# Patient Record
Sex: Female | Born: 1965
Health system: Southern US, Community
[De-identification: ages and names within clinical notes are randomized; demographics above are authoritative.]

## PROBLEM LIST (undated history)

## (undated) ENCOUNTER — Emergency Department (HOSPITAL_COMMUNITY): Admission: EM | Disposition: A | Discharge status: Other Acute Inpatient Hospital | Payer: Self-pay

## (undated) DIAGNOSIS — I619 Nontraumatic intracerebral hemorrhage, unspecified: Secondary | ICD-10-CM

## (undated) DIAGNOSIS — I2699 Other pulmonary embolism without acute cor pulmonale: Secondary | ICD-10-CM

## (undated) DIAGNOSIS — I10 Essential (primary) hypertension: Secondary | ICD-10-CM

## (undated) DIAGNOSIS — R51 Headache: Secondary | ICD-10-CM

## (undated) DIAGNOSIS — B370 Candidal stomatitis: Secondary | ICD-10-CM

## (undated) DIAGNOSIS — B3781 Candidal esophagitis: Principal | ICD-10-CM

## (undated) DIAGNOSIS — M1990 Unspecified osteoarthritis, unspecified site: Secondary | ICD-10-CM

## (undated) DIAGNOSIS — C9100 Acute lymphoblastic leukemia not having achieved remission: Secondary | ICD-10-CM

## (undated) DIAGNOSIS — J189 Pneumonia, unspecified organism: Secondary | ICD-10-CM

## (undated) DIAGNOSIS — C9590 Leukemia, unspecified not having achieved remission: Secondary | ICD-10-CM

## (undated) DIAGNOSIS — E119 Type 2 diabetes mellitus without complications: Secondary | ICD-10-CM

## (undated) DIAGNOSIS — D649 Anemia, unspecified: Secondary | ICD-10-CM

## (undated) DIAGNOSIS — L982 Febrile neutrophilic dermatosis [Sweet]: Secondary | ICD-10-CM

## (undated) HISTORY — DX: Candidal stomatitis: B37.0

## (undated) HISTORY — PX: OTHER SURGICAL HISTORY: SHX169

## (undated) HISTORY — PX: BONE MARROW TRANSPLANT: SHX200

## (undated) HISTORY — DX: Candidal esophagitis: B37.81

## (undated) HISTORY — DX: Type 2 diabetes mellitus without complications: E11.9

## (undated) HISTORY — DX: Febrile neutrophilic dermatosis (sweet): L98.2

## (undated) HISTORY — PX: FOOT SURGERY: SHX648

---

## 1998-05-08 ENCOUNTER — Inpatient Hospital Stay (HOSPITAL_COMMUNITY): Admission: AD | Admit: 1998-05-08 | Discharge: 1998-05-08 | Payer: Self-pay | Admitting: Obstetrics and Gynecology

## 1998-05-12 ENCOUNTER — Ambulatory Visit (HOSPITAL_COMMUNITY): Admission: RE | Admit: 1998-05-12 | Discharge: 1998-05-12 | Payer: Self-pay | Admitting: Obstetrics and Gynecology

## 1998-06-09 ENCOUNTER — Other Ambulatory Visit: Admission: RE | Admit: 1998-06-09 | Discharge: 1998-06-09 | Payer: Self-pay | Admitting: Obstetrics and Gynecology

## 1998-08-05 ENCOUNTER — Encounter: Payer: Self-pay | Admitting: Obstetrics and Gynecology

## 1998-08-05 ENCOUNTER — Ambulatory Visit (HOSPITAL_COMMUNITY): Admission: RE | Admit: 1998-08-05 | Discharge: 1998-08-05 | Payer: Self-pay | Admitting: Obstetrics and Gynecology

## 1998-10-05 ENCOUNTER — Inpatient Hospital Stay (HOSPITAL_COMMUNITY): Admission: AD | Admit: 1998-10-05 | Discharge: 1998-10-05 | Payer: Self-pay | Admitting: Obstetrics and Gynecology

## 1998-11-18 ENCOUNTER — Other Ambulatory Visit: Admission: RE | Admit: 1998-11-18 | Discharge: 1998-11-18 | Payer: Self-pay | Admitting: Obstetrics and Gynecology

## 1998-12-28 ENCOUNTER — Inpatient Hospital Stay (HOSPITAL_COMMUNITY): Admission: AD | Admit: 1998-12-28 | Discharge: 1998-12-31 | Payer: Self-pay | Admitting: *Deleted

## 1999-02-17 ENCOUNTER — Other Ambulatory Visit: Admission: RE | Admit: 1999-02-17 | Discharge: 1999-02-17 | Payer: Self-pay | Admitting: Obstetrics & Gynecology

## 2000-03-31 ENCOUNTER — Encounter: Payer: Self-pay | Admitting: Emergency Medicine

## 2000-03-31 ENCOUNTER — Emergency Department (HOSPITAL_COMMUNITY): Admission: EM | Admit: 2000-03-31 | Discharge: 2000-03-31 | Payer: Self-pay | Admitting: Emergency Medicine

## 2000-07-14 ENCOUNTER — Emergency Department (HOSPITAL_COMMUNITY): Admission: EM | Admit: 2000-07-14 | Discharge: 2000-07-14 | Payer: Self-pay

## 2001-07-09 ENCOUNTER — Other Ambulatory Visit: Admission: RE | Admit: 2001-07-09 | Discharge: 2001-07-09 | Payer: Self-pay | Admitting: Obstetrics and Gynecology

## 2001-07-24 ENCOUNTER — Encounter: Payer: Self-pay | Admitting: Obstetrics and Gynecology

## 2001-07-24 ENCOUNTER — Ambulatory Visit (HOSPITAL_COMMUNITY): Admission: RE | Admit: 2001-07-24 | Discharge: 2001-07-24 | Payer: Self-pay | Admitting: Obstetrics and Gynecology

## 2003-01-31 HISTORY — PX: EYE SURGERY: SHX253

## 2003-03-31 ENCOUNTER — Emergency Department (HOSPITAL_COMMUNITY): Admission: AD | Admit: 2003-03-31 | Discharge: 2003-03-31 | Payer: Self-pay | Admitting: Family Medicine

## 2003-04-03 ENCOUNTER — Ambulatory Visit (HOSPITAL_BASED_OUTPATIENT_CLINIC_OR_DEPARTMENT_OTHER): Admission: RE | Admit: 2003-04-03 | Discharge: 2003-04-03 | Payer: Self-pay | Admitting: Ophthalmology

## 2003-04-14 ENCOUNTER — Other Ambulatory Visit: Admission: RE | Admit: 2003-04-14 | Discharge: 2003-04-14 | Payer: Self-pay | Admitting: Obstetrics and Gynecology

## 2004-09-19 ENCOUNTER — Other Ambulatory Visit: Admission: RE | Admit: 2004-09-19 | Discharge: 2004-09-19 | Payer: Self-pay | Admitting: Obstetrics and Gynecology

## 2004-09-21 ENCOUNTER — Ambulatory Visit (HOSPITAL_COMMUNITY): Admission: RE | Admit: 2004-09-21 | Discharge: 2004-09-21 | Payer: Self-pay | Admitting: Obstetrics and Gynecology

## 2005-05-22 ENCOUNTER — Emergency Department (HOSPITAL_COMMUNITY): Admission: EM | Admit: 2005-05-22 | Discharge: 2005-05-22 | Payer: Self-pay | Admitting: Family Medicine

## 2005-05-24 ENCOUNTER — Emergency Department (HOSPITAL_COMMUNITY): Admission: EM | Admit: 2005-05-24 | Discharge: 2005-05-24 | Payer: Self-pay | Admitting: Emergency Medicine

## 2005-09-20 ENCOUNTER — Ambulatory Visit (HOSPITAL_COMMUNITY): Admission: RE | Admit: 2005-09-20 | Discharge: 2005-09-20 | Payer: Self-pay | Admitting: Orthopedic Surgery

## 2005-10-04 ENCOUNTER — Ambulatory Visit (HOSPITAL_BASED_OUTPATIENT_CLINIC_OR_DEPARTMENT_OTHER): Admission: RE | Admit: 2005-10-04 | Discharge: 2005-10-04 | Payer: Self-pay | Admitting: Orthopedic Surgery

## 2005-12-12 ENCOUNTER — Other Ambulatory Visit: Admission: RE | Admit: 2005-12-12 | Discharge: 2005-12-12 | Payer: Self-pay | Admitting: Obstetrics and Gynecology

## 2005-12-13 ENCOUNTER — Ambulatory Visit (HOSPITAL_COMMUNITY): Admission: RE | Admit: 2005-12-13 | Discharge: 2005-12-13 | Payer: Self-pay | Admitting: Obstetrics and Gynecology

## 2006-02-05 ENCOUNTER — Ambulatory Visit (HOSPITAL_COMMUNITY): Admission: RE | Admit: 2006-02-05 | Discharge: 2006-02-05 | Payer: Self-pay | Admitting: Gastroenterology

## 2006-09-17 ENCOUNTER — Emergency Department (HOSPITAL_COMMUNITY): Admission: EM | Admit: 2006-09-17 | Discharge: 2006-09-17 | Payer: Self-pay | Admitting: Emergency Medicine

## 2007-03-06 ENCOUNTER — Ambulatory Visit (HOSPITAL_COMMUNITY): Admission: RE | Admit: 2007-03-06 | Discharge: 2007-03-06 | Payer: Self-pay | Admitting: Obstetrics and Gynecology

## 2007-04-30 ENCOUNTER — Emergency Department (HOSPITAL_COMMUNITY): Admission: EM | Admit: 2007-04-30 | Discharge: 2007-04-30 | Payer: Self-pay | Admitting: Family Medicine

## 2007-11-29 ENCOUNTER — Ambulatory Visit (HOSPITAL_COMMUNITY): Admission: RE | Admit: 2007-11-29 | Discharge: 2007-11-29 | Payer: Self-pay | Admitting: Cardiology

## 2007-12-02 ENCOUNTER — Ambulatory Visit (HOSPITAL_COMMUNITY): Admission: RE | Admit: 2007-12-02 | Discharge: 2007-12-02 | Payer: Self-pay | Admitting: Cardiology

## 2008-02-12 ENCOUNTER — Emergency Department (HOSPITAL_COMMUNITY): Admission: EM | Admit: 2008-02-12 | Discharge: 2008-02-12 | Payer: Self-pay | Admitting: Family Medicine

## 2009-09-27 ENCOUNTER — Emergency Department (HOSPITAL_COMMUNITY): Admission: EM | Admit: 2009-09-27 | Discharge: 2009-09-27 | Payer: Self-pay | Admitting: Family Medicine

## 2010-06-17 NOTE — Op Note (Signed)
[facility] [place] [facility]  Patient:    Lynn [name]                       MRN: [id number] Proc. Date: 12/28/98 Adm. Date:  [id number] Attending:  Pleas Koch                           Operative Report  PREOPERATIVE DIAGNOSIS:       Persistent late decellerations.  POSTOPERATIVE DIAGNOSIS:      Persistent late decellerations.  OPERATION:                    Low transverse cesarean section.  SURGEON:                      Cecilio Asper, [name]  ASSISTANT:                    Miguel Dibble, C.N.M.  ANESTHESIA:                   Epidural.  ESTIMATED BLOOD LOSS:         800 cc.  URINE OUTPUT:                 350 cc.  FINDINGS:                     A viable female infant, Apgars 9 and 9, weighing 9 pounds.  Normal tubes and ovaries.  COMPLICATIONS:                Small extension of the uterine incision into the lower uterine segment.  INDICATIONS:                  The patient is a 45 year old, gravida 2, para 1, ho presented with spontaneous rupture of membranes.  The patient was augmented with Pitocin.  She reached dilatation of 7 cm and the fetal heart rate tracing showed persistent late decellerations which did not resolve with amnioinfusion, oxygen, changing maternal position, or increasing IV fluids.  The patient was therefore  consented for cesarean delivery.  DESCRIPTION OF PROCEDURE:     After adequate level of epidural anesthesia was obtained, the patient was prepped and draped in the usual sterile fashion. Foley catheter had been previously placed in the bladder to drain it of urine.  A low  transverse incision was made down through the subcutaneous fat to the fascia and the fascia was incised on either side of the midline.  This incision was carried laterally in both directions with the Mayo scissors.  Kocher clamps were placed on the superior edge of the incision and the fascia was removed from the rectus muscles both  superiorly and inferiorly.  The parietoperitoneum was identified by blunt dissection and elevated with hemostats.  It was incised and extended with  blunt dissection.  The bladder piece was placed inside of the incision.  The visceroperitoneum of the lower uterine segment was incised in order to develop  bladder flap.  The bladder piece was placed inside of this flap.  The uterus was entered in a low transverse cesarean fashion and extended with the bandage scissors.  The infants head was elevated and with assistance from the vacuum, it was delivered.  The infant was suctioned on the operative field.  Anterior and posterior shoulders as well as the rest of the body  were then delivered.  The cord was doubly clamped and cut and the infant was taken to the warmer where he was cared for by the nursing team.  Cord bloods were obtained.  The placenta was manually extracted.  The uterus was wiped clean with a wet lap sponge.  Ring clamps were placed on the uterine incision.  There was noted to be an extension toward the left lower uterine segment of the incision, it was reapproximated with a running suture of #0 Vicryl and a second imbricating layer was used with good hemostatic result.  The uterine incision was then reapproximated with a running suture of 0 Vicryl and imbricated in like fashion.  There were a few sinus bleeders of the usx that were made hemostatic with figure-of-eight sutures of #0 Vicryl. Hemostasis was then assured.  The pelvis was copiously irrigated.  Tubes and ovaries were noted to be normal.  The pyramidalis muscles were reapproximated in the midline  with a figure-of-eight suture of #1 Vicryl. The fascia was then reapproximated rom the lateral edge to the midline with a running suture of #1 Vicryl.  Skin staples were applied.  Bandage was applied.  Sponge, needle, and instrument counts were  correct x 2.  The patient tolerated the procedure well.  She was  taken to the recovery room in stable condition. DD:  12/28/98 TD:  12/29/98 Job: [id number]

## 2010-06-17 NOTE — Op Note (Signed)
NAME:  Lynn [name], Lynn [name] NO.:  [id number]   MEDICAL RECORD NO.:  [id number]          PATIENT TYPE:  AMB   LOCATION:  DSC                          FACILITY:  [facility]   PHYSICIAN:  Dyke Brackett, [name]    DATE OF BIRTH:  28-Jun-1965   DATE OF PROCEDURE:  10/04/2005  DATE OF DISCHARGE:                                 OPERATIVE REPORT   PREOPERATIVE DIAGNOSIS:  Osteochondritis, left ankle.   POSTOPERATIVE DIAGNOSIS:  Osteochondritis, left ankle.   OPERATIONS:  1. Synovectomy.  2. Removal of osteochondral fragment.  3. Drilling/microfracture of left ankle.   SURGEON:  Dyke Brackett, [name]   ASSISTANT:  Legrand Pitts. Duffy, P.A.   TOURNIQUET TIME:  45 minutes.   INDICATIONS:  The patient is a 45 year old female with a painful ankle after  a sprain for several months.  Conservative treatment did not improve her  situation.  She had an MRI, which showed an osteochondritis along the medial  portion of the talus that needed surgery, thought to be accomplishable as an  outpatient.   DESCRIPTION OF PROCEDURE:  Leg holder on the calf, with exsanguination of  the leg and inflation of the thigh tourniquet to 375, portals were created  anteromedially and anterolaterally.  There was probably a 5 x 5-mm  osteochondral fragment, which was loose.  It did not have any subchondral  bone attached to it; therefore, it was debrided, and then the base of the  lesion, which really did involve more articular cartilage than the  subchondral bone, was microfractured with the microfracture instrument.  There was mild nonspecific synovitis surrounding the lesion.  The lesion was  probably in the midportion of the talus on the medial side, but I would say  that over 50% of it was nonweightbearing, abutting the medial malleolus.  The portals were closed.  The joint and portals were infiltrated with  Marcaine, and then a compressive dressing and a boot.  Taken to the recovery  room in stable  condition.  The tourniquet was released after application of  the dressing, before application of the boot.      Dyke Brackett, [name]  Electronically Signed     WDC/MEDQ  D:  10/04/2005  T:  10/04/2005  Job:  [id number]

## 2010-06-17 NOTE — Op Note (Signed)
NAME:  Lynn [name], Lynn [name] NO.:  [id number]   MEDICAL RECORD NO.:  [id number]          PATIENT TYPE:  AMB   LOCATION:  ENDO                         FACILITY:  [facility]   PHYSICIAN:  Shirley Friar, MDDATE OF BIRTH:  1965/06/24   DATE OF PROCEDURE:  DATE OF DISCHARGE:                               OPERATIVE REPORT   PROCEDURE:  Colonoscopy.   INDICATIONS FOR PROCEDURE:  Screening, family history of colon cancer in  her mother the age 46.   MEDICATIONS:  Fentanyl 100 mcg IV, Versed 8 mg IV.   FINDINGS:  Rectal exam was normal.  A pediatric colonoscope was inserted  into an adequately prepped colon and advanced to the cecum where the  ileocecal valve and appendiceal orifice were identified.  Careful  withdrawal of the colonoscope revealed no mucosal abnormalities,  including no polyps or diverticula.  Retroflexion revealed small  internal hemorrhoids.   ASSESSMENT:  Small internal hemorrhoids; otherwise, normal colonoscopy.   PLAN:  1. Repeat colonoscopy in 5 years due to family history of colon      cancer.  2. High-fiber diet.      Shirley Friar, MD  Electronically Signed     VCS/MEDQ  D:  02/05/2006  T:  02/05/2006  Job:  [id number]   cc:   Hal Morales, [name]

## 2010-06-17 NOTE — Op Note (Signed)
NAME:  Lynn [name], Lynn [name]                         ACCOUNT NO.:  [id number]   MEDICAL RECORD NO.:  [id number]                   PATIENT TYPE:  AMB   LOCATION:  DSC                                  FACILITY:  [facility]   PHYSICIAN:  Pasty Spillers. Maple Hudson, [name]              DATE OF BIRTH:  [birth year]   DATE OF PROCEDURE:  04/03/2003  DATE OF DISCHARGE:                                 OPERATIVE REPORT   PREOPERATIVE DIAGNOSIS:  Exotropia, status post eye muscle surgery on the  left eye.   POSTOPERATIVE DIAGNOSIS:  Exotropia, status post eye muscle surgery on the  left eye.   PROCEDURE:  1. Right lateral rectus muscle recession, 7.5 mm.  2. Right medial rectus muscle resection, 7.5 mm.   SURGEON:  Pasty Spillers. Maple Hudson, [name]   ANESTHESIA:  General, (laryngeal mask).   COMPLICATIONS:  None.   DESCRIPTION OF PROCEDURE:  After routine preoperative evaluation including  informed consent, the patient was taken to the operating room where she was  identified by me.  General anesthesia was induced without difficulty after  placement of appropriate monitors.  The patient was prepped and draped in a  standard sterile fashion.  The lid speculum was placed in the right eye.   Through an inferotemporal fornix incision through conjunctiva and tenons  fascia, the right lateral rectus muscle was engaged on a series of muscle  hooks and cleared of its fascial attachments.  The tendon was secured with a  double-armed 6-0 Vicryl suture, the double locking bite at each border of  the muscle, 1 mm from the insertion.  The muscle was dysinserted, and was  reattached to the sclera at a measured distance of 7.5 mm posterior to the  original incision, using direct scleral vastus in crossed swords fashion.  The suture ends were tied securely after the position of the muscle had been  checked and found to be adequate.  The conjunctiva was closed with two  interrupted 6-0 plain gut sutures.   Through an inferonasal  fornix incision, the conjunctiva and tenons were  fashioned where the right medial and rectus muscle was engaged and cleared  as described for the lateral, except that the clearing of the fascia was  more extensive and proceeded farther posterior for the medial.  The muscle  was spread between two self-retaining hooks.  A 2 mm bite was taken of the  center of the muscle belly with a double-armed 6-0 Vicryl suture, and a knot  was tied securely at this location.  The needle at each end of the double-  armed suture was passed from the center of the muscle belly to the  periphery, parallel to an 7.5 mm posterior to the insertion.  A double  locking bite was placed on the muscle, a double locking bite was placed at  each border of the muscle.  A resection clamp was placed on the muscle just  anterior to these sutures.  The muscle was dysinserted.  Each pole suture  was passed posteriorly to anteriorly through the periphery of the muscle  stump, then anteriorly with the posterior needle in the center of the stump,  then posteriorly to anteriorly through the center of the muscle belly, just  posterior to the previously placed knot.  The muscle was drawn up to the  level of the original insertion, and all slack was removed before these  suture ends were tied securely.  The clamp was removed, and the portion of  the muscle anterior to the sutures was carefully  excised.  The conjunctiva was closed with two interrupted 6-0 plain gut  sutures.  TobraDex ointment was placed in the eye.  The patient was awakened  without difficulty and taken to the recovery room in stable condition,  having suffered no intraoperative or immediate postoperative complications.                                               Pasty Spillers. Maple Hudson, [name]    Cheron Schaumann  D:  04/03/2003  T:  04/04/2003  Job:  [id number]

## 2010-06-17 NOTE — Op Note (Signed)
NAME:  TARYNE, KIGER NO.:  [id number]   MEDICAL RECORD NO.:  [id number]          PATIENT TYPE:  AMB   LOCATION:  DSC                          FACILITY:  [facility]   PHYSICIAN:  Dyke Brackett, [name]    DATE OF BIRTH:  [birth year]   DATE OF PROCEDURE:  10/04/2005  DATE OF DISCHARGE:  10/04/2005                               OPERATIVE REPORT   INDICATIONS:  The patient had intractable pain after an ankle sprain and  was noted on MRI to have osteochondritis thought to be amenable to  outpatient surgery of the ankle.   PREOPERATIVE DIAGNOSIS:  Osteochondritis, anteromedial talus with  synovitis.   POSTOPERATIVE DIAGNOSIS:  Osteochondritis, anteromedial talus with  synovitis.   OPERATION:  Debridement and synovectomy, left ankle, with excision of  osteochondral fragment and a microfracture.   SURGEON:  Dyke Brackett, [name]   ASSISTANT:  PA.   DESCRIPTION OF THE PROCEDURE:  Sterile prep and drape.  Exsanguination  of the leg and placement to 375 thigh tourniquet.  A holder was used on  the calf.  Anteromedial and anterolateral portals were created.  Systematic inspection of the ankle showed synovitis that was most  noticeable over the anteromedial aspect of ankle.  This was overlying a  loose osteochondral fracture fragment that probably measured 5 x 7 mm  involving some of the dome of the talus around the curve.  Certainly  probably half the lesion was not on the weightbearing surface.  This was  a loose fragment requiring debridement.  The bed was punctured with  several areas for a microfracture technique as well.  No other  abnormalities noted.  Ankle drained free from fluid, forced closed.  Placed in a large compressive sterile dressing and splint and the  portals and joint infiltrated with Marcaine.  Taken to the recovery room  in stable condition.      Dyke Brackett, [name]  Electronically Signed     WDC/MEDQ  D:  12/05/2005  T:  12/06/2005  Job:   [id number]

## 2010-10-24 LAB — POCT I-STAT, CHEM 8
Calcium, Ion: 1.21
Creatinine, Ser: 0.9
Glucose, Bld: 92
Hemoglobin: 12.9
Sodium: 142
TCO2: 29

## 2010-10-24 LAB — POCT URINALYSIS DIP (DEVICE)
Glucose, UA: NEGATIVE
Hgb urine dipstick: NEGATIVE
Protein, ur: NEGATIVE
Urobilinogen, UA: 0.2

## 2010-10-24 LAB — POCT PREGNANCY, URINE: Operator id: 235561

## 2011-02-13 ENCOUNTER — Encounter (HOSPITAL_COMMUNITY): Payer: Self-pay

## 2011-02-13 ENCOUNTER — Emergency Department (HOSPITAL_COMMUNITY)
Admission: EM | Admit: 2011-02-13 | Discharge: 2011-02-13 | Disposition: A | Discharge status: Home / Self Care | Payer: 59 | Source: Home / Self Care

## 2011-02-13 DIAGNOSIS — R6889 Other general symptoms and signs: Secondary | ICD-10-CM

## 2011-02-13 MED ORDER — IBUPROFEN 600 MG PO TABS: 600.0000 mg | ORAL_TABLET | Freq: Four times a day (QID) | ORAL | Status: AC | PRN

## 2011-02-13 NOTE — ED Provider Notes (Signed)
Medical screening examination/treatment/procedure(s) were performed by non-physician practitioner and as supervising physician I was immediately available for consultation/collaboration.  , M. MD    M , MD 02/13/11 1957 

## 2011-02-13 NOTE — ED Notes (Addendum)
C/o fever, chills, cough, dizziness, body aches and nausea.  Denies nasal sx, or vomiting.  Reports some diarrhea yesterday.  Sx started on Saturday

## 2011-02-13 NOTE — ED Provider Notes (Signed)
History     CSN: [id number]  Arrival date & time 02/13/11  4098   None     Chief Complaint  Patient presents with  . Influenza    (Consider location/radiation/quality/duration/timing/severity/associated sxs/prior treatment) HPI Comments: Pt states she was out to eat 2 days ago (Saturday afternoon) when she suddenly developed the chills. When she got home she checked her temp and her fever was 102. She has HA, body aches and chills primarily and symptoms are relieved with Ibuprofen 600mg  but return when it wears off. She has some cough and is productive with yellow phlegm. No nasal congestion or sore throat. She has been nauseated and had loose stools yesterday. No vomiting. Appetite is decreased. This morning she was lightheaded when she got out of bed.  Pt states she has been exposed to family members with strep throat and influenza.    History reviewed. No pertinent past medical history.  Past Surgical History  Procedure Date  . Cesarean section   . Foot surgery     History reviewed. No pertinent family history.  History  Substance Use Topics  . Smoking status: Never Smoker   . Smokeless tobacco: Not on file  . Alcohol Use: No    OB History    Grav Para Term Preterm Abortions TAB SAB Ect Mult Living                  Review of Systems  Constitutional: Positive for fever, chills, appetite change and fatigue.  HENT: Negative for ear pain, sore throat, rhinorrhea, sneezing, neck pain, postnasal drip and sinus pressure.   Respiratory: Positive for cough. Negative for shortness of breath and wheezing.   Cardiovascular: Negative for chest pain and palpitations.  Gastrointestinal: Positive for nausea and diarrhea. Negative for vomiting, abdominal pain and constipation.  Genitourinary: Negative for dysuria, frequency and decreased urine volume.  Musculoskeletal: Positive for myalgias.  Neurological: Positive for light-headedness and headaches. Negative for dizziness.     Allergies  Review of patient's allergies indicates no known allergies.  Home Medications   Current Outpatient Rx  Name Route Sig Dispense Refill  . IBUPROFEN 600 MG PO TABS Oral Take 1 tablet (600 mg total) by mouth every 6 (six) hours as needed for pain or fever. 15 tablet 0    BP 130/77  Pulse 105  Temp(Src) 99.3 F (37.4 C) (Oral)  Resp 18  SpO2 99%  LMP 01/12/2011  Physical Exam  Nursing note and vitals reviewed. Constitutional: She appears well-developed and well-nourished. No distress.  HENT:  Head: Normocephalic and atraumatic.  Right Ear: Tympanic membrane, external ear and ear canal normal.  Left Ear: Tympanic membrane, external ear and ear canal normal.  Nose: Nose normal.  Mouth/Throat: Uvula is midline, oropharynx is clear and moist and mucous membranes are normal. No oropharyngeal exudate, posterior oropharyngeal edema or posterior oropharyngeal erythema.  Neck: Neck supple.  Cardiovascular: Normal rate, regular rhythm and normal heart sounds.   Pulmonary/Chest: Effort normal and breath sounds normal. No respiratory distress.  Abdominal: Normal appearance and bowel sounds are normal. She exhibits no mass. There is no hepatosplenomegaly. There is no tenderness. There is no CVA tenderness.  Lymphadenopathy:    She has no cervical adenopathy.  Neurological: She is alert.  Skin: Skin is warm and dry.  Psychiatric: She has a normal mood and affect.    ED Course  Procedures (including critical care time)  Labs Reviewed - No data to display No results found.   1. Flu-like  symptoms       MDM  Viral symptoms and fever - onset 2 days ago. Exam neg.         Melody Comas, [place] 02/13/11 [phone]

## 2011-02-16 ENCOUNTER — Emergency Department (INDEPENDENT_AMBULATORY_CARE_PROVIDER_SITE_OTHER): Payer: 59

## 2011-02-16 ENCOUNTER — Encounter (HOSPITAL_COMMUNITY): Payer: Self-pay | Admitting: *Deleted

## 2011-02-16 ENCOUNTER — Emergency Department (HOSPITAL_COMMUNITY)
Admission: EM | Admit: 2011-02-16 | Discharge: 2011-02-16 | Disposition: A | Discharge status: Home / Self Care | Payer: 59 | Source: Home / Self Care | Attending: Emergency Medicine | Admitting: Emergency Medicine

## 2011-02-16 DIAGNOSIS — J189 Pneumonia, unspecified organism: Secondary | ICD-10-CM

## 2011-02-16 MED ORDER — AZITHROMYCIN 250 MG PO TABS: 250.0000 mg | ORAL_TABLET | Freq: Every day | ORAL | Status: AC

## 2011-02-16 MED ORDER — HYDROCODONE-ACETAMINOPHEN 7.5-500 MG/15ML PO SOLN: 5.0000 mL | Freq: Four times a day (QID) | ORAL | Status: AC | PRN

## 2011-02-16 MED ORDER — IPRATROPIUM BROMIDE 0.02 % IN SOLN
0.5000 mg | Freq: Once | RESPIRATORY_TRACT | Status: AC
  Administered 2011-02-16: 0.5 mg via RESPIRATORY_TRACT

## 2011-02-16 MED ORDER — ALBUTEROL SULFATE (5 MG/ML) 0.5% IN NEBU
5.0000 mg | INHALATION_SOLUTION | Freq: Once | RESPIRATORY_TRACT | Status: AC
  Administered 2011-02-16: 5 mg via RESPIRATORY_TRACT

## 2011-02-16 MED ORDER — ALBUTEROL SULFATE (5 MG/ML) 0.5% IN NEBU
INHALATION_SOLUTION | RESPIRATORY_TRACT | Status: AC
  Filled 2011-02-16: qty 1

## 2011-02-16 MED ORDER — ALBUTEROL SULFATE HFA 108 (90 BASE) MCG/ACT IN AERS: 2.0000 | INHALATION_SPRAY | RESPIRATORY_TRACT | Status: DC | PRN

## 2011-02-16 NOTE — ED Provider Notes (Signed)
History     CSN: [id number]  Arrival date & time 02/16/11  4098   First MD Initiated Contact with Patient 02/16/11 [phone]      Chief Complaint  Patient presents with  . Cough    (Consider location/radiation/quality/duration/timing/severity/associated sxs/prior treatment) HPI Comments: Patient with flulike symptoms starting about 5 days ago. Complaining of body aches, fevers, rhinorrhea, sore throat, coughing. These symptoms have largely resolved, however, patient with worsening cough, chest congestion, chest tightness, mild shortness of breath. Wheezing worse at night, unable to sleep due to all of the coughing. Also complains of continued fevers, MAXIMUM TEMPERATURE 101.5 yesterday despite 600 milligrams of Motrin 4 times a day. Also taking Delsym for cough relief. No history of asthma, bronchitis, pneumonia, COPD. Patient is [employer] employee and got her flu shot this year. Insight and  ROS as noted in HPI. All other ROS negative.   The history is provided by the patient.    History reviewed. No pertinent past medical history.  Past Surgical History  Procedure Date  . Cesarean section   . Foot surgery     Family History  Problem Relation Age of Onset  . Cancer Father     History  Substance Use Topics  . Smoking status: Never Smoker   . Smokeless tobacco: Not on file  . Alcohol Use: No    OB History    Grav Para Term Preterm Abortions TAB SAB Ect Mult Living                  Review of Systems  Allergies  Review of patient's allergies indicates no known allergies.  Home Medications   Current Outpatient Rx  Name Route Sig Dispense Refill  . IBUPROFEN 600 MG PO TABS Oral Take 1 tablet (600 mg total) by mouth every 6 (six) hours as needed for pain or fever. 15 tablet 0  . ALBUTEROL SULFATE HFA 108 (90 BASE) MCG/ACT IN AERS Inhalation Inhale 2 puffs into the lungs every 4 (four) hours as needed for wheezing or shortness of breath. 3.7 g 0  . AZITHROMYCIN 250 MG  PO TABS Oral Take 1 tablet (250 mg total) by mouth daily. 2 tabs po on day 1, 1 tab po on days 2-5 6 tablet 0  . HYDROCODONE-ACETAMINOPHEN 7.5-500 MG/15ML PO SOLN Oral Take 5 mLs by mouth every 6 (six) hours as needed for pain. 120 mL 0    BP 115/68  Pulse 89  Temp(Src) 98.3 F (36.8 C) (Oral)  Resp 20  SpO2 100%  LMP 01/12/2011  Physical Exam  Nursing note and vitals reviewed. Constitutional: She is oriented to person, place, and time. She appears well-developed and well-nourished. No distress.  HENT:  Head: Normocephalic and atraumatic.  Eyes: Conjunctivae and EOM are normal.  Neck: Normal range of motion. Neck supple.  Cardiovascular: Normal rate, regular rhythm and normal heart sounds.   Pulmonary/Chest: Effort normal and breath sounds normal. No respiratory distress. She has no wheezes. She exhibits no tenderness.       ronchi LUL clears with coughing. Coughing.  Abdominal: Soft. Bowel sounds are normal. She exhibits no distension. There is no tenderness.  Musculoskeletal: Normal range of motion.  Lymphadenopathy:    She has no cervical adenopathy.  Neurological: She is alert and oriented to person, place, and time.  Skin: Skin is warm and dry.  Psychiatric: She has a normal mood and affect. Her behavior is normal. Judgment and thought content normal.    ED Course  Procedures (including critical care time)  Labs Reviewed - No data to display Dg Chest 2 View  02/16/2011  *RADIOLOGY REPORT*  Clinical Data: Flu-like symptoms, cough  CHEST - 2 VIEW  Comparison: None.  Findings: Cardiomediastinal silhouette is unremarkable.  No acute infiltrate or pleural effusion.  No pulmonary edema.  Minimal perihilar increased bronchial markings without focal consolidation.  IMPRESSION: No acute infiltrate or pulmonary edema.  Minimal perihilar increased bronchial markings without focal consolidation.  Original Report Authenticated By: Natasha Mead, [name]     1. CAP (community acquired  pneumonia)       MDM  Previous records reviewed. seen in Department several days ago for flulike symptoms was thought to have flulike illness. Home with supportive care.  Pt seen and examined. Pt speaking in full sentences but has ronchi LUL, Pt given albuterol/atrovent,  Will do CXR because of occ ronchi in addition to persistent fevers. Will re-evaluate. Imaging reviewed by myself. Report per radiologist.  patient with clinical pneumonia despite negative chest x-ray. Sending him with azithromycin and bronchodilators, antitussives. Discussed the imaging results, and plan plan with patient who agrees. Patient to followup with PMD as needed.   Luiz Blare, MD 02/16/11 [phone]

## 2011-02-16 NOTE — ED Notes (Signed)
Onset 5 days ago fever/chills and dry cough.   She had a temp of 101.5 yesterday  Today c/o sore throat.

## 2011-06-17 ENCOUNTER — Emergency Department (HOSPITAL_COMMUNITY)
Admission: EM | Admit: 2011-06-17 | Discharge: 2011-06-17 | Disposition: A | Discharge status: Nursing Home (Includes ICF) | Payer: 59 | Source: Home / Self Care | Attending: Emergency Medicine | Admitting: Emergency Medicine

## 2011-06-17 ENCOUNTER — Encounter (HOSPITAL_COMMUNITY): Payer: Self-pay | Admitting: *Deleted

## 2011-06-17 ENCOUNTER — Inpatient Hospital Stay (HOSPITAL_COMMUNITY)
Admission: EM | Admit: 2011-06-17 | Discharge: 2011-06-21 | DRG: 176 | Disposition: A | Discharge status: Other Acute Inpatient Hospital | Payer: 59 | Attending: Internal Medicine | Admitting: Internal Medicine

## 2011-06-17 ENCOUNTER — Emergency Department (HOSPITAL_COMMUNITY): Payer: 59

## 2011-06-17 ENCOUNTER — Encounter (HOSPITAL_COMMUNITY): Payer: Self-pay | Admitting: Emergency Medicine

## 2011-06-17 DIAGNOSIS — D630 Anemia in neoplastic disease: Secondary | ICD-10-CM | POA: Diagnosis present

## 2011-06-17 DIAGNOSIS — D72829 Elevated white blood cell count, unspecified: Secondary | ICD-10-CM | POA: Diagnosis present

## 2011-06-17 DIAGNOSIS — R718 Other abnormality of red blood cells: Secondary | ICD-10-CM | POA: Diagnosis present

## 2011-06-17 DIAGNOSIS — I10 Essential (primary) hypertension: Secondary | ICD-10-CM | POA: Diagnosis present

## 2011-06-17 DIAGNOSIS — E119 Type 2 diabetes mellitus without complications: Secondary | ICD-10-CM | POA: Diagnosis present

## 2011-06-17 DIAGNOSIS — R599 Enlarged lymph nodes, unspecified: Secondary | ICD-10-CM

## 2011-06-17 DIAGNOSIS — M311 Thrombotic microangiopathy: Secondary | ICD-10-CM

## 2011-06-17 DIAGNOSIS — M79609 Pain in unspecified limb: Secondary | ICD-10-CM

## 2011-06-17 DIAGNOSIS — C9100 Acute lymphoblastic leukemia not having achieved remission: Secondary | ICD-10-CM | POA: Diagnosis present

## 2011-06-17 DIAGNOSIS — D649 Anemia, unspecified: Secondary | ICD-10-CM | POA: Diagnosis present

## 2011-06-17 DIAGNOSIS — R590 Localized enlarged lymph nodes: Secondary | ICD-10-CM | POA: Diagnosis present

## 2011-06-17 DIAGNOSIS — I2699 Other pulmonary embolism without acute cor pulmonale: Principal | ICD-10-CM | POA: Diagnosis present

## 2011-06-17 DIAGNOSIS — J45909 Unspecified asthma, uncomplicated: Secondary | ICD-10-CM | POA: Diagnosis present

## 2011-06-17 DIAGNOSIS — E669 Obesity, unspecified: Secondary | ICD-10-CM | POA: Diagnosis present

## 2011-06-17 DIAGNOSIS — M7989 Other specified soft tissue disorders: Secondary | ICD-10-CM

## 2011-06-17 DIAGNOSIS — D696 Thrombocytopenia, unspecified: Secondary | ICD-10-CM | POA: Diagnosis present

## 2011-06-17 DIAGNOSIS — Z6841 Body Mass Index (BMI) 40.0 and over, adult: Secondary | ICD-10-CM

## 2011-06-17 DIAGNOSIS — R112 Nausea with vomiting, unspecified: Secondary | ICD-10-CM

## 2011-06-17 DIAGNOSIS — D7289 Other specified disorders of white blood cells: Secondary | ICD-10-CM

## 2011-06-17 HISTORY — DX: Essential (primary) hypertension: I10

## 2011-06-17 LAB — COMPREHENSIVE METABOLIC PANEL
Albumin: 3.6 g/dL (ref 3.5–5.2)
Alkaline Phosphatase: 100 U/L (ref 39–117)
BUN: 12 mg/dL (ref 6–23)
CO2: 22 mEq/L (ref 19–32)
Chloride: 108 mEq/L (ref 96–112)
GFR calc Af Amer: >90 mL/min (ref >90)
Glucose, Bld: 88 mg/dL (ref 70–99)
Potassium: 4 mEq/L (ref 3.5–5.1)
Total Bilirubin: 0.3 mg/dL (ref 0.3–1.2)

## 2011-06-17 LAB — POCT URINALYSIS DIP (DEVICE)
Bilirubin Urine: NEGATIVE
Ketones, ur: NEGATIVE mg/dL
Protein, ur: 30 mg/dL — AB
pH: 6 (ref 5.0–8.0)

## 2011-06-17 LAB — APTT: aPTT: 34 seconds (ref 24–37)

## 2011-06-17 LAB — POCT I-STAT, CHEM 8
BUN: 13 mg/dL (ref 6–23)
Hemoglobin: 9.9 g/dL — ABNORMAL LOW (ref 12.0–15.0)
TCO2: 25 mmol/L (ref 0–100)

## 2011-06-17 LAB — HEMOGLOBIN A1C: Mean Plasma Glucose: 166 mg/dL — ABNORMAL HIGH (ref <117)

## 2011-06-17 LAB — URINALYSIS, ROUTINE W REFLEX MICROSCOPIC
Bilirubin Urine: NEGATIVE
Nitrite: NEGATIVE
Specific Gravity, Urine: 1.039 — ABNORMAL HIGH (ref 1.005–1.030)
pH: 7 (ref 5.0–8.0)

## 2011-06-17 LAB — CBC
HCT: 25.7 % — ABNORMAL LOW (ref 36.0–46.0)
Hemoglobin: 9 g/dL — ABNORMAL LOW (ref 12.0–15.0)
MCH: 26.1 pg (ref 26.0–34.0)
MCHC: 35 g/dL (ref 30.0–36.0)
MCV: 74.5 fL — ABNORMAL LOW (ref 78.0–100.0)
RDW: 18.7 % — ABNORMAL HIGH (ref 11.5–15.5)

## 2011-06-17 LAB — PROTIME-INR
INR: 0.96 (ref 0.00–1.49)
Prothrombin Time: 13 seconds (ref 11.6–15.2)

## 2011-06-17 LAB — DIFFERENTIAL
Basophils Relative: 0 % (ref 0–1)
Eosinophils Relative: 0 % (ref 0–5)
Promyelocytes Absolute: 0 %
Smear Review: DECREASED
nRBC: 0 /100 WBC

## 2011-06-17 LAB — URINE MICROSCOPIC-ADD ON

## 2011-06-17 LAB — DIC (DISSEMINATED INTRAVASCULAR COAGULATION)PANEL
Fibrinogen: 309 mg/dL (ref 204–475)
Prothrombin Time: 13.4 seconds (ref 11.6–15.2)
aPTT: 28 seconds (ref 24–37)

## 2011-06-17 LAB — RETICULOCYTES: RBC.: 3.29 MIL/uL — ABNORMAL LOW (ref 3.87–5.11)

## 2011-06-17 MED ORDER — ALBUTEROL SULFATE HFA 108 (90 BASE) MCG/ACT IN AERS: 2.0000 | INHALATION_SPRAY | RESPIRATORY_TRACT | Status: DC | PRN

## 2011-06-17 MED ORDER — IOHEXOL 350 MG/ML SOLN
100.0000 mL | Freq: Once | INTRAVENOUS | Status: AC | PRN
  Administered 2011-06-17: 100 mL via INTRAVENOUS

## 2011-06-17 MED ORDER — ONDANSETRON HCL 4 MG PO TABS: 4.0000 mg | ORAL_TABLET | Freq: Four times a day (QID) | ORAL | Status: DC | PRN

## 2011-06-17 MED ORDER — ONDANSETRON HCL 4 MG/2ML IJ SOLN
4.0000 mg | Freq: Four times a day (QID) | INTRAMUSCULAR | Status: DC | PRN
  Administered 2011-06-19: 4 mg via INTRAVENOUS
  Filled 2011-06-17: qty 2

## 2011-06-17 MED ORDER — ENOXAPARIN SODIUM 150 MG/ML ~~LOC~~ SOLN
130.0000 mg | Freq: Two times a day (BID) | SUBCUTANEOUS | Status: DC
  Administered 2011-06-17 – 2011-06-18 (×2): 130 mg via SUBCUTANEOUS
  Filled 2011-06-17 (×5): qty 1

## 2011-06-17 MED ORDER — ACETAMINOPHEN 325 MG PO TABS
650.0000 mg | ORAL_TABLET | Freq: Four times a day (QID) | ORAL | Status: DC | PRN
  Administered 2011-06-19: 650 mg via ORAL
  Filled 2011-06-17: qty 2

## 2011-06-17 MED ORDER — POLYETHYLENE GLYCOL 3350 17 G PO PACK
17.0000 g | PACK | Freq: Every day | ORAL | Status: DC
  Administered 2011-06-21: 17 g via ORAL
  Filled 2011-06-17 (×5): qty 1

## 2011-06-17 MED ORDER — HYDROCODONE-ACETAMINOPHEN 5-325 MG PO TABS
1.0000 | ORAL_TABLET | ORAL | Status: DC | PRN
  Administered 2011-06-17 (×2): 1 via ORAL
  Filled 2011-06-17 (×2): qty 1

## 2011-06-17 MED ORDER — MORPHINE SULFATE 2 MG/ML IJ SOLN
1.0000 mg | INTRAMUSCULAR | Status: DC | PRN
  Administered 2011-06-18 – 2011-06-21 (×5): 1 mg via INTRAVENOUS
  Filled 2011-06-17 (×5): qty 1

## 2011-06-17 MED ORDER — SODIUM CHLORIDE 0.9 % IJ SOLN
3.0000 mL | Freq: Two times a day (BID) | INTRAMUSCULAR | Status: DC
  Administered 2011-06-17 – 2011-06-21 (×7): 3 mL via INTRAVENOUS

## 2011-06-17 MED ORDER — ENOXAPARIN SODIUM 150 MG/ML ~~LOC~~ SOLN: 1.0000 mg/kg | Freq: Two times a day (BID) | SUBCUTANEOUS | Status: DC

## 2011-06-17 MED ORDER — ALBUTEROL SULFATE (5 MG/ML) 0.5% IN NEBU: 2.5000 mg | INHALATION_SOLUTION | RESPIRATORY_TRACT | Status: DC | PRN

## 2011-06-17 MED ORDER — SODIUM CHLORIDE 0.9 % IV SOLN
Freq: Once | INTRAVENOUS | Status: AC
  Administered 2011-06-17: 11:00:00 via INTRAVENOUS

## 2011-06-17 MED ORDER — ACETAMINOPHEN 325 MG PO TABS
650.0000 mg | ORAL_TABLET | Freq: Once | ORAL | Status: AC
  Administered 2011-06-17: 650 mg via ORAL
  Filled 2011-06-17: qty 2

## 2011-06-17 MED ORDER — ACETAMINOPHEN 650 MG RE SUPP: 650.0000 mg | Freq: Four times a day (QID) | RECTAL | Status: DC | PRN

## 2011-06-17 NOTE — ED Provider Notes (Signed)
History     CSN: [id number]  Arrival date & time 06/17/11  1114   First MD Initiated Contact with Patient 06/17/11 1132      Chief Complaint  Patient presents with  . Leg Pain    left leg  . Headache    (Consider location/radiation/quality/duration/timing/severity/associated sxs/prior treatment) HPI Comments: Patient sent from Urgent Care center with complaint of left leg pain and swelling, intermittent SOB, and elevated d-dimer.  Pt reports she began having left leg swelling on Tuesday, and intermittent SOB around the same time.  SOB is worse with any ambulation.  The next day she began having cramping pain in her left calf.  The pain and swelling are both improved with elevation and motrin.  She did have one episode last night of right lip numbness that resolved in 15 minutes - no weakness involved.  Has has also had a "nagging" headache, improved with NSAIDs.  Denies fevers, chest pain, cough, hemoptysis.    Patient is a 46 y.o. female presenting with leg pain and headaches. The history is provided by the patient and medical records.  Leg Pain  Pertinent negatives include no numbness.  Headache  Associated symptoms include shortness of breath. Pertinent negatives include no fever.    Past Medical History  Diagnosis Date  . Hypertension     Past Surgical History  Procedure Date  . Cesarean section   . Foot surgery   . Foot surgery   . Cesarean section   . Eye surgery     Family History  Problem Relation Age of Onset  . Cancer Father     History  Substance Use Topics  . Smoking status: Never Smoker   . Smokeless tobacco: Not on file  . Alcohol Use: No    OB History    Grav Para Term Preterm Abortions TAB SAB Ect Mult Living                  Review of Systems  Constitutional: Negative for fever and chills.  HENT: Negative for sore throat.   Respiratory: Positive for shortness of breath. Negative for cough.   Cardiovascular: Positive for leg swelling.  Negative for chest pain.  Gastrointestinal: Negative for abdominal pain.  Genitourinary: Positive for frequency. Negative for dysuria.  Neurological: Positive for headaches. Negative for syncope, weakness and numbness.    Allergies  Review of patient's allergies indicates no known allergies.  Home Medications   Current Outpatient Rx  Name Route Sig Dispense Refill  . ALBUTEROL SULFATE HFA 108 (90 BASE) MCG/ACT IN AERS Inhalation Inhale 2 puffs into the lungs every 4 (four) hours as needed. For shortness of breath    . IBUPROFEN 200 MG PO TABS Oral Take 600 mg by mouth every 6 (six) hours as needed. For pain    . ALEVE PO Oral Take 1 tablet by mouth daily as needed. For pain      BP 149/95  Pulse 84  Temp(Src) 97.9 F (36.6 C) (Oral)  Resp 20  SpO2 100%  LMP 06/03/2011  Physical Exam  Nursing note and vitals reviewed. Constitutional: She is oriented to person, place, and time. She appears well-developed and well-nourished. No distress.  HENT:  Head: Normocephalic and atraumatic.  Neck: Neck supple.  Cardiovascular: Normal rate and regular rhythm.   Pulmonary/Chest: Effort normal and breath sounds normal. No respiratory distress. She has no wheezes. She has no rales.  Musculoskeletal:       Left ankle: She exhibits swelling. tenderness.  Left lower leg: She exhibits tenderness and swelling.       Left foot: She exhibits tenderness and swelling.       Lower extremities: strength 5/5, sensation intact, distal pulses intact.   Neurological: She is alert and oriented to person, place, and time.  Skin: She is not diaphoretic.  Psychiatric: She has a normal mood and affect. Her behavior is normal.    ED Course  Procedures (including critical care time)  Labs Reviewed  CBC - Abnormal; Notable for the following:    WBC 27.7 (*)    RBC 3.45 (*)    Hemoglobin 9.0 (*)    HCT 25.7 (*)    MCV 74.5 (*)    RDW 18.7 (*)    Platelets 54 (*) PLATELET COUNT CONFIRMED BY SMEAR     All other components within normal limits  COMPREHENSIVE METABOLIC PANEL - Abnormal; Notable for the following:    ALT 42 (*)    All other components within normal limits  URINALYSIS, ROUTINE W REFLEX MICROSCOPIC - Abnormal; Notable for the following:    Specific Gravity, Urine 1.039 (*)    Hgb urine dipstick TRACE (*)    All other components within normal limits  URINE MICROSCOPIC-ADD ON - Abnormal; Notable for the following:    Squamous Epithelial / LPF MANY (*)    All other components within normal limits  DIFFERENTIAL  APTT  PROTIME-INR  PREGNANCY, URINE   Ct Angio Chest W/cm &/or Wo Cm  06/17/2011  *RADIOLOGY REPORT*  Clinical Data: Shortness of breath and elevated D-dimer.  CT ANGIOGRAPHY CHEST  Technique:  Multidetector CT imaging of the chest using the standard protocol during bolus administration of intravenous contrast. Multiplanar reconstructed images including MIPs were obtained and reviewed to evaluate the vascular anatomy.  Contrast: OMNIPAQUE IOHEXOL 350 MG/ML SOLN  Comparison: 02/16/2011 chest radiograph  Findings: This is a technically satisfactory study.  Pulmonary emboli are identified in the proximal right upper lobe pulmonary artery, and segmental right lower lobe pulmonary arteries. The intraventricular septum does not appear flattened.  Mildly prominent mediastinal and right hilar lymph nodes are nonspecific but may be reactive.  The lungs are clear. There is no evidence of airspace disease, consolidation, nodule or mass.  No acute or suspicious bony abnormalities are identified. The visualized upper abdomen is within normal limits.  IMPRESSION: Right-sided pulmonary emboli.  Mildly prominent mediastinal and right hilar lymph node show - nonspecific but may be reactive.  Critical Value/emergent results were called by telephone at the time of interpretation on 06/17/2011  at 2:05 p.m.  to  [facility], who verbally acknowledged these results.  Original Report  Authenticated By: Rosendo Gros, [name]     12:03 PM Discussed patient and workup with Dr Preston Fleeting.   I-stat chem 8 and urinalysis done at Urgent Care  12:37 PM Discussed patient with Jaynie Crumble, PA-C, who assumes care of patient upon arrival to CDU.  Pt to have doppler to r/o LLE DVT and CT angio chest to r/o PE.    1. Pulmonary emboli   2. Leukocytosis   3. Enlargement of lymph nodes       MDM  Patient with left leg pain, swelling, SOB with exertion, found to have pulmonary emboli.  Also with leukocytosis and prominent lymph nodes on chest CT.  I discussed results with patient.  Pt to be admitted to hospitalist service by Jaynie Crumble, PA-C.          Rise Patience,  PA 06/17/11 1430

## 2011-06-17 NOTE — ED Provider Notes (Signed)
Pt in CDU awaiting Duplex of LLE and CT angio to r/o PE. Pt with non specific symptoms for several days. States 4 days ago, noted left leg swelling on and off, headches, malaise, SOB that is intermittent. Went to UC where they checked istat chem 8 and d dimer, which was elevated and she was sent here to r/o PE. Pt currently has no complaints other than the headache. Will monitor while testing is performed.   1:45 PM Duplex of LLE negative for DVT. It was noted that pt's WBC is 27, Hgb is 9, plt 54. Pt states last had blood drawn about 2 years ago and it was relative.   2:45 PM CT angio positive for PE. I ordered lovenox, but spoke with pharmacy, regarding giving it and her low platelets. They will get back to me for possible substitution of medication. Spoke with [facility], will admit, asked to order hypercoag panel.   2:50 PM Just spoke with pharmacy. OK to give lovenox, will place her on pharmacy protocol and will monitor daily.  Lottie Mussel, PA 06/17/11 1450

## 2011-06-17 NOTE — ED Notes (Signed)
Patient in NAD at this time, resting awaiting MD

## 2011-06-17 NOTE — ED Notes (Signed)
Notified [facility] 

## 2011-06-17 NOTE — ED Notes (Signed)
Patient reports on Tuesday left leg started swelling, headaches, dizziness, "some sob that has caught me off guard".  Reports having pneumonia in January.  Reports headaches and swelling were symptoms she had before when bp was elevated

## 2011-06-17 NOTE — ED Provider Notes (Signed)
Chief Complaint  Patient presents with  . Hypertension    History of Present Illness:   Lynn [name] is a 46 year old female who has had a history of high blood pressure in the past. She presents today with a five-day history of left leg swelling which is worse during the daytime and gets better overnight and shortness of breath with exertion. She denies any leg pain, chest pain, cough, or hemoptysis. She's also had an daily until headache for which she's been taking ibuprofen and Aleve. She has had high blood pressure diagnosed 2 years ago. She was initially on 2 medications including hydrochlorothiazide and what sounds like a beta blocker. On her own she stopped these about a year ago has been monitoring her blood pressure at home, her blood pressure is been a little bit higher over the past week and she's concerned about that. A typical blood pressure reading for her at home is 145/82. She also notes some polyuria, her right upper lip was numb last night and this lasted 15 minutes and went away. She denies any facial asymmetry, muscle weakness, difficulty with speech or swallowing, numbness, tingling, or paresthesias of the extremities, weakness in the extremities, or difficulty walking. She does get some whirling vertigo when she lies down. Her primary care physician is Dr. Clovis Riley. She has had no prior history of DVT, blood clotting disorders, or pulmonary embolus.  Review of Systems:  Other than noted above, the patient denies any of the following symptoms. Systemic:  No fever, chills, sweats, or fatigue. ENT:  No nasal congestion, rhinorrhea, or sore throat. Pulmonary:  No cough, wheezing, shortness of breath, sputum production, hemoptysis. Cardiac:  No palpitations, rapid heartbeat, dizziness, presyncope or syncope. GI:  No abdominal pain, heartburn, nausea, or vomiting. Skin:  No rash or itching. Ext:  No leg pain or swelling.   PMFSH:  Past medical history, family history, social history,  meds, and allergies were reviewed and updated as needed.  Physical Exam:   Vital signs:  BP 153/83  Pulse 94  Resp 22  SpO2 100%  LMP 06/03/2011 Gen:  Alert, oriented, in no distress, skin warm and dry. Eye:  PERRL, lids and conjunctivas normal.  Sclera non-icteric. ENT:  Mucous membranes moist, pharynx clear. Neck:  Supple, no adenopathy or tenderness.  No JVD. Lungs:  Clear to auscultation, no wheezes, rales or rhonchi.  No respiratory distress. Heart:  Regular rhythm.  No gallops, murmers, clicks or rubs. Chest:  No chest wall tenderness. Abdomen:  Soft, nontender, no organomegaly or mass.  Bowel sounds normal.  No pulsatile abdominal mass or bruit. Ext:  She has pitting edema of the left lower leg and foot and lesser edema of the right ankle as well. There is no calf tenderness, but she does get some pain with Homans sign on the left.  Pulses full and equal. Skin:  Warm and dry.  No rash.  Labs:   Results for orders placed during the hospital encounter of 06/17/11  D-DIMER, QUANTITATIVE      Component Value Range   D-Dimer, Quant 2.41 (*) 0.00 - 0.48 (ug/mL-FEU)  POCT I-STAT, CHEM 8      Component Value Range   Sodium 143  135 - 145 (mEq/L)   Potassium 4.2  3.5 - 5.1 (mEq/L)   Chloride 108  96 - 112 (mEq/L)   BUN 13  6 - 23 (mg/dL)   Creatinine, Ser 1.61  0.50 - 1.10 (mg/dL)   Glucose, Bld 096 (*) 70 -  99 (mg/dL)   Calcium, Ion 4.09  8.11 - 1.32 (mmol/L)   TCO2 25  0 - 100 (mmol/L)   Hemoglobin 9.9 (*) 12.0 - 15.0 (g/dL)   HCT 91.4 (*) 78.2 - 46.0 (%)  POCT URINALYSIS DIP (DEVICE)      Component Value Range   Glucose, UA NEGATIVE  NEGATIVE (mg/dL)   Bilirubin Urine NEGATIVE  NEGATIVE    Ketones, ur NEGATIVE  NEGATIVE (mg/dL)   Specific Gravity, Urine 1.025  1.005 - 1.030    Hgb urine dipstick TRACE (*) NEGATIVE    pH 6.0  5.0 - 8.0    Protein, ur 30 (*) NEGATIVE (mg/dL)   Urobilinogen, UA 0.2  0.0 - 1.0 (mg/dL)   Nitrite NEGATIVE  NEGATIVE    Leukocytes, UA NEGATIVE   NEGATIVE     EKG:   Date: 06/17/2011  Rate: 78  Rhythm: normal sinus rhythm  QRS Axis: normal  Intervals: normal  ST/T Wave abnormalities: normal  Conduction Disutrbances:none  Narrative Interpretation: Normal sinus rhythm, normal EKG.  Old EKG Reviewed: none available  Assessment:  The encounter diagnosis was Pulmonary embolus.   Plan:   1.  The following meds were prescribed:   New Prescriptions   No medications on file   2.  The patient was transported to the emergency department via [facility]. An IV was started with normal saline, she was given oxygen and monitored.  Reuben Likes, MD 06/17/11 1051

## 2011-06-17 NOTE — H&P (Signed)
Hospital Admission Note Date: 06/17/2011  Patient name: Lynn [name]           Medical record number: [id number] Date of birth: January 06, 1966           Age: 46 y.o.   Gender: female    PCP:   Benita Stabile, MD, MD   Chief Complaint:  Acute PE  HPI: Lynn [name] is a 46 y.o. female with past medical history of hypertension. Patient came in to the hospital with shortness of breath. Patient still restarted several days ago with left leg swelling on Tuesday and intermittent shortness of breath. The shortness of breath is exertional. Patient also feeling generally unwell. Patient said the left lower extremity pain and swelling also associated with some cramps which relieved by NSAIDs. Patient also reported some headache. She denies any fever, chest pain, cough or hemoptysis. She was presented initially to urgent care and patient had positive D. dimers, so she was sent to the emergency department for further evaluation. Initial evaluation with lower extremity Doppler was negative for DVT. Patient undergone CTA of the chest and showed left-sided acute PE with right heart lymphadenopathy. Patient also did have numerous abnormalities in her blood work including leukocytosis, anemia and thrombocytopenia  Past Medical History: Past Medical History  Diagnosis Date  . Hypertension    Past Surgical History  Procedure Date  . Cesarean section   . Foot surgery   . Foot surgery   . Cesarean section   . Eye surgery     Medications: Prior to Admission medications   Medication Sig Start Date End Date Taking? Authorizing Provider  albuterol (PROVENTIL HFA;VENTOLIN HFA) 108 (90 BASE) MCG/ACT inhaler Inhale 2 puffs into the lungs every 4 (four) hours as needed. For shortness of breath 02/16/11 02/16/12 Yes Luiz Blare, MD  ibuprofen (ADVIL,MOTRIN) 200 MG tablet Take 600 mg by mouth every 6 (six) hours as needed. For pain   Yes Historical Provider, MD  Naproxen Sodium (ALEVE PO) Take 1 tablet  by mouth daily as needed. For pain   Yes Historical Provider, MD    Allergies:  No Known Allergies  Social History:  reports that she has never smoked. She does not have any smokeless tobacco history on file. She reports that she does not drink alcohol or use illicit drugs.  Family History: Family History  Problem Relation Age of Onset  . Cancer Father     Review of Systems:  Constitutional: negative for anorexia, fevers and sweats Eyes: negative for irritation, redness and visual disturbance Ears, nose, mouth, throat, and face: negative for earaches, epistaxis, nasal congestion and sore throat Respiratory: negative for cough, dyspnea on exertion, sputum and wheezing Cardiovascular: negative for chest pain, dyspnea, lower extremity edema, orthopnea, palpitations and syncope Gastrointestinal: negative for abdominal pain, constipation, diarrhea, melena, nausea and vomiting Genitourinary:negative for dysuria, frequency and hematuria Hematologic/lymphatic: negative for bleeding, easy bruising and lymphadenopathy Musculoskeletal:negative for arthralgias, muscle weakness and stiff joints Neurological: negative for coordination problems, gait problems, headaches and weakness Endocrine: negative for diabetic symptoms including polydipsia, polyuria and weight loss Allergic/Immunologic: negative for anaphylaxis, hay fever and urticaria  Physical Exam: BP 156/81  Pulse 86  Temp(Src) 97.9 F (36.6 C) (Oral)  Resp 20  Wt 131.543 kg (290 lb)  SpO2 100%  LMP 06/03/2011 General appearance: alert, cooperative and no distress  Head: Normocephalic, without obvious abnormality, atraumatic  Eyes: conjunctivae/corneas clear. PERRL, EOM's intact. Fundi benign.  Nose: Nares normal. Septum midline. Mucosa normal. No drainage  or sinus tenderness.  Throat: lips, mucosa, and tongue normal; teeth and gums normal  Neck: Supple, no masses, no cervical lymphadenopathy, no JVD appreciated, no meningeal  signs Resp: clear to auscultation bilaterally  Chest wall: no tenderness  Cardio: regular rate and rhythm, S1, S2 normal, no murmur, click, rub or gallop  GI: soft, non-tender; bowel sounds normal; no masses, no organomegaly  Extremities: extremities normal, atraumatic, no cyanosis, she has +1 left pedal edema  Skin: Skin color, texture, turgor normal. No rashes or lesions  Neurologic: Alert and oriented X 3, normal strength and tone. Normal symmetric reflexes. Normal coordination and gait  Labs on Admission:   [facility] 06/17/11 1314 06/17/11 0950  NA 141 143  K 4.0 4.2  CL 108 108  CO2 22 --  GLUCOSE 88 108*  BUN 12 13  CREATININE 0.78 0.90  CALCIUM 8.7 --  MG -- --  PHOS -- --    Basename 06/17/11 1314  AST 37  ALT 42*  ALKPHOS 100  BILITOT 0.3  PROT 6.6  ALBUMIN 3.6   No results found for this basename: LIPASE:2,AMYLASE:2 in the last 72 hours  Basename 06/17/11 1157 06/17/11 0950  WBC 27.7* --  NEUTROABS PENDING --  HGB 9.0* 9.9*  HCT 25.7* 29.0*  MCV 74.5* --  PLT 54* --   No results found for this basename: CKTOTAL:3,CKMB:3,CKMBINDEX:3,TROPONINI:3 in the last 72 hours No results found for this basename: TSH,T4TOTAL,FREET3,T3FREE,THYROIDAB in the last 72 hours No results found for this basename: VITAMINB12:2,FOLATE:2,FERRITIN:2,TIBC:2,IRON:2,RETICCTPCT:2 in the last 72 hours  Radiological Exams on Admission: Ct Angio Chest W/cm &/or Wo Cm  06/17/2011  *RADIOLOGY REPORT*  Clinical Data: Shortness of breath and elevated D-dimer.  CT ANGIOGRAPHY CHEST  Technique:  Multidetector CT imaging of the chest using the standard protocol during bolus administration of intravenous contrast. Multiplanar reconstructed images including MIPs were obtained and reviewed to evaluate the vascular anatomy.  Contrast: OMNIPAQUE IOHEXOL 350 MG/ML SOLN  Comparison: 02/16/2011 chest radiograph  Findings: This is a technically satisfactory study.  Pulmonary emboli are identified in the  proximal right upper lobe pulmonary artery, and segmental right lower lobe pulmonary arteries. The intraventricular septum does not appear flattened.  Mildly prominent mediastinal and right hilar lymph nodes are nonspecific but may be reactive.  The lungs are clear. There is no evidence of airspace disease, consolidation, nodule or mass.  No acute or suspicious bony abnormalities are identified. The visualized upper abdomen is within normal limits.  IMPRESSION: Right-sided pulmonary emboli.  Mildly prominent mediastinal and right hilar lymph node show - nonspecific but may be reactive.  Critical Value/emergent results were called by telephone at the time of interpretation on 06/17/2011  at 2:05 p.m.  to  [facility], who verbally acknowledged these results.  Original Report Authenticated By: Rosendo Gros, [name]     IMPRESSION: Present on Admission:  .Microcytosis .Acute pulmonary embolism .Leukocytosis .Thrombocytopenia .Anemia .Hilar lymphadenopathy  Assessment/Plan  Acute PE Patient denies any history of PE, family history of blood clots, no history of travel, no estrogen supplements, no smoking and no recent surgeries. Hypercoagulability panel drawn, DIC panel drawn. Patient started on Lovenox, probably she can be bridged to Coumadin.  Leukocytosis Of 27,000 with thrombocytopenia, patient is not septic. I will check iron studies to rule out iron deficiency. Initial peripheral smear review showed some blasts which might be reactive or secondary to leukemia. Waiting on the final pathologist report.   Thrombocytopenia  No reported bleeding, can be secondary to bone  marrow problems versus DIC. Followup on the iron studies.   Hilar lymphadenopathy  This is might be reactive versus malignancy.   Anemia Microcytic anemia, I will check iron studies.   , A 06/17/2011, 3:22 PM

## 2011-06-17 NOTE — Plan of Care (Signed)
Problem: Phase I Progression Outcomes Goal: Pain controlled with appropriate interventions Outcome: Not Applicable Date Met:  06/17/11 No pain  Problem: Phase II Progression Outcomes Goal: 02 sats trending upward/stable (PE) Outcome: Completed/Met Date Met:  06/17/11 99 on RA

## 2011-06-17 NOTE — ED Notes (Signed)
Patient states over past few days she has experienced left leg swelling and pain in left leg, patient also stating headache at this time and occasional episode of sob. Patient sent from [facility] with positive D-dimer lab

## 2011-06-17 NOTE — Discharge Instructions (Signed)
We have determined that your problem requires further evaluation in the emergency department.  We will take care of your transport there.  Once at the emergency department, you will be evaluated by a provider and they will order whatever treatment or tests they deem necessary.  We cannot guarantee that they will do any specific test or do any specific treatment.    Pulmonary Embolus A pulmonary (lung) embolus (PE) is a blood clot that has traveled from another place in the body to the lung. Most clots come from deep veins in the legs or pelvis. PE is a dangerous and potentially life-threatening condition that can be treated if identified. CAUSES Blood clots form in a vein for different reasons. Usually several things cause blood clots. They include:  The flow of blood slows down.   The inside of the vein is damaged in some way.   The person has a condition that makes the blood clot more easily. These conditions may include:   Older age (especially over 20 years old).   Having a history of blood clots.   Having major or lengthy surgery. Hip surgery is particularly high-risk.   Breaking a hip or leg.   Sitting or lying still for a long time.   Cancer or cancer treatment.   Having a long, thin tube (catheter) placed inside a vein during a medical procedure.   Being overweight (obese).   Pregnancy and childbirth.   Medicines with estrogen.   Smoking.   Other circulation or heart problems.  SYMPTOMS  The symptoms of a PE usually start suddenly and include:  Shortness of breath.   Coughing.   Coughing up blood or blood-tinged mucus (phlegm).   Chest pain. Pain is often worse with deep breaths.   Rapid heartbeat.  DIAGNOSIS  If a PE is suspected, your caregiver will take a medical history and carry out a physical exam. Your caregiver will check for the risk factors listed above. Tests that also may be required include:  Blood tests, including studies of the clotting  properties of your blood.   Imaging tests. Ultrasound, CT, MRI, and other tests can all be used to see if you have clots in your legs or lungs. If you have a clot in your legs and have breathing or chest problems, your caregiver may conclude that you have a clot in your lungs. Further lung tests may not be needed.   An EKG can look for heart strain from blood clots in the lungs.  PREVENTION   Exercise the legs regularly. Take a brisk 30 minute walk every day.   Maintain a weight that is appropriate for your height.   Avoid sitting or lying in bed for long periods of time without moving your legs.   Women, particularly those over the age of 84, should consider the risks and benefits of taking estrogen medicines, including birth control pills.   Do not smoke, especially if you take estrogen medicines.   Long-distance travel can increase your risk. You should exercise your legs by walking or pumping the muscles every hour.   In hospital prevention:   Your caregiver will assess your need for preventive PE care (prophylaxis) when you are admitted to the hospital. If you are having surgery, your surgeon will assess you the day of or day after surgery.   Prevention may include medical and nonmedical measures.  TREATMENT   The most common treatment for a PE is blood thinning (anticoagulant) medicine, which reduces the blood's  tendency to clot. Anticoagulants can stop new blood clots from forming and old ones from growing. They cannot dissolve existing clots. Your body does this by itself over time. Anticoagulants can be given by mouth, by intravenous (IV) access, or by injection. Your caregiver will determine the best program for you.   Less commonly, clot-dissolving drugs (thrombolytics) are used to dissolve a PE. They carry a high risk of bleeding, so they are used mainly in severe cases.   Very rarely, a blood clot in the leg needs to be removed surgically.   If you are unable to take  anticoagulants, your caregiver may arrange for you to have a filter placed in a main vein in your belly (abdomen). This filter prevents clots from traveling to your lungs.  HOME CARE INSTRUCTIONS   Take all medicines prescribed by your caregiver. Follow the directions carefully.   You will most likely continue taking anticoagulants after you leave the hospital. Your caregiver will advise you on the length of treatment (usually 3 to 6 months, sometimes for life).   Taking too much or too little of an anticoagulant is dangerous. While taking this type of medicine, you will need to have regular blood tests to be sure the dose is correct. The dose can change for many reasons. It is critically important that you take this medicine exactly as prescribed and that you have blood tests exactly as directed.   Many foods can interfere with anticoagulants. These include foods high in vitamin K, such as spinach, kale, broccoli, cabbage, collard and turnip greens, Brussels sprouts, peas, cauliflower, seaweed, parsley, beef and pork liver, green tea, and soybean oil. Your caregiver should discuss limits on these foods with you or you should arrange a visit with a dietician to answer your questions.   Many medicines can interfere with anticoagulants. You must tell your caregiver about any and all medicines you take. This includesall vitamins and supplements. Be especially cautious with aspirin and anti-inflammatory medicines. Ask your caregiver before taking these.   Anticoagulants can have side effects, mostly excessive bruising or bleeding. You will need to hold pressure over cuts for longer than usual. Avoid alcoholic drinks or consume only very small amounts while taking this medicine.   If you are taking an anticoagulant:   Wear a medical alert bracelet.   Notify your dentist or other caregivers before procedures.   Avoid contact sports.   Ask your caregiver how soon you can go back to normal activities.  Not being active can lead to new clots. Ask for a list of what you should and should not do.   Exercise your lower leg muscles. This is important while traveling.   You may need to wear compression stockings. These are tight elastic stockings that apply pressure to the lower legs. This can help keep the blood in the legs from clotting.   If you are a smoker, you should quit.   Learn as much as you can about pulmonary embolisms.  SEEK MEDICAL CARE IF:   You notice a rapid heartbeat.   You feel weaker or more tired than usual.   You feel faint.   You notice increased bruising.   Your symptoms are not getting better in the time expected.   You are having side effects of medicine.   You have an oral temperature above 102 F (38.9 C).   You discover other family members with blood clots. This may require further testing for inherited diseases or conditions.  SEEK  IMMEDIATE MEDICAL CARE IF:   You have chest pain.   You have trouble breathing.   You have new or increased swelling or pain in one leg.   You cough up blood.   You notice blood in vomit, in a bowel movement, or in urine.   You have an oral temperature above 102 F (38.9 C), not controlled by medicine.  You may have another PE. A blood clot in the lungs is a medical emergency. Call your local emergency services (911 in [place] to get to the nearest hospital or clinic. Do not drive yourself. MAKE SURE YOU:   Understand these instructions.   Will watch your condition.   Will get help right away if you are not doing well or get worse.  Document Released: 01/14/2000 Document Revised: 01/05/2011 Document Reviewed: 07/20/2008 [facility] Patient Information 2012 [place], [place].

## 2011-06-17 NOTE — ED Provider Notes (Signed)
Medical screening examination/treatment/procedure(s) were conducted as a shared visit with non-physician practitioner(s) and myself.  I personally evaluated the patient during the encounter   Dione Booze, MD 06/17/11 1701

## 2011-06-17 NOTE — Progress Notes (Signed)
VASCULAR LAB PRELIMINARY  PRELIMINARY  PRELIMINARY  PRELIMINARY  Left lower extremity venous Dopplers completed.    Preliminary report:  There is no DVT or SVT noted in the left lower extremity.  Lynn [name], 06/17/2011, 12:48 PM

## 2011-06-17 NOTE — Progress Notes (Addendum)
ANTICOAGULATION CONSULT NOTE - Initial Consult  Pharmacy Consult for Lovenox Indication: ?DVT  No Known Allergies  Patient Measurements: Weight: 290 lb (131.543 kg)  Vital Signs: Temp: 97.9 F (36.6 C) (05/18 1124) Temp src: Oral (05/18 1124) BP: 144/83 mmHg (05/18 1245) Pulse Rate: 75  (05/18 1245)  Labs:  Basename 06/17/11 1314 06/17/11 1157 06/17/11 0950  HGB -- 9.0* 9.9*  HCT -- 25.7* 29.0*  PLT -- 54* --  APTT 34 -- --  LABPROT 13.0 -- --  INR 0.96 -- --  HEPARINUNFRC -- -- --  CREATININE 0.78 -- 0.90  CKTOTAL -- -- --  CKMB -- -- --  TROPONINI -- -- --    CrCl is unknown because there is no height on file for the current visit.   Medical History: Past Medical History  Diagnosis Date  . Hypertension     Assessment: 45 yof to be started on full dose lovenox for PE seen on CT angio. Patient is obese and estimated renal function is >129ml/min. Noted that Left LE dopplers are negative for DVT or SVT. There is no bleeding noted however platelets are at 54. Pt also is anemic with a low hematocrit as well. Patient has limited history in our system and there is no past documentation of low platelets or history of HIT.   Goal of Therapy:   Monitor platelets by anticoagulation protocol: Yes   Plan:  1. Lovenox 130mg  sq q12h 2. Pharmacy to follow up daily with platelets, order CBC daily  Juliann Pulse 06/17/2011,2:27 PM

## 2011-06-17 NOTE — ED Notes (Signed)
Report given to Magda Paganini, RN in CDU, patient in NAD at time of transfer, patient ambulated with no assistance from stretcher to CDU bed.

## 2011-06-18 DIAGNOSIS — D72829 Elevated white blood cell count, unspecified: Secondary | ICD-10-CM

## 2011-06-18 DIAGNOSIS — D696 Thrombocytopenia, unspecified: Secondary | ICD-10-CM

## 2011-06-18 DIAGNOSIS — I2699 Other pulmonary embolism without acute cor pulmonale: Secondary | ICD-10-CM

## 2011-06-18 LAB — COMPREHENSIVE METABOLIC PANEL
ALT: 40 U/L — ABNORMAL HIGH (ref 0–35)
AST: 33 U/L (ref 0–37)
Alkaline Phosphatase: 96 U/L (ref 39–117)
Calcium: 8.8 mg/dL (ref 8.4–10.5)
GFR calc Af Amer: >90 mL/min (ref >90)
Glucose, Bld: 95 mg/dL (ref 70–99)
Potassium: 3.9 mEq/L (ref 3.5–5.1)
Sodium: 139 mEq/L (ref 135–145)
Total Protein: 6.3 g/dL (ref 6.0–8.3)

## 2011-06-18 LAB — CBC
HCT: 23.5 % — ABNORMAL LOW (ref 36.0–46.0)
Hemoglobin: 8.3 g/dL — ABNORMAL LOW (ref 12.0–15.0)
MCHC: 35.3 g/dL (ref 30.0–36.0)
MCV: 74.4 fL — ABNORMAL LOW (ref 78.0–100.0)
RDW: 18.4 % — ABNORMAL HIGH (ref 11.5–15.5)
WBC: 23.6 10*3/uL — ABNORMAL HIGH (ref 4.0–10.5)

## 2011-06-18 LAB — IRON AND TIBC
Iron: 252 ug/dL — ABNORMAL HIGH (ref 42–135)
Saturation Ratios: 92 % — ABNORMAL HIGH (ref 20–55)
UIBC: 23 ug/dL — ABNORMAL LOW (ref 125–400)

## 2011-06-18 LAB — GLUCOSE, CAPILLARY: Glucose-Capillary: 93 mg/dL (ref 70–99)

## 2011-06-18 LAB — TSH: TSH: 0.662 u[IU]/mL (ref 0.350–4.500)

## 2011-06-18 MED ORDER — ENOXAPARIN SODIUM 150 MG/ML ~~LOC~~ SOLN
130.0000 mg | Freq: Two times a day (BID) | SUBCUTANEOUS | Status: DC
  Administered 2011-06-19 – 2011-06-21 (×4): 130 mg via SUBCUTANEOUS
  Filled 2011-06-18 (×6): qty 1

## 2011-06-18 MED ORDER — ENOXAPARIN SODIUM 150 MG/ML ~~LOC~~ SOLN
130.0000 mg | Freq: Once | SUBCUTANEOUS | Status: AC
  Administered 2011-06-18: 130 mg via SUBCUTANEOUS
  Filled 2011-06-18: qty 1

## 2011-06-18 MED ORDER — OXYCODONE-ACETAMINOPHEN 5-325 MG PO TABS: 1.0000 | ORAL_TABLET | Freq: Four times a day (QID) | ORAL | Status: DC | PRN

## 2011-06-18 NOTE — Progress Notes (Signed)
DAILY PROGRESS NOTE                              [facility]AL [facility]  SUBJECTIVE: Mild shortness of breath, has right frontal headache.  OBJECTIVE: BP 125/72  Pulse 72  Temp(Src) 99.3 F (37.4 C) (Oral)  Resp 20  Ht 5\' 8"  (1.727 m)  Wt 131.543 kg (290 lb)  BMI 44.09 kg/m2  SpO2 96%  LMP 06/03/2011  Intake/Output Summary (Last 24 hours) at 06/18/11 1111 Last data filed at 06/18/11 0900  Gross per 24 hour  Intake    420 ml  Output      0 ml  Net    420 ml                      Weight change:  Physical Exam: General: Alert and awake oriented x3 not in any acute distress. HEENT: anicteric sclera, pupils equal reactive to light and accommodation CVS: S1-S2 heard, no murmur rubs or gallops Chest: clear to auscultation bilaterally, no wheezing rales or rhonchi Abdomen:  normal bowel sounds, soft, nontender, nondistended, no organomegaly Neuro: Cranial nerves II-XII intact, no focal neurological deficits Extremities: no cyanosis, no clubbing or edema noted bilaterally   Lab Results:  Basename 06/18/11 0505 06/17/11 1314  NA 139 141  K 3.9 4.0  CL 104 108  CO2 23 22  GLUCOSE 95 88  BUN 11 12  CREATININE 0.78 0.78  CALCIUM 8.8 8.7  MG -- --  PHOS -- --    Basename 06/18/11 0505 06/17/11 1314  AST 33 37  ALT 40* 42*  ALKPHOS 96 100  BILITOT 0.3 0.3  PROT 6.3 6.6  ALBUMIN 3.4* 3.6   No results found for this basename: LIPASE:2,AMYLASE:2 in the last 72 hours  Basename 06/18/11 0505 06/17/11 1512 06/17/11 1157  WBC 23.6* -- 27.7*  NEUTROABS -- -- --  HGB 8.3* -- 9.0*  HCT 23.5* -- 25.7*  MCV 74.4* -- 74.5*  PLT 39* 53* --   No results found for this basename: CKTOTAL:3,CKMB:3,CKMBINDEX:3,TROPONINI:3 in the last 72 hours No components found with this basename: POCBNP:3  Basename 06/17/11 1512 06/17/11 0939  DDIMER 2.37* 2.41*    Basename 06/17/11 1731  HGBA1C 7.4*   No results found for this basename:  CHOL:2,HDL:2,LDLCALC:2,TRIG:2,CHOLHDL:2,LDLDIRECT:2 in the last 72 hours No results found for this basename: TSH,T4TOTAL,FREET3,T3FREE,THYROIDAB in the last 72 hours  Basename 06/17/11 1731  VITAMINB12 --  FOLATE --  FERRITIN --  TIBC 275  IRON 252*  RETICCTPCT <0.4*    Micro Results: No results found for this or any previous visit (from the past 240 hour(s)).  Studies/Results: Ct Angio Chest W/cm &/or Wo Cm  06/17/2011  *RADIOLOGY REPORT*  Clinical Data: Shortness of breath and elevated D-dimer.  CT ANGIOGRAPHY CHEST  Technique:  Multidetector CT imaging of the chest using the standard protocol during bolus administration of intravenous contrast. Multiplanar reconstructed images including MIPs were obtained and reviewed to evaluate the vascular anatomy.  Contrast: OMNIPAQUE IOHEXOL 350 MG/ML SOLN  Comparison: 02/16/2011 chest radiograph  Findings: This is a technically satisfactory study.  Pulmonary emboli are identified in the proximal right upper lobe pulmonary artery, and segmental right lower lobe pulmonary arteries. The intraventricular septum does not appear flattened.  Mildly prominent mediastinal and right hilar lymph nodes are nonspecific but may be reactive.  The lungs are clear. There is no evidence of airspace disease, consolidation,  nodule or mass.  No acute or suspicious bony abnormalities are identified. The visualized upper abdomen is within normal limits.  IMPRESSION: Right-sided pulmonary emboli.  Mildly prominent mediastinal and right hilar lymph node show - nonspecific but may be reactive.  Critical Value/emergent results were called by telephone at the time of interpretation on 06/17/2011  at 2:05 p.m.  to  [facility], who verbally acknowledged these results.  Original Report Authenticated By: Rosendo Gros, [name]   Medications: Scheduled Meds:   . acetaminophen  650 mg Oral Once  . enoxaparin (LOVENOX) injection  130 mg Subcutaneous Q12H  . polyethylene glycol  17  g Oral Daily  . sodium chloride  3 mL Intravenous Q12H  . DISCONTD: enoxaparin (LOVENOX) injection  1 mg/kg Subcutaneous Q12H   Continuous Infusions:  PRN Meds:.acetaminophen, acetaminophen, albuterol, albuterol, HYDROcodone-acetaminophen, iohexol, morphine injection, ondansetron (ZOFRAN) IV, ondansetron  ASSESSMENT & PLAN: Principal Problem:  *Acute pulmonary embolism Active Problems:  Microcytosis  Leukocytosis  Thrombocytopenia  Anemia  Hilar lymphadenopathy   Acute PE -Patient started on Lovenox, likely she will be started on Coumadin. -Hematology consulted. -Hypercoagulability panel is pending (this was drawn before the Lovenox was given).  Leukocytosis -Mainly lymphocytosis, smear from yesterday checked by the pathologist and labeled as reactive lymphocytosis. -Another peripheral smear today to be reviewed by hematology.  Thrombocytopenia -I called Dr. Arline Asp, he will evaluate her later today. -DIC panel does not shows normal fibrinogen, the high D. dimers is likely from the acute PE  Anemia -Microcytic anemia, iron studies showed iron of more than 250. -With anemia, thrombocytopenia and leukocytosis raises questions about bone marrow integrity, hematology consulted.  Diabetes mellitus type 2 -Hemoglobin A1c 7.4, diagnostic for diabetes according to ADA guidelines. -I will check CBGs, if it's high might start SSI.   LOS: 1 day   , A 06/18/2011, 11:11 AM

## 2011-06-18 NOTE — Progress Notes (Signed)
This hospital consultation note has been dictated [phone].  Patient's case has been discussed with Dr. Arthor Captain. Inspection of the peripheral smear is highly suggestive of acute leukemia. We are trying to arrange for a bone marrow aspirate and biopsy to be carried out by interventional radiology tomorrow. We have also requested flow studies, cytogenetics and FISH studies for acute leukemia. If the bone marrow confirms the diagnosis of acute leukemia, she should be transferred to a tertiary center, most likely [facility]. I have discussed our concerns with the patient and plans for moving forward.  The origin of the patient's pulmonary blood clots is unclear given the negative venous Doppler study of the legs. The patient is currently receiving Lovenox every 12 hours, however the patient's platelet count today is 39,000 down from 54,000 yesterday. In view of this, I would be inclined to give the patient platelet transfusions if her platelet count were to drop below 30,000. Clearly she is at risk for bleeding given the circumstances. Consideration to an inferior vena cava filter would have to be given but may not be effective in the absence of blood clots involving the legs.  Currently the patient is not on antibiotics nor are there any signs of infection at the present time. The patient is currently neutropenic and will need to be started on broad-spectrum antibiotics if she spikes a fever or appears to be developing an infection.   Samul Dada 06/18/2011 6:17 PM

## 2011-06-18 NOTE — Consult Note (Signed)
NAMEYARNELL, KOZLOSKI NO.:  [id number]  MEDICAL RECORD NO.:  [id number]  LOCATION:  5506                         FACILITY:  [facility]  PHYSICIAN:  Samul Dada, M.D.DATE OF BIRTH:  11-Dec-1965  DATE OF CONSULTATION:  06/18/2011 DATE OF DISCHARGE:                                CONSULTATION   HISTORY:  Lynn [name] is a 46 year old African American female whom I am asked to see in consultation by Dr. Arthor Captain for evaluation of thrombocytopenia, anemia, and leukocytosis.  In addition, the patient was admitted with symptomatic pulmonary emboli.  This patient has been in generally excellent health except for a past history of hypertension, currently under treatment.  This patient had a flu-like illness early this year.  Apparently, it turned into pneumonia and she was treated as an outpatient.  Since that time, she has not felt well.  For the past several weeks, the patient has had increasing shortness of breath.  She may have had some dyspnea on exertion previously, but this was relatively mild.  Symptoms have progressively gotten worse such that even walking around her home, taking a few steps, and any type of minimal exertion would result in fairly significant dyspnea.  The patient also over the past several days has noted recurrent swelling of her left leg with some pain and tenderness in the calf.  She also was having fairly severe headaches, which were interfering with sleep.  She has had a couple of episodes that have been quite transient, lasting only a few minutes where her right upper and lower lip were numb without any other associated neurologic symptoms. The patient had difficulty sleeping.  She took ibuprofen and Aleve for her headache.  She denied any chest pain or hemoptysis.  No cough.  She actually had had some dizzy spells suggestive of vertigo when laying down on her right side.  As stated, the dyspnea on exertion has gotten much worse over the  past few days.  The patient went to Urgent Care on Saturday, Jun 17, 2011, because of the symptoms.  She was transferred to the emergency room, where a workup disclosed pulmonary emboli. Interestingly, she underwent Doppler studies of her legs, which showed no evidence for DVT or superficial thrombosis.  There was no evidence for a Baker cyst on the left.  A CT angiogram of the chest carried out on Jun 17, 2011 disclosed pulmonary emboli in the proximal right upper lobe, pulmonary artery and segmental right lower lobe pulmonary arteries.  There was also noted to be some mildly prominent mediastinal and right hilar lymph nodes.  Also of note was an abnormal CBC. Specifically, the white count was 27.7 with 4% neutrophils, 63% lymphs, hemoglobin of 9.0, hematocrit 25.7, and platelets 54,000.  The patient was admitted to the hospital and started on Lovenox and is currently receiving 130 mg subcu every 12 hours.  PAST MEDICAL HISTORY:  Notable for hypertension, diagnosed a couple of years ago.  The patient was treated with hydrochlorothiazide and another medicine, but has not taken these medicines for about a year and half. She has had a history of palpitations and undergone a workup.  She occasionally has palpitations.  These are improved.  She has had arthritis involving her right hip and this has caused her some pain.  She has undergone the following surgery.  Eye surgery as a child for lazy eye, surgery on her left foot for bunions in 1995, childbirth approximately 17 years ago, additional surgery on her left foot, C- section in 2000 resulting in delivery of her son and additional corrective eye surgery.  The patient has been a blood donor on a couple of occasions, most recently about a year ago when she was told her hemoglobin was 10.  She has not received blood transfusions.  She denies any history of trauma.  Prior to admission to the hospital, she was on no prescription medicines  other than an inhaler, apparently Proventil, which she obtained at Urgent Care for her trouble breathing.  ALLERGIES:  She denies any allergies to any medicines.  She denies any history of exposure to AIDS.  FAMILY HISTORY:  Positive for hypertension.  Mother died of colon cancer at age 92.  Father died at age 38, apparently had problems with alcohol. He had cirrhosis and diabetes mellitus.  The patient has 7 sisters with many of them having hypertension.  One of her sisters is a Careers adviser at the [employer].  The patient has a daughter age 87 who has migraine headaches, may have Marfan syndrome.  A son age 36 has asthma, allergies, and eczema.  There is no history of blood clots or thrombotic tendencies in the family nor is there any history of blood disorders, leukemia, or any other cancer other than colon cancer.  SOCIAL HISTORY:  The patient is originally from [place], lived briefly in [place], [place], and has been living here in [place] since 1992.  She has been divorced for 3 years.  Children live with her in a condominium.  The patient has worked at the [employer] for the past 12 years.  She drives.  She likes to do yard work.  REVIEW OF SYSTEMS:  The patient states that she has not felt entirely well since she had the flu early this year; however, she has certainly been functional and able to work.  She has had headaches in the past, attributed to hypertension.  Over the past several days, especially since Tuesday, Jun 13, 2011, she has been having more severe headaches located frontally with some tenderness.  They have interfered with sleep.  She wears glasses.  No double vision.  Vision and hearing are good.  No sinus problem.  She does have some hay fever.  Her weight generally runs about 300 pounds.  On admission, her weight was 290 pounds, height 5 feet 8 inches, body surface area 2.51 m2.  She denies any problems with eating, dyspeptic  symptoms, nausea, vomiting, diarrhea, constipation, or liver problems.  She has been worked up the palpitations, had an echo one time.  Pulmonary exam as stated above. She has been using an inhaler lately.  She has had some nocturia recently in association with swelling of her left lower leg.  She denies any urinary tract infections or kidney stones.  No breast problems. Last mammogram was 2 years ago.  She has regular periods.  She has never had the blood clots before.  The is the intermittent swelling of her left leg, which has gotten much worse over the last several days.  No bleeding or bruising problems.  She has intermittent right hip pain. She denies any fever or chills.  She does  have night sweats, but that is not new.  No skin rashes.  No history of depression or emotional problems.  PHYSICAL EXAMINATION:  GENERAL:  The patient is very pleasant, in no acute distress. VITAL SIGNS:  She is currently afebrile, although maximum temperature recorded at 99.3, pulse 75 and regular, respirations regular and nonlabored.  O2 saturation is recorded at 100% and being on room air although when I examined the patient, she was using oxygen at 2 L/min. She was in no respiratory distress.  Weight is 290 pounds, height 5 feet 8 inches, body surface area 2.51 m2. HEENT:  She wears glasses.  Pupillary and extraocular movements were normal.  Mouth and pharynx benign.  Dentition is well preserved. NECK:  No peripheral adenopathy palpable in the neck, supraclavicular, axillary, or inguinal areas.  There was a 2-4 mm slightly tender nodule behind the left ear. LUNGS:  Clear to percussion and auscultation. CARDIAC:  Regular rhythm without murmur or rub. BREASTS:  Not examined. ABDOMEN:  Massively obese, nontender with no organomegaly or masses palpable. EXTREMITIES:  Left lower leg had 1 to 2+ pitting edema, but no calf tenderness.  Negative Homans sign.  Both calves measured about 46  cm. NEUROLOGIC:  Grossly normal.  LABORATORY DATA:  CBC from yesterday, Jun 17, 2011, white count 27.7 with 4% neutrophils, 63% lymphs, hemoglobin of 9.0, hematocrit 25.7, platelets of 54,000.  MCV and MCH were low.  Repeat CBC today, Jun 18, 2011, white count 23.6, hemoglobin 8.3, hematocrit 23.5, and platelets of 39,000.  Differential was not carried out; however, inspection of the peripheral smear from today and yesterday shows approximately 30% of cells looking mature with a very high [place] ratio.  Some of these could be microblasts.  Their size approach is that of a lymphocyte, but there are atypical nuclear features and many smudge cells and a paucity of neutrophils.  There were occasional schistocytes.  Platelets were clearly decreased.  The smear is very worrisome for acute leukemia.  I did not see any Auer rods.  Chemistries from today notable for an albumin of 3.4, an ALT of 40, otherwise normal.  BUN was 11, creatinine 0.78.  We will be checking an LDH and uric acid in the morning.  Iron was 252, TIBC 275, iron saturation 92%, ferritin 204.  Folate 11.8.  D- dimer was 2.37.  Fibrinogen 309.  Pro-time 13.4.  INR 1.00 and PTT 28. I do not think the patient has DIC.  A hypercoagulable panel was drawn and is pending.  Vitamin B12 level was 337.  TSH 0.662.  Antithrombin 3 was 96.  Homocystine was 12.6.  IMAGING STUDIES:  Venous Doppler of the legs and CT angiogram of the chest are presented above.  A chest x-ray, 2 view, from February 16, 2011 showed no acute infiltrate or pulmonary edema.  There were minimal perihilar increased bronchial markings without focal consolidation.  IMPRESSION AND PLAN: 1. Leukocytosis with neutropenia, anemia, and thrombocytopenia.     Peripheral smear suggests the possibility of acute leukemia.  The     patient will be undergoing a bone marrow aspirate and biopsy by     Interventional Radiology.  Flow studies, cytogenetics, and fish     studies for  acute leukemia panel will be obtained.  If the bone     marrow confirms the diagnosis of acute leukemia, the patient will     be transferred to a tertiary center, most likely [facility].  The patient has been informed     of the findings and our concerns and the reason for doing the bone     marrow. 2. Pulmonary emboli with negative Doppler, although the patient has     clearly been having increased swelling of her left lower leg.     Hypercoagulable panel was pending.  The patient is on Lovenox,     currently 130 mg every 12 hours.  Her thrombocytopenia, especially     if her platelet count continues to decrease, we will prevent some     problems regarding continued anticoagulation for this patient as     clearly the risk of bleeding will be increased as her platelet     count falls. 3. Headaches. 4. History of left lower leg swelling. 5. Obesity. 6. Hypertension. 7. Full code.     Samul Dada, [name]     DSM/MEDQ  D:  06/18/2011  T:  06/18/2011  Job:  [id number]  cc:   Clydia Llano, MD Dr. Lupe Carney

## 2011-06-19 ENCOUNTER — Encounter (HOSPITAL_COMMUNITY): Payer: Self-pay | Admitting: Radiology

## 2011-06-19 ENCOUNTER — Inpatient Hospital Stay (HOSPITAL_COMMUNITY): Payer: 59

## 2011-06-19 DIAGNOSIS — R112 Nausea with vomiting, unspecified: Secondary | ICD-10-CM

## 2011-06-19 DIAGNOSIS — M311 Thrombotic microangiopathy: Secondary | ICD-10-CM

## 2011-06-19 DIAGNOSIS — D7289 Other specified disorders of white blood cells: Secondary | ICD-10-CM

## 2011-06-19 DIAGNOSIS — I2699 Other pulmonary embolism without acute cor pulmonale: Secondary | ICD-10-CM

## 2011-06-19 LAB — CBC
HCT: 27.1 % — ABNORMAL LOW (ref 36.0–46.0)
Hemoglobin: 9.4 g/dL — ABNORMAL LOW (ref 12.0–15.0)
MCH: 25.8 pg — ABNORMAL LOW (ref 26.0–34.0)
MCHC: 34.7 g/dL (ref 30.0–36.0)
MCV: 74.5 fL — ABNORMAL LOW (ref 78.0–100.0)
RBC: 3.64 MIL/uL — ABNORMAL LOW (ref 3.87–5.11)

## 2011-06-19 LAB — BASIC METABOLIC PANEL
BUN: 12 mg/dL (ref 6–23)
CO2: 26 mEq/L (ref 19–32)
Chloride: 101 mEq/L (ref 96–112)
Glucose, Bld: 98 mg/dL (ref 70–99)
Potassium: 3.9 mEq/L (ref 3.5–5.1)
Sodium: 138 mEq/L (ref 135–145)

## 2011-06-19 LAB — DIFFERENTIAL
Basophils Relative: 0 % (ref 0–1)
Eosinophils Relative: 0 % (ref 0–5)
Monocytes Relative: 1 % — ABNORMAL LOW (ref 3–12)
Neutrophils Relative %: 6 % — ABNORMAL LOW (ref 43–77)
Promyelocytes Absolute: 0 %
nRBC: 0 /100 WBC

## 2011-06-19 LAB — URIC ACID: Uric Acid, Serum: 6.6 mg/dL (ref 2.4–7.0)

## 2011-06-19 LAB — BONE MARROW EXAM: Bone Marrow Exam: 328

## 2011-06-19 LAB — GLUCOSE, CAPILLARY: Glucose-Capillary: 116 mg/dL — ABNORMAL HIGH (ref 70–99)

## 2011-06-19 MED ORDER — FENTANYL CITRATE 0.05 MG/ML IJ SOLN
INTRAMUSCULAR | Status: AC
  Filled 2011-06-19: qty 4

## 2011-06-19 MED ORDER — FENTANYL CITRATE 0.05 MG/ML IJ SOLN
INTRAMUSCULAR | Status: AC | PRN
  Administered 2011-06-19 (×3): 25 ug via INTRAVENOUS
  Administered 2011-06-19: 50 ug via INTRAVENOUS
  Administered 2011-06-19 (×2): 25 ug via INTRAVENOUS

## 2011-06-19 MED ORDER — MIDAZOLAM HCL 5 MG/5ML IJ SOLN
INTRAMUSCULAR | Status: AC | PRN
  Administered 2011-06-19 (×3): 1 mg via INTRAVENOUS

## 2011-06-19 MED ORDER — MIDAZOLAM HCL 2 MG/2ML IJ SOLN
INTRAMUSCULAR | Status: AC
  Filled 2011-06-19: qty 4

## 2011-06-19 NOTE — ED Provider Notes (Signed)
Medical screening examination/treatment/procedure(s) were performed by non-physician practitioner and as supervising physician I was immediately available for consultation/collaboration.   Dione Booze, MD 06/19/11 [phone]

## 2011-06-19 NOTE — H&P (View-Only) (Signed)
This hospital consultation note has been dictated #[id number].  Patient's case has been discussed with Dr. Elmahi. Inspection of the peripheral smear is highly suggestive of acute leukemia. We are trying to arrange for a bone marrow aspirate and biopsy to be carried out by interventional radiology tomorrow. We have also requested flow studies, cytogenetics and FISH studies for acute leukemia. If the bone marrow confirms the diagnosis of acute leukemia, she should be transferred to a tertiary center, most likely [facility]. I have discussed our concerns with the patient and plans for moving forward.  The origin of the patient's pulmonary blood clots is unclear given the negative venous Doppler study of the legs. The patient is currently receiving Lovenox every 12 hours, however the patient's platelet count today is 39,000 down from 54,000 yesterday. In view of this, I would be inclined to give the patient platelet transfusions if her platelet count were to drop below 30,000. Clearly she is at risk for bleeding given the circumstances. Consideration to an inferior vena cava filter would have to be given but may not be effective in the absence of blood clots involving the legs.  Currently the patient is not on antibiotics nor are there any signs of infection at the present time. The patient is currently neutropenic and will need to be started on broad-spectrum antibiotics if she spikes a fever or appears to be developing an infection.   , S 06/18/2011 6:17 PM  

## 2011-06-19 NOTE — Procedures (Signed)
CT bone marrow core No complication No blood loss. See complete dictation in [facility].

## 2011-06-19 NOTE — Interval H&P Note (Cosign Needed)
History and Physical Interval Note:  06/19/2011 9:33 AM  Lynn [name]  Is scheduled for CT guided bone marrow biopsy today to r/o leukemia secondary to findings of anemia, thrombocytopenia, leukocytosis.  The various methods of treatment have been discussed with the patient and family. After consideration of risks, benefits and other options for treatment, the patient has consented to the above procedure.  The patients' history has been reviewed, patient examined, no change in status, stable for the above procedure.  I have reviewed the patients' chart and labs.  Questions were answered to the patient's satisfaction.   Past Medical History  Diagnosis Date  . Hypertension    Past Surgical History  Procedure Date  . Cesarean section   . Foot surgery   . Foot surgery   . Cesarean section   . Eye surgery    Ct Angio Chest W/cm &/or Wo Cm  06/17/2011  *RADIOLOGY REPORT*  Clinical Data: Shortness of breath and elevated D-dimer.  CT ANGIOGRAPHY CHEST  Technique:  Multidetector CT imaging of the chest using the standard protocol during bolus administration of intravenous contrast. Multiplanar reconstructed images including MIPs were obtained and reviewed to evaluate the vascular anatomy.  Contrast: OMNIPAQUE IOHEXOL 350 MG/ML SOLN  Comparison: 02/16/2011 chest radiograph  Findings: This is a technically satisfactory study.  Pulmonary emboli are identified in the proximal right upper lobe pulmonary artery, and segmental right lower lobe pulmonary arteries. The intraventricular septum does not appear flattened.  Mildly prominent mediastinal and right hilar lymph nodes are nonspecific but may be reactive.  The lungs are clear. There is no evidence of airspace disease, consolidation, nodule or mass.  No acute or suspicious bony abnormalities are identified. The visualized upper abdomen is within normal limits.  IMPRESSION: Right-sided pulmonary emboli.  Mildly prominent mediastinal and right hilar  lymph node show - nonspecific but may be reactive.  Critical Value/emergent results were called by telephone at the time of interpretation on 06/17/2011  at 2:05 p.m.  to  [facility], who verbally acknowledged these results.  Original Report Authenticated By: Rosendo Gros, [name]  Results for orders placed during the hospital encounter of 06/17/11  CBC      Component Value Range   WBC 27.7 (*) 4.0 - 10.5 (K/uL)   RBC 3.45 (*) 3.87 - 5.11 (MIL/uL)   Hemoglobin 9.0 (*) 12.0 - 15.0 (g/dL)   HCT 16.1 (*) 09.6 - 46.0 (%)   MCV 74.5 (*) 78.0 - 100.0 (fL)   MCH 26.1  26.0 - 34.0 (pg)   MCHC 35.0  30.0 - 36.0 (g/dL)   RDW 04.5 (*) 40.9 - 15.5 (%)   Platelets 54 (*) 150 - 400 (K/uL)  DIFFERENTIAL      Component Value Range   Neutrophils Relative 4 (*) 43 - 77 (%)   Band Neutrophils 2  0 - 10 (%)   Lymphocytes Relative 63 (*) 12 - 46 (%)   Monocytes Relative 1 (*) 3 - 12 (%)   Eosinophils Relative 0  0 - 5 (%)   Basophils Relative 0  0 - 1 (%)   WBC Morphology ATYPICAL MONONUCLEAR CELLS     RBC Morphology ELLIPTOCYTES     Smear Review PLATELETS APPEAR DECREASED     nRBC 0  0 (/100 WBC)   Metamyelocytes Relative 0     Myelocytes 0     Promyelocytes Absolute 0     Blasts 0    COMPREHENSIVE METABOLIC PANEL  Component Value Range   Sodium 141  135 - 145 (mEq/L)   Potassium 4.0  3.5 - 5.1 (mEq/L)   Chloride 108  96 - 112 (mEq/L)   CO2 22  19 - 32 (mEq/L)   Glucose, Bld 88  70 - 99 (mg/dL)   BUN 12  6 - 23 (mg/dL)   Creatinine, Ser 0.98  0.50 - 1.10 (mg/dL)   Calcium 8.7  8.4 - 11.9 (mg/dL)   Total Protein 6.6  6.0 - 8.3 (g/dL)   Albumin 3.6  3.5 - 5.2 (g/dL)   AST 37  0 - 37 (U/L)   ALT 42 (*) 0 - 35 (U/L)   Alkaline Phosphatase 100  39 - 117 (U/L)   Total Bilirubin 0.3  0.3 - 1.2 (mg/dL)   GFR calc non Af Amer >90  >90 (mL/min)   GFR calc Af Amer >90  >90 (mL/min)  URINALYSIS, ROUTINE W REFLEX MICROSCOPIC      Component Value Range   Color, Urine YELLOW  YELLOW    APPearance  CLEAR  CLEAR    Specific Gravity, Urine 1.039 (*) 1.005 - 1.030    pH 7.0  5.0 - 8.0    Glucose, UA NEGATIVE  NEGATIVE (mg/dL)   Hgb urine dipstick TRACE (*) NEGATIVE    Bilirubin Urine NEGATIVE  NEGATIVE    Ketones, ur NEGATIVE  NEGATIVE (mg/dL)   Protein, ur NEGATIVE  NEGATIVE (mg/dL)   Urobilinogen, UA 0.2  0.0 - 1.0 (mg/dL)   Nitrite NEGATIVE  NEGATIVE    Leukocytes, UA NEGATIVE  NEGATIVE   APTT      Component Value Range   aPTT 34  24 - 37 (seconds)  PROTIME-INR      Component Value Range   Prothrombin Time 13.0  11.6 - 15.2 (seconds)   INR 0.96  0.00 - 1.49   PREGNANCY, URINE      Component Value Range   Preg Test, Ur NEGATIVE  NEGATIVE   URINE MICROSCOPIC-ADD ON      Component Value Range   Squamous Epithelial / LPF MANY (*) RARE    RBC / HPF 0-2  <3 (RBC/hpf)  ANTITHROMBIN III      Component Value Range   AntiThromb III Func 96  75 - 120 (%)  HOMOCYSTEINE, SERUM      Component Value Range   Homocysteine-Norm 12.6  4.0 - 15.4 (umol/L)  DIC (DISSEMINATED INTRAVASCULAR COAGULATION) PANEL      Component Value Range   Prothrombin Time 13.4  11.6 - 15.2 (seconds)   INR 1.00  0.00 - 1.49    aPTT 28  24 - 37 (seconds)   Fibrinogen 309  204 - 475 (mg/dL)   D-Dimer, Quant 1.47 (*) 0.00 - 0.48 (ug/mL-FEU)   Platelets 53 (*) 150 - 400 (K/uL)   Smear Review NO SCHISTOCYTES SEEN    CBC      Component Value Range   WBC 23.6 (*) 4.0 - 10.5 (K/uL)   RBC 3.16 (*) 3.87 - 5.11 (MIL/uL)   Hemoglobin 8.3 (*) 12.0 - 15.0 (g/dL)   HCT 82.9 (*) 56.2 - 46.0 (%)   MCV 74.4 (*) 78.0 - 100.0 (fL)   MCH 26.3  26.0 - 34.0 (pg)   MCHC 35.3  30.0 - 36.0 (g/dL)   RDW 13.0 (*) 86.5 - 15.5 (%)   Platelets 39 (*) 150 - 400 (K/uL)  VITAMIN B12      Component Value Range   Vitamin B-12 337  211 -  911 (pg/mL)  FOLATE      Component Value Range   Folate 11.8    IRON AND TIBC      Component Value Range   Iron 252 (*) 42 - 135 (ug/dL)   TIBC 960  454 - 098 (ug/dL)   Saturation Ratios 92 (*)  20 - 55 (%)   UIBC 23 (*) 125 - 400 (ug/dL)  FERRITIN      Component Value Range   Ferritin 204  10 - 291 (ng/mL)  RETICULOCYTES      Component Value Range   Retic Ct Pct <0.4 (*) 0.4 - 3.1 (%)   RBC. 3.29 (*) 3.87 - 5.11 (MIL/uL)  TSH      Component Value Range   TSH 0.662  0.350 - 4.500 (uIU/mL)  HEMOGLOBIN A1C      Component Value Range   Hemoglobin A1C 7.4 (*) <5.7 (%)   Mean Plasma Glucose 166 (*) <117 (mg/dL)  COMPREHENSIVE METABOLIC PANEL      Component Value Range   Sodium 139  135 - 145 (mEq/L)   Potassium 3.9  3.5 - 5.1 (mEq/L)   Chloride 104  96 - 112 (mEq/L)   CO2 23  19 - 32 (mEq/L)   Glucose, Bld 95  70 - 99 (mg/dL)   BUN 11  6 - 23 (mg/dL)   Creatinine, Ser 1.19  0.50 - 1.10 (mg/dL)   Calcium 8.8  8.4 - 14.7 (mg/dL)   Total Protein 6.3  6.0 - 8.3 (g/dL)   Albumin 3.4 (*) 3.5 - 5.2 (g/dL)   AST 33  0 - 37 (U/L)   ALT 40 (*) 0 - 35 (U/L)   Alkaline Phosphatase 96  39 - 117 (U/L)   Total Bilirubin 0.3  0.3 - 1.2 (mg/dL)   GFR calc non Af Amer >90  >90 (mL/min)   GFR calc Af Amer >90  >90 (mL/min)  PATHOLOGIST SMEAR REVIEW      Component Value Range   Tech Review Reviewed by [place] R. Rund, [name]    GLUCOSE, CAPILLARY      Component Value Range   Glucose-Capillary 83  70 - 99 (mg/dL)  BASIC METABOLIC PANEL      Component Value Range   Sodium 138  135 - 145 (mEq/L)   Potassium 3.9  3.5 - 5.1 (mEq/L)   Chloride 101  96 - 112 (mEq/L)   CO2 26  19 - 32 (mEq/L)   Glucose, Bld 98  70 - 99 (mg/dL)   BUN 12  6 - 23 (mg/dL)   Creatinine, Ser 8.29  0.50 - 1.10 (mg/dL)   Calcium 9.3  8.4 - 56.2 (mg/dL)   GFR calc non Af Amer 73 (*) >90 (mL/min)   GFR calc Af Amer 84 (*) >90 (mL/min)  CBC      Component Value Range   WBC 28.7 (*) 4.0 - 10.5 (K/uL)   RBC 3.64 (*) 3.87 - 5.11 (MIL/uL)   Hemoglobin 9.4 (*) 12.0 - 15.0 (g/dL)   HCT 13.0 (*) 86.5 - 46.0 (%)   MCV 74.5 (*) 78.0 - 100.0 (fL)   MCH 25.8 (*) 26.0 - 34.0 (pg)   MCHC 34.7  30.0 - 36.0 (g/dL)   RDW 78.4  (*) 69.6 - 15.5 (%)   Platelets 43 (*) 150 - 400 (K/uL)  DIFFERENTIAL      Component Value Range   Neutrophils Relative 6 (*) 43 - 77 (%)   Band Neutrophils 3  0 - 10 (%)  Lymphocytes Relative 90 (*) 12 - 46 (%)   Monocytes Relative 1 (*) 3 - 12 (%)   Eosinophils Relative 0  0 - 5 (%)   Basophils Relative 0  0 - 1 (%)   WBC Morphology ATYPICAL MONONUCLEAR CELLS     RBC Morphology ELLIPTOCYTES     nRBC 0  0 (/100 WBC)   Metamyelocytes Relative 0     Myelocytes 0     Promyelocytes Absolute 0     Blasts 0    LACTATE DEHYDROGENASE      Component Value Range   LDH 492 (*) 94 - 250 (U/L)  URIC ACID      Component Value Range   Uric Acid, Serum 6.6  2.4 - 7.0 (mg/dL)  GLUCOSE, CAPILLARY      Component Value Range   Glucose-Capillary 93  70 - 99 (mg/dL)  GLUCOSE, CAPILLARY      Component Value Range   Glucose-Capillary 104 (*) 70 - 99 (mg/dL)  GLUCOSE, CAPILLARY      Component Value Range   Glucose-Capillary 112 (*) 70 - 99 (mg/dL)     ,D Caryn Bee

## 2011-06-19 NOTE — Progress Notes (Signed)
DAILY PROGRESS NOTE                              GENERAL INTERNAL MEDICINE [facility]  SUBJECTIVE: Mild shortness of breath, denies shortness of breath, denies headache.  OBJECTIVE: BP 102/64  Pulse 92  Temp(Src) 98.6 F (37 C) (Oral)  Resp 13  Ht 5\' 8"  (1.727 m)  Wt 131.543 kg (290 lb)  BMI 44.09 kg/m2  SpO2 96%  LMP 06/03/2011  Intake/Output Summary (Last 24 hours) at 06/19/11 1138 Last data filed at 06/19/11 0900  Gross per 24 hour  Intake    240 ml  Output      0 ml  Net    240 ml                      Weight change:  Physical Exam: General: Alert and awake oriented x3 not in any acute distress. HEENT: anicteric sclera, pupils equal reactive to light and accommodation CVS: S1-S2 heard, no murmur rubs or gallops Chest: clear to auscultation bilaterally, no wheezing rales or rhonchi Abdomen:  normal bowel sounds, soft, nontender, nondistended, no organomegaly Neuro: Cranial nerves II-XII intact, no focal neurological deficits Extremities: no cyanosis, no clubbing or edema noted bilaterally   Lab Results:  Basename 06/19/11 0510 06/18/11 0505  NA 138 139  K 3.9 3.9  CL 101 104  CO2 26 23  GLUCOSE 98 95  BUN 12 11  CREATININE 0.93 0.78  CALCIUM 9.3 8.8  MG -- --  PHOS -- --    Basename 06/18/11 0505 06/17/11 1314  AST 33 37  ALT 40* 42*  ALKPHOS 96 100  BILITOT 0.3 0.3  PROT 6.3 6.6  ALBUMIN 3.4* 3.6   No results found for this basename: LIPASE:2,AMYLASE:2 in the last 72 hours  Basename 06/19/11 0510 06/18/11 0505  WBC 28.7* 23.6*  NEUTROABS -- --  HGB 9.4* 8.3*  HCT 27.1* 23.5*  MCV 74.5* 74.4*  PLT 43* 39*   No results found for this basename: CKTOTAL:3,CKMB:3,CKMBINDEX:3,TROPONINI:3 in the last 72 hours No components found with this basename: POCBNP:3  Basename 06/17/11 1512 06/17/11 0939  DDIMER 2.37* 2.41*    Basename 06/17/11 1731  HGBA1C 7.4*   No results found for this basename:  CHOL:2,HDL:2,LDLCALC:2,TRIG:2,CHOLHDL:2,LDLDIRECT:2 in the last 72 hours  Basename 06/17/11 1731  TSH 0.662  T4TOTAL --  T3FREE --  THYROIDAB --    Basename 06/17/11 1731  VITAMINB12 337  FOLATE 11.8  FERRITIN 204  TIBC 275  IRON 252*  RETICCTPCT <0.4*    Micro Results: No results found for this or any previous visit (from the past 240 hour(s)).  Studies/Results: Ct Angio Chest W/cm &/or Wo Cm  06/17/2011  *RADIOLOGY REPORT*  Clinical Data: Shortness of breath and elevated D-dimer.  CT ANGIOGRAPHY CHEST  Technique:  Multidetector CT imaging of the chest using the standard protocol during bolus administration of intravenous contrast. Multiplanar reconstructed images including MIPs were obtained and reviewed to evaluate the vascular anatomy.  Contrast: OMNIPAQUE IOHEXOL 350 MG/ML SOLN  Comparison: 02/16/2011 chest radiograph  Findings: This is a technically satisfactory study.  Pulmonary emboli are identified in the proximal right upper lobe pulmonary artery, and segmental right lower lobe pulmonary arteries. The intraventricular septum does not appear flattened.  Mildly prominent mediastinal and right hilar lymph nodes are nonspecific but may be reactive.  The lungs are clear. There is no evidence of airspace disease, consolidation,  nodule or mass.  No acute or suspicious bony abnormalities are identified. The visualized upper abdomen is within normal limits.  IMPRESSION: Right-sided pulmonary emboli.  Mildly prominent mediastinal and right hilar lymph node show - nonspecific but may be reactive.  Critical Value/emergent results were called by telephone at the time of interpretation on 06/17/2011  at 2:05 p.m.  to  [name], who verbally acknowledged these results.  Original Report Authenticated By: Rosendo Gros, [name]   Medications: Scheduled Meds:    . enoxaparin (LOVENOX) injection  130 mg Subcutaneous Once  . enoxaparin (LOVENOX) injection  130 mg Subcutaneous Q12H  .  fentaNYL      . midazolam      . polyethylene glycol  17 g Oral Daily  . sodium chloride  3 mL Intravenous Q12H  . DISCONTD: enoxaparin (LOVENOX) injection  130 mg Subcutaneous Q12H   Continuous Infusions:  PRN Meds:.acetaminophen, acetaminophen, albuterol, albuterol, fentaNYL, midazolam, morphine injection, ondansetron (ZOFRAN) IV, ondansetron, oxyCODONE-acetaminophen, DISCONTD: HYDROcodone-acetaminophen  ASSESSMENT & PLAN: Principal Problem:  *Acute pulmonary embolism Active Problems:  Microcytosis  Leukocytosis  Thrombocytopenia  Anemia  Hilar lymphadenopathy   Acute PE -Patient started on Lovenox, weight starting her on Coumadin. -If patient has an active acute leukemia she will need Lovenox rather than Coumadin. -Hypercoagulability panel is pending (this was drawn before the Lovenox was given).  Leukocytosis -Smear from yesterday checked by the pathologist and labeled as reactive lymphocytosis. -Reactive leukocytosis versus acute leukemia. -Bone marrow biopsy today by IR. Flowcytometry, cytogenetics and FISH should be sent.  Thrombocytopenia -Bone marrow biopsy to be in today. -DIC panel does not shows normal fibrinogen, the high D. dimers is likely from the acute PE  Anemia -Microcytic anemia, iron studies showed iron of more than 250. -With anemia, thrombocytopenia and leukocytosis raises questions about bone marrow integrity, hematology consulted.  Diabetes mellitus type 2 -Hemoglobin A1c 7.4, diagnostic for diabetes according to ADA guidelines. -CBGs within normal limits, I will continue check CBGs, repeat hemoglobin A1c.   LOS: 2 days   , A 06/19/2011, 11:38 AM

## 2011-06-20 DIAGNOSIS — D7289 Other specified disorders of white blood cells: Secondary | ICD-10-CM

## 2011-06-20 DIAGNOSIS — I2699 Other pulmonary embolism without acute cor pulmonale: Secondary | ICD-10-CM

## 2011-06-20 DIAGNOSIS — R112 Nausea with vomiting, unspecified: Secondary | ICD-10-CM

## 2011-06-20 DIAGNOSIS — M311 Thrombotic microangiopathy: Secondary | ICD-10-CM

## 2011-06-20 LAB — CBC
Hemoglobin: 8.8 g/dL — ABNORMAL LOW (ref 12.0–15.0)
MCHC: 34.8 g/dL (ref 30.0–36.0)
RBC: 3.43 MIL/uL — ABNORMAL LOW (ref 3.87–5.11)
WBC: 25.8 10*3/uL — ABNORMAL HIGH (ref 4.0–10.5)

## 2011-06-20 LAB — PROTEIN S ACTIVITY: Protein S Activity: 71 % (ref 69–129)

## 2011-06-20 LAB — GLUCOSE, CAPILLARY
Glucose-Capillary: 108 mg/dL — ABNORMAL HIGH (ref 70–99)
Glucose-Capillary: 123 mg/dL — ABNORMAL HIGH (ref 70–99)
Glucose-Capillary: 104 mg/dL — ABNORMAL HIGH (ref 70–99)

## 2011-06-20 LAB — LUPUS ANTICOAGULANT PANEL
DRVVT: 27.2 secs (ref <45.1)
Lupus Anticoagulant: NOT DETECTED

## 2011-06-20 NOTE — Progress Notes (Signed)
DAILY PROGRESS NOTE                              GENERAL INTERNAL MEDIC[facility]  SUBJECTIVE: Mild shortness of breath, denies shortness of breath, denies headache.  OBJECTIVE: BP 121/70  Pulse 76  Temp(Src) 98.8 F (37.1 C) (Oral)  Resp 18  Ht 5\' 8"  (1.727 m)  Wt 131.543 kg (290 lb)  BMI 44.09 kg/m2  SpO2 99%  LMP 06/03/2011  Intake/Output Summary (Last 24 hours) at 06/20/11 1056 Last data filed at 06/20/11 0900  Gross per 24 hour  Intake    722 ml  Output      0 ml  Net    722 ml                      Weight change:  Physical Exam: General: Alert and awake oriented x3 not in any acute distress. HEENT: anicteric sclera, pupils equal reactive to light and accommodation CVS: S1-S2 heard, no murmur rubs or gallops Chest: clear to auscultation bilaterally, no wheezing rales or rhonchi Abdomen:  normal bowel sounds, soft, nontender, nondistended, no organomegaly Neuro: Cranial nerves II-XII intact, no focal neurological deficits Extremities: no cyanosis, no clubbing or edema noted bilaterally   Lab Results:  Basename 06/19/11 0510 06/18/11 0505  NA 138 139  K 3.9 3.9  CL 101 104  CO2 26 23  GLUCOSE 98 95  BUN 12 11  CREATININE 0.93 0.78  CALCIUM 9.3 8.8  MG -- --  PHOS -- --    Basename 06/18/11 0505 06/17/11 1314  AST 33 37  ALT 40* 42*  ALKPHOS 96 100  BILITOT 0.3 0.3  PROT 6.3 6.6  ALBUMIN 3.4* 3.6   No results found for this basename: LIPASE:2,AMYLASE:2 in the last 72 hours  Basename 06/20/11 0527 06/19/11 0510  WBC 25.8* 28.7*  NEUTROABS -- --  HGB 8.8* 9.4*  HCT 25.3* 27.1*  MCV 73.8* 74.5*  PLT 37* 43*   No results found for this basename: CKTOTAL:3,CKMB:3,CKMBINDEX:3,TROPONINI:3 in the last 72 hours No components found with this basename: POCBNP:3  Basename 06/17/11 1512  DDIMER 2.37*    Basename 06/17/11 1731  HGBA1C 7.4*   No results found for this basename: CHOL:2,HDL:2,LDLCALC:2,TRIG:2,CHOLHDL:2,LDLDIRECT:2 in the last  72 hours  Basename 06/17/11 1731  TSH 0.662  T4TOTAL --  T3FREE --  THYROIDAB --    Basename 06/17/11 1731  VITAMINB12 337  FOLATE 11.8  FERRITIN 204  TIBC 275  IRON 252*  RETICCTPCT <0.4*    Micro Results: No results found for this or any previous visit (from the past 240 hour(s)).  Studies/Results: Ct Angio Chest W/cm &/or Wo Cm  06/17/2011  *RADIOLOGY REPORT*  Clinical Data: Shortness of breath and elevated D-dimer.  CT ANGIOGRAPHY CHEST  Technique:  Multidetector CT imaging of the chest using the standard protocol during bolus administration of intravenous contrast. Multiplanar reconstructed images including MIPs were obtained and reviewed to evaluate the vascular anatomy.  Contrast: OMNIPAQUE IOHEXOL 350 MG/ML SOLN  Comparison: 02/16/2011 chest radiograph  Findings: This is a technically satisfactory study.  Pulmonary emboli are identified in the proximal right upper lobe pulmonary artery, and segmental right lower lobe pulmonary arteries. The intraventricular septum does not appear flattened.  Mildly prominent mediastinal and right hilar lymph nodes are nonspecific but may be reactive.  The lungs are clear. There is no evidence of airspace disease, consolidation, nodule or mass.  No acute or suspicious bony abnormalities are identified. The visualized upper abdomen is within normal limits.  IMPRESSION: Right-sided pulmonary emboli.  Mildly prominent mediastinal and right hilar lymph node show - nonspecific but may be reactive.  Critical Value/emergent results were called by telephone at the time of interpretation on 06/17/2011  at 2:05 p.m.  to  [facility], who verbally acknowledged these results.  Original Report Authenticated By: Rosendo Gros, [name]   Medications: Scheduled Meds:    . enoxaparin (LOVENOX) injection  130 mg Subcutaneous Q12H  . fentaNYL      . midazolam      . polyethylene glycol  17 g Oral Daily  . sodium chloride  3 mL Intravenous Q12H   Continuous  Infusions:  PRN Meds:.acetaminophen, acetaminophen, albuterol, albuterol, fentaNYL, midazolam, morphine injection, ondansetron (ZOFRAN) IV, ondansetron, oxyCODONE-acetaminophen  ASSESSMENT & PLAN: Principal Problem:  *Acute pulmonary embolism Active Problems:  Microcytosis  Leukocytosis  Thrombocytopenia  Anemia  Hilar lymphadenopathy  Interval history 46 year old African American female with an no significant past medical history apart from arthritis and obesity. Patient came to the hospital complaining about vague symptoms including shortness of breath and easy fatigability. Patient was found to have acute PE and leukocytosis along with thrombocytopenia and anemia. Her peripheral blood smear was very suspicious for blasts versus reactive lymphocytosis. Oncology was called, bone marrow biopsy was done 06/19/2011, results are pending. Patient is on Lovenox for PE and platelets count hand in between 30 and 50,000. She is probably not good candidate for Coumadin, we will need Dr. Arline Asp advise.  Acute PE -Patient started on Lovenox, weight starting her on Coumadin. -Hypercoagulability panel is pending (this was drawn before the Lovenox was given). -She was not started on Coumadin, secondary to her low platelets, currently on Coumadin. -If patient has an active acute leukemia she will need Lovenox rather than Coumadin. -Dr. Arline Asp please advise about the choice of anticoagulation.  Leukocytosis -Smear from yesterday checked by the pathologist and labeled as reactive lymphocytosis. -Reactive leukocytosis versus acute leukemia. -Bone marrow biopsy today by IR. Flowcytometry, cytogenetics and FISH should be sent.  Thrombocytopenia -Bone marrow biopsy done on 06/19/2011 -DIC panel shows normal fibrinogen, the high D. dimers is likely from the acute PE  Anemia -Microcytic anemia, iron studies showed iron of more than 250. -With anemia, thrombocytopenia and leukocytosis raises questions  about bone marrow integrity, hematology consulted.  Diabetes mellitus type 2 -Hemoglobin A1c 7.4, diagnostic for diabetes according to ADA guidelines. -CBGs within normal limits, I will continue check CBGs, repeat hemoglobin A1c.   LOS: 3 days   , A 06/20/2011, 10:56 AM

## 2011-06-20 NOTE — Progress Notes (Signed)
ANTICOAGULATION CONSULT NOTE - Follow Up Consult  Pharmacy Consult for lovenox Indication: pulmonary embolus  Vital Signs: Temp: 98.8 F (37.1 C) (05/21 0600) Temp src: Oral (05/21 0600) BP: 121/70 mmHg (05/21 0600) Pulse Rate: 76  (05/21 0600)  Labs:  Basename 06/20/11 0527 06/19/11 0510 06/18/11 0505 06/17/11 1512 06/17/11 1314  HGB 8.8* 9.4* -- -- --  HCT 25.3* 27.1* 23.5* -- --  PLT 37* 43* 39* -- --  APTT -- -- -- 28 34  LABPROT -- -- -- 13.4 13.0  INR -- -- -- 1.00 0.96  HEPARINUNFRC -- -- -- -- --  CREATININE -- 0.93 0.78 -- 0.78  CKTOTAL -- -- -- -- --  CKMB -- -- -- -- --  TROPONINI -- -- -- -- --    Estimated Creatinine Clearance: 108.5 ml/min (by C-G formula based on Cr of 0.93).  Assessment: 45 yof on lovenox for PE. S/p bone marrow biopsy yesterday to r/o malignancy. Lovenox dose is appropriate based on weight and renal function. Pt remains anemic and severely thrombocytopenic. No bleeding noted. Initiation of warfarin dependent upon bone marrow biopsy results.   Goal of Therapy:  Anti-Xa level 0.6-1.2 units/ml 4hrs after LMWH dose given Monitor platelets by anticoagulation protocol: Yes   Plan:  1. Continue lovenox 130mg  SQ Q12H 2. F/u CBC 3. F/u bone marrow biopsy results  , Drake Leach 06/20/2011,8:28 AM

## 2011-06-21 DIAGNOSIS — R112 Nausea with vomiting, unspecified: Secondary | ICD-10-CM

## 2011-06-21 DIAGNOSIS — D7289 Other specified disorders of white blood cells: Secondary | ICD-10-CM

## 2011-06-21 DIAGNOSIS — C9100 Acute lymphoblastic leukemia not having achieved remission: Secondary | ICD-10-CM

## 2011-06-21 DIAGNOSIS — M311 Thrombotic microangiopathy: Secondary | ICD-10-CM

## 2011-06-21 DIAGNOSIS — I2699 Other pulmonary embolism without acute cor pulmonale: Secondary | ICD-10-CM

## 2011-06-21 LAB — DIFFERENTIAL
Band Neutrophils: 2 % (ref 0–10)
Blasts: 49 %
Lymphocytes Relative: 42 % (ref 12–46)
Lymphs Abs: 11.7 10*3/uL — ABNORMAL HIGH (ref 0.7–4.0)
Metamyelocytes Relative: 0 %
Promyelocytes Absolute: 0 %

## 2011-06-21 LAB — CBC
HCT: 25 % — ABNORMAL LOW (ref 36.0–46.0)
MCHC: 34.4 g/dL (ref 30.0–36.0)
Platelets: 32 10*3/uL — ABNORMAL LOW (ref 150–400)
RDW: 17.7 % — ABNORMAL HIGH (ref 11.5–15.5)

## 2011-06-21 LAB — CARDIOLIPIN ANTIBODIES, IGG, IGM, IGA
Anticardiolipin IgA: 2 APL U/mL — ABNORMAL LOW (ref <22)
Anticardiolipin IgM: 2 MPL U/mL — ABNORMAL LOW (ref <11)

## 2011-06-21 LAB — PATHOLOGIST SMEAR REVIEW

## 2011-06-21 LAB — BETA-2-GLYCOPROTEIN I ABS, IGG/M/A: Beta-2 Glyco I IgG: 0 G Units (ref <20)

## 2011-06-21 LAB — PROTEIN C, TOTAL: Protein C, Total: 65 % — ABNORMAL LOW (ref 72–160)

## 2011-06-21 MED ORDER — ONDANSETRON HCL 4 MG PO TABS: 4.0000 mg | ORAL_TABLET | Freq: Four times a day (QID) | ORAL | Status: AC | PRN

## 2011-06-21 MED ORDER — POLYETHYLENE GLYCOL 3350 17 G PO PACK: 17.0000 g | PACK | Freq: Every day | ORAL | Status: AC

## 2011-06-21 MED ORDER — ENOXAPARIN SODIUM 100 MG/ML ~~LOC~~ SOLN: 130.0000 mg | Freq: Two times a day (BID) | SUBCUTANEOUS | Status: DC

## 2011-06-21 MED ORDER — ACETAMINOPHEN 325 MG PO TABS: 650.0000 mg | ORAL_TABLET | Freq: Four times a day (QID) | ORAL | Status: DC | PRN

## 2011-06-21 NOTE — Progress Notes (Signed)
Report called to [facility]. Waiting on care link to transport. Madelin Rear RN, CMSRN

## 2011-06-21 NOTE — Discharge Summary (Addendum)
PATIENT DETAILS Name: Lynn [name] Age: 46 y.o. Sex: female Date of Birth: [birth year] MRN: [id number]. Admit Date: 06/17/2011 Admitting Physician: Clydia Llano, MD WUJ:[id number],GNFAO August Saucer, MD, MD  PRIMARY DISCHARGE DIAGNOSIS:  Principal Problem:  *Acute pulmonary embolism Active Problems: Acute lymphoblastic leukemia  Microcytosis  Leukocytosis  Thrombocytopenia  Anemia  Hilar lymphadenopathy      PAST MEDICAL HISTORY: Past Medical History  Diagnosis Date  . Hypertension     DISCHARGE MEDICATIONS: Medication List  As of 06/21/2011  2:35 PM   STOP taking these medications         ALEVE PO      ibuprofen 200 MG tablet         TAKE these medications         acetaminophen 325 MG tablet   Commonly known as: TYLENOL   Take 2 tablets (650 mg total) by mouth every 6 (six) hours as needed for pain or fever (or Fever >/= 101).      albuterol 108 (90 BASE) MCG/ACT inhaler   Commonly known as: PROVENTIL HFA;VENTOLIN HFA   Inhale 2 puffs into the lungs every 4 (four) hours as needed. For shortness of breath      enoxaparin 100 MG/ML injection   Commonly known as: LOVENOX   Inject 1.3 mLs (130 mg total) into the skin every 12 (twelve) hours.      ondansetron 4 MG tablet   Commonly known as: ZOFRAN   Take 1 tablet (4 mg total) by mouth every 6 (six) hours as needed for nausea.      polyethylene glycol packet   Commonly known as: MIRALAX / GLYCOLAX   Take 17 g by mouth daily.             BRIEF HPI:  See H&P, Labs, Consult and Test reports for all details in brief, patient was admitted for shortness of breath a few days prior to admission. She was initially seen in a local urgent care clinic, where she had a d-dimer that was done which was positive, she was then subsequently sent over to the emergency room for further evaluation. A subsequent CTA of the chest showed acute pulmonary embolism with lymphadenopathy.  CONSULTATIONS:   hematology/oncology  PERTINENT  RADIOLOGIC STUDIES: Ct Angio Chest W/cm &/or Wo Cm  06/17/2011  *RADIOLOGY REPORT*  Clinical Data: Shortness of breath and elevated D-dimer.  CT ANGIOGRAPHY CHEST  Technique:  Multidetector CT imaging of the chest using the standard protocol during bolus administration of intravenous contrast. Multiplanar reconstructed images including MIPs were obtained and reviewed to evaluate the vascular anatomy.  Contrast: OMNIPAQUE IOHEXOL 350 MG/ML SOLN  Comparison: 02/16/2011 chest radiograph  Findings: This is a technically satisfactory study.  Pulmonary emboli are identified in the proximal right upper lobe pulmonary artery, and segmental right lower lobe pulmonary arteries. The intraventricular septum does not appear flattened.  Mildly prominent mediastinal and right hilar lymph nodes are nonspecific but may be reactive.  The lungs are clear. There is no evidence of airspace disease, consolidation, nodule or mass.  No acute or suspicious bony abnormalities are identified. The visualized upper abdomen is within normal limits.  IMPRESSION: Right-sided pulmonary emboli.  Mildly prominent mediastinal and right hilar lymph node show - nonspecific but may be reactive.  Critical Value/emergent results were called by telephone at the time of interpretation on 06/17/2011  at 2:05 p.m.  to  [facility], who verbally acknowledged these results.  Original Report Authenticated By: Rosendo Gros, [name]  Ct Biopsy  06/19/2011  *RADIOLOGY REPORT*  Clinical data: Anemia, thrombocytopenia, leukocytosis  CT GUIDED DEEP ILIAC BONE ASPIRATION AND CORE BIOPSY:  Technique: Patient was placed supine on the CT gantry and limited axial scans through the pelvis were obtained. Appropriate skin entry site was identified. Skin site was marked, prepped with Betadine,   draped in usual sterile fashion, and infiltrated locally with 1% lidocaine.  Intravenous Fentanyl and Versed were administered as conscious sedation during continuous  cardiorespiratory monitoring by the radiology RN, with a total moderate sedation time of   22 minutes.  Under CT fluoroscopic guidance an 11-gauge Cook trocar bone needle was advanced into the right iliac bone just lateral to the sacroiliac joint. Once needle tip position was confirmed, aspiration was attempted but there is no return.  Multiple   core samples were obtained.  Postprocedure scans show no hematoma or fracture. Patient tolerated procedure well, with no immediate complication.  IMPRESSION:  1. Technically successful CT guided right iliac bone core  biopsy.  Original Report Authenticated By: Osa Craver, [name]   Bone Marrow Flow Cytometry - B LYMPHOBLASTIC LEUKEMIA/LYMPHOMA (ACUTE LYMPHOBLASTIC LEUKEMIA).    PERTINENT LAB RESULTS: CBC:  Basename 06/21/11 0527 06/20/11 0527  WBC 27.8* 25.8*  HGB 8.6* 8.8*  HCT 25.0* 25.3*  PLT 32* 37*   CMET CMP     Component Value Date/Time   NA 138 06/19/2011 0510   K 3.9 06/19/2011 0510   CL 101 06/19/2011 0510   CO2 26 06/19/2011 0510   GLUCOSE 98 06/19/2011 0510   BUN 12 06/19/2011 0510   CREATININE 0.93 06/19/2011 0510   CALCIUM 9.3 06/19/2011 0510   PROT 6.3 06/18/2011 0505   ALBUMIN 3.4* 06/18/2011 0505   AST 33 06/18/2011 0505   ALT 40* 06/18/2011 0505   ALKPHOS 96 06/18/2011 0505   BILITOT 0.3 06/18/2011 0505   GFRNONAA 73* 06/19/2011 0510   GFRAA 84* 06/19/2011 0510    GFR Estimated Creatinine Clearance: 108.5 ml/min (by C-G formula based on Cr of 0.93). No results found for this basename: LIPASE:2,AMYLASE:2 in the last 72 hours No results found for this basename: CKTOTAL:3,CKMB:3,CKMBINDEX:3,TROPONINI:3 in the last 72 hours No components found with this basename: POCBNP:3 No results found for this basename: DDIMER:2 in the last 72 hours No results found for this basename: HGBA1C:2 in the last 72 hours No results found for this basename: CHOL:2,HDL:2,LDLCALC:2,TRIG:2,CHOLHDL:2,LDLDIRECT:2 in the last 72 hours No results  found for this basename: TSH,T4TOTAL,FREET3,T3FREE,THYROIDAB in the last 72 hours No results found for this basename: VITAMINB12:2,FOLATE:2,FERRITIN:2,TIBC:2,IRON:2,RETICCTPCT:2 in the last 72 hours Coags: No results found for this basename: PT:2,INR:2 in the last 72 hours Microbiology: No results found for this or any previous visit (from the past 240 hour(s)).   BRIEF HOSPITAL COURSE:   Principal Problem:  *Acute pulmonary embolism -Patient was admitted with shortness of breath, a CT angiogram of the chest done on admission did confirm pulmonary embolism. She was then started on therapeutic dosing of Lovenox. Currently she is significantly better, does not have any ongoing shortness of breath chest pain. She is currently being maintained on Lovenox given acute Lymphoblastic leukemia. Heart cytopenia continues to be an issue, however her platelet counts are in the low 30s, she has noticed signs of bleeding clinically. We will defer further treatment choice to the oncology team at [facility].  Acute lymphoblastic leukemia (ALL) -On admission patient was found to have both leukocytosis and thrombocytopenia. She had no other explanation for these numbers, hematology/oncology was  consulted, subsequently a bone marrow biopsy was done, these have now come positive for acute lymphoblastic leukemia. She continues to have persistent leukocytosis and thrombocytopenia. On admission WBC count was 27.7 thousand and her platelet count was 54, on discharge her WBC count is 27.8 thousand and her platelet count is 32,000. Please note clinically she does not have any stigmata of bleeding. Dr. Arline Asp from the cancer center was consulted, he has since recommended that this patient be transferred to [facility] for chemotherapy. I have spoken with Dr. Ellis-oncology on-call at [facility] today, clinical history and laboratory data have been discussed, she is crazy except that the patient under his  service as a transfer. -Please note her uric acid count was 6.6 and her LDH  Thrombocytopenia, anemia -Likely secondary to acute lymphoblastic leukemia  History of bronchial asthma -Stable, lungs clinically clear  TODAY-DAY OF DISCHARGE:  Subjective:   Lynn [name] today has no headache,no chest abdominal pain,no new weakness tingling or numbness. She denies any significant complaints to me today  Objective:   Blood pressure 149/98, pulse 103, temperature 97.7 F (36.5 C), temperature source Oral, resp. rate 20, height 5\' 8"  (1.727 m), weight 131.543 kg (290 lb), last menstrual period 06/03/2011, SpO2 100.00%.  Intake/Output Summary (Last 24 hours) at 06/21/11 1435 Last data filed at 06/21/11 1300  Gross per 24 hour  Intake    490 ml  Output      0 ml  Net    490 ml    Exam Awake Alert, Oriented *3, No new F.N deficits, Normal affect [place].AT,PERRAL Supple Neck,No JVD, No cervical lymphadenopathy appriciated.  Symmetrical Chest wall movement, Good air movement bilaterally, CTAB RRR,No Gallops,Rubs or new Murmurs, No Parasternal Heave +ve B.Sounds, Abd Soft, Non tender, No organomegaly appriciated, No rebound -guarding or rigidity. No Cyanosis, Clubbing or edema, No new Rash or bruise  DISPOSITION: Transfer to [facility]  DISCHARGE INSTRUCTIONS:    -Follow with PCP in 1 week after discharge from [facility]   Total Time spent on discharge equals 45 minutes.  SignedJeoffrey Massed 06/21/2011 2:35 PM

## 2011-06-21 NOTE — Progress Notes (Signed)
Heme-Oncology follow-up note:  Bone marrow from 06/19/2011 discussed with pathologist [facility].  Bone marrow was packed with malignant hemapoietic cells which on immunotyping are prolymphoblastic B-cells.  Official report pending.  Case discussed with hospitalists who will arrange for patient to be transferred to [facility] for further care and induction chemotherapy.  Let me know if I can be of further help.  Pager [phone].  Samul Dada 06/21/2011 9:58 AM

## 2011-06-21 NOTE — Care Management Note (Signed)
    Page 1 of 1   06/21/2011     5:11:13 PM   CARE MANAGEMENT NOTE 06/21/2011  Patient:  NAKYA, WEYAND   Account Number:  [id number]  Date Initiated:  06/21/2011  Documentation initiated by:  Letha Cape  Subjective/Objective Assessment:   dx acute pe, acute leukemia  admit- lives alone.     Action/Plan:   transfer to [facility]   Anticipated DC Date:  06/21/2011   Anticipated DC Plan:  ACUTE TO ACUTE TRANS      DC Planning Services  CM consult      Choice offered to / List presented to:             Status of service:  Completed, signed off Medicare Important Message given?   (If response is "NO", the following Medicare IM given date fields will be blank) Date Medicare IM given:   Date Additional Medicare IM given:    Discharge Disposition:  ACUTE TO ACUTE TRANS  Per UR Regulation:  Reviewed for med. necessity/level of care/duration of stay  If discussed at Long Length of Stay Meetings, dates discussed:    Comments:  06/21/11 17:09 Letha Cape RN, BSN [phone] patient lives alone, patient for transfer to [facility] for acute leukemia.  MD accepted patient in transfer to [facility].

## 2011-06-21 NOTE — Progress Notes (Addendum)
CRITICAL VALUE ALERT  Critical value received:  Blast cells from diff. 49%  Date of notification:  06/21/11  Time of notification:  1458  Critical value read back:yes  Nurse who received alert:  Madelin Rear RN, CMSRN   MD notified (1st page):  Algis Downs PA  Time of first page:  1458  MD notified (2nd page):  Time of second page:  Responding MD:  Algis Downs PA  Time MD responded:  [phone]

## 2011-07-04 HISTORY — PX: CRANIOTOMY: SHX93

## 2011-07-04 LAB — CHROMOSOME ANALYSIS, BONE MARROW

## 2011-07-06 DIAGNOSIS — I10 Essential (primary) hypertension: Secondary | ICD-10-CM | POA: Insufficient documentation

## 2011-07-12 ENCOUNTER — Other Ambulatory Visit: Payer: Self-pay | Admitting: *Deleted

## 2011-07-12 ENCOUNTER — Telehealth: Payer: Self-pay | Admitting: *Deleted

## 2011-07-12 DIAGNOSIS — D72829 Elevated white blood cell count, unspecified: Secondary | ICD-10-CM

## 2011-07-12 NOTE — Telephone Encounter (Signed)
Received call from Angie RN/[facility] stating that pt needs labs:  Cbc/diff & CMP q mon & thurs starting 07/17/11.  She will fax orders & MD notes to Dr Arline Asp.  Orders placed in chart & routed to scheduler to call [facility]/Angie @ [phone].  Discussed with Dr.Murinson.

## 2011-07-13 ENCOUNTER — Telehealth: Payer: Self-pay | Admitting: Oncology

## 2011-07-13 ENCOUNTER — Other Ambulatory Visit: Payer: Self-pay

## 2011-07-13 NOTE — Telephone Encounter (Signed)
s/w angie and gave her an earlier time per [facility] b as she may need blood   aom

## 2011-07-13 NOTE — Telephone Encounter (Signed)
per myrtle we need to see this pt for labs on m/thur for [facility].  she has placed orders and i have lm for angie for the labs on monday 6/17.

## 2011-07-14 ENCOUNTER — Encounter (HOSPITAL_COMMUNITY)
Admission: RE | Admit: 2011-07-14 | Discharge: 2011-07-14 | Disposition: A | Discharge status: Home / Self Care | Payer: 59 | Source: Ambulatory Visit | Attending: Oncology | Admitting: Oncology

## 2011-07-17 ENCOUNTER — Ambulatory Visit: Payer: 59

## 2011-07-17 ENCOUNTER — Other Ambulatory Visit: Payer: 59 | Admitting: Lab

## 2011-07-17 ENCOUNTER — Telehealth: Payer: Self-pay | Admitting: Oncology

## 2011-07-17 ENCOUNTER — Telehealth: Payer: Self-pay

## 2011-07-17 NOTE — Telephone Encounter (Signed)
[facility] called and the pt was still in the hosp ans being d/c today and will keep thurs 6/20 appt   aom

## 2011-07-17 NOTE — Telephone Encounter (Signed)
Trying to call to verify she received this AM appt for CBC with possible transfusion. Her home number was invalid, and Ms Raul Del number was not accepting incoming calls. Her work will try to get in touch with her family for [place].

## 2011-07-20 ENCOUNTER — Telehealth: Payer: Self-pay

## 2011-07-20 ENCOUNTER — Other Ambulatory Visit: Payer: 59 | Admitting: Lab

## 2011-07-20 ENCOUNTER — Other Ambulatory Visit: Payer: Self-pay

## 2011-07-20 ENCOUNTER — Other Ambulatory Visit (HOSPITAL_BASED_OUTPATIENT_CLINIC_OR_DEPARTMENT_OTHER): Payer: 59 | Admitting: Lab

## 2011-07-20 ENCOUNTER — Encounter: Payer: Self-pay | Admitting: Oncology

## 2011-07-20 ENCOUNTER — Ambulatory Visit (HOSPITAL_BASED_OUTPATIENT_CLINIC_OR_DEPARTMENT_OTHER): Payer: 59 | Admitting: Oncology

## 2011-07-20 ENCOUNTER — Ambulatory Visit: Payer: 59

## 2011-07-20 VITALS — BP 121/72 | HR 82 | Temp 98.1°F | Ht 68.0 in | Wt 264.0 lb

## 2011-07-20 DIAGNOSIS — Z86711 Personal history of pulmonary embolism: Secondary | ICD-10-CM

## 2011-07-20 DIAGNOSIS — C9100 Acute lymphoblastic leukemia not having achieved remission: Secondary | ICD-10-CM

## 2011-07-20 DIAGNOSIS — I2699 Other pulmonary embolism without acute cor pulmonale: Secondary | ICD-10-CM

## 2011-07-20 DIAGNOSIS — D72829 Elevated white blood cell count, unspecified: Secondary | ICD-10-CM

## 2011-07-20 LAB — CBC WITH DIFFERENTIAL/PLATELET
BASO%: 1.5 % (ref 0.0–2.0)
Eosinophils Absolute: 0.1 10*3/uL (ref 0.0–0.5)
HCT: 31.4 % — ABNORMAL LOW (ref 34.8–46.6)
MCHC: 32.8 g/dL (ref 31.5–36.0)
MONO#: 0.7 10*3/uL (ref 0.1–0.9)
NEUT#: 4.6 10*3/uL (ref 1.5–6.5)
NEUT%: 76.5 % (ref 38.4–76.8)
RBC: 3.5 10*6/uL — ABNORMAL LOW (ref 3.70–5.45)
WBC: 6.1 10*3/uL (ref 3.9–10.3)
lymph#: 0.6 10*3/uL — ABNORMAL LOW (ref 0.9–3.3)

## 2011-07-20 LAB — COMPREHENSIVE METABOLIC PANEL
ALT: 27 U/L (ref 0–35)
CO2: 28 mEq/L (ref 19–32)
Calcium: 8.6 mg/dL (ref 8.4–10.5)
Chloride: 105 mEq/L (ref 96–112)
Sodium: 143 mEq/L (ref 135–145)
Total Protein: 6.1 g/dL (ref 6.0–8.3)

## 2011-07-20 LAB — MAGNESIUM: Magnesium: 1.9 mg/dL (ref 1.5–2.5)

## 2011-07-20 NOTE — Progress Notes (Signed)
CC:   [name], MD [name], MD  PROBLEM LIST: 1. 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  Subsequent bone marrow at [facility] on 06/22/2011 showed 98% blasts     and peripheral blood showed 71% blasts.  The patient received     induction treatment with dasatinib (Sprycel) and Decadron.  Her     hospital course was complicated by the development of a posterior     fossa subdural hematoma requiring a suboccipital craniotomy and     evacuation of the hematoma on 07/04/2011.  The patient also had     neutropenic fever with negative cultures  covered with broad-     spectrum antibiotics.  She had some headaches, vaginal bleeding and     transaminitis.  Admission to [facility] was from 06/21/2011 through 07/17/2011.  Bone marrow carried     out on 07/06/2011 showed a hypocellular bone marrow with no     evidence for ALL. 2. Pulmonary emboli involving the right upper and right lower lobes     with positive CT chest angiogram on 06/17/2011 and negative     Dopplers. 3. Development of subdural hematoma involving the posterior fossa when     the patient was hospitalized at [facility], status post suboccipital craniotomy and evacuation     of hematoma on 07/04/2011. 4. History of hypertension. 5. Morbid obesity. 6. Arthritis involving the right hip. 7. History of palpitations. 8. History of migraine headaches. 9. Placement of inferior vena cava Greenfield filter on 07/07/2011. 10.Right-sided Port-A-Cath placed at [facility] on 07/12/2011.  MEDICATIONS: 1. Tylenol 650 mg every 6 hours as needed. 2. Fioricet 50/325/40 one to two tablets every 4 hours as needed for     headaches. 3. Dasatinib (Sprycel)  100 mg daily for treatment of ALL. 4. Zofran 4 mg every 8 hours as needed for nausea and vomiting. 5. Bactrim DS 800/160 mg 1 tablet b.i.d. on Mondays, Wednesdays and     Fridays.  HISTORY:  Lynn [name] is a 46 year old African American female whom I am seeing today at the [facility] for the first time after our initial hospital consultation on 06/18/2011.  At that time, I was asked to see the patient for anemia, thrombocytopenia, elevated white count and circulating blasts.  Bone marrow carried out at [facility] on 06/19/2011 showed approximately 71% blasts which were typed as B-cell ALL.  The patient had been admitted to [facility] on 05/18 with shortness of breath.  A CT chest angiogram showed pulmonary emboli involving the right upper and right lower lobes.  Dopplers were negative.  The patient was on either heparin or Lovenox.  When the bone marrow results showed that the patient had acute leukemia, she was transferred to [facility].  They started the patient off on a protocol CALGB 11914 utilizing dasatinib and Decadron.  The patient has had an excellent response.  During that admission, she developed a posterior fossa subdural hematoma that required evacuation on 07/04/2011.  In addition, an inferior vena cava Greenfield filter was placed on 07/07/2011 and the patient also had the placement of a right- sided Port-A-Cath  on 07/12/2011.  She was discharged from [facility] on Monday June 17th.  She seems to be doing fairly well at the present time.  She is here today with her sister Zella Ball with whom she is now living. They are living in a one level house.  Zella Ball is a Engineer, site, currently on summer vacation.  The patient has a 22 year old daughter and a 38 year old son who are living with their father in [place].  The patient is not driving.  The patient has some discomfort in her head related to her surgery for which she is taking the  Fioricet.  She denies any infection, fever, bleeding or bruising problems.  She denies any breathing problems, swelling of the legs or trouble with eating. Appetite is fair.  She has lost about 25 pounds.  At one time her weight was 290 pounds and today it is 264 pounds.  She notes some unsteadiness of gait, but denies any falls.  All things considered, she is doing extremely well.  PHYSICAL EXAM:  The patient is lethargic, but in no acute distress.  She is oriented and appropriate.  Weight is 264 pounds, height 5 feet 8 inches, body surface area 2.4 m2.  Blood pressure 121/72.  Other vital signs are normal.  She is afebrile.  She has some staples in the posterior midline where she had her craniotomy.  There was also a drain in the right frontal area that either has stables or has been sutured. Everything looks fine without inflammation or infection.  No scleral icterus.  Pupillary and extraocular movements are normal.  Mouth and pharynx benign.  The possibility of some slight thrush cannot be completely excluded.  No ulcers.  No adenopathy in the neck.  The patient has had a right-sided Port-A-Cath placed.  Incisions look fine. Heart/Lungs:  Normal.  Rhythm is regular.  Abdomen:  Obese.  There is a lot of bruising over the abdomen from the patient's Lovenox injections which she was receiving previously for treatment of her pulmonary emboli.  No organomegaly or masses.  Extremities:  No peripheral edema. She has some ecchymoses over the volar surface of her left arm. Neurologic:  Grossly normal.  LABORATORY DATA:  Today, white count 6.1, ANC 4.6, hemoglobin 10.3, hematocrit 31.4, platelets 308,000.  Differential shows 76% neutrophils, 10% lymphocytes, 11% monocytes.  Chemistries and magnesium are pending. In reviewing the patient's lab data from her admission at [facility] in mid May, the lupus anticoagulant was not detected.  Anticardiolipin antibodies were negative.  Beta 2 glycoprotein  antibodies were negative. Antithrombin 3 was 96%.  Protein C activity was 111%, however protein C total was 65%.  Protein S activity was 71%.  Protein S total was 67%. Homocystine level was 12.6%.  Leiden Factor V mutation was not detected. I do not see the results of the prothrombin II gene mutation.  IMPRESSION AND PLAN:  Clinically Rowene is doing well.  She certainly has been through a lot over the past month, specifically pulmonary emboli, diagnosis of B-cell acute lymphoblastic leukemia associated with the [place] chromosome and a subdural hematoma.  She seems to be doing well.  Her most recent bone marrow from [facility] on 07/06/2011 was hypocellular with no evidence for acute leukemia.  Blood counts today are satisfactory,  The patient does not require transfusion.  We were asked to check CBCs every Mondays and Thursdays and to transfuse red cells for hemoglobin less than 9.0, and to transfuse platelets for a  platelet count of less than 20,000. All blood products need to be irradiated.  Premeds were recommended for transfusions.  This would consist of Tylenol 650 mg p.o. and Benadryl 25 mg p.o.  The patient tells me that she has an appointment to be seen by the neurosurgical service on Monday June 24th and to be seen by Dr. [name] on Thursday June 27th.  I believe that at some point as part of her protocol she will be receiving intravenous chemotherapy.  Because the patient will be at [facility] next Monday and Thursday, we will hold off on any blood work.  We are happy to draw any blood work as needed.  I have not ordered any interim blood work for the moment.  We will plan to see Ellamae again in approximately 1 month at which time we will check CBC, chemistries and magnesium.  Today's session was quite extended lasting over an hour in order for me to obtain and review information from [facility] and the information from her [facility] admission.  The patient was given a copy of her CBC today.    ______________________________ Samul Dada, [name] DSM/MEDQ  D:  07/20/2011  T:  07/20/2011  Job:  [id number]

## 2011-07-20 NOTE — Progress Notes (Signed)
This office note has been dictated.  #[id number]

## 2011-07-20 NOTE — Telephone Encounter (Signed)
Rosalita Chessman from [facility] called to let us know pt is now a client with them. Their number is [phone].

## 2011-07-21 ENCOUNTER — Telehealth: Payer: Self-pay | Admitting: Oncology

## 2011-07-21 ENCOUNTER — Encounter: Payer: Self-pay | Admitting: Oncology

## 2011-07-21 NOTE — Progress Notes (Signed)
Prothrombin II  gene mutation was not detected on 07/20/2011.

## 2011-07-21 NOTE — Telephone Encounter (Signed)
not a wrking #,placed note for appt on 6/24 to get a new # and send pt in to get a new sch    aom

## 2011-07-23 ENCOUNTER — Emergency Department (HOSPITAL_COMMUNITY): Payer: 59

## 2011-07-23 ENCOUNTER — Emergency Department (HOSPITAL_COMMUNITY)
Admission: EM | Admit: 2011-07-23 | Discharge: 2011-07-24 | Disposition: A | Discharge status: Home / Self Care | Payer: 59 | Attending: Emergency Medicine | Admitting: Emergency Medicine

## 2011-07-23 ENCOUNTER — Encounter (HOSPITAL_COMMUNITY): Payer: Self-pay | Admitting: Emergency Medicine

## 2011-07-23 DIAGNOSIS — C9100 Acute lymphoblastic leukemia not having achieved remission: Secondary | ICD-10-CM | POA: Insufficient documentation

## 2011-07-23 DIAGNOSIS — Z79899 Other long term (current) drug therapy: Secondary | ICD-10-CM | POA: Insufficient documentation

## 2011-07-23 DIAGNOSIS — R110 Nausea: Secondary | ICD-10-CM | POA: Insufficient documentation

## 2011-07-23 DIAGNOSIS — I10 Essential (primary) hypertension: Secondary | ICD-10-CM | POA: Insufficient documentation

## 2011-07-23 DIAGNOSIS — R51 Headache: Secondary | ICD-10-CM | POA: Insufficient documentation

## 2011-07-23 DIAGNOSIS — R42 Dizziness and giddiness: Secondary | ICD-10-CM | POA: Insufficient documentation

## 2011-07-23 HISTORY — DX: Acute lymphoblastic leukemia not having achieved remission: C91.00

## 2011-07-23 LAB — BASIC METABOLIC PANEL
BUN: 10 mg/dL (ref 6–23)
GFR calc Af Amer: 58 mL/min — ABNORMAL LOW (ref >90)
GFR calc non Af Amer: 50 mL/min — ABNORMAL LOW (ref >90)
Potassium: 3.7 mEq/L (ref 3.5–5.1)
Sodium: 140 mEq/L (ref 135–145)

## 2011-07-23 LAB — CBC
MCH: 28.8 pg (ref 26.0–34.0)
MCHC: 32.8 g/dL (ref 30.0–36.0)
Platelets: 262 10*3/uL (ref 150–400)
RDW: 15.4 % (ref 11.5–15.5)

## 2011-07-23 LAB — DIFFERENTIAL
Basophils Relative: 2 % — ABNORMAL HIGH (ref 0–1)
Eosinophils Absolute: 0.1 10*3/uL (ref 0.0–0.7)
Neutrophils Relative %: 62 % (ref 43–77)

## 2011-07-23 MED ORDER — METOCLOPRAMIDE HCL 5 MG/ML IJ SOLN
10.0000 mg | Freq: Once | INTRAMUSCULAR | Status: AC
  Administered 2011-07-23: 10 mg via INTRAVENOUS
  Filled 2011-07-23: qty 2

## 2011-07-23 MED ORDER — MORPHINE SULFATE 4 MG/ML IJ SOLN
4.0000 mg | Freq: Once | INTRAMUSCULAR | Status: AC
  Administered 2011-07-23: 4 mg via INTRAVENOUS
  Filled 2011-07-23: qty 1

## 2011-07-23 MED ORDER — ONDANSETRON HCL 4 MG/2ML IJ SOLN
4.0000 mg | Freq: Once | INTRAMUSCULAR | Status: AC
  Administered 2011-07-23: 4 mg via INTRAVENOUS
  Filled 2011-07-23: qty 2

## 2011-07-23 MED ORDER — DIPHENHYDRAMINE HCL 50 MG/ML IJ SOLN
25.0000 mg | Freq: Once | INTRAMUSCULAR | Status: AC
  Administered 2011-07-23: 25 mg via INTRAVENOUS
  Filled 2011-07-23: qty 1

## 2011-07-23 NOTE — ED Provider Notes (Signed)
History     CSN: [id number]  Arrival date & time 07/23/11  2000   First MD Initiated Contact with Patient 07/23/11 2040      Chief Complaint  Patient presents with  . Headache     Patient is a 46 y.o. female presenting with headaches. The history is provided by the patient.  Headache  This is a new problem. The current episode started 6 to 12 hours ago. The problem occurs constantly. The problem has been gradually worsening. The headache is associated with nothing. The quality of the pain is described as dull. The pain is severe. The pain does not radiate. Associated symptoms include nausea. Pertinent negatives include no fever, no syncope and no shortness of breath.  pt presents for headache She has h/o leukemia, and earlier this month while at [facility] she developed  Subdural hematoma that required craniotomy (performed by dr Samson Frederic) around June 4th She did well posoperatively, but reports HA returned today Denies fever/vomiting/cp/shortness of breath No focal weakness No abdominal pain No falls reported She reports dizziness She is still currently taking oral chemotherapy drugs   Past Medical History  Diagnosis Date  . Hypertension   . ALL (acute lymphoblastic leukemia)     Past Surgical History  Procedure Date  . Cesarean section   . Foot surgery   . Foot surgery   . Cesarean section   . Eye surgery   . Craniotomy     Family History  Problem Relation Age of Onset  . Cancer Father   . Cancer Mother     colon    History  Substance Use Topics  . Smoking status: Never Smoker   . Smokeless tobacco: Not on file  . Alcohol Use: No    OB History    Grav Para Term Preterm Abortions TAB SAB Ect Mult Living                  Review of Systems  Constitutional: Negative for fever.  Respiratory: Negative for shortness of breath.   Cardiovascular: Negative for syncope.  Gastrointestinal: Positive for nausea.  Neurological: Positive for dizziness and  headaches.  All other systems reviewed and are negative.    Allergies  Review of patient's allergies indicates no known allergies.  Home Medications   Current Outpatient Rx  Name Route Sig Dispense Refill  . BUTALBITAL-APAP-CAFFEINE 50-325-40 MG PO TABS Oral Take 1-2 tablets by mouth every 4 (four) hours as needed. For migraines    . DASATINIB 50 MG PO TABS Oral Take 100 mg by mouth daily.    Marland Kitchen ONDANSETRON HCL 4 MG PO TABS Oral Take 4 mg by mouth every 8 (eight) hours as needed. For nausea/vomiting    . SULFAMETHOXAZOLE-TMP DS 800-160 MG PO TABS Oral Take 1 tablet by mouth See admin instructions. 1 tab BID on Mon, Wed and Fri    . SUMATRIPTAN SUCCINATE 50 MG PO TABS Oral Take 50 mg by mouth every 2 (two) hours as needed. For migraines      BP 127/75  Temp 98.1 F (36.7 C) (Oral)  Resp 18  SpO2 99%  LMP 07/04/2011  Physical Exam CONSTITUTIONAL: Well developed/well nourished Staples in place to posterior scalp and no erythema/drainage noted HEAD AND FACE: Normocephalic/atraumatic EYES: EOMI/PERRL ENMT: Mucous membranes moist NECK: supple no meningeal signs SPINE:entire spine nontender CV: S1/S2 noted, no murmurs/rubs/gallops noted LUNGS: Lungs are clear to auscultation bilaterally, no apparent distress ABDOMEN: soft, nontender, no rebound or guarding GU:no cva tenderness  NEURO: Pt is awake/alert, moves all extremitiesx4 No arm or leg drift is noted.  Face is symmetric on my exam EXTREMITIES: pulses normal, full ROM SKIN: warm, color normal PSYCH: no abnormalities of mood noted  ED Course  Procedures   Labs Reviewed  CBC  DIFFERENTIAL  BASIC METABOLIC PANEL   Pt with HA today s/p craniotomy earlier this month Will get CT imaging Pt is NPO Pain control Will follow closely. 11:35 PM Ct head negative Will call her neurosurgeon for guidance  I discussed case with dr Gerlean Ren at [facility] He is covering for her neurosurgeon We discussed her physical exam findings  (no focal neuro deficits, no meningeal signs, afebrile) We discussed negative CT head Also her incision is well healed without infection He feels if she is improved she can be discharged  Pt improved, she would like to go home and has f/u later this week Discussed at length need for followup this week and strict return precautions discussed She is requesting pain meds, and I advised to stop fiorecet Also, she is able to ambulate on her own here and no ataxia   MDM  Nursing notes including past medical history and social history reviewed and considered in documentation Previous records reviewed and considered - records from [facility] reviewed         Joya Gaskins, MD 07/24/11 [phone]

## 2011-07-23 NOTE — ED Notes (Signed)
C/o headache since 1pm today.  Pt reports having craniotomy at [facility] on June 4th.  Reports nausea at night x 3 days.

## 2011-07-24 ENCOUNTER — Encounter: Payer: Self-pay | Admitting: *Deleted

## 2011-07-24 ENCOUNTER — Other Ambulatory Visit (HOSPITAL_BASED_OUTPATIENT_CLINIC_OR_DEPARTMENT_OTHER): Payer: 59 | Admitting: Lab

## 2011-07-24 DIAGNOSIS — D72829 Elevated white blood cell count, unspecified: Secondary | ICD-10-CM

## 2011-07-24 DIAGNOSIS — I2699 Other pulmonary embolism without acute cor pulmonale: Secondary | ICD-10-CM

## 2011-07-24 DIAGNOSIS — C9100 Acute lymphoblastic leukemia not having achieved remission: Secondary | ICD-10-CM

## 2011-07-24 LAB — COMPREHENSIVE METABOLIC PANEL
ALT: 19 U/L (ref 0–35)
AST: 16 U/L (ref 0–37)
Albumin: 4.2 g/dL (ref 3.5–5.2)
Alkaline Phosphatase: 87 U/L (ref 39–117)
Glucose, Bld: 134 mg/dL — ABNORMAL HIGH (ref 70–99)
Potassium: 3.8 mEq/L (ref 3.5–5.3)
Sodium: 140 mEq/L (ref 135–145)
Total Protein: 6.6 g/dL (ref 6.0–8.3)

## 2011-07-24 LAB — CBC WITH DIFFERENTIAL/PLATELET
EOS%: 2.5 % (ref 0.0–7.0)
Eosinophils Absolute: 0.1 10*3/uL (ref 0.0–0.5)
MCV: 89.4 fL (ref 79.5–101.0)
MONO%: 12.1 % (ref 0.0–14.0)
NEUT#: 2.6 10*3/uL (ref 1.5–6.5)
RBC: 3.62 10*6/uL — ABNORMAL LOW (ref 3.70–5.45)
RDW: 16.2 % — ABNORMAL HIGH (ref 11.2–14.5)
WBC: 4 10*3/uL (ref 3.9–10.3)

## 2011-07-24 MED ORDER — HEPARIN SOD (PORK) LOCK FLUSH 100 UNIT/ML IV SOLN
500.0000 [IU] | Freq: Once | INTRAVENOUS | Status: DC
  Filled 2011-07-24: qty 5

## 2011-07-24 MED ORDER — OXYCODONE-ACETAMINOPHEN 5-325 MG PO TABS: 1.0000 | ORAL_TABLET | ORAL | Status: AC | PRN

## 2011-07-24 NOTE — Progress Notes (Signed)
0900-Pt here for possible transfusion.  Hgb-10.7 and Plt count-266 from 07/24/11 labs.  According to Dr. Mamie Levers office note, pt meets parameters for no transfusion today.  Pt states that she is having no difficulties or complaints at this time.  Pt verbalizes an understanding to return this Thursday for labs with possible transfusion.  Pt instructed to call [facility] with any problems or concerns.

## 2011-07-24 NOTE — ED Notes (Signed)
Pickering MD at bedside. 

## 2011-07-24 NOTE — ED Notes (Signed)
PT states understanding of discharge instructions 

## 2011-07-24 NOTE — Discharge Instructions (Signed)
PLEASE FOLLOWUP WITH YOUR NEUROSURGEON AS PLANNED RETURN FOR WORSENED PAIN, VOMITING, WEAKNESS OR DIFFICULTY WALKING

## 2011-07-26 DIAGNOSIS — I6200 Nontraumatic subdural hemorrhage, unspecified: Secondary | ICD-10-CM | POA: Insufficient documentation

## 2011-07-27 ENCOUNTER — Other Ambulatory Visit: Payer: 59 | Admitting: Lab

## 2011-07-27 ENCOUNTER — Ambulatory Visit: Payer: 59 | Admitting: Nutrition

## 2011-07-27 DIAGNOSIS — I2699 Other pulmonary embolism without acute cor pulmonale: Secondary | ICD-10-CM

## 2011-07-27 DIAGNOSIS — D72829 Elevated white blood cell count, unspecified: Secondary | ICD-10-CM

## 2011-07-27 DIAGNOSIS — C9100 Acute lymphoblastic leukemia not having achieved remission: Secondary | ICD-10-CM

## 2011-07-27 LAB — CBC WITH DIFFERENTIAL/PLATELET
BASO%: 2.1 % — ABNORMAL HIGH (ref 0.0–2.0)
EOS%: 5.6 % (ref 0.0–7.0)
HCT: 31.6 % — ABNORMAL LOW (ref 34.8–46.6)
LYMPH%: 19.2 % (ref 14.0–49.7)
MCH: 29.7 pg (ref 25.1–34.0)
MCHC: 33.3 g/dL (ref 31.5–36.0)
MCV: 89.1 fL (ref 79.5–101.0)
NEUT%: 61.1 % (ref 38.4–76.8)
Platelets: 210 10*3/uL (ref 145–400)

## 2011-07-27 LAB — COMPREHENSIVE METABOLIC PANEL
ALT: 15 U/L (ref 0–35)
AST: 15 U/L (ref 0–37)
BUN: 8 mg/dL (ref 6–23)
Creatinine, Ser: 1.37 mg/dL — ABNORMAL HIGH (ref 0.50–1.10)
Total Bilirubin: 0.4 mg/dL (ref 0.3–1.2)

## 2011-07-27 LAB — MAGNESIUM: Magnesium: 1.9 mg/dL (ref 1.5–2.5)

## 2011-07-27 NOTE — Assessment & Plan Note (Signed)
Lynn [name] is a 46 year old female patient of Dr. Murinson's diagnosed with ALL.  HISTORY:  Includes subdermal hematoma status post craniotomy,hypertension, PE, morbid obesity, arthritis, and migraines.  MEDICATIONS INCLUDE:  Zofran, Imitrex, and Bactrim.  LABORATORIES:  Labs were reviewed.  HEIGHT:  68 inches. WEIGHT:  264 pounds. USUAL BODY WEIGHT:  290 pounds May 18th. BMI:  40.15.  The patient was identified to be at risk on the nutrition risk screening.  She reports poor appetite and fatigue.  She states she is sleeping a lot.  She did not realize how much weight she has lost, but she does verbalize she is eating less when she does eat.  She states she does eat 3 meals a day and is trying to choose healthy foods.  She reports a bit of lactose intolerance when drinking milk, but tolerates cheese and yogurt.  NUTRITION DIAGNOSIS:  Unintended weight loss related to poor appetite and inadequate oral intake as evidenced by a 9% weight loss in 4 weeks.  Patient meets criteria for severe malnutrition in the context of chronic illness secondary to >5% weight loss in one month and < 50% of estimated energy requirements for > 5 days.  INTERVENTION:  I educated the patient on the importance of adequate calories and protein foods in small amounts throughout the day to minimize further weight loss.  I have encouraged her to try some nutritional supplements if she is unable to eat meals and snacks.  I have provided her with a few samples of Ensure and Boost in the flavor of her choice, as well as a juice-based supplements for her to try.  I have provided some fact sheets and my contact information.  Patient verbalizes a good understanding of information and willingness to increase oral intake.  MONITORING/EVALUATION (GOALS):  The patient will tolerate increased oral intake to minimize weight loss and promote preservation of lean body mass.  NEXT VISIT:  Patient prefers to contact me with questions or  concerns.    ______________________________ Zenovia Jarred, RD, LDN Clinical Nutrition Specialist BN/MEDQ  D:  07/27/2011  T:  07/27/2011  Job:  [id number]

## 2011-07-31 ENCOUNTER — Telehealth: Payer: Self-pay

## 2011-07-31 NOTE — Telephone Encounter (Signed)
S/w nurse Annice at [facility] asking when we need to follow up with labs for the pt. She called back and stated we do not need to draw labs this week and they will send orders when they do need labs to be drawn.

## 2011-08-17 ENCOUNTER — Other Ambulatory Visit (HOSPITAL_BASED_OUTPATIENT_CLINIC_OR_DEPARTMENT_OTHER): Payer: 59 | Admitting: Lab

## 2011-08-17 ENCOUNTER — Ambulatory Visit (HOSPITAL_BASED_OUTPATIENT_CLINIC_OR_DEPARTMENT_OTHER): Payer: 59 | Admitting: Oncology

## 2011-08-17 ENCOUNTER — Telehealth: Payer: Self-pay | Admitting: Oncology

## 2011-08-17 ENCOUNTER — Encounter: Payer: Self-pay | Admitting: Oncology

## 2011-08-17 VITALS — BP 113/76 | HR 81 | Temp 97.9°F | Ht 68.0 in | Wt 262.9 lb

## 2011-08-17 DIAGNOSIS — C9100 Acute lymphoblastic leukemia not having achieved remission: Secondary | ICD-10-CM

## 2011-08-17 LAB — COMPREHENSIVE METABOLIC PANEL
Albumin: 4.1 g/dL (ref 3.5–5.2)
Alkaline Phosphatase: 67 U/L (ref 39–117)
BUN: 8 mg/dL (ref 6–23)
CO2: 27 mEq/L (ref 19–32)
Calcium: 8.8 mg/dL (ref 8.4–10.5)
Glucose, Bld: 137 mg/dL — ABNORMAL HIGH (ref 70–99)
Potassium: 4 mEq/L (ref 3.5–5.3)
Sodium: 141 mEq/L (ref 135–145)
Total Protein: 6.1 g/dL (ref 6.0–8.3)

## 2011-08-17 LAB — CBC WITH DIFFERENTIAL/PLATELET
Basophils Absolute: 0 10*3/uL (ref 0.0–0.1)
Eosinophils Absolute: 0.1 10*3/uL (ref 0.0–0.5)
HGB: 10.5 g/dL — ABNORMAL LOW (ref 11.6–15.9)
MCV: 88.5 fL (ref 79.5–101.0)
MONO#: 0.3 10*3/uL (ref 0.1–0.9)
MONO%: 8.4 % (ref 0.0–14.0)
NEUT#: 2.4 10*3/uL (ref 1.5–6.5)
Platelets: 179 10*3/uL (ref 145–400)
RDW: 15.4 % — ABNORMAL HIGH (ref 11.2–14.5)
WBC: 3.6 10*3/uL — ABNORMAL LOW (ref 3.9–10.3)

## 2011-08-17 LAB — MAGNESIUM: Magnesium: 1.7 mg/dL (ref 1.5–2.5)

## 2011-08-17 LAB — LACTATE DEHYDROGENASE: LDH: 216 U/L (ref 94–250)

## 2011-08-17 NOTE — Progress Notes (Signed)
This office note has been dictated.  #[id number]

## 2011-08-17 NOTE — Progress Notes (Signed)
CC:   [name], MD Lynn Gardener, MD  PROBLEM LIST:  1. B-cell acute lymphoblastic leukemia, Philadelphia chromosome  positive, t(9; 22) initially with 71% blasts seen on bone marrow  carried out on Jun 19, 2011. Subsequent bone marrow at [facility] on 06/22/2011 showed 98% blasts  and peripheral blood showed 71% blasts. The patient received  induction treatment with dasatinib (Sprycel) and Decadron. Her  hospital course was complicated by the development of a posterior  fossa subdural hematoma requiring a suboccipital craniotomy and  evacuation of the hematoma on 07/04/2011. The patient also had  neutropenic fever with negative cultures covered with broad-  spectrum antibiotics. She had some headaches, vaginal bleeding and  transaminitis. Admission to [facility] was from 06/21/2011 through 07/17/2011. Bone marrow carried  out on 07/06/2011 showed a hypocellular bone marrow with no  evidence for ALL.  2. Pulmonary emboli involving the right upper and right lower lobes  with positive CT chest angiogram on 06/17/2011 and negative  Dopplers.  3. Development of subdural hematoma involving the posterior fossa when  the patient was hospitalized at [facility], status post suboccipital craniotomy and evacuation  of hematoma on 07/04/2011.  4. History of hypertension.  5. Morbid obesity.  6. Arthritis involving the right hip.  7. History of palpitations.  8. History of migraine headaches.  9. Placement of inferior vena cava Greenfield filter on 07/07/2011. 10. Mild renal insufficiency  11. Right-sided Port-A-Cath placed at [facility] on 07/12/2011.    MEDICATIONS:  1. Tylenol 650 mg every 6 hours as needed.  2. Fioricet 50/325/40 one to two tablets every 4 hours as needed for  headaches.  3. Dasatinib (Sprycel) 100 mg daily for treatment  of ALL. The patient is currently not taking dasatinib.  4. Zofran 4 mg every 8 hours as needed for nausea and vomiting.  5. Bactrim DS 800/160 mg 1 tablet b.i.d. on Mondays, Wednesdays and  Fridays. 6. Imitrex 50 mg as needed for migraine headaches.  HISTORY:  I saw Lynn [name] today for followup of her B-cell acute lymphoblastic leukemia, pulmonary emboli and subdural hematoma.  Lynn [name] was last seen by [place] on 07/20/2011.  She is accompanied today by her sister Zella Ball.  Lynn [name] tells me that she was hospitalized electively at [facility] from 08/07/2011 through 08/11/2011.  During that admission, she received intravenous chemotherapy as well as intrathecal methotrexate which was given on July 9th followed by 2-3 days of IV and subsequently oral leucovorin.  Lynn [name] tells me that she will be readmitted electively to [facility] this Monday from 08/21/2011 through 08/25/2011 and then again on 09/04/2011 again to receive combination of intravenous and intrathecal chemotherapy.  In the meantime, she is a candidate for an allogeneic transplant.  Lynn [name] has 7 sisters, no brothers.  The family is being screened to see if there is a suitable donor for an allogeneic bone marrow transplant.  Lynn [name] had headache develop as of yesterday.  It is dull, located in the left posterior skull.  She has a decreased appetite, had some nausea following her chemo, but no vomiting, trouble with her bowels or mucositis.  She has had no bleeding or bruising problems, fever or infections.  She tells me that she is no longer on dasatinib.  She has also noted some palpitations from time to time.  Denies any difficulty breathing.  PHYSICAL EXAM:  Lynn [name] shows  no major changes.  Weight is 262.9 pounds, height 5 feet 8 inches, body surface area 2.39 m2.  Blood pressure 113/76.  Pulse is regular.  Temperature 97.9.  O2 saturation on room air at rest was 99-100%, and respiratory rate was 20.  There is no scleral icterus.  Mouth and  pharynx benign.  No purpura.  The scalp incisions have healed well.  No peripheral adenopathy palpable.  Heart/Lungs: Normal.  There is a right-sided Port-A-Cath.  Abdomen:  Obese, nontender with no organomegaly or masses palpable.  Bruises have resolved. Extremities:  No peripheral edema.  Neurologic:  Normal.  LABORATORY DATA:  Today, white count 3.6, ANC 2.4, hemoglobin 10.5, hematocrit 31.8, platelets 179,000.  Chemistries and magnesium today are pending.  Chemistries from 07/20/2011 notable for a BUN of 8, creatinine 1.54.  Albumin was 3.9.  LDH from today is pending.  On 06/19/2011 the LDH had been 492.  IMAGING STUDIES: 1. CT angiogram of the chest on 06/17/2011 showed right-sided     pulmonary emboli.  There were mildly prominent mediastinal and     right hilar lymph nodes. 2. CT-guided iliac bone aspiration and core biopsy were carried out on     06/19/2011. 3. CT of the head without IV contrast on 07/23/2011 showed that the     patient was status post suboccipital craniotomy and right frontal     bur hole placement.  Otherwise negative noncontrast CT appearance     of the brain.  IMPRESSION/PLAN:  Lynn [name] seems to be doing well at the present time. She is on a protocol CALGB 95621.  She is no longer on dasatinib.  She is receiving intravenous chemotherapy and intrathecal methotrexate for CNS prophylaxis.  Her sisters are being screen for compatible donor for an allogeneic bone marrow transplantation at some point.  At this point, Lynn [name] seems to be tolerating her treatments well.  At some point she will apparently be restarting her dasatinib.  She is scheduled to be admitted back to [facility] on Monday, 07/22 through 07/26 and again the week of August 5th.  Lynn [name] tells me that she will be going on disability and being released from her position with the [employer].  She will need to find some insurance so that her children will be covered.  We are in  the process of obtaining the records from [facility] for Lynn [name]'s admission there from 07/08 through 07/12.  We will be happy to assist with any lab work or transfusions that Lynn [name] may need.  Either Dr. Rennis Harding or she will let us know.  We will plan to see Lynn [name] again in about 2 months which will be about September 20th at which time will check CBC and chemistries.    ______________________________ Samul Dada, [name] DSM/MEDQ  D:  08/17/2011  T:  08/17/2011  Job:  [id number]

## 2011-08-17 NOTE — Telephone Encounter (Signed)
l/m with 9/20 appt info    aom

## 2011-08-21 DIAGNOSIS — C9100 Acute lymphoblastic leukemia not having achieved remission: Secondary | ICD-10-CM | POA: Insufficient documentation

## 2011-08-21 DIAGNOSIS — Z5111 Encounter for antineoplastic chemotherapy: Secondary | ICD-10-CM | POA: Insufficient documentation

## 2011-08-21 DIAGNOSIS — C9101 Acute lymphoblastic leukemia, in remission: Secondary | ICD-10-CM | POA: Insufficient documentation

## 2011-08-23 ENCOUNTER — Other Ambulatory Visit: Payer: Self-pay | Admitting: Medical Oncology

## 2011-08-23 DIAGNOSIS — C9100 Acute lymphoblastic leukemia not having achieved remission: Secondary | ICD-10-CM

## 2011-08-25 DIAGNOSIS — A879 Viral meningitis, unspecified: Secondary | ICD-10-CM | POA: Insufficient documentation

## 2011-08-28 ENCOUNTER — Ambulatory Visit (HOSPITAL_BASED_OUTPATIENT_CLINIC_OR_DEPARTMENT_OTHER): Payer: 59 | Admitting: Lab

## 2011-08-28 ENCOUNTER — Other Ambulatory Visit: Payer: Self-pay | Admitting: Oncology

## 2011-08-28 ENCOUNTER — Encounter: Payer: Self-pay | Admitting: Medical Oncology

## 2011-08-28 DIAGNOSIS — C9100 Acute lymphoblastic leukemia not having achieved remission: Secondary | ICD-10-CM

## 2011-08-28 LAB — COMPREHENSIVE METABOLIC PANEL
ALT: 29 U/L (ref 0–35)
AST: 20 U/L (ref 0–37)
CO2: 29 mEq/L (ref 19–32)
Calcium: 9.3 mg/dL (ref 8.4–10.5)
Chloride: 103 mEq/L (ref 96–112)
Creatinine, Ser: 1.01 mg/dL (ref 0.50–1.10)
Sodium: 142 mEq/L (ref 135–145)
Total Protein: 6.6 g/dL (ref 6.0–8.3)

## 2011-08-28 LAB — CBC WITH DIFFERENTIAL/PLATELET
BASO%: 1.7 % (ref 0.0–2.0)
EOS%: 6.9 % (ref 0.0–7.0)
MCH: 29.8 pg (ref 25.1–34.0)
MCHC: 33.7 g/dL (ref 31.5–36.0)
MONO#: 0.3 10*3/uL (ref 0.1–0.9)
RBC: 3.69 10*6/uL — ABNORMAL LOW (ref 3.70–5.45)
RDW: 14.9 % — ABNORMAL HIGH (ref 11.2–14.5)
WBC: 2.9 10*3/uL — ABNORMAL LOW (ref 3.9–10.3)
lymph#: 1.1 10*3/uL (ref 0.9–3.3)

## 2011-08-28 LAB — MAGNESIUM: Magnesium: 2.1 mg/dL (ref 1.5–2.5)

## 2011-08-28 NOTE — Progress Notes (Signed)
Labs faxed to Dr. Rennis Harding [phone]

## 2011-09-05 ENCOUNTER — Other Ambulatory Visit: Payer: Self-pay

## 2011-09-07 ENCOUNTER — Telehealth: Payer: Self-pay | Admitting: Oncology

## 2011-09-07 NOTE — Telephone Encounter (Signed)
Per 8./8 pof an md visit needed to be added for the end of sept,placed note for pt to p/u sch on 8/12 at lab visit

## 2011-09-07 NOTE — Telephone Encounter (Signed)
Lynn [name] had l/m for labs for 8/12 and 8/15,done,and called Lynn [name] back    aom

## 2011-09-11 ENCOUNTER — Other Ambulatory Visit (HOSPITAL_BASED_OUTPATIENT_CLINIC_OR_DEPARTMENT_OTHER): Payer: 59 | Admitting: Lab

## 2011-09-11 DIAGNOSIS — D72829 Elevated white blood cell count, unspecified: Secondary | ICD-10-CM

## 2011-09-11 DIAGNOSIS — C9100 Acute lymphoblastic leukemia not having achieved remission: Secondary | ICD-10-CM

## 2011-09-11 DIAGNOSIS — I2699 Other pulmonary embolism without acute cor pulmonale: Secondary | ICD-10-CM

## 2011-09-11 LAB — CBC WITH DIFFERENTIAL/PLATELET
BASO%: 0.7 % (ref 0.0–2.0)
Basophils Absolute: 0 10*3/uL (ref 0.0–0.1)
EOS%: 4.7 % (ref 0.0–7.0)
HCT: 32 % — ABNORMAL LOW (ref 34.8–46.6)
HGB: 10.6 g/dL — ABNORMAL LOW (ref 11.6–15.9)
MCH: 28.8 pg (ref 25.1–34.0)
MCHC: 33.1 g/dL (ref 31.5–36.0)
MCV: 86.9 fL (ref 79.5–101.0)
MONO%: 7.4 % (ref 0.0–14.0)
NEUT%: 61.5 % (ref 38.4–76.8)
RDW: 14.7 % — ABNORMAL HIGH (ref 11.2–14.5)
lymph#: 1.1 10*3/uL (ref 0.9–3.3)

## 2011-09-11 LAB — COMPREHENSIVE METABOLIC PANEL
ALT: 17 U/L (ref 0–35)
AST: 16 U/L (ref 0–37)
Alkaline Phosphatase: 70 U/L (ref 39–117)
BUN: 14 mg/dL (ref 6–23)
Creatinine, Ser: 1.11 mg/dL — ABNORMAL HIGH (ref 0.50–1.10)

## 2011-09-14 ENCOUNTER — Other Ambulatory Visit (HOSPITAL_BASED_OUTPATIENT_CLINIC_OR_DEPARTMENT_OTHER): Payer: 59 | Admitting: Lab

## 2011-09-14 DIAGNOSIS — I2699 Other pulmonary embolism without acute cor pulmonale: Secondary | ICD-10-CM

## 2011-09-14 DIAGNOSIS — C9110 Chronic lymphocytic leukemia of B-cell type not having achieved remission: Secondary | ICD-10-CM

## 2011-09-14 DIAGNOSIS — C9100 Acute lymphoblastic leukemia not having achieved remission: Secondary | ICD-10-CM

## 2011-09-14 DIAGNOSIS — D72829 Elevated white blood cell count, unspecified: Secondary | ICD-10-CM

## 2011-09-14 LAB — COMPREHENSIVE METABOLIC PANEL
Albumin: 4 g/dL (ref 3.5–5.2)
Alkaline Phosphatase: 74 U/L (ref 39–117)
BUN: 6 mg/dL (ref 6–23)
Creatinine, Ser: 1.18 mg/dL — ABNORMAL HIGH (ref 0.50–1.10)
Glucose, Bld: 122 mg/dL — ABNORMAL HIGH (ref 70–99)
Potassium: 3.7 mEq/L (ref 3.5–5.3)

## 2011-09-14 LAB — CBC WITH DIFFERENTIAL/PLATELET
Basophils Absolute: 0 10*3/uL (ref 0.0–0.1)
Eosinophils Absolute: 0.2 10*3/uL (ref 0.0–0.5)
HCT: 31.3 % — ABNORMAL LOW (ref 34.8–46.6)
HGB: 10.5 g/dL — ABNORMAL LOW (ref 11.6–15.9)
LYMPH%: 31.1 % (ref 14.0–49.7)
MCH: 29.1 pg (ref 25.1–34.0)
MCV: 86.7 fL (ref 79.5–101.0)
MONO%: 13.3 % (ref 0.0–14.0)
NEUT#: 2.8 10*3/uL (ref 1.5–6.5)
NEUT%: 50.5 % (ref 38.4–76.8)
Platelets: 185 10*3/uL (ref 145–400)

## 2011-10-03 ENCOUNTER — Telehealth: Payer: Self-pay | Admitting: *Deleted

## 2011-10-03 ENCOUNTER — Other Ambulatory Visit: Payer: Self-pay | Admitting: Medical Oncology

## 2011-10-03 ENCOUNTER — Telehealth: Payer: Self-pay | Admitting: Oncology

## 2011-10-03 DIAGNOSIS — C9100 Acute lymphoblastic leukemia not having achieved remission: Secondary | ICD-10-CM

## 2011-10-03 NOTE — Telephone Encounter (Signed)
Called pt and number will not take calls, called sister, number also is no good, called work number in [employer] and pt no longer work there

## 2011-10-03 NOTE — Telephone Encounter (Signed)
RECEIVED FAXED ORDERS FROM [facility]. THESE ORDERS WERE GIVEN TO DR.MURINSON'S NURSE, ROBIN BASS,RN.

## 2011-10-04 ENCOUNTER — Other Ambulatory Visit: Payer: Self-pay | Admitting: Medical Oncology

## 2011-10-04 ENCOUNTER — Telehealth: Payer: Self-pay | Admitting: Oncology

## 2011-10-04 ENCOUNTER — Telehealth: Payer: Self-pay | Admitting: Medical Oncology

## 2011-10-04 ENCOUNTER — Ambulatory Visit (HOSPITAL_BASED_OUTPATIENT_CLINIC_OR_DEPARTMENT_OTHER): Payer: 59

## 2011-10-04 VITALS — BP 163/88 | HR 83 | Temp 98.0°F | Resp 17

## 2011-10-04 DIAGNOSIS — C9590 Leukemia, unspecified not having achieved remission: Secondary | ICD-10-CM

## 2011-10-04 DIAGNOSIS — C9100 Acute lymphoblastic leukemia not having achieved remission: Secondary | ICD-10-CM

## 2011-10-04 DIAGNOSIS — Z452 Encounter for adjustment and management of vascular access device: Secondary | ICD-10-CM

## 2011-10-04 MED ORDER — SODIUM CHLORIDE 0.9 % IJ SOLN
10.0000 mL | INTRAMUSCULAR | Status: DC | PRN
  Administered 2011-10-04: 10 mL via INTRAVENOUS
  Filled 2011-10-04: qty 10

## 2011-10-04 MED ORDER — HEPARIN SOD (PORK) LOCK FLUSH 100 UNIT/ML IV SOLN
500.0000 [IU] | Freq: Once | INTRAVENOUS | Status: AC
  Administered 2011-10-04: 500 [IU] via INTRAVENOUS
  Filled 2011-10-04: qty 5

## 2011-10-04 NOTE — Telephone Encounter (Signed)
Pt here to get her hickman flushed and asked her to update her phone number

## 2011-10-04 NOTE — Telephone Encounter (Signed)
pt aware of md added to 9/11 appt   aom

## 2011-10-05 ENCOUNTER — Other Ambulatory Visit: Payer: Self-pay | Admitting: Medical Oncology

## 2011-10-05 ENCOUNTER — Other Ambulatory Visit (HOSPITAL_BASED_OUTPATIENT_CLINIC_OR_DEPARTMENT_OTHER): Payer: 59 | Admitting: Lab

## 2011-10-05 ENCOUNTER — Encounter (HOSPITAL_COMMUNITY)
Admission: RE | Admit: 2011-10-05 | Discharge: 2011-10-05 | Disposition: A | Discharge status: Home / Self Care | Payer: 59 | Source: Ambulatory Visit | Attending: Oncology | Admitting: Oncology

## 2011-10-05 ENCOUNTER — Encounter: Payer: Self-pay | Admitting: Medical Oncology

## 2011-10-05 ENCOUNTER — Telehealth: Payer: Self-pay | Admitting: Medical Oncology

## 2011-10-05 ENCOUNTER — Ambulatory Visit (HOSPITAL_BASED_OUTPATIENT_CLINIC_OR_DEPARTMENT_OTHER): Payer: 59

## 2011-10-05 VITALS — BP 132/88 | HR 84 | Temp 98.4°F

## 2011-10-05 DIAGNOSIS — D72829 Elevated white blood cell count, unspecified: Secondary | ICD-10-CM

## 2011-10-05 DIAGNOSIS — C9100 Acute lymphoblastic leukemia not having achieved remission: Secondary | ICD-10-CM | POA: Insufficient documentation

## 2011-10-05 DIAGNOSIS — I2699 Other pulmonary embolism without acute cor pulmonale: Secondary | ICD-10-CM

## 2011-10-05 DIAGNOSIS — C9590 Leukemia, unspecified not having achieved remission: Secondary | ICD-10-CM

## 2011-10-05 LAB — COMPREHENSIVE METABOLIC PANEL (CC13)
ALT: 14 U/L (ref 0–55)
AST: 15 U/L (ref 5–34)
Albumin: 3.7 g/dL (ref 3.5–5.0)
CO2: 23 mEq/L (ref 22–29)
Calcium: 8.7 mg/dL (ref 8.4–10.4)
Chloride: 106 mEq/L (ref 98–107)
Potassium: 3.4 mEq/L — ABNORMAL LOW (ref 3.5–5.1)
Total Protein: 6.2 g/dL — ABNORMAL LOW (ref 6.4–8.3)

## 2011-10-05 LAB — MAGNESIUM (CC13): Magnesium: 1.9 mg/dl (ref 1.5–2.5)

## 2011-10-05 LAB — CBC WITH DIFFERENTIAL/PLATELET
HCT: 24.8 % — ABNORMAL LOW (ref 34.8–46.6)
HGB: 8.6 g/dL — ABNORMAL LOW (ref 11.6–15.9)
RDW: 13.4 % (ref 11.2–14.5)
WBC: <0.2 10*3/uL — CL (ref 3.9–10.3)

## 2011-10-05 LAB — TECHNOLOGIST REVIEW

## 2011-10-05 LAB — ABO/RH: ABO/RH(D): O POS

## 2011-10-05 MED ORDER — SODIUM CHLORIDE 0.9 % IJ SOLN
10.0000 mL | INTRAMUSCULAR | Status: DC | PRN
  Administered 2011-10-05: 10 mL via INTRAVENOUS
  Filled 2011-10-05: qty 10

## 2011-10-05 MED ORDER — HEPARIN SOD (PORK) LOCK FLUSH 100 UNIT/ML IV SOLN
500.0000 [IU] | Freq: Once | INTRAVENOUS | Status: AC
  Administered 2011-10-05: 500 [IU] via INTRAVENOUS
  Filled 2011-10-05: qty 5

## 2011-10-05 NOTE — Telephone Encounter (Signed)
I called pt and left her a message regarding her labs. Her HGB is 8.6 and platelets 19. Per [facility] orders to transfuse HGB 8.5 and Platelets less than 20. Per Dr. Truett Perna- on call MD draw labs on Friday  10/06/11 and transfuse if needed. I asked her to call me to verify the appointments. Labs faxed to [facility]

## 2011-10-05 NOTE — Patient Instructions (Signed)
Call MD for problems 

## 2011-10-05 NOTE — Progress Notes (Signed)
I called blood bank and spoke with Rosey Bath to let her know that all her blood products need to be irradiated.

## 2011-10-06 ENCOUNTER — Other Ambulatory Visit: Payer: Self-pay | Admitting: Medical Oncology

## 2011-10-06 ENCOUNTER — Other Ambulatory Visit: Payer: Self-pay | Admitting: Family

## 2011-10-06 ENCOUNTER — Other Ambulatory Visit (HOSPITAL_BASED_OUTPATIENT_CLINIC_OR_DEPARTMENT_OTHER): Payer: 59 | Admitting: Lab

## 2011-10-06 ENCOUNTER — Ambulatory Visit (HOSPITAL_BASED_OUTPATIENT_CLINIC_OR_DEPARTMENT_OTHER): Payer: 59

## 2011-10-06 ENCOUNTER — Encounter: Payer: Self-pay | Admitting: Family

## 2011-10-06 VITALS — BP 128/83 | HR 69 | Temp 98.7°F | Resp 18

## 2011-10-06 VITALS — BP 140/89 | HR 80 | Temp 98.1°F

## 2011-10-06 DIAGNOSIS — C9100 Acute lymphoblastic leukemia not having achieved remission: Secondary | ICD-10-CM

## 2011-10-06 LAB — CBC WITH DIFFERENTIAL/PLATELET
HCT: 25.9 % — ABNORMAL LOW (ref 34.8–46.6)
HGB: 9 g/dL — ABNORMAL LOW (ref 11.6–15.9)
MCH: 27.4 pg (ref 25.1–34.0)
MCHC: 34.7 g/dL (ref 31.5–36.0)
MCV: 78.7 fL — ABNORMAL LOW (ref 79.5–101.0)
Platelets: 9 10*3/uL — CL (ref 145–400)

## 2011-10-06 MED ORDER — SODIUM CHLORIDE 0.9 % IJ SOLN
10.0000 mL | INTRAMUSCULAR | Status: DC | PRN
  Filled 2011-10-06: qty 10

## 2011-10-06 MED ORDER — DIPHENHYDRAMINE HCL 25 MG PO CAPS
25.0000 mg | ORAL_CAPSULE | Freq: Once | ORAL | Status: AC
  Administered 2011-10-06: 25 mg via ORAL

## 2011-10-06 MED ORDER — PROCHLORPERAZINE MALEATE 10 MG PO TABS: 10.0000 mg | ORAL_TABLET | Freq: Four times a day (QID) | ORAL | Status: DC | PRN

## 2011-10-06 MED ORDER — METHYLPREDNISOLONE SODIUM SUCC 40 MG IJ SOLR
60.0000 mg | Freq: Once | INTRAMUSCULAR | Status: AC
Start: 2011-10-06 — End: 2011-10-06
  Administered 2011-10-06: 60 mg via INTRAVENOUS

## 2011-10-06 MED ORDER — HEPARIN SOD (PORK) LOCK FLUSH 100 UNIT/ML IV SOLN
250.0000 [IU] | INTRAVENOUS | Status: DC | PRN
  Filled 2011-10-06: qty 5

## 2011-10-06 MED ORDER — HEPARIN SOD (PORK) LOCK FLUSH 100 UNIT/ML IV SOLN
500.0000 [IU] | INTRAVENOUS | Status: AC | PRN
  Administered 2011-10-06: 500 [IU]
  Filled 2011-10-06: qty 5

## 2011-10-06 MED ORDER — SODIUM CHLORIDE 0.9 % IV SOLN
250.0000 mL | Freq: Once | INTRAVENOUS | Status: AC
  Administered 2011-10-06: 250 mL via INTRAVENOUS

## 2011-10-06 MED ORDER — DIPHENHYDRAMINE HCL 50 MG/ML IJ SOLN
25.0000 mg | Freq: Once | INTRAMUSCULAR | Status: AC
  Administered 2011-10-06: 25 mg via INTRAVENOUS

## 2011-10-06 MED ORDER — METHYLPREDNISOLONE SODIUM SUCC 40 MG IJ SOLR
20.0000 mg | Freq: Once | INTRAMUSCULAR | Status: AC
Start: 2011-10-06 — End: 2011-10-06
  Administered 2011-10-06: 20 mg via INTRAVENOUS

## 2011-10-06 MED ORDER — SODIUM CHLORIDE 0.9 % IJ SOLN
10.0000 mL | INTRAMUSCULAR | Status: AC | PRN
  Administered 2011-10-06: 10 mL
  Filled 2011-10-06: qty 10

## 2011-10-06 MED ORDER — ACETAMINOPHEN 325 MG PO TABS
650.0000 mg | ORAL_TABLET | Freq: Once | ORAL | Status: AC
  Administered 2011-10-06: 650 mg via ORAL

## 2011-10-06 MED ORDER — SODIUM CHLORIDE 0.9 % IV SOLN
Freq: Once | INTRAVENOUS | Status: AC
  Administered 2011-10-06: 13:00:00 via INTRAVENOUS

## 2011-10-06 MED ORDER — PROCHLORPERAZINE MALEATE 10 MG PO TABS
10.0000 mg | ORAL_TABLET | Freq: Once | ORAL | Status: AC
  Administered 2011-10-06: 10 mg via ORAL

## 2011-10-06 NOTE — Progress Notes (Incomplete)
Contacted by Dr. Dalene Carrow and  nursing staff at 1245 that the patient was having a transfusion reaction while receiving a unit of platelets.  The patient was assessed.  HR is stable, no SOB, no fever, no abdominal pain, no diaphoresis.   Lynn [name] has urticaria on her face, abdominal area and upper back/cervical area.  No angioedema, tongue swelling, difficulty breathing or difficulty swallowing.  According to the patient's nurse the patient has received Benadryl 25 mg PO as a pre-medication, Tylenol 650 mg PO as a pre-medication, 500 ml of NS, Compazine 10 mg PO and Solumedrol 20 mg IV.  The patient will receive another 500 ml of NS, Solumedrol 60 mg IV and Benadryl 25 mg IV now.  After these medications are given, the patient will be re-challenged with the platelet infusion.

## 2011-10-06 NOTE — Progress Notes (Signed)
1126 15 minutes into the start of platelets patient started to c/o nausea. Order for compazine received and 10mg  PO of compazine given (see MAR).  1205 Patient c/o itching to face and back. VSS. Platlets stopped. NS line started to freeflow. MD notified and order for solumedrol received and administered @ 1210.  1300 Patient continues to itch and facial redness present. MD notified and at bedside. New orders received and administered (see MAR).  1335 VSS. Platelets restarted. Continuing to monitor patient closely.  1430 VSS. Platelet infusion complete with no further complaints.

## 2011-10-06 NOTE — Patient Instructions (Signed)
Platelet Transfusion Information This is information about transfusions of platelets. Platelets are tiny cells made by the bone marrow and found in the blood. When a blood vessel is damaged platelets rush to the damaged area to help form a clot. This begins the healing process. When platelets get very low your blood may have trouble clotting. This may be from:  Illness.   Blood disorder.   Chemotherapy to treat cancer.  Often lower platelet counts do not usually cause problems.  Platelets usually last for 7 to 10 days. If they are not used not used in an injury, they are broken down by the liver or spleen. Symptoms of low platelet count include:  Nosebleeds.   Bleeding gums.   Heavy periods.   Bruising and tiny blood spots in the skin.   Pin point spots of bleeding are called (petechiae).   Larger bruises (purpura).   Bleeding can be more serious if it happens in the brain or bowel.  Platelet transfusions are often used to keep the platelet count at an acceptable level. Serious bleeding due to low platelets is uncommon. RISKS AND COMPLICATIONS Severe side effects from platelet transfusions are uncommon. Minor reactions may include:  Itching.   Rashes.   High temperature and shivering.  Medications are available to stop transfusion reactions. Let your caregivers know if you develop any of the above problems.  If you are having platelet transfusions frequently they may get less effective. This is called becoming refractory to platelets. It is uncommon. This can happen from non-immune causes and immune causes. Non-immune causes include:  High temperatures.   Some medications.   An enlarged spleen.  Immune causes happen when your body discovers the platelets are not your own and begin making antibodies against them. The antibodies kill the platelets quickly. Even with platelet transfusions you may still notice problems with bleeding or bruising. Let your caregivers know about  this. Other things can be done to help if this happens.  BEFORE THE PROCEDURE   Your doctors will check your platelet count regularly.   If the platelet count is too low it may be necessary to have a platelet transfusion.   This is more important before certain procedures with a risk of bleeding such as a spinal tap.   Platelet transfusion reduces the risk of bleeding during or after the procedure.   Except in emergencies, giving a transfusion requires a written consent.  Before blood is taken from a donor, a complete history is taken to make sure the person has no history of previous diseases, nor engages in risky social behavior. Examples of this are intravenous drug use or sexual activity with multiple partners. This could lead to infected blood or blood products being used. This history is done even in spite of the extensive testing to make sure the blood is safe. All blood products transfused are tested to make sure it is a match for the person getting the blood. It is also checked for infections. Blood is the safest it has ever been. The risk of getting an infection is very low. PROCEDURE  The platelets are stored in small plastic bags which are kept at a low temperature.   Each bag is called a unit and sometimes two units are given. They are given through an intravenous line by drip infusion over about one half hour.   Usually blood is collected from multiple people to get enough to transfuse.   Sometimes, the platelets are collected from a single   person. This is done using a special machine that separates the platelets from the blood. The machine is called an apheresis machine. Platelets collected in this way are called apheresed platelets. Apheresed platelets reduce the risk of becoming sensitive to the platelets. This lowers the chances of having a transfusion reaction.   As it only takes a short time to give the platelets, this treatment can be given in an outpatients department.  Platelets can also be given before or after other treatments.  SEEK IMMEDIATE MEDICAL CARE IF: Any of the following symptoms over the next 12 hours or several days:  Shaking chills.   Fever with a temperature greater than 102 F (38.9 C) develops.   Back pain or muscle pain.   People around you feel you are not acting correctly, or you are confused.   Blood in the urine or bowel movements or bleeding from any place in your body.   Shortness of breath, or difficulty breathing.   Dizziness.   Fainting.   You break out in a rash or develop hives.   You have a decrease in the amount of urine you are putting out, or the urine turns a dark color or changes to pink, red, or brown.   A severe headache or stiff neck.   Bruising more easily.  Document Released: 11/13/2006 Document Revised: 01/05/2011 Document Reviewed: 11/13/2006 Exit[employer] Patient Information 2012 [employer]. 

## 2011-10-08 ENCOUNTER — Emergency Department (HOSPITAL_COMMUNITY)
Admission: EM | Admit: 2011-10-08 | Discharge: 2011-10-08 | Payer: 59 | Attending: Emergency Medicine | Admitting: Emergency Medicine

## 2011-10-08 ENCOUNTER — Emergency Department (HOSPITAL_COMMUNITY): Payer: 59

## 2011-10-08 ENCOUNTER — Encounter (HOSPITAL_COMMUNITY): Payer: Self-pay | Admitting: Emergency Medicine

## 2011-10-08 DIAGNOSIS — Z79899 Other long term (current) drug therapy: Secondary | ICD-10-CM | POA: Insufficient documentation

## 2011-10-08 DIAGNOSIS — R5081 Fever presenting with conditions classified elsewhere: Secondary | ICD-10-CM | POA: Insufficient documentation

## 2011-10-08 DIAGNOSIS — D709 Neutropenia, unspecified: Secondary | ICD-10-CM | POA: Insufficient documentation

## 2011-10-08 DIAGNOSIS — C9100 Acute lymphoblastic leukemia not having achieved remission: Secondary | ICD-10-CM | POA: Insufficient documentation

## 2011-10-08 DIAGNOSIS — I10 Essential (primary) hypertension: Secondary | ICD-10-CM | POA: Insufficient documentation

## 2011-10-08 DIAGNOSIS — D61818 Other pancytopenia: Secondary | ICD-10-CM | POA: Insufficient documentation

## 2011-10-08 DIAGNOSIS — Z9481 Bone marrow transplant status: Secondary | ICD-10-CM | POA: Insufficient documentation

## 2011-10-08 HISTORY — DX: Leukemia, unspecified not having achieved remission: C95.90

## 2011-10-08 LAB — LACTIC ACID, PLASMA: Lactic Acid, Venous: 1.1 mmol/L (ref 0.5–2.2)

## 2011-10-08 LAB — CBC WITH DIFFERENTIAL/PLATELET
Hemoglobin: 8.4 g/dL — ABNORMAL LOW (ref 12.0–15.0)
RBC: 3.05 MIL/uL — ABNORMAL LOW (ref 3.87–5.11)
WBC: <1 10*3/uL — CL (ref 4.0–10.5)

## 2011-10-08 LAB — COMPREHENSIVE METABOLIC PANEL
Albumin: 3.8 g/dL (ref 3.5–5.2)
BUN: 11 mg/dL (ref 6–23)
Calcium: 8.8 mg/dL (ref 8.4–10.5)
Chloride: 101 mEq/L (ref 96–112)
Creatinine, Ser: 0.84 mg/dL (ref 0.50–1.10)
Total Bilirubin: 0.3 mg/dL (ref 0.3–1.2)
Total Protein: 6.8 g/dL (ref 6.0–8.3)

## 2011-10-08 LAB — PROCALCITONIN: Procalcitonin: <0.1 ng/mL

## 2011-10-08 LAB — PREPARE PLATELET PHERESIS

## 2011-10-08 MED ORDER — SODIUM CHLORIDE 0.9 % IV SOLN
1000.0000 mL | Freq: Once | INTRAVENOUS | Status: AC
  Administered 2011-10-08: 1000 mL via INTRAVENOUS

## 2011-10-08 MED ORDER — DIPHENHYDRAMINE HCL 50 MG/ML IJ SOLN
25.0000 mg | Freq: Once | INTRAMUSCULAR | Status: AC
  Administered 2011-10-08: 25 mg via INTRAVENOUS
  Filled 2011-10-08: qty 1

## 2011-10-08 MED ORDER — SODIUM CHLORIDE 0.9 % IV SOLN: 1000.0000 mL | Freq: Once | INTRAVENOUS | Status: DC

## 2011-10-08 MED ORDER — SODIUM CHLORIDE 0.9 % IJ SOLN
10.0000 mL | INTRAMUSCULAR | Status: DC | PRN
  Administered 2011-10-08: 20 mL

## 2011-10-08 MED ORDER — VANCOMYCIN HCL IN DEXTROSE 1-5 GM/200ML-% IV SOLN
1000.0000 mg | Freq: Once | INTRAVENOUS | Status: AC
  Administered 2011-10-08: 1000 mg via INTRAVENOUS
  Filled 2011-10-08: qty 200

## 2011-10-08 MED ORDER — METOCLOPRAMIDE HCL 5 MG/ML IJ SOLN
10.0000 mg | Freq: Once | INTRAMUSCULAR | Status: AC
  Administered 2011-10-08: 10 mg via INTRAVENOUS
  Filled 2011-10-08: qty 2

## 2011-10-08 MED ORDER — HYDROMORPHONE HCL PF 1 MG/ML IJ SOLN
1.0000 mg | Freq: Once | INTRAMUSCULAR | Status: AC
  Administered 2011-10-08: 1 mg via INTRAVENOUS
  Filled 2011-10-08: qty 1

## 2011-10-08 MED ORDER — SODIUM CHLORIDE 0.9 % IV SOLN
1000.0000 mL | INTRAVENOUS | Status: DC
  Administered 2011-10-08: 1000 mL via INTRAVENOUS

## 2011-10-08 MED ORDER — SODIUM CHLORIDE 0.9 % IJ SOLN: 10.0000 mL | Freq: Two times a day (BID) | INTRAMUSCULAR | Status: DC

## 2011-10-08 MED ORDER — DEXTROSE 5 % IV SOLN
2.0000 g | Freq: Once | INTRAVENOUS | Status: AC
  Administered 2011-10-08: 2 g via INTRAVENOUS
  Filled 2011-10-08: qty 2

## 2011-10-08 MED ORDER — ACETAMINOPHEN 325 MG PO TABS
975.0000 mg | ORAL_TABLET | Freq: Once | ORAL | Status: AC
  Administered 2011-10-08: 975 mg via ORAL
  Filled 2011-10-08: qty 3

## 2011-10-08 NOTE — ED Provider Notes (Signed)
History     CSN: [id number]  Arrival date & time 10/08/11  1041   First MD Initiated Contact with Patient 10/08/11 1117      Chief Complaint  Patient presents with  . Fever    (Consider location/radiation/quality/duration/timing/severity/associated sxs/prior treatment) HPI This 46 year old female is a bone marrow transplant recipient with a history of ALL, just discharged a week ago from her most recent chemotherapy, today she had fever 100.8 skin to the emergency department for blood cultures, antibiotics, and transferred to the bone marrow transplant unit at [facility] today. Patient has had a moderately severe headache today like she has had in the past with gradual onset she also some shortness of breath without cough today. She is no altered mental status and no change in speech vision swallowing or understanding. She is no rashes although she does have an indwelling access port in her left chest area without surrounding redness or postdrainage. She is no abdominal pain or vomiting but she has had a couple loose stools today which are nonbloody. She is no dysuria. She is no lateralizing weakness numbness or incoordination. There is no treatment prior to arrival. She did not have sudden onset headache and she has had headaches like this in the past. Past Medical History  Diagnosis Date  . Hypertension   . ALL (acute lymphoblastic leukemia)   . Leukemia     Past Surgical History  Procedure Date  . Cesarean section   . Foot surgery   . Foot surgery   . Cesarean section   . Eye surgery   . Craniotomy     Family History  Problem Relation Age of Onset  . Cancer Father   . Cancer Mother     colon    History  Substance Use Topics  . Smoking status: Never Smoker   . Smokeless tobacco: Not on file  . Alcohol Use: No    OB History    Grav Para Term Preterm Abortions TAB SAB Ect Mult Living                  Review of Systems 10 Systems reviewed and are negative  for acute change except as noted in the HPI. Allergies  Review of patient's allergies indicates no known allergies.  Home Medications   Current Outpatient Rx  Name Route Sig Dispense Refill  . ACYCLOVIR 400 MG PO TABS Oral Take 400 mg by mouth 2 (two) times daily.    Marland Kitchen DASATINIB 50 MG PO TABS Oral Take 100 mg by mouth daily. Last dose 6/30 - may or may not resume    . FILGRASTIM 480 MCG/0.8ML IJ SOLN Subcutaneous Inject 1,080 mcg into the skin once. Suppose to start on 9/10    . HYPROMELLOSE 2.5 % OP SOLN Both Eyes Place 1 drop into both eyes 3 (three) times daily as needed. Dry eyes.    Marland Kitchen LEVOFLOXACIN 750 MG PO TABS Oral Take 750 mg by mouth daily.    Marland Kitchen LIDOCAINE-PRILOCAINE 2.5-2.5 % EX CREA Topical Apply 1 application topically as needed. To port prior to access    . OXYCODONE-ACETAMINOPHEN 5-325 MG PO TABS Oral Take 1 tablet by mouth every 4 (four) hours as needed.    Marland Kitchen PROCHLORPERAZINE MALEATE 10 MG PO TABS Oral Take 1 tablet (10 mg total) by mouth every 6 (six) hours as needed. 30 tablet 0  . SULFAMETHOXAZOLE-TMP DS 800-160 MG PO TABS Oral Take 1 tablet by mouth See admin instructions. 1 tab BID  on Mon, Wed and Fri      BP 130/76  Pulse 108  Temp 99.9 F (37.7 C) (Oral)  Resp 16  SpO2 100%  LMP 08/09/2011  Physical Exam  Nursing note and vitals reviewed. Constitutional:       Awake, alert, nontoxic appearance with baseline speech for patient.  HENT:  Head: Atraumatic.  Mouth/Throat: No oropharyngeal exudate.  Eyes: EOM are normal. Pupils are equal, round, and reactive to light. Right eye exhibits no discharge. Left eye exhibits no discharge.  Neck: Neck supple.  Cardiovascular: Regular rhythm.   No murmur heard.      Tachycardic  Pulmonary/Chest: Effort normal and breath sounds normal. No stridor. No respiratory distress. She has no wheezes. She has no rales. She exhibits no tenderness.  Abdominal: Soft. Bowel sounds are normal. She exhibits no mass. There is no  tenderness. There is no rebound.  Musculoskeletal: She exhibits no tenderness.       Baseline ROM, moves extremities with no obvious new focal weakness.  Lymphadenopathy:    She has no cervical adenopathy.  Neurological: She is alert.       Awake, alert, cooperative and aware of situation; motor strength bilaterally; sensation normal to light touch bilaterally; peripheral visual fields full to confrontation; no facial asymmetry; tongue midline; major cranial nerves appear intact; no pronator drift, normal finger to nose bilaterally, baseline gait without new ataxia.  Skin: No rash noted.  Psychiatric: She has a normal mood and affect.    ED Course  Procedures (including critical care time) CRITICAL CARE Performed by: Hurman Horn   Total critical care time:  Critical care time was exclusive of separately billable procedures and treating other patients.  Critical care was necessary to treat or prevent imminent or life-threatening deterioration.  Critical care was time spent personally by me on the following activities: development of treatment plan with patient and/or surrogate as well as nursing, discussions with consultants, evaluation of patient's response to treatment, examination of patient, obtaining history from patient or surrogate, ordering and performing treatments and interventions, ordering and review of laboratory studies, ordering and review of radiographic studies, pulse oximetry and re-evaluation of patient's condition. Labs Reviewed  CBC WITH DIFFERENTIAL - Abnormal; Notable for the following:    WBC <1.0 (*)     RBC 3.05 (*)     Hemoglobin 8.4 (*)     HCT 24.1 (*)     Platelets <5 (*)     All other components within normal limits  COMPREHENSIVE METABOLIC PANEL - Abnormal; Notable for the following:    Potassium 3.2 (*)     Glucose, Bld 125 (*)     GFR calc non Af Amer 81 (*)     All other components within normal limits  CULTURE, BLOOD (ROUTINE X 2)    CULTURE, BLOOD (ROUTINE X 2)  LACTIC ACID, PLASMA  PROCALCITONIN  URINE CULTURE   No results found.   1. Neutropenic fever   2. Pancytopenia       MDM  Pt stable in ED with no significant deterioration in condition.Patient / Family / Caregiver informed of clinical course, understand medical decision-making process, and agree with plan.The patient appears reasonably stabilized for transfer considering the current resources, flow, and capabilities available in the ED at this time, and I doubt any other [facility] requiring further screening and/or treatment in the ED prior to transfer.       Hurman Horn, MD 10/12/11 1630

## 2011-10-08 NOTE — ED Notes (Signed)
Patient denies pain and discomfort at this time, Explained transfer with [facility].

## 2011-10-08 NOTE — ED Notes (Signed)
Patient transported to X-ray 

## 2011-10-08 NOTE — ED Notes (Signed)
Pt.tated, I just woke up with chills and a fever of 100.8.  I came here instead of [facility].  I have leukemia and last treatment was Aug. 27

## 2011-10-08 NOTE — ED Notes (Signed)
Patient states headache pain is relieved.

## 2011-10-08 NOTE — ED Notes (Signed)
IV Team notified that patient has a port that needs to be accessed for blood cultures , labs  IV fluid and medications.

## 2011-10-08 NOTE — ED Notes (Signed)
Placed on [facility] protocol

## 2011-10-08 NOTE — ED Notes (Signed)
IV TEAM and Lab in room with patient

## 2011-10-09 ENCOUNTER — Telehealth: Payer: Self-pay | Admitting: Medical Oncology

## 2011-10-09 ENCOUNTER — Other Ambulatory Visit: Payer: 59 | Admitting: Lab

## 2011-10-09 ENCOUNTER — Encounter: Payer: Self-pay | Admitting: Oncology

## 2011-10-09 NOTE — Progress Notes (Signed)
The patient has been receiving chemotherapy at [facility] in recent weeks. I believe she has either a Hickman or other central catheter. The patient received a bag of irradiated platelets on 10/06/2011 when her platelet count was 9000. White count was less than 0.2, hemoglobin 9.0 hematocrit 25.9. During the platelet transfusion the patient experienced urticaria and was evaluated by Norina Buzzard who left a documented note in the record.  Patient came to the emergency room with chills and fever on 10/08/2011. White count was less than 1.0, hemoglobin 8.4,  hematocrit 24.1, and platelets were less than 5000. The patient was transferred to [facility] for admission.

## 2011-10-09 NOTE — Telephone Encounter (Signed)
Pt called to cancel her appts today. She was admitted to [facility] over the week-end. Dr. Arline Asp is aware.

## 2011-10-09 NOTE — Telephone Encounter (Signed)
[facility] lab called positive blood culture report of gram positvie cocci in chains. I called this report to [facility]. I also faxed report.

## 2011-10-09 NOTE — ED Notes (Signed)
[facility] lab called with + blood  Culture results -gram positive cocci in chains. Report called to Dr Vedia Pereyra office.Voice mail message left on nurse line. Current scriber will follow up with nurse later on today.

## 2011-10-10 ENCOUNTER — Encounter: Payer: Self-pay | Admitting: *Deleted

## 2011-10-10 ENCOUNTER — Telehealth: Payer: Self-pay

## 2011-10-10 NOTE — Telephone Encounter (Signed)
Confirmed pt is in [facility] hospital at present.

## 2011-10-11 ENCOUNTER — Other Ambulatory Visit: Payer: 59 | Admitting: Lab

## 2011-10-11 ENCOUNTER — Ambulatory Visit: Payer: 59 | Admitting: Oncology

## 2011-10-11 LAB — CULTURE, BLOOD (ROUTINE X 2)

## 2011-10-13 ENCOUNTER — Other Ambulatory Visit: Payer: 59 | Admitting: Lab

## 2011-10-14 LAB — CULTURE, BLOOD (ROUTINE X 2): Culture: NO GROWTH

## 2011-10-19 ENCOUNTER — Telehealth: Payer: Self-pay

## 2011-10-19 NOTE — Telephone Encounter (Signed)
S/w pt: she had stem cell collection Monday 9/16 and was discharged Tues 9/17. She called earlier about appts here. She has been having a fever off and on, last night 102.6. The hickman catheter is removed at [facility]. She is on an antibiotic. She has SOB walking in her house. She has a phone call in to [facility] and is waiting for a return call at present. I asked her to have [facility] fax [place] any instructions we need. She expressed willingness for this. Her next appt with [place] is 9/27 for lab and MD.

## 2011-10-20 ENCOUNTER — Other Ambulatory Visit: Payer: 59 | Admitting: Lab

## 2011-10-20 ENCOUNTER — Telehealth: Payer: Self-pay

## 2011-10-20 NOTE — Telephone Encounter (Signed)
S/w Delice Bison RN and asked for any orders for frequent labs as pt is post stem cell collection. Delice Bison did not find what instructions given to pt from yesterday's question about fever. Nithya is not in hospital.

## 2011-10-23 ENCOUNTER — Telehealth: Payer: Self-pay

## 2011-10-23 NOTE — Telephone Encounter (Signed)
Lynn [name] called and said at present we do not to do any labs or such for pt. Pt has appt at [facility] Oct 3 and they will let us know when we need to start labs again.

## 2011-10-27 ENCOUNTER — Other Ambulatory Visit (HOSPITAL_BASED_OUTPATIENT_CLINIC_OR_DEPARTMENT_OTHER): Payer: 59 | Admitting: Lab

## 2011-10-27 ENCOUNTER — Telehealth: Payer: Self-pay | Admitting: *Deleted

## 2011-10-27 ENCOUNTER — Ambulatory Visit: Payer: 59 | Admitting: Oncology

## 2011-10-27 ENCOUNTER — Encounter: Payer: Self-pay | Admitting: Family

## 2011-10-27 ENCOUNTER — Ambulatory Visit (HOSPITAL_BASED_OUTPATIENT_CLINIC_OR_DEPARTMENT_OTHER): Payer: 59 | Admitting: Family

## 2011-10-27 ENCOUNTER — Telehealth: Payer: Self-pay | Admitting: Oncology

## 2011-10-27 VITALS — BP 120/75 | HR 85 | Temp 98.3°F | Resp 20 | Ht 68.0 in | Wt 256.7 lb

## 2011-10-27 DIAGNOSIS — Z86711 Personal history of pulmonary embolism: Secondary | ICD-10-CM

## 2011-10-27 DIAGNOSIS — C9100 Acute lymphoblastic leukemia not having achieved remission: Secondary | ICD-10-CM

## 2011-10-27 LAB — CBC WITH DIFFERENTIAL/PLATELET
Basophils Absolute: 0.3 10*3/uL — ABNORMAL HIGH (ref 0.0–0.1)
Eosinophils Absolute: 0.1 10*3/uL (ref 0.0–0.5)
HCT: 25.7 % — ABNORMAL LOW (ref 34.8–46.6)
HGB: 8.6 g/dL — ABNORMAL LOW (ref 11.6–15.9)
MCH: 28.3 pg (ref 25.1–34.0)
MONO#: 1.1 10*3/uL — ABNORMAL HIGH (ref 0.1–0.9)
NEUT%: 35.2 % — ABNORMAL LOW (ref 38.4–76.8)
WBC: 9 10*3/uL (ref 3.9–10.3)
lymph#: 4.4 10*3/uL — ABNORMAL HIGH (ref 0.9–3.3)

## 2011-10-27 LAB — BASIC METABOLIC PANEL (CC13)
BUN: 9 mg/dL (ref 7.0–26.0)
CO2: 22 mEq/L (ref 22–29)
Chloride: 110 mEq/L — ABNORMAL HIGH (ref 98–107)
Creatinine: 0.9 mg/dL (ref 0.6–1.1)

## 2011-10-27 NOTE — Telephone Encounter (Signed)
s.w. pt regarding time to be here on 9.30.13...told pt to come by a get schedule....Marland Kitchensed

## 2011-10-27 NOTE — Progress Notes (Signed)
Patient ID: Lynn [name], female   DOB: 06-Jul-1965, 46 y.o.   MRN: [id number] CSN: [id number]  CC: [name], MD  [name], MD [name], MD   Problem List: [name] is a 46 y.o. African-Americanican female with a problem list consisting of:  1. B-cell acute lymphoblastic leukemia, Philadelphia chromosome positive, t (9; 22) initially with 71% blasts seen on bone marrow  carried out on Jun 19, 2011. Subsequent bone marrow at [facility] on 06/22/2011 showed 98% blasts and peripheral blood showed 71% blasts. The patient received induction treatment with dasatinib (Sprycel) and Decadron. Her  hospital course was complicated by the development of a posterior fossa subdural hematoma requiring a suboccipital craniotomy and  evacuation of the hematoma on 07/04/2011. The patient also had neutropenic fever with negative cultures covered with broad-spectrum antibiotics. She had some headaches, vaginal bleeding and transaminitis. Admission to [facility] was from 06/21/2011 through 07/17/2011. Bone marrow carried out on 07/06/2011 showed a hypocellular bone marrow with no evidence for ALL.  2. Pulmonary emboli involving the right upper and right lower lobes with positive CT chest angiogram on 06/17/2011 and negative  Dopplers.  3. Development of subdural hematoma involving the posterior fossa when the patient was hospitalized at [facility], status post suboccipital craniotomy and evacuation of hematoma on 07/04/2011.  4. History of hypertension.  5. Morbid obesity.  6. Arthritis involving the right hip.  7. History of palpitations.  8. History of migraine headaches.  9. Placement of inferior vena cava Greenfield filter on 07/07/2011.  10. Mild renal insufficiency  11. Right-sided Port-A-Cath placed at [facility] on 07/12/2011.  12. Stem cell  collection at [facility] on 10/16/2011 and Hickman Catheter was removed.  Discharged from facility on 10/17/2011  Dr. Arline Asp and I saw Lynn [name] today for follow up of her B-cell acute lymphoblastic leukemia, pulmonary emboli and subdural hematoma. She was last seen by [place] on 08/17/2011.  It will be recalled that the patient was hospitalized electively at [facility] from 08/07/2011 through 08/11/2011. During that admission, she received intravenous chemotherapy as well as intrathecal methotrexate which was given on July 9th followed by 2-3 days of IV and subsequently oral leucovorin. She was also readmitted electively to [facility] this Monday from 08/21/2011 through 08/25/2011 and then again on 09/04/2011 again to receive combination of intravenous and intrathecal chemotherapy. In the meantime, she is a candidate for an allogeneic transplant. Lynn [name] has 7 sisters, no brothers. The family is being screened to see if there is a suitable donor for an allogeneic bone marrow transplant. Lynn [name] was recently at [facility] on 10/16/2011-10/17/2011 and Hickman Catheter was removed.  She is scheduled to return to [facility] on 11/02/2011 for a bone marrow biopsy and then again on 11/14/2011 for a bone marrow transplant.  The patient states that she has been feeling great and denies fever, chills, unusual bleeding/bruising, N/V/D or constipation, SOB or any other symptoms.  Lynn [name] has intermittent headaches that are well controlled with medication.     Past Medical History: Past Medical History  Diagnosis Date  . Hypertension   . ALL (acute lymphoblastic leukemia)   . Leukemia     Surgical History: Past Surgical History  Procedure Date  . Cesarean section   . Foot surgery   . Foot surgery   .  Cesarean section   . Eye surgery   . Craniotomy     Current Medications: Current Outpatient Prescriptions  Medication Sig Dispense  Refill  . acyclovir (ZOVIRAX) 400 MG tablet Take 400 mg by mouth 2 (two) times daily.      . butalbital-acetaminophen-caffeine (FIORICET, ESGIC) 50-325-40 MG per tablet Take 1-2 tablets by mouth every 6 (six) hours as needed.      . cetirizine (ZYRTEC) 10 MG tablet Take 10 mg by mouth daily.      . dasatinib (SPRYCEL) 20 MG tablet Take 40 mg by mouth daily.      . dasatinib (SPRYCEL) 50 MG tablet Take 100 mg by mouth daily. Last dose 6/30 - may or may not resume      . oxycodone (OXY-IR) 5 MG capsule Take 5 mg by mouth every 4 (four) hours as needed.      Marland Kitchen oxyCODONE-acetaminophen (PERCOCET/ROXICET) 5-325 MG per tablet Take 1 tablet by mouth every 4 (four) hours as needed.      . potassium chloride (MICRO-K) 10 MEQ CR capsule Take 20 mEq by mouth 2 (two) times daily.      . prochlorperazine (COMPAZINE) 10 MG tablet Take 1 tablet (10 mg total) by mouth every 6 (six) hours as needed.  30 tablet  0  . sulfamethoxazole-trimethoprim (BACTRIM DS) 800-160 MG per tablet Take 1 tablet by mouth See admin instructions. 1 tab BID on Mon, Wed and Fri      . SUMAtriptan (IMITREX) 50 MG tablet Take 50 mg by mouth every 6 (six) hours as needed.        Allergies: No Known Allergies  Family History: Family History  Problem Relation Age of Onset  . Cancer Father   . Cancer Mother     colon    Social History: History  Substance Use Topics  . Smoking status: Never Smoker   . Smokeless tobacco: Never Used  . Alcohol Use: No    Review of Systems: 10 Point review of systems was completed and is negative except as noted above.   Physical Exam:   Blood pressure 120/75, pulse 85, temperature 98.3 F (36.8 C), temperature source Oral, resp. rate 20, height 5\' 8"  (1.727 m), weight 256 lb 11.2 oz (116.438 kg), last menstrual period 08/09/2011.  General appearance: Alert, cooperative, obese, well nourished, no apparent distress Head: Normocephalic, without obvious abnormality, atraumatic, complete hair  loss Eyes: Conjunctivae/corneas clear, PERRLA, EOMI Nose: Nares, septum and mucosa are normal, no drainage or sinus tenderness Neck: No adenopathy, excessive habitus,  supple, symmetrical, trachea midline, thyroid not enlarged, no tenderness Resp: Clear to auscultation bilaterally Cardio: Regular rate and rhythm, S1, S2 normal, no murmur, click, rub or gallop, right chest Port-A-Cath w/o signs of infection GI: Soft, distended, excessive habitus,  non-tender, hypoactive bowel sounds, no organomegaly Extremities: extremities normal, atraumatic, no cyanosis or edema Lymph nodes: Cervical, supraclavicular, and axillary nodes normal Neurologic: Grossly normal   Laboratory Data: Results for orders placed in visit on 10/27/11 (from the past 48 hour(s))  CBC WITH DIFFERENTIAL     Status: Abnormal   Collection Time   10/27/11 11:29 AM      Component Value Range Comment   WBC 9.0  3.9 - 10.3 10e3/uL    NEUT# 3.2  1.5 - 6.5 10e3/uL    HGB 8.6 (*) 11.6 - 15.9 g/dL    HCT 16.1 (*) 09.6 - 46.6 %    Platelets 530 (*) 145 - 400 10e3/uL    MCV  84.3  79.5 - 101.0 fL    MCH 28.3  25.1 - 34.0 pg    MCHC 33.6  31.5 - 36.0 g/dL    RBC 4.54 (*) 0.98 - 5.45 10e6/uL    RDW 14.6 (*) 11.2 - 14.5 %    lymph# 4.4 (*) 0.9 - 3.3 10e3/uL    MONO# 1.1 (*) 0.1 - 0.9 10e3/uL    Eosinophils Absolute 0.1  0.0 - 0.5 10e3/uL    Basophils Absolute 0.3 (*) 0.0 - 0.1 10e3/uL    NEUT% 35.2 (*) 38.4 - 76.8 %    LYMPH% 48.0  14.0 - 49.7 %    MONO% 12.2  0.0 - 14.0 %    EOS% 1.5  0.0 - 7.0 %    BASO% 3.1 (*) 0.0 - 2.0 %   BASIC METABOLIC PANEL (CC13)     Status: Abnormal   Collection Time   10/27/11 11:29 AM      Component Value Range Comment   Sodium 141  136 - 145 mEq/L    Potassium 4.6  3.5 - 5.1 mEq/L    Chloride 110 (*) 98 - 107 mEq/L    CO2 22  22 - 29 mEq/L    Glucose 103 (*) 70 - 99 mg/dl    BUN 9.0  7.0 - 11.9 mg/dL    Creatinine 0.9  0.6 - 1.1 mg/dL    Calcium 9.4  8.4 - 14.7 mg/dL   MAGNESIUM (WG95)      Status: Normal   Collection Time   10/27/11 11:29 AM      Component Value Range Comment   Magnesium 2.1  1.5 - 2.5 mg/dl      Imaging Studies: 1. CT angiogram of the chest on 06/17/2011 showed right-sided pulmonary emboli. There were mildly prominent mediastinal and  right hilar lymph nodes.  2. CT-guided iliac bone aspiration and core biopsy were carried out on 06/19/2011.  3. CT of the head without IV contrast on 07/23/2011 showed that the patient was status post suboccipital craniotomy and right frontal  bur hole placement. Otherwise negative noncontrast CT appearance of the brain. 4.  Diagnostic CXR 10/08/2011  Showed no radiographic evidence of acute cardiopulmonary disease.       Impression/Plan: Lynn [name] seems to be doing well at the present time.  She has resumed taking Dasatinib and will be returning to [facility] next month for a bone marrow transplant. Her hemoglobin is 8.6 today and on the cusp of where [facility] would like her transfused (8.5).  We plan to check labs in 3 days and she is scheduled for a blood transfusion of PRBCs if her hemoglobin is 8.5 or below.  The patient was inquired about receiving the influenza vaccination and we asked her to follow up with her physicians at [facility] about the vaccination.  We will plan to see Lynn [name] again in about 2 months' time for laboratories (CMP, CBC, LDH and Uric Acid) and an office visit with Dr. Arline Asp.     Larina Bras, NP-C 10/27/2011, 11:42 AM

## 2011-10-27 NOTE — Patient Instructions (Addendum)
Speak with your physicians at [facility] about obtaining influenza vaccination.

## 2011-10-27 NOTE — Telephone Encounter (Signed)
Per staff message and POF I have scheduled apapt. JMW  

## 2011-10-30 ENCOUNTER — Ambulatory Visit: Payer: 59

## 2011-10-30 ENCOUNTER — Encounter: Payer: Self-pay | Admitting: Oncology

## 2011-10-30 ENCOUNTER — Other Ambulatory Visit (HOSPITAL_BASED_OUTPATIENT_CLINIC_OR_DEPARTMENT_OTHER): Payer: 59

## 2011-10-30 DIAGNOSIS — C9100 Acute lymphoblastic leukemia not having achieved remission: Secondary | ICD-10-CM

## 2011-10-30 LAB — CBC WITH DIFFERENTIAL/PLATELET
Basophils Absolute: 0.3 10*3/uL — ABNORMAL HIGH (ref 0.0–0.1)
EOS%: 3.6 % (ref 0.0–7.0)
HGB: 8.9 g/dL — ABNORMAL LOW (ref 11.6–15.9)
MCH: 29.3 pg (ref 25.1–34.0)
NEUT#: 2.5 10*3/uL (ref 1.5–6.5)
RBC: 3.03 10*6/uL — ABNORMAL LOW (ref 3.70–5.45)
RDW: 14.9 % — ABNORMAL HIGH (ref 11.2–14.5)
lymph#: 2.7 10*3/uL (ref 0.9–3.3)

## 2011-10-30 LAB — HOLD TUBE, BLOOD BANK

## 2011-10-30 NOTE — Progress Notes (Signed)
We have a note from [facility] dated 10/19/2011.  The patient was rolled on CALGB 14782. On 06/23/2011 she was started on dasatinib 140 mg by mouth daily and dexamethasone by mouth daily for 7 days. The patient received only 6 days of dasatinib secondary to liver function test abnormalities. Day 15 bone marrow on 07/06/2011 revealed a hypocellular marrow with no evidence of leukemia.  On 07/12/2011, the patient received another course of dasatinib with the dosage reduced to 100 mg daily in combination with dexamethasone daily for 7 days. On day 22, 07/28/2011, a bone marrow revealed panhypoplasia with no evidence of acute leukemia. Cytogenetics were normal and FISH studies were negative for t(9;22). PCR for p190 in both the peripheral blood and bone marrow returned negative at 0.000.  On 08/08/2011 the patient received high-dose methotrexate with leucovorin rescue, IV vincristine 2.0 mg and CNS prophylaxis with intrathecal methotrexate/hydrocortisone. CSF was negative.  On 09/18/2011 bone marrow biopsy showed no evidence of ALL. PCR for BCR/ABL showed 0.00001 fusion events and it was felt that the patient had achieved a complete hematologic response.  On 09/26/2011 mobilization therapy with cytarabine/etoposide was started. The note from 10/19/2011 stated that the patient was status post mobilizing therapy and stem cell collection. The plan was to proceed with autologous stem cell transplant. I believe that the patient is scheduled for her transplant somewhere around 11/02/2011.

## 2011-10-30 NOTE — Patient Instructions (Signed)
Pt discharged to home.  Instructed to call if symptomatic.

## 2011-11-27 ENCOUNTER — Other Ambulatory Visit: Payer: Self-pay | Admitting: Medical Oncology

## 2011-11-27 ENCOUNTER — Telehealth: Payer: Self-pay | Admitting: Medical Oncology

## 2011-11-27 DIAGNOSIS — C9100 Acute lymphoblastic leukemia not having achieved remission: Secondary | ICD-10-CM

## 2011-11-27 DIAGNOSIS — T8092XA Unspecified transfusion reaction, initial encounter: Secondary | ICD-10-CM | POA: Insufficient documentation

## 2011-11-27 NOTE — Telephone Encounter (Signed)
[facility] She will give appointment to pt before discharge.

## 2011-11-27 NOTE — Telephone Encounter (Signed)
Lynn [name] called from [facility] to let us know that pt will be discharged either tomorrow or Wed. She is going to fax over orders for labs and guidelines for transfusions. I told her we will set up appointments and call pt with 10/31 appointments.

## 2011-11-27 NOTE — Telephone Encounter (Signed)
Per robin she s/w the nurse at [facility] with appts   aom

## 2011-11-30 ENCOUNTER — Other Ambulatory Visit: Payer: 59 | Admitting: Lab

## 2011-11-30 ENCOUNTER — Other Ambulatory Visit (HOSPITAL_BASED_OUTPATIENT_CLINIC_OR_DEPARTMENT_OTHER): Payer: 59 | Admitting: Lab

## 2011-11-30 ENCOUNTER — Ambulatory Visit (HOSPITAL_BASED_OUTPATIENT_CLINIC_OR_DEPARTMENT_OTHER): Payer: 59

## 2011-11-30 ENCOUNTER — Telehealth: Payer: Self-pay | Admitting: *Deleted

## 2011-11-30 VITALS — BP 135/79 | HR 86 | Temp 98.2°F

## 2011-11-30 DIAGNOSIS — C9100 Acute lymphoblastic leukemia not having achieved remission: Secondary | ICD-10-CM

## 2011-11-30 DIAGNOSIS — C9590 Leukemia, unspecified not having achieved remission: Secondary | ICD-10-CM

## 2011-11-30 DIAGNOSIS — N289 Disorder of kidney and ureter, unspecified: Secondary | ICD-10-CM

## 2011-11-30 DIAGNOSIS — Z452 Encounter for adjustment and management of vascular access device: Secondary | ICD-10-CM

## 2011-11-30 LAB — CBC WITH DIFFERENTIAL/PLATELET
BASO%: 0.5 % (ref 0.0–2.0)
Basophils Absolute: 0 10*3/uL (ref 0.0–0.1)
EOS%: 0.2 % (ref 0.0–7.0)
HCT: 30.8 % — ABNORMAL LOW (ref 34.8–46.6)
HGB: 10.7 g/dL — ABNORMAL LOW (ref 11.6–15.9)
LYMPH%: 32.1 % (ref 14.0–49.7)
MCH: 30.4 pg (ref 25.1–34.0)
MCHC: 34.7 g/dL (ref 31.5–36.0)
MCV: 87.5 fL (ref 79.5–101.0)
MONO#: 0.8 10*3/uL (ref 0.1–0.9)
MONO%: 29.7 % — ABNORMAL HIGH (ref 0.0–14.0)
NEUT%: 37.5 % — ABNORMAL LOW (ref 38.4–76.8)
Platelets: 36 10*3/uL — ABNORMAL LOW (ref 145–400)
RBC: 3.52 10*6/uL — ABNORMAL LOW (ref 3.70–5.45)
lymph#: 0.8 10*3/uL — ABNORMAL LOW (ref 0.9–3.3)

## 2011-11-30 LAB — BASIC METABOLIC PANEL (CC13)
BUN: <2 mg/dL — ABNORMAL LOW (ref 7–26)
CO2: 27 mEq/L (ref 22–29)
Calcium: 8.8 mg/dL (ref 8.4–10.4)
Chloride: 107 mEq/L (ref 98–107)
Creatinine: 0.7 mg/dL (ref 0.6–1.1)

## 2011-11-30 MED ORDER — SODIUM CHLORIDE 0.9 % IJ SOLN
10.0000 mL | INTRAMUSCULAR | Status: DC | PRN
  Administered 2011-11-30: 10 mL via INTRAVENOUS
  Filled 2011-11-30: qty 10

## 2011-11-30 MED ORDER — HEPARIN SOD (PORK) LOCK FLUSH 100 UNIT/ML IV SOLN
500.0000 [IU] | Freq: Once | INTRAVENOUS | Status: AC
  Administered 2011-11-30: 500 [IU] via INTRAVENOUS
  Filled 2011-11-30: qty 5

## 2011-11-30 NOTE — Telephone Encounter (Signed)
error 

## 2011-11-30 NOTE — Patient Instructions (Signed)
Call MD for problems 

## 2011-11-30 NOTE — Telephone Encounter (Signed)
Called patient to discuss lab results from today's labs, gave neutropenic precautions, Hgb looks good at 10.7, Plts are at 36, no need for transfusion at this time per [facility] protocol unless <20K. Patient denies any bleeding or bruising at this time. She states she is very aware and observant of signs/symptoms to look for. She will be calling [facility] this afternoon and asking them to forward her discharge from 11/28/11. Today is day 15 autologous stem cell transplant that was done on 11/16/11. Patient understands to call or go to ER if any bleeding noted or fever.

## 2011-12-04 ENCOUNTER — Other Ambulatory Visit: Payer: 59 | Admitting: Lab

## 2011-12-04 ENCOUNTER — Other Ambulatory Visit (HOSPITAL_BASED_OUTPATIENT_CLINIC_OR_DEPARTMENT_OTHER): Payer: Self-pay | Admitting: Lab

## 2011-12-04 ENCOUNTER — Encounter: Payer: Self-pay | Admitting: *Deleted

## 2011-12-04 ENCOUNTER — Ambulatory Visit (HOSPITAL_BASED_OUTPATIENT_CLINIC_OR_DEPARTMENT_OTHER): Payer: Self-pay

## 2011-12-04 VITALS — BP 134/76 | HR 98 | Temp 98.6°F | Resp 18

## 2011-12-04 DIAGNOSIS — C9590 Leukemia, unspecified not having achieved remission: Secondary | ICD-10-CM

## 2011-12-04 DIAGNOSIS — C9100 Acute lymphoblastic leukemia not having achieved remission: Secondary | ICD-10-CM

## 2011-12-04 DIAGNOSIS — Z452 Encounter for adjustment and management of vascular access device: Secondary | ICD-10-CM

## 2011-12-04 LAB — BASIC METABOLIC PANEL (CC13)
CO2: 25 mEq/L (ref 22–29)
Chloride: 108 mEq/L — ABNORMAL HIGH (ref 98–107)
Potassium: 3.5 mEq/L (ref 3.5–5.1)

## 2011-12-04 LAB — HOLD TUBE, BLOOD BANK

## 2011-12-04 LAB — CBC WITH DIFFERENTIAL/PLATELET
BASO%: 1 % (ref 0.0–2.0)
Basophils Absolute: 0 10*3/uL (ref 0.0–0.1)
EOS%: 1.9 % (ref 0.0–7.0)
HCT: 33.3 % — ABNORMAL LOW (ref 34.8–46.6)
HGB: 11.5 g/dL — ABNORMAL LOW (ref 11.6–15.9)
MCH: 30.4 pg (ref 25.1–34.0)
MCHC: 34.5 g/dL (ref 31.5–36.0)
MONO#: 1.1 10*3/uL — ABNORMAL HIGH (ref 0.1–0.9)
NEUT%: 32 % — ABNORMAL LOW (ref 38.4–76.8)
RDW: 16.7 % — ABNORMAL HIGH (ref 11.2–14.5)
WBC: 3.3 10*3/uL — ABNORMAL LOW (ref 3.9–10.3)
lymph#: 1 10*3/uL (ref 0.9–3.3)

## 2011-12-04 MED ORDER — HEPARIN SOD (PORK) LOCK FLUSH 100 UNIT/ML IV SOLN
500.0000 [IU] | Freq: Once | INTRAVENOUS | Status: AC
  Administered 2011-12-04: 500 [IU] via INTRAVENOUS
  Filled 2011-12-04: qty 5

## 2011-12-04 MED ORDER — SODIUM CHLORIDE 0.9 % IJ SOLN
10.0000 mL | INTRAMUSCULAR | Status: DC | PRN
  Administered 2011-12-04: 10 mL via INTRAVENOUS
  Filled 2011-12-04: qty 10

## 2011-12-04 NOTE — Progress Notes (Signed)
RECEIVED A FAX FROM [facility] CONCERNING PT.'S DISCHARGE SUMMARY. THIS INFORMATION WAS GIVEN TO DR.MURINSON'S NURSE, ROBIN BASS,RN.

## 2011-12-07 ENCOUNTER — Ambulatory Visit (HOSPITAL_BASED_OUTPATIENT_CLINIC_OR_DEPARTMENT_OTHER): Payer: Medicaid Other | Admitting: Oncology

## 2011-12-07 ENCOUNTER — Telehealth: Payer: Self-pay

## 2011-12-07 ENCOUNTER — Telehealth: Payer: Self-pay | Admitting: Oncology

## 2011-12-07 ENCOUNTER — Encounter: Payer: Self-pay | Admitting: Oncology

## 2011-12-07 ENCOUNTER — Ambulatory Visit: Payer: Self-pay

## 2011-12-07 ENCOUNTER — Other Ambulatory Visit (HOSPITAL_BASED_OUTPATIENT_CLINIC_OR_DEPARTMENT_OTHER): Payer: Medicaid Other | Admitting: Lab

## 2011-12-07 VITALS — BP 150/88 | HR 100 | Temp 97.4°F | Resp 20 | Ht 68.0 in | Wt 247.7 lb

## 2011-12-07 VITALS — BP 135/68 | HR 100 | Temp 97.4°F

## 2011-12-07 DIAGNOSIS — C9590 Leukemia, unspecified not having achieved remission: Secondary | ICD-10-CM

## 2011-12-07 DIAGNOSIS — I2699 Other pulmonary embolism without acute cor pulmonale: Secondary | ICD-10-CM

## 2011-12-07 DIAGNOSIS — C9100 Acute lymphoblastic leukemia not having achieved remission: Secondary | ICD-10-CM

## 2011-12-07 DIAGNOSIS — N289 Disorder of kidney and ureter, unspecified: Secondary | ICD-10-CM

## 2011-12-07 LAB — CBC WITH DIFFERENTIAL/PLATELET
Basophils Absolute: 0 10*3/uL (ref 0.0–0.1)
Eosinophils Absolute: 0.3 10*3/uL (ref 0.0–0.5)
HCT: 32.5 % — ABNORMAL LOW (ref 34.8–46.6)
HGB: 11 g/dL — ABNORMAL LOW (ref 11.6–15.9)
LYMPH%: 33.5 % (ref 14.0–49.7)
MONO#: 0.8 10*3/uL (ref 0.1–0.9)
NEUT%: 27 % — ABNORMAL LOW (ref 38.4–76.8)
Platelets: 135 10*3/uL — ABNORMAL LOW (ref 145–400)
WBC: 2.8 10*3/uL — ABNORMAL LOW (ref 3.9–10.3)
lymph#: 0.9 10*3/uL (ref 0.9–3.3)

## 2011-12-07 LAB — BASIC METABOLIC PANEL (CC13)
BUN: 9 mg/dL (ref 7.0–26.0)
CO2: 25 mEq/L (ref 22–29)
Chloride: 108 mEq/L — ABNORMAL HIGH (ref 98–107)
Creatinine: 0.7 mg/dL (ref 0.6–1.1)
Glucose: 117 mg/dl — ABNORMAL HIGH (ref 70–99)
Potassium: 3.5 mEq/L (ref 3.5–5.1)

## 2011-12-07 MED ORDER — HEPARIN SOD (PORK) LOCK FLUSH 100 UNIT/ML IV SOLN
500.0000 [IU] | Freq: Once | INTRAVENOUS | Status: AC
  Administered 2011-12-07: 500 [IU] via INTRAVENOUS
  Filled 2011-12-07: qty 5

## 2011-12-07 MED ORDER — SODIUM CHLORIDE 0.9 % IJ SOLN
10.0000 mL | INTRAMUSCULAR | Status: DC | PRN
  Administered 2011-12-07: 10 mL via INTRAVENOUS
  Filled 2011-12-07: qty 10

## 2011-12-07 NOTE — Telephone Encounter (Signed)
S/w pt re appt for 12/11/11 and 02/08/12.

## 2011-12-07 NOTE — Patient Instructions (Addendum)
Labs on 11/11.  Let us know about future labs requested by [facility].  Cancel appt for 11/26.  Labs and appt---02/08/2012.

## 2011-12-07 NOTE — Telephone Encounter (Signed)
S/w Becky at [facility] that Dr Arline Asp added lab appt for 12/11/11 and that pt will be going to [facility] on 12/15/11. Kriste Basque stated that was OK per their protocol.

## 2011-12-07 NOTE — Progress Notes (Signed)
This office note has been dictated.  #[id number]

## 2011-12-08 NOTE — Progress Notes (Signed)
CC:   [name], MD [name], [name] [name], MD  PROBLEM LIST:  1. B-cell acute lymphoblastic leukemia, Philadelphia chromosome  positive, t(9; 22) initially with 71% blasts seen on bone marrow  carried out on Jun 19, 2011. Subsequent bone marrow at [facility] on 06/22/2011 showed 98% blasts  and peripheral blood showed 71% blasts. The patient received  induction treatment with dasatinib (Sprycel) and Decadron. Her  hospital course was complicated by the development of a posterior  fossa subdural hematoma requiring a suboccipital craniotomy and  evacuation of the hematoma on 07/04/2011. The patient also had  neutropenic fever with negative cultures covered with broad-  spectrum antibiotics. She had some headaches, vaginal bleeding and  transaminitis. Admission to [facility] was from 06/21/2011 through 07/17/2011. Bone marrow carried  out on 07/06/2011 showed a hypocellular bone marrow with no  evidence for ALL.   The patient was enrolled on protocol CALGB 16109.  That protocol consisted of treatment with dasatinib along with chemotherapy.  A bone marrow carried out on 07/28/2011 showed pan hypoplasia with no evidence for acute leukemia.  Cytogenetics were normal.  FISH studies were negative for t(9; 22).  PCR for small p190 in both the peripheral blood and bone marrow returned negative at 0.000.  On 08/08/2011 the patient received high-dose methotrexate with leucovorin rescue.  She also received IV vincristine 2.0 mg and CNS prophylaxis with intrathecal methotrexate/hydrocortisone.  CSF was negative.  On 09/18/2011 bone marrow biopsy showed no evidence of ALL.  PCR for BCR/ABL showed 0.00001 fusion events.  It was felt that the patient had achieved a complete hematologic response.  On 09/26/2011 mobilization therapy with cytarabine/etoposide was started  The patient was admitted to the hospital  from 11/14/2011 through 11/28/2011, at which time she received high-dose therapy and her autologous stem cell transplant.  She received melphalan on days -2 and days -1.  Day 0 was 11/16/2011.  The patient also received G-CSF.  She had severe mucositis and dysphagia on 11/20/2011 which required morphine by PCA.  She had some fevers on 11/23/2011.  She was treated with antibiotics for prophylaxis.  She had some headaches, but those have subsequently resolved.  Venous Dopplers of her lower extremities were done bilaterally because of some swelling of her legs.  These were negative for blood clots.  It will be recalled that the patient does have an inferior vena cava Greenfield filter that was placed on 07/07/2011 for her prior history of pulmonary emboli.  As stated, the patient was discharged from [facility] on October 29th.  She is currently on no treatment; however, plans will be for her to restart dasatinib at some point within the next couple of weeks.  2. Pulmonary emboli involving the right upper and right lower lobes  with positive CT chest angiogram on 06/17/2011 and negative  Dopplers.  3. Development of subdural hematoma involving the posterior fossa when  the patient was hospitalized at [facility], status post suboccipital craniotomy and evacuation  of hematoma on 07/04/2011.  4. History of hypertension.  5. Morbid obesity.  6. Arthritis involving the right hip.  7. History of palpitations.  8. History of migraine headaches.  9. Placement of inferior vena cava Greenfield filter on 07/07/2011.  10. Mild renal insufficiency  11. Right-sided double lumen Port-A-Cath placed at [facility] on 07/12/2011.  MEDICATIONS: 1. Acyclovir 800 mg twice daily. 2. Fioricet 1-2 tablets every 6 hours as needed. 3. Folic acid 1 mg daily. 4. EMLA cream apply as needed to the  Port-A-Cath. 5. Multivitamins 1 daily. 6. Zofran 8 mg every 8 hours as needed. 7. OxyIR 5 mg every 4 hours as needed. 8. Potassium chloride 20 mEq twice daily. 9. Compazine 10 mg every 6 hours as needed. 10.Bactrim DS 800/160 one tablet b.i.d. on Monday, Wednesdays, and     Fridays; however, the patient is currently not taking Bactrim at     this time. 11.Dasatinib.  Patient is currently not taking this medication.   SMOKING HISTORY:  The patient has never smoked cigarettes.    HISTORY:  Rilla Buckman was seen today for followup of her B-cell acute lymphoblastic leukemia.  It will be recalled that the Cristan's course initially was complicated by pulmonary emboli and a subdural hematoma.  Littie was last seen by [place] on 10/27/2011 and prior to that on 08/17/2011.  As noted above, she has recently undergone high-dose melphalan and autologous stem cell transplant.  These treatments were carried out during her admission at [facility] from 11/14/2011 through 11/28/2011.  At the present time Amorette is doing extremely well.  She has some nausea, but denies really any other symptomatology, specifically headache, shortness of breath, swelling of the legs, fever, chills, any signs of infection.  Her energy is good.  She denies any pain.  She is not on any specific treatment for her leukemia as her bone marrow is currently recovering.  Apparently, the Bactrim is also on hold.  PHYSICAL EXAMINATION:  General:  She looks well.  There has been some weight loss, approximately 15-20 pounds over the past few months. Weight today is 247.7 pounds, height 5 feet 8 inches, body surface area 2.32 sq m.  Vital Signs:  Blood pressure 150/88.  Other vital signs are normal.  She is afebrile.  O2 saturation on room air at rest was 99%. HEENT:  There is no scleral icterus.  Mouth and pharynx are benign. There is no peripheral adenopathy palpable.  Heart and lungs:  Normal. Breasts:  Not examined.  Port:  She  has a double-lumen right-sided Port- A-Cath that looks fine.  Abdomen:  Obese, otherwise unremarkable. Extremities:  Puffy, but no pitting edema.  Neurologic:  Normal.  Skin: No petechiae or purpura are evident.  LABORATORY DATA:  Today, white count 2.8, ANC 0.8, hemoglobin 11.0, hematocrit 32.5, platelets 135,000.  Chemistries today are essentially normal.  BUN was 9.0, creatinine 0.7, magnesium 1.9.  This was a BMET and not a CMET.  We do not have an LDH.  IMAGING STUDIES:  1. CT angiogram of the chest on 06/17/2011 showed right-sided  pulmonary emboli. There were mildly prominent mediastinal and  right hilar lymph nodes.  2. CT-guided iliac bone aspiration and core biopsy were carried out on  06/19/2011.  3. CT of the head without IV contrast on 07/23/2011 showed that the  patient was status post suboccipital craniotomy and right frontal  bur hole placement. Otherwise negative noncontrast CT appearance  of the brain. 4.Chest x-ray, 2 view, from 10/08/2011 was negative.   IMPRESSION AND PLAN:  [place] is doing quite well at the present time. As stated, she is not on any treatment now as she is recovering from her high-dose melphalan and autologous stem cell transplant carried out during her admission from 11/14/2011 through 11/28/2011.  We are being asked to check labs twice a week.  Vestal has an appointment to be seen at [facility] on November 15th.  I have suggested that she ask them about a flu shot.  She has not had a flu shot this season.  We will check a CBC and differential, BMET, and magnesium on Monday, November 11th.  If additional labs are not needed, either Larna or Marilynne Drivers will call us to arrange for additional labs.  I have canceled the appointment that was previously scheduled here on November 26th.  We will plan to see Amily again on February 08, 2012, at which time we will check CBC, chemistries, LDH, uric acid, and magnesium  level.  Janette understands about fever and infections.  She will call if there are any questions or problems.  We had information from [facility] which we have reviewed and incorporated into her record.    ______________________________ Samul Dada, [name] DSM/MEDQ  D:  12/07/2011  T:  12/08/2011  Job:  [id number]

## 2011-12-11 ENCOUNTER — Other Ambulatory Visit (HOSPITAL_BASED_OUTPATIENT_CLINIC_OR_DEPARTMENT_OTHER): Payer: Medicaid Other | Admitting: Lab

## 2011-12-11 ENCOUNTER — Encounter: Payer: Self-pay | Admitting: Oncology

## 2011-12-11 ENCOUNTER — Encounter: Payer: Self-pay | Admitting: Medical Oncology

## 2011-12-11 DIAGNOSIS — C9100 Acute lymphoblastic leukemia not having achieved remission: Secondary | ICD-10-CM

## 2011-12-11 LAB — CBC WITH DIFFERENTIAL/PLATELET
BASO%: 1.2 % (ref 0.0–2.0)
EOS%: 14 % — ABNORMAL HIGH (ref 0.0–7.0)
LYMPH%: 46.7 % (ref 14.0–49.7)
MCH: 29.1 pg (ref 25.1–34.0)
MCHC: 33.9 g/dL (ref 31.5–36.0)
MCV: 85.8 fL (ref 79.5–101.0)
MONO%: 20.7 % — ABNORMAL HIGH (ref 0.0–14.0)
Platelets: 166 10*3/uL (ref 145–400)
RBC: 3.88 10*6/uL (ref 3.70–5.45)

## 2011-12-11 LAB — MAGNESIUM (CC13): Magnesium: 1.8 mg/dl (ref 1.5–2.5)

## 2011-12-11 LAB — BASIC METABOLIC PANEL (CC13)
Calcium: 9.2 mg/dL (ref 8.4–10.4)
Glucose: 106 mg/dl — ABNORMAL HIGH (ref 70–99)
Sodium: 140 mEq/L (ref 136–145)

## 2011-12-11 NOTE — Progress Notes (Signed)
The following is a summary of the patient's oncological history:  The initial bone marrow aspirate and biopsy showed 49% blasts.  Repeat bone marrow aspirate and biopsy at [facility] carried out on 06/22/2011 showed Ph+ ALL with t(9;22), -7, del(9), t(3;9) and del(13;14).  The patient was enrolled on CALGB 16109 and started course 1 of treatment on 06/23/2011 with dasatinib 140 mg by mouth daily and dexamethasone by mouth daily for 7 days. The patient received only 6 days of dasatinib secondary to liver function test abnormalities.  Day 15 bone marrow biopsy on 07/08/2011 revealed a hypocellular marrow with no evidence of leukemia.  On 07/03/2011 the patient underwent a brain MRI because of persistent headaches. The MRI showed a posterior hemorrhage with subsequent development of hydrocephalus. The patient underwent an external ventricular drain placement on 07/04/2011. She then developed a subdural hematoma and underwent suboccipital craniotomy for decompression.  The external ventricular drain was removed on 07/07/2011 and an IVC filter was placed.  The patient started course 2A treatment on 07/12/2011 with dose reduced dasatinib and dexamethasone daily for 7 days.  Date 22 bone marrow biopsy showed hypercellular marrow (90%) with pan hyperplasia with no evidence of acute leukemia.  The patient underwent course 4 of treatment with high-dose methotrexate on 08/08/2011 and tolerated it well without any significant complications or side effects. LP was performed at bedside for IT chemotherapy and required several attempts. It was therefore planned to administer further IT chemotherapy under fluoroscopic guidance.  The patient received course 4, day 29 of chemotherapy with high-dose methotrexate and IT methotrexate under fluoroscopy beginning on 09/04/2011.  She underwent pre-stem cell transplant evaluation on 09/18/2011. Bone marrow biopsy showed no evidence of a LL. PCR BCR/ABL  showed 0.00001 fusion events. She's had a complete hematologic response.  On 09/26/2011, the patient was admitted to the inpatient bone marrow transplant service for mobilizing therapy with Etoposide and cytarabine in preparation to collect stem cells to proceed with autologous stem cell transplant.  On 11/14/2011, the patient received high-dose melphalan. She received her infusion of autologous stem cells on 11/16/2011.

## 2011-12-14 ENCOUNTER — Other Ambulatory Visit: Payer: Self-pay | Admitting: Medical Oncology

## 2011-12-14 ENCOUNTER — Telehealth: Payer: Self-pay | Admitting: Oncology

## 2011-12-14 DIAGNOSIS — C9100 Acute lymphoblastic leukemia not having achieved remission: Secondary | ICD-10-CM

## 2011-12-14 NOTE — Telephone Encounter (Signed)
S/w pt re appt for 11/19.

## 2011-12-18 ENCOUNTER — Telehealth: Payer: Self-pay | Admitting: *Deleted

## 2011-12-18 ENCOUNTER — Other Ambulatory Visit: Payer: Self-pay

## 2011-12-18 DIAGNOSIS — I2699 Other pulmonary embolism without acute cor pulmonale: Secondary | ICD-10-CM

## 2011-12-18 DIAGNOSIS — C9100 Acute lymphoblastic leukemia not having achieved remission: Secondary | ICD-10-CM

## 2011-12-18 NOTE — Telephone Encounter (Signed)
patient confirmed over the phone the new date and time 

## 2011-12-19 ENCOUNTER — Other Ambulatory Visit: Payer: Medicaid Other | Admitting: Lab

## 2011-12-20 ENCOUNTER — Other Ambulatory Visit: Payer: Medicaid Other | Admitting: Lab

## 2011-12-26 ENCOUNTER — Ambulatory Visit: Payer: 59 | Admitting: Oncology

## 2011-12-26 ENCOUNTER — Other Ambulatory Visit: Payer: 59 | Admitting: Lab

## 2011-12-27 ENCOUNTER — Ambulatory Visit (HOSPITAL_BASED_OUTPATIENT_CLINIC_OR_DEPARTMENT_OTHER): Payer: Medicaid Other

## 2011-12-27 ENCOUNTER — Other Ambulatory Visit (HOSPITAL_BASED_OUTPATIENT_CLINIC_OR_DEPARTMENT_OTHER): Payer: Medicaid Other | Admitting: Lab

## 2011-12-27 VITALS — BP 137/83 | HR 90 | Temp 98.4°F

## 2011-12-27 DIAGNOSIS — C9590 Leukemia, unspecified not having achieved remission: Secondary | ICD-10-CM

## 2011-12-27 DIAGNOSIS — Z452 Encounter for adjustment and management of vascular access device: Secondary | ICD-10-CM

## 2011-12-27 DIAGNOSIS — C9100 Acute lymphoblastic leukemia not having achieved remission: Secondary | ICD-10-CM

## 2011-12-27 LAB — CBC WITH DIFFERENTIAL/PLATELET
BASO%: 0.4 % (ref 0.0–2.0)
Basophils Absolute: 0 10*3/uL (ref 0.0–0.1)
EOS%: 5 % (ref 0.0–7.0)
MCH: 29.5 pg (ref 25.1–34.0)
MCHC: 33.8 g/dL (ref 31.5–36.0)
MCV: 87.2 fL (ref 79.5–101.0)
MONO%: 9.7 % (ref 0.0–14.0)
RBC: 3.76 10*6/uL (ref 3.70–5.45)
RDW: 14.7 % — ABNORMAL HIGH (ref 11.2–14.5)

## 2011-12-27 MED ORDER — HEPARIN SOD (PORK) LOCK FLUSH 100 UNIT/ML IV SOLN
500.0000 [IU] | Freq: Once | INTRAVENOUS | Status: AC
  Administered 2011-12-27: 500 [IU] via INTRAVENOUS
  Filled 2011-12-27: qty 5

## 2011-12-27 MED ORDER — SODIUM CHLORIDE 0.9 % IJ SOLN
10.0000 mL | INTRAMUSCULAR | Status: DC | PRN
  Administered 2011-12-27: 10 mL via INTRAVENOUS
  Filled 2011-12-27: qty 10

## 2011-12-27 NOTE — Patient Instructions (Signed)
Call MD for problems 

## 2011-12-27 NOTE — Progress Notes (Signed)
Right double lumen port :  Both lumens flushed.

## 2012-01-12 DIAGNOSIS — Z9484 Stem cells transplant status: Secondary | ICD-10-CM | POA: Insufficient documentation

## 2012-02-05 ENCOUNTER — Telehealth: Payer: Self-pay | Admitting: Medical Oncology

## 2012-02-05 ENCOUNTER — Other Ambulatory Visit: Payer: Self-pay | Admitting: Medical Oncology

## 2012-02-05 DIAGNOSIS — C9100 Acute lymphoblastic leukemia not having achieved remission: Secondary | ICD-10-CM

## 2012-02-05 NOTE — Telephone Encounter (Signed)
Pt called stating that has some shortness of breath. She states this started last Thurs. She has no fever today but has had low grade temp 99.8 with headache. She called [facility] and they told her to keep a check on her temp and if she gets worse to call. I asked if she has ever felt like this before and she states when her counts get low. Her last labs were 01/12/12. I spoke with Dr. Arline Asp and he would like for pt to come in tomorrow to get her labs, O2 sat and a chest x-ray. She will see him Thurs. She voiced understanding and pt will go to ER if she gets worse.POF made

## 2012-02-06 ENCOUNTER — Ambulatory Visit (HOSPITAL_BASED_OUTPATIENT_CLINIC_OR_DEPARTMENT_OTHER): Payer: Medicaid Other | Admitting: Lab

## 2012-02-06 ENCOUNTER — Encounter: Payer: Self-pay | Admitting: Medical Oncology

## 2012-02-06 ENCOUNTER — Other Ambulatory Visit: Payer: Self-pay | Admitting: Medical Oncology

## 2012-02-06 ENCOUNTER — Telehealth: Payer: Self-pay | Admitting: Oncology

## 2012-02-06 ENCOUNTER — Ambulatory Visit (HOSPITAL_COMMUNITY)
Admission: RE | Admit: 2012-02-06 | Discharge: 2012-02-06 | Disposition: A | Discharge status: Home / Self Care | Payer: Medicaid Other | Source: Ambulatory Visit | Attending: Oncology | Admitting: Oncology

## 2012-02-06 ENCOUNTER — Other Ambulatory Visit: Payer: Self-pay | Admitting: Oncology

## 2012-02-06 DIAGNOSIS — I2699 Other pulmonary embolism without acute cor pulmonale: Secondary | ICD-10-CM

## 2012-02-06 DIAGNOSIS — Z86711 Personal history of pulmonary embolism: Secondary | ICD-10-CM | POA: Insufficient documentation

## 2012-02-06 DIAGNOSIS — R509 Fever, unspecified: Secondary | ICD-10-CM | POA: Insufficient documentation

## 2012-02-06 DIAGNOSIS — C9100 Acute lymphoblastic leukemia not having achieved remission: Secondary | ICD-10-CM

## 2012-02-06 DIAGNOSIS — R0602 Shortness of breath: Secondary | ICD-10-CM | POA: Insufficient documentation

## 2012-02-06 DIAGNOSIS — Z79899 Other long term (current) drug therapy: Secondary | ICD-10-CM | POA: Insufficient documentation

## 2012-02-06 DIAGNOSIS — I10 Essential (primary) hypertension: Secondary | ICD-10-CM | POA: Insufficient documentation

## 2012-02-06 LAB — CBC WITH DIFFERENTIAL/PLATELET
Eosinophils Absolute: 0.2 10*3/uL (ref 0.0–0.5)
MCV: 89.9 fL (ref 79.5–101.0)
MONO%: 11.9 % (ref 0.0–14.0)
NEUT#: 1.3 10*3/uL — ABNORMAL LOW (ref 1.5–6.5)
RBC: 3.76 10*6/uL (ref 3.70–5.45)
RDW: 14.5 % (ref 11.2–14.5)
WBC: 3.4 10*3/uL — ABNORMAL LOW (ref 3.9–10.3)
lymph#: 1.5 10*3/uL (ref 0.9–3.3)

## 2012-02-06 LAB — COMPREHENSIVE METABOLIC PANEL (CC13)
ALT: 28 U/L (ref 0–55)
Albumin: 4 g/dL (ref 3.5–5.0)
CO2: 27 mEq/L (ref 22–29)
Calcium: 9.3 mg/dL (ref 8.4–10.4)
Chloride: 106 mEq/L (ref 98–107)
Potassium: 4.2 mEq/L (ref 3.5–5.1)
Sodium: 139 mEq/L (ref 136–145)
Total Protein: 7 g/dL (ref 6.4–8.3)

## 2012-02-06 LAB — LACTATE DEHYDROGENASE (CC13): LDH: 252 U/L — ABNORMAL HIGH (ref 125–245)

## 2012-02-06 MED ORDER — IOHEXOL 350 MG/ML SOLN
100.0000 mL | Freq: Once | INTRAVENOUS | Status: AC | PRN
  Administered 2012-02-06: 100 mL via INTRAVENOUS

## 2012-02-06 NOTE — Telephone Encounter (Signed)
Called pt and she is aware of her lab appt and to get her cxr today        anne

## 2012-02-06 NOTE — Progress Notes (Signed)
Pt came in today for labs,chest x-ray and vitals. Chest x-ray negative and labs reviewed by Dr. Arline Asp.Due to pt's history of PE and her shortness of breath since last week, Dr. Arline Asp would like to get a CT angio to rule out PE. CT scheduled and pt to radiology dept.

## 2012-02-08 ENCOUNTER — Ambulatory Visit (HOSPITAL_BASED_OUTPATIENT_CLINIC_OR_DEPARTMENT_OTHER): Payer: Medicaid Other | Admitting: Oncology

## 2012-02-08 ENCOUNTER — Telehealth: Payer: Self-pay | Admitting: Oncology

## 2012-02-08 ENCOUNTER — Other Ambulatory Visit: Payer: Self-pay | Admitting: Lab

## 2012-02-08 ENCOUNTER — Encounter: Payer: Self-pay | Admitting: Oncology

## 2012-02-08 VITALS — BP 140/89 | HR 84 | Temp 97.7°F | Resp 20 | Ht 68.0 in | Wt 253.0 lb

## 2012-02-08 DIAGNOSIS — I10 Essential (primary) hypertension: Secondary | ICD-10-CM

## 2012-02-08 DIAGNOSIS — C9100 Acute lymphoblastic leukemia not having achieved remission: Secondary | ICD-10-CM

## 2012-02-08 NOTE — Progress Notes (Signed)
This office note has been dictated.  #[id number]

## 2012-02-08 NOTE — Telephone Encounter (Signed)
Gave pt appt for March 2014 lab and ML

## 2012-02-08 NOTE — Telephone Encounter (Signed)
Gave pt appt for March 2014 lab and MD °

## 2012-02-09 ENCOUNTER — Other Ambulatory Visit: Payer: Self-pay

## 2012-02-09 ENCOUNTER — Telehealth: Payer: Self-pay

## 2012-02-09 DIAGNOSIS — C9100 Acute lymphoblastic leukemia not having achieved remission: Secondary | ICD-10-CM

## 2012-02-09 NOTE — Telephone Encounter (Signed)
Pt had called at 1344 asking about a lab appt that Dr Casimer Bilis wants her to have here. I followed up with [facility] and Kriste Basque stated she would send [place] the order. Called pt back to let her know it is in process and we will call with date and time.

## 2012-02-09 NOTE — Progress Notes (Signed)
CC:   [name], MD [name]. Lynn [name], [name] Lynn Gardener, MD  PROBLEM LIST:  1. B-cell acute lymphoblastic leukemia, Philadelphia chromosome  positive, t(9; 22) initially with 71% blasts seen on bone marrow  carried out on Jun 19, 2011. Subsequent bone marrow at [facility] on 06/22/2011 showed 98% blasts  and peripheral blood showed 71% blasts. The patient received  induction treatment with dasatinib (Sprycel) and Decadron. Her  hospital course was complicated by the development of a posterior  fossa subdural hematoma requiring a suboccipital craniotomy and  evacuation of the hematoma on 07/04/2011. The patient also had  neutropenic fever with negative cultures covered with broad-  spectrum antibiotics. She had some headaches, vaginal bleeding and  transaminitis. Admission to [facility] was from 06/21/2011 through 07/17/2011. Bone marrow carried  out on 07/06/2011 showed a hypocellular bone marrow with no  evidence for ALL.  The patient was enrolled on protocol CALGB 47829. That protocol  consisted of treatment with dasatinib along with chemotherapy. A bone  marrow carried out on 07/28/2011 showed pan hypoplasia with no evidence  for acute leukemia. Cytogenetics were normal. FISH studies were  negative for t(9; 22). PCR for small p190 in both the peripheral blood  and bone marrow returned negative at 0.000. On 08/08/2011 the patient  received high-dose methotrexate with leucovorin rescue. She also  received IV vincristine 2.0 mg and CNS prophylaxis with intrathecal  methotrexate/hydrocortisone. CSF was negative. On 09/18/2011 bone  marrow biopsy showed no evidence of ALL. PCR for BCR/ABL showed 0.00001  fusion events. It was felt that the patient had achieved a complete  hematologic response. On 09/26/2011 mobilization therapy with  cytarabine/etoposide was started  The patient was admitted to the hospital from  11/14/2011 through  11/28/2011, at which time she received high-dose therapy and her  autologous stem cell transplant. She received melphalan on days -2 and  days -1. Day 0 was 11/16/2011. The patient also received G-CSF. She  had severe mucositis and dysphagia on 11/20/2011 which required morphine  by PCA. She had some fevers on 11/23/2011. She was treated with  antibiotics for prophylaxis. She had some headaches, but those have  subsequently resolved. Venous Dopplers of her lower extremities were  done bilaterally because of some swelling of her legs. These were  negative for blood clots. It will be recalled that the patient does  have an inferior vena cava Greenfield filter that was placed on  07/07/2011 for her prior history of pulmonary emboli. As stated, the  patient was discharged from [facility] on October 29th. Lynn [name] tells me that she was restarted on dasatinib 100 mg daily in mid November.  Records from [facility] indicate that bone marrows carried out on 10/16/2011, 11/02/2011 and 12/15/2011 were negative for any signs of leukemia.   2. Pulmonary emboli involving the right upper and right lower lobes  with positive CT chest angiogram on 06/17/2011 and negative  Dopplers.  3. Development of subdural hematoma involving the posterior fossa when  the patient was hospitalized at [facility], status post suboccipital craniotomy and evacuation  of hematoma on 07/04/2011.  4. History of hypertension.  5. Morbid obesity.  6. Arthritis involving the right hip.  7. History of palpitations.  8. History of migraine headaches.  9. Placement of inferior vena cava Greenfield filter on 07/07/2011.  10. Mild renal  insufficiency  11. Right-sided double lumen Port-A-Cath placed at [facility] on 07/12/2011     MEDICATIONS:  Were reviewed and  recorded. Current Outpatient Prescriptions  Medication Sig Dispense Refill  . acyclovir (ZOVIRAX) 400 MG tablet Take 800 mg by mouth 2 (two) times daily.       . butalbital-acetaminophen-caffeine (FIORICET, ESGIC) 50-325-40 MG per tablet Take 1-2 tablets by mouth every 6 (six) hours as needed.      . dasatinib (SPRYCEL) 50 MG tablet Take 100 mg by mouth daily. Last dose 6/30 - may or may not resume      . folic acid (FOLVITE) 1 MG tablet Take 1 mg by mouth daily.      Lynn [name] lidocaine-prilocaine (EMLA) cream Apply 1 application topically as needed.      . Multiple Vitamin (MULTIVITAMIN) tablet Take 1 tablet by mouth daily.      . ondansetron (ZOFRAN) 8 MG tablet Take 8 mg by mouth every 8 (eight) hours as needed.      Lynn [name] oxycodone (OXY-IR) 5 MG capsule Take 5 mg by mouth every 4 (four) hours as needed.      Lynn [name] oxyCODONE-acetaminophen (PERCOCET/ROXICET) 5-325 MG per tablet Take 1 tablet by mouth every 4 (four) hours as needed.      . potassium chloride (MICRO-K) 10 MEQ CR capsule Take 20 mEq by mouth 2 (two) times daily.      . prochlorperazine (COMPAZINE) 10 MG tablet Take 10 mg by mouth every 6 (six) hours as needed.      . sulfamethoxazole-trimethoprim (BACTRIM DS) 800-160 MG per tablet Take 1 tablet by mouth See admin instructions. 1 tab BID on Mon, Wed and Fri      . prochlorperazine (COMPAZINE) 10 MG tablet Take 1 tablet (10 mg total) by mouth every 6 (six) hours as needed.  30 tablet  0  Dasatinib 100 mg daily restarted around 12/15/2011.    IMMUNIZATIONS:  Lynn [name] will be due to immunizations 6 months after high- dose therapy.  SMOKING HISTORY:  The patient has never smoked cigarettes.   HISTORY:  I saw Lynn [name] today for followup of her B cell acute lymphoblastic leukemia.  Lynn [name] was last seen by [place] on 12/07/2011.  In general she has been doing well up until about a week ago when she apparently had a mild infection with mild fever.  Since then she has noticed some intermittent  sense of breathlessness which can occur when she is lying down or when she is even sitting at rest.  She also has some feelings of being tired and dyspnea when she is ambulatory. Symptoms seem to be troublesome early in the week and for that reason we scheduled Lynn [name] for a chest x-ray and a CT angiogram of the chest carried out on 02/06/2012.  These were negative for any abnormalities, specifically pneumonitis or pulmonary emboli.  Lynn [name] also notes some racing and irregularity of her heart.  This occurs periodically.  She feels that her symptoms are much improved over the last day or 2.  She is without any other major symptomatology.  She has some occasional night sweats but no recent fever.  She has an appointment to be seen at [facility] tomorrow.  She apparently was last seen there on December 13.  PHYSICAL EXAMINATION:  There are no obvious changes.  Lynn [name] is in no acute distress.  Weight is 153 pounds.  Height 5 feet 8 inches.  Body surface area 2.35 sq m.  Blood pressure 140/89.  Other vital signs are normal.  O2 saturation on room at rest was 99%.  There is no scleral icterus.  Mouth and pharynx are benign.  There is no peripheral adenopathy palpable.  No axillary adenopathy.  Heart and lungs are normal.  Rhythm is regular.  Breasts are not examined.  She has a double lumen right-sided Port-A-Cath.  Abdomen is obese, otherwise negative. Extremities:  No peripheral edema.  Neurologic exam is normal.  No petechiae or purpura.  LABORATORY DATA:  Today, white count 3.4, ANC 1.3, hemoglobin 11.6, hematocrit 33.8, platelets 133,000.  Chemistries today were normal except for a minimal elevation of LDH at 252 with normal being 125-245. Magnesium was 2.1, uric acid 4.0.  IMAGING STUDIES:  1. CT angiogram of the chest on 06/17/2011 showed right-sided  pulmonary emboli. There were mildly prominent mediastinal and  right hilar lymph nodes.  2. CT-guided iliac bone aspiration and core  biopsy were carried out on  06/19/2011.  3. CT of the head without IV contrast on 07/23/2011 showed that the  patient was status post suboccipital craniotomy and right frontal  bur hole placement. Otherwise negative noncontrast CT appearance  of the brain.  4. Chest x-ray, 2 view, from 10/08/2011 was negative. 5. Chest x-ray, 2 view, on 02/06/2012 was negative. 6. CT angiogram of the chest on 02/06/2012 showed no evidence of pulmonary embolism or any other acute abnormalities.  Lungs were clear.    IMPRESSION AND PLAN:  Trissa seems to be doing well.  She will be seeing her doctors at [facility] tomorrow. Lynn [name] was given copies of her CT and x-ray reports.  I urged Maybel to discuss her symptoms with her doctors on tomorrow's visit.  She may need a 2D echocardiogram to assess cardiac function.  It will be recalled that Lynn [name] did have pulmonary emboli at the time of diagnosis in May 2013 and required the placement of an inferior vena cava Greenfield filter on 07/07/2011, that was carried out at [facility], because Lynn [name] developed subdural hematoma when she was on anticoagulation therapy.  Lynn [name] will contact us if she needs to have interval labs.  In the meantime, we will plan to see her again in 2 months at which time we will check CBC, chemistries and magnesium.  She can see Lynn [name] at that time.  It will be recalled that Lynn [name] is being treated according to protocol CALGB 16109.    ______________________________ Samul Dada, [name] DSM/MEDQ  D:  02/08/2012  T:  02/09/2012  Job:  [id number]

## 2012-02-12 ENCOUNTER — Telehealth: Payer: Self-pay | Admitting: Oncology

## 2012-02-12 NOTE — Telephone Encounter (Signed)
lmonvm for pt re appt for 1/24 and mailed schedule for January/March.

## 2012-02-15 ENCOUNTER — Telehealth: Payer: Self-pay | Admitting: Medical Oncology

## 2012-02-15 NOTE — Telephone Encounter (Signed)
[employer] called to set up peer to peer review for denied payment of CT angio 02/07/2012. Dr. Arline Asp spoke with Dr. Robin Searing who approved the CT and discuss of pt's case. Pt was notified and Lilyan Punt in care management notified.

## 2012-02-23 ENCOUNTER — Other Ambulatory Visit (HOSPITAL_BASED_OUTPATIENT_CLINIC_OR_DEPARTMENT_OTHER): Payer: Medicaid Other | Admitting: Lab

## 2012-02-23 DIAGNOSIS — C9100 Acute lymphoblastic leukemia not having achieved remission: Secondary | ICD-10-CM

## 2012-02-23 LAB — CBC WITH DIFFERENTIAL/PLATELET
BASO%: 0.6 % (ref 0.0–2.0)
Basophils Absolute: 0 10*3/uL (ref 0.0–0.1)
EOS%: 6.1 % (ref 0.0–7.0)
HCT: 32.9 % — ABNORMAL LOW (ref 34.8–46.6)
MCH: 29.9 pg (ref 25.1–34.0)
MCHC: 34.3 g/dL (ref 31.5–36.0)
MCV: 87 fL (ref 79.5–101.0)
MONO%: 11 % (ref 0.0–14.0)
NEUT%: 32 % — ABNORMAL LOW (ref 38.4–76.8)
RDW: 14.5 % (ref 11.2–14.5)
lymph#: 1.6 10*3/uL (ref 0.9–3.3)

## 2012-03-16 ENCOUNTER — Other Ambulatory Visit: Payer: Self-pay

## 2012-03-18 ENCOUNTER — Emergency Department (HOSPITAL_COMMUNITY): Payer: Medicaid Other

## 2012-03-18 ENCOUNTER — Emergency Department (HOSPITAL_COMMUNITY)
Admission: EM | Admit: 2012-03-18 | Discharge: 2012-03-18 | Disposition: A | Discharge status: Home Health | Payer: Medicaid Other | Attending: Emergency Medicine | Admitting: Emergency Medicine

## 2012-03-18 ENCOUNTER — Encounter (HOSPITAL_COMMUNITY): Payer: Self-pay | Admitting: *Deleted

## 2012-03-18 DIAGNOSIS — Z79899 Other long term (current) drug therapy: Secondary | ICD-10-CM | POA: Insufficient documentation

## 2012-03-18 DIAGNOSIS — C9100 Acute lymphoblastic leukemia not having achieved remission: Secondary | ICD-10-CM | POA: Insufficient documentation

## 2012-03-18 DIAGNOSIS — R059 Cough, unspecified: Secondary | ICD-10-CM | POA: Insufficient documentation

## 2012-03-18 DIAGNOSIS — R51 Headache: Secondary | ICD-10-CM | POA: Insufficient documentation

## 2012-03-18 DIAGNOSIS — I10 Essential (primary) hypertension: Secondary | ICD-10-CM | POA: Insufficient documentation

## 2012-03-18 DIAGNOSIS — R05 Cough: Secondary | ICD-10-CM

## 2012-03-18 DIAGNOSIS — Z9481 Bone marrow transplant status: Secondary | ICD-10-CM | POA: Insufficient documentation

## 2012-03-18 MED ORDER — METOCLOPRAMIDE HCL 5 MG/ML IJ SOLN
10.0000 mg | INTRAMUSCULAR | Status: AC
  Administered 2012-03-18: 10 mg via INTRAVENOUS
  Filled 2012-03-18: qty 2

## 2012-03-18 MED ORDER — KETOROLAC TROMETHAMINE 30 MG/ML IJ SOLN
30.0000 mg | Freq: Once | INTRAMUSCULAR | Status: AC
  Administered 2012-03-18: 30 mg via INTRAVENOUS
  Filled 2012-03-18: qty 1

## 2012-03-18 MED ORDER — DIPHENHYDRAMINE HCL 50 MG/ML IJ SOLN
12.5000 mg | Freq: Once | INTRAMUSCULAR | Status: AC
  Administered 2012-03-18: 12.5 mg via INTRAVENOUS
  Filled 2012-03-18: qty 1

## 2012-03-18 MED ORDER — DIAZEPAM 5 MG/ML IJ SOLN
5.0000 mg | Freq: Once | INTRAMUSCULAR | Status: AC
  Administered 2012-03-18: 5 mg via INTRAVENOUS
  Filled 2012-03-18: qty 2

## 2012-03-18 NOTE — ED Notes (Signed)
Patient states headache since last Friday and pain is worsening today

## 2012-03-18 NOTE — ED Provider Notes (Signed)
History     CSN: [id number]  Arrival date & time 03/18/12  1004   First MD Initiated Contact with Patient 03/18/12 1026      Chief Complaint  Patient presents with  . Headache    (Consider location/radiation/quality/duration/timing/severity/associated sxs/prior treatment) Patient is a 47 y.o. female presenting with headaches. The history is provided by the patient. No language interpreter was used.  Headache Pain location:  Occipital and frontal Quality:  Sharp Radiates to:  Does not radiate Severity currently:  7/10 Severity at highest:  7/10 Onset quality:  Gradual Duration:  4 days Timing:  Intermittent Progression:  Waxing and waning Chronicity:  Recurrent Similar to prior headaches: yes   Context: not activity, not exposure to bright light, not caffeine, not coughing, not stress, not loud noise and not straining   Relieved by:  Prescription medications Worsened by:  Activity Ineffective treatments:  NSAIDs, prescription medications and resting in a darkened room Associated symptoms: cough (pt has had a nonproductive cought for about a week.  It ocurring prior to the HA)   Associated symptoms: no abdominal pain, no back pain, no blurred vision, no diarrhea, no drainage, no pain, no fatigue, no fever, no nausea, no near-syncope, no neck pain, no neck stiffness, no paresthesias, no photophobia, no sinus pressure, no sore throat, no syncope, no URI, no vomiting and no weakness   Risk factors: does not have insomnia     Past Medical History  Diagnosis Date  . Hypertension   . ALL (acute lymphoblastic leukemia)   . Leukemia     Past Surgical History  Procedure Laterality Date  . Cesarean section    . Foot surgery    . Foot surgery    . Cesarean section    . Eye surgery    . Craniotomy      Family History  Problem Relation Age of Onset  . Cancer Father   . Cancer Mother     colon    History  Substance Use Topics  . Smoking status: Never Smoker   .  Smokeless tobacco: Never Used  . Alcohol Use: No    OB History   Grav Para Term Preterm Abortions TAB SAB Ect Mult Living                  Review of Systems  Constitutional: Negative for fever and fatigue.  HENT: Negative for sore throat, neck pain, neck stiffness, postnasal drip and sinus pressure.   Eyes: Negative for blurred vision, photophobia and pain.  Respiratory: Positive for cough (pt has had a nonproductive cought for about a week.  It ocurring prior to the HA).   Cardiovascular: Negative for syncope and near-syncope.  Gastrointestinal: Negative for nausea, vomiting, abdominal pain and diarrhea.  Musculoskeletal: Negative for back pain.  Neurological: Positive for headaches. Negative for paresthesias.  All other systems reviewed and are negative.    Allergies  Review of patient's allergies indicates no known allergies.  Home Medications   Current Outpatient Rx  Name  Route  Sig  Dispense  Refill  . acyclovir (ZOVIRAX) 400 MG tablet   Oral   Take 800 mg by mouth 2 (two) times daily.          . butalbital-acetaminophen-caffeine (FIORICET, ESGIC) 50-325-40 MG per tablet   Oral   Take 1-2 tablets by mouth every 6 (six) hours as needed.         . dasatinib (SPRYCEL) 50 MG tablet   Oral   Take  100 mg by mouth daily. Last dose 6/30 - may or may not resume         . folic acid (FOLVITE) 1 MG tablet   Oral   Take 1 mg by mouth daily.         Marland Kitchen lidocaine-prilocaine (EMLA) cream   Topical   Apply 1 application topically as needed.         . Magnesium 250 MG TABS   Oral   Take 1 tablet by mouth daily.         . Multiple Vitamin (MULTIVITAMIN) tablet   Oral   Take 1 tablet by mouth daily.         . ondansetron (ZOFRAN) 8 MG tablet   Oral   Take 8 mg by mouth every 8 (eight) hours as needed for nausea.          Marland Kitchen oxycodone (OXY-IR) 5 MG capsule   Oral   Take 5 mg by mouth every 4 (four) hours as needed for pain.          . potassium  chloride (MICRO-K) 10 MEQ CR capsule   Oral   Take 20 mEq by mouth 2 (two) times daily.         Marland Kitchen sulfamethoxazole-trimethoprim (BACTRIM DS) 800-160 MG per tablet   Oral   Take 1 tablet by mouth See admin instructions. 1 tab BID on Mon, Wed and Fri         . SUMAtriptan (IMITREX) 50 MG tablet   Oral   Take 50 mg by mouth every 6 (six) hours as needed for migraine.           BP 133/69  Pulse 91  Temp(Src) 99.4 F (37.4 C) (Oral)  Resp 16  Ht 5\' 6"  (1.676 m)  Wt 245 lb (111.131 kg)  BMI 39.56 kg/m2  SpO2 100%  Physical Exam  Nursing note and vitals reviewed. Constitutional: She is oriented to person, place, and time. She appears well-developed and well-nourished. No distress.  HENT:  Head: Normocephalic and atraumatic. Head is without raccoon's eyes, without Battle's sign, without abrasion, without contusion, without right periorbital erythema and without left periorbital erythema. No trismus in the jaw.    Right Ear: Tympanic membrane, external ear and ear canal normal. No mastoid tenderness. No hemotympanum.  Left Ear: Tympanic membrane, external ear and ear canal normal. No mastoid tenderness. No hemotympanum.  Nose: Nose normal. No rhinorrhea or nasal septal hematoma. No epistaxis. Right sinus exhibits no maxillary sinus tenderness and no frontal sinus tenderness. Left sinus exhibits no maxillary sinus tenderness and no frontal sinus tenderness.  Mouth/Throat: Uvula is midline. Mucous membranes are not pale, not dry and not cyanotic. No edematous. Tonsillar abscesses present. No oropharyngeal exudate.  Eyes: Conjunctivae, EOM and lids are normal. Pupils are equal, round, and reactive to light. Right eye exhibits no discharge and no exudate. Left eye exhibits no discharge and no exudate. Right conjunctiva is not injected. Right conjunctiva has no hemorrhage. Left conjunctiva is not injected. Left conjunctiva has no hemorrhage. No scleral icterus. Right eye exhibits normal  extraocular motion and no nystagmus. Left eye exhibits normal extraocular motion and no nystagmus. Right pupil is round and reactive. Left pupil is round and reactive. Pupils are equal.  Neck: Trachea normal, normal range of motion, full passive range of motion without pain and phonation normal. Neck supple. No JVD present. No tracheal tenderness, no spinous process tenderness and no muscular tenderness present. Carotid bruit is not  present. No rigidity. No tracheal deviation, no edema, no erythema and normal range of motion present.  Cardiovascular: Normal rate, regular rhythm, normal heart sounds and intact distal pulses.  Exam reveals no gallop and no friction rub.   No murmur heard. Pulmonary/Chest: Effort normal and breath sounds normal. No stridor. No respiratory distress. She has no wheezes. She has no rales. She exhibits no tenderness.  Abdominal: Soft. Bowel sounds are normal. She exhibits no distension and no mass. There is no tenderness. There is no rebound and no guarding.  Musculoskeletal: Normal range of motion. She exhibits no edema and no tenderness.  Neurological: She is alert and oriented to person, place, and time. No cranial nerve deficit. She exhibits normal muscle tone.  Skin: Skin is warm and dry. No rash noted. She is not diaphoretic. No erythema. No pallor.  Psychiatric: She has a normal mood and affect. Her behavior is normal.    ED Course  Procedures (including critical care time)  Labs Reviewed - No data to display No results found.   No diagnosis found.    MDM  Pt, with a hx of ICH (while on anticoagulation) and ALL s/p bone marrow transplan 10/13, presents for evaluation of a persistent HA.  She appears nontoxic, afebrile, note stable VS, NAD.  She has no clinical evidence of CVA or meningitis and she does have a hx of chronic headaches.  She has taken the imitrex, tylenol, and fioricet she had been previously prescribed with only transient relief of the  symptoms.  Will obtain a CT scan of the head and administer valium and reglan.  Also ordered a CXR as she reports having a nonproductive cough for the last week. 1220.  HA is improved but not resolved.  Imaging of head demonstrates post surgical and chronic changes without evidence of acute p[athology.  The CXR is clear. 1410.  HA is resolved.  Pt appears nontoxic.  Plan d/c home.      Tobin Chad, MD 03/18/12 [phone]

## 2012-03-18 NOTE — ED Notes (Signed)
Pt states that her HA is a 2/10 no distress noted.  Resp symmetrical and unlabored.

## 2012-04-04 ENCOUNTER — Telehealth: Payer: Self-pay | Admitting: Oncology

## 2012-04-04 NOTE — Telephone Encounter (Signed)
lm appt d/t given..made pt aware that letter/cal will be mailed...td

## 2012-04-08 ENCOUNTER — Other Ambulatory Visit: Payer: Medicaid Other | Admitting: Lab

## 2012-04-08 ENCOUNTER — Ambulatory Visit: Payer: Medicaid Other | Admitting: Family

## 2012-04-10 ENCOUNTER — Other Ambulatory Visit (HOSPITAL_BASED_OUTPATIENT_CLINIC_OR_DEPARTMENT_OTHER): Payer: Medicaid Other

## 2012-04-10 ENCOUNTER — Ambulatory Visit (HOSPITAL_BASED_OUTPATIENT_CLINIC_OR_DEPARTMENT_OTHER): Payer: Medicaid Other | Admitting: Physician Assistant

## 2012-04-10 ENCOUNTER — Telehealth: Payer: Self-pay | Admitting: Oncology

## 2012-04-10 VITALS — BP 116/67 | HR 80 | Temp 98.0°F | Resp 20 | Ht 66.0 in | Wt 245.2 lb

## 2012-04-10 DIAGNOSIS — R718 Other abnormality of red blood cells: Secondary | ICD-10-CM

## 2012-04-10 DIAGNOSIS — I2699 Other pulmonary embolism without acute cor pulmonale: Secondary | ICD-10-CM

## 2012-04-10 DIAGNOSIS — R590 Localized enlarged lymph nodes: Secondary | ICD-10-CM

## 2012-04-10 DIAGNOSIS — D649 Anemia, unspecified: Secondary | ICD-10-CM

## 2012-04-10 DIAGNOSIS — D72829 Elevated white blood cell count, unspecified: Secondary | ICD-10-CM

## 2012-04-10 DIAGNOSIS — C9100 Acute lymphoblastic leukemia not having achieved remission: Secondary | ICD-10-CM

## 2012-04-10 DIAGNOSIS — D696 Thrombocytopenia, unspecified: Secondary | ICD-10-CM

## 2012-04-10 LAB — COMPREHENSIVE METABOLIC PANEL (CC13)
Albumin: 3.8 g/dL (ref 3.5–5.0)
Alkaline Phosphatase: 97 U/L (ref 40–150)
BUN: 9.7 mg/dL (ref 7.0–26.0)
Glucose: 90 mg/dl (ref 70–99)
Potassium: 3.9 mEq/L (ref 3.5–5.1)
Total Bilirubin: 0.22 mg/dL (ref 0.20–1.20)

## 2012-04-10 LAB — CBC WITH DIFFERENTIAL/PLATELET
Basophils Absolute: 0 10*3/uL (ref 0.0–0.1)
EOS%: 5.8 % (ref 0.0–7.0)
HCT: 32 % — ABNORMAL LOW (ref 34.8–46.6)
HGB: 11.1 g/dL — ABNORMAL LOW (ref 11.6–15.9)
MCH: 30.5 pg (ref 25.1–34.0)
MCV: 88 fL (ref 79.5–101.0)
MONO%: 12.6 % (ref 0.0–14.0)
NEUT%: 26.8 % — ABNORMAL LOW (ref 38.4–76.8)

## 2012-04-10 LAB — MAGNESIUM (CC13): Magnesium: 2.2 mg/dl (ref 1.5–2.5)

## 2012-04-10 NOTE — Progress Notes (Signed)
[facility]  [phone]   CC: [name], MD  Lynn [name]. Lynn [name], [name]  Lynn Gardener, MD   OFFICE PROGRESS NOTE  PROBLEM LIST:  1. B-cell acute lymphoblastic leukemia, Philadelphia chromosome positive, t(9; 22) initially with 71% blasts seen on bone marrow carried out on Jun 19, 2011. Subsequent bone marrow at [facility] on 06/22/2011 showed 98% blasts and peripheral blood showed 71% blasts. The patient received induction treatment with dasatinib (Sprycel) and Decadron. Her hospital course was complicated by the development of a posterior fossa subdural hematoma requiring a suboccipital craniotomy and evacuation of the hematoma on 07/04/2011. The patient also had neutropenic fever with negative cultures covered with broad- spectrum antibiotics. She had some headaches, vaginal bleeding and transaminitis. Admission to [facility] was from 06/21/2011 through 07/17/2011. Bone marrow carried out on 07/06/2011 showed a hypocellular bone marrow with no evidence for ALL. The patient was enrolled on protocol CALGB 56213. That protocol consisted of treatment with dasatinib along with chemotherapy. A bone marrow carried out on 07/28/2011 showed pan hypoplasia with no evidence for acute leukemia. Cytogenetics were normal. FISH studies were negative for t(9; 22). PCR for small p190 in both the peripheral blood and bone marrow returned negative at 0.000. On 08/08/2011 the patient received high-dose methotrexate with leucovorin rescue. She also received IV vincristine 2.0 mg and CNS prophylaxis with intrathecal methotrexate/hydrocortisone. CSF was negative. On 09/18/2011 bone marrow biopsy showed no evidence of ALL. PCR for BCR/ABL showed 0.00001 fusion events. It was felt that the patient had achieved a complete hematologic response. On 09/26/2011 mobilization therapy with cytarabine/etoposide was started The patient was  admitted to the hospital from 11/14/2011 through 11/28/2011, at which time she received high-dose therapy and her autologous stem cell transplant. She received melphalan on days -2 and  days -1. Day 0 was 11/16/2011. The patient also received G-CSF. She had severe mucositis and dysphagia on 11/20/2011 which required morphine by PCA. She had some fevers on 11/23/2011. She was treated with antibiotics for prophylaxis. She had some headaches, but those have subsequently resolved. Venous Dopplers of her lower extremities were done bilaterally because of some swelling of her legs. These were negative for blood clots. It will be recalled that the patient does have an inferior vena cava Greenfield filter that was placed on 07/07/2011 for her prior history of pulmonary emboli. As stated, the patient was discharged from [facility] on October 29th. Lynn [name] tells me that she was restarted on dasatinib 100 mg daily in mid November. Records from [facility] indicate that bone marrows carried out on 10/16/2011, 11/02/2011 and 12/15/2011 were negative for any signs of leukemia.Bone marrow biopsy at [facility] 03/08/12 results are pending.  2. Pulmonary emboli involving the right upper and right lower lobes with positive CT chest angiogram on 06/17/2011 and negative Dopplers.  3. Development of subdural hematoma involving the posterior fossa when the patient was hospitalized at [facility], status post suboccipital craniotomy and evacuation of hematoma on 07/04/2011. 4. History of hypertension.  5. Morbid obesity.  6. Arthritis involving the right hip.  7. History of palpitations.  8. History of migraine headaches.  9. Placement of inferior vena cava Greenfield filter on 07/07/2011.  10. Mild renal insufficiency  11. Right-sided double lumen Port-A-Cath placed at [facility] on 07/12/2011  12.  Headaches since 12/2011, being evaluated by  Neurology.     MEDICATIONS:  Current Outpatient Prescriptions  Medication Sig Dispense Refill  . acyclovir (ZOVIRAX) 400 MG tablet Take 800 mg by mouth 2 (two) times daily.       . butalbital-acetaminophen-caffeine (FIORICET, ESGIC) 50-325-40 MG per tablet Take 1-2 tablets by mouth every 6 (six) hours as needed.      . dasatinib (SPRYCEL) 50 MG tablet Take 100 mg by mouth daily. Last dose 6/30 - may or may not resume      . folic acid (FOLVITE) 1 MG tablet Take 1 mg by mouth daily.      Marland Kitchen lidocaine-prilocaine (EMLA) cream Apply 1 application topically as needed.      . Magnesium 250 MG TABS Take 1 tablet by mouth daily.      . Multiple Vitamin (MULTIVITAMIN) tablet Take 1 tablet by mouth daily.      . ondansetron (ZOFRAN) 8 MG tablet Take 8 mg by mouth every 8 (eight) hours as needed for nausea.       Marland Kitchen oxycodone (OXY-IR) 5 MG capsule Take 5 mg by mouth every 4 (four) hours as needed for pain.       . potassium chloride (MICRO-K) 10 MEQ CR capsule Take 20 mEq by mouth 2 (two) times daily.      Marland Kitchen sulfamethoxazole-trimethoprim (BACTRIM DS) 800-160 MG per tablet Take 1 tablet by mouth See admin instructions. 1 tab BID on Mon, Wed and Fri      . SUMAtriptan (IMITREX) 50 MG tablet Take 50 mg by mouth every 6 (six) hours as needed for migraine.      . topiramate (TOPAMAX) 50 MG tablet Take 50 mg by mouth 2 (two) times daily.      . traMADol (ULTRAM) 50 MG tablet Take 50 mg by mouth every 8 (eight) hours as needed for pain.       No current facility-administered medications for this visit.   IMMUNIZATIONS: Last flu shot was on Feb 09 2012.  SMOKING HISTORY: The patient has never smoked cigarettes.    HISTORY:Lynn [name] returns  today for followup of her B cell acute lymphoblastic leukemia. Lynn [name] was last seen by [place] on 02/08/2012.At the time, she was experiencing headaches which promted a CT of the Head without contrast on 03/18/12, with negative  findings. She has been seen by Neurology, prescribed with Imitrex and Tramadol with some relief of her symptoms. However, these are present intermittently, at least 4 times a week, debilitating her functional status, accompanied by aura  And dizziiness lasting at times hours. She has a follow up appointment with her neurologist to further investigate this issue.She is to have an MRI of the brain at the time.  Since lase evaluated by [place], she has been seen at [facility], Dr. Kerri Perches, records are pending. She reports that a bone marrow biopsy was performed on 2/7, which showed no acute abnormalities. Will review results when available. She denies any fever, chills, occasional night sweats. She has experienced decreased appetite since her headaches. No respiratory or cardiac complaints. No GI or GU issues. HSe is up to date with vaccinations, last flu shot was on 1/10.    ALLERGIES:  No Known Allergies   PHYSICAL EXAMINATION:   Filed Vitals:   04/10/12 0901  BP: 116/67  Pulse: 80  Temp: 98 F (36.7 C)  Resp: 20   Filed Weights   04/10/12 0901  Weight: 245 lb 3.2 oz (111.222 kg)    PHYSICAL EXAMINATION: There are no obvious  changes. Tamera is in no acute distress.There is no scleral icterus. Mouth and pharynx are benign. There is no peripheral adenopathy palpable. No axillary adenopathy. Heart and lungs are normal. Rhythm is regular. Breasts are not examined. She has a double lumen right-sided Port-A-Cath. Abdomen is obese, otherwise negative. Extremities: No peripheral edema. Neurologic exam is normal. No petechiae or purpura.   LABORATORY/RADIOLOGY DATA:   Recent Labs Lab 04/10/12 0851  WBC 4.6  HGB 11.1*  HCT 32.0*  PLT 114*  MCV 88.0  MCH 30.5  MCHC 34.6  RDW 14.1  LYMPHSABS 2.5  MONOABS 0.6  EOSABS 0.3  BASOSABS 0.0    CMP   No results found for this basename: NA, K, CL, CO2, GLUCOSE, BUN, CREATININE, GFRCGP, CALCIUM, MG, AST, ALT, ALKPHOS, BILITOT,  in the last 168  hours   On 02/08/12, LDH  Was at 252 with normal being 125-245.New chemistries pending. Last uric acid on 1/9 was 4.0   Radiology Studies:   1. CT angiogram of the chest on 06/17/2011 showed right-sided pulmonary emboli. There were mildly prominent mediastinal and right hilar lymph nodes.  2. CT-guided iliac bone aspiration and core biopsy were carried out on 06/19/2011.  3. CT of the head without IV contrast on 07/23/2011 showed that the patient was status post suboccipital craniotomy and right frontal  bur hole placement. Otherwise negative noncontrast CT appearance of the brain.  4. Chest x-ray, 2 view, from 10/08/2011 was negative.  5. Chest x-ray, 2 view, on 02/06/2012 was negative.  6. CT angiogram of the chest on 02/06/2012 showed no evidence of pulmonary embolism or any other acute abnormalities. Lungs were clear.  7.CT of the head without contrast on 03/18/12 with no acute abnormalities No evidence of intracranial hemorrhage. Previous suboccipital craniotomy and right frontal burr hole 8.CXR 03/18/12 No acute cardiopulmonary disease 9. Bone marrow biopsy at [facility] 03/08/12.  ASSESSMENT AND PLAN:   Tareva seems to be doing well. She will be seeing her doctors at [facility] tomorrow. She continues to be on Dasatinib 125 mg daily.  Shailene will contact us if she needs to have interval labs. In the meantime, we will plan to see her again in 3 months at which time we will check CBC, chemistries, LDH.  It will be recalled that Danine is being treated according to protocol CALGB 16109.She has been instructed to request any pertinent hematological and neurological  record to be mailed to [place] for further evaluation.  Dr. Arline Asp has seen the patient and formulated the plan.   Lynn Kays E, [name]-C 04/10/2012, 12:20 PM

## 2012-04-10 NOTE — Patient Instructions (Addendum)
Follow up in 3 months with labs with Dr. Arline Asp

## 2012-04-14 ENCOUNTER — Encounter: Payer: Self-pay | Admitting: Oncology

## 2012-04-14 NOTE — Progress Notes (Signed)
We received office progress notes dated 02/09/2012, 03/08/2012 and 04/09/2012 from [facility].  We also received a report on a bone marrow carried out on 03/08/2012. Bone marrow was normal with an overall cellularity of 40%. There was no increase in blasts. PCR was 0.000.  It will be recalled that patient had an autologous stem cell transplant on 11/16/2011. As of 04/09/2012, patient is Day +140.  The main problem noted was severe headaches associated with nausea and vomiting. Headaches were increasing in frequency and severity. The patient is supposed to see a neurologist locally. I was wondering whether the patient needs to have a lumbar puncture to rule out leukemic meningitis.  Significant medicines are as follows: -Dasatinib 100 mg daily. -Acyclovir 800 mg twice daily. -Sulfamethoxazole-trimethoprim (Bactrim DS) 800-160 mg twice daily on Monday Wednesday and Friday.

## 2012-04-17 ENCOUNTER — Other Ambulatory Visit: Payer: Self-pay | Admitting: Neurology

## 2012-04-17 DIAGNOSIS — R51 Headache: Secondary | ICD-10-CM

## 2012-04-18 ENCOUNTER — Other Ambulatory Visit: Payer: Self-pay

## 2012-04-18 ENCOUNTER — Telehealth: Payer: Self-pay

## 2012-04-18 DIAGNOSIS — C9100 Acute lymphoblastic leukemia not having achieved remission: Secondary | ICD-10-CM

## 2012-04-18 NOTE — Telephone Encounter (Signed)
S/w pt to expect a call from scheduler today or tomorrow for lab appt on Monday.

## 2012-04-21 ENCOUNTER — Ambulatory Visit
Admission: RE | Admit: 2012-04-21 | Discharge: 2012-04-21 | Disposition: A | Discharge status: Home / Self Care | Payer: Medicaid Other | Source: Ambulatory Visit | Attending: Neurology | Admitting: Neurology

## 2012-04-21 DIAGNOSIS — R51 Headache: Secondary | ICD-10-CM

## 2012-04-21 MED ORDER — GADOBENATE DIMEGLUMINE 529 MG/ML IV SOLN
20.0000 mL | Freq: Once | INTRAVENOUS | Status: AC | PRN
  Administered 2012-04-21: 20 mL via INTRAVENOUS

## 2012-04-22 ENCOUNTER — Other Ambulatory Visit (HOSPITAL_BASED_OUTPATIENT_CLINIC_OR_DEPARTMENT_OTHER): Payer: Medicaid Other

## 2012-04-22 DIAGNOSIS — C9100 Acute lymphoblastic leukemia not having achieved remission: Secondary | ICD-10-CM

## 2012-04-22 LAB — CBC WITH DIFFERENTIAL/PLATELET
Eosinophils Absolute: 0.3 10*3/uL (ref 0.0–0.5)
LYMPH%: 45.3 % (ref 14.0–49.7)
MONO#: 0.4 10*3/uL (ref 0.1–0.9)
NEUT#: 1 10*3/uL — ABNORMAL LOW (ref 1.5–6.5)
Platelets: 125 10*3/uL — ABNORMAL LOW (ref 145–400)
RBC: 3.69 10*6/uL — ABNORMAL LOW (ref 3.70–5.45)
WBC: 3.2 10*3/uL — ABNORMAL LOW (ref 3.9–10.3)
lymph#: 1.4 10*3/uL (ref 0.9–3.3)

## 2012-04-22 LAB — COMPREHENSIVE METABOLIC PANEL (CC13)
ALT: 37 U/L (ref 0–55)
Albumin: 3.7 g/dL (ref 3.5–5.0)
CO2: 26 mEq/L (ref 22–29)
Calcium: 9.1 mg/dL (ref 8.4–10.4)
Chloride: 108 mEq/L — ABNORMAL HIGH (ref 98–107)
Glucose: 123 mg/dl — ABNORMAL HIGH (ref 70–99)
Potassium: 3.9 mEq/L (ref 3.5–5.1)
Sodium: 143 mEq/L (ref 136–145)
Total Protein: 6.5 g/dL (ref 6.4–8.3)

## 2012-04-24 ENCOUNTER — Emergency Department (HOSPITAL_COMMUNITY): Payer: Medicaid Other

## 2012-04-24 ENCOUNTER — Encounter (HOSPITAL_COMMUNITY): Payer: Self-pay | Admitting: *Deleted

## 2012-04-24 ENCOUNTER — Emergency Department (HOSPITAL_COMMUNITY)
Admission: EM | Admit: 2012-04-24 | Discharge: 2012-04-25 | Disposition: A | Discharge status: Home / Self Care | Payer: Medicaid Other | Attending: Emergency Medicine | Admitting: Emergency Medicine

## 2012-04-24 DIAGNOSIS — Z79899 Other long term (current) drug therapy: Secondary | ICD-10-CM | POA: Insufficient documentation

## 2012-04-24 DIAGNOSIS — Z86718 Personal history of other venous thrombosis and embolism: Secondary | ICD-10-CM | POA: Insufficient documentation

## 2012-04-24 DIAGNOSIS — M791 Myalgia, unspecified site: Secondary | ICD-10-CM

## 2012-04-24 DIAGNOSIS — R51 Headache: Secondary | ICD-10-CM | POA: Insufficient documentation

## 2012-04-24 DIAGNOSIS — Z8669 Personal history of other diseases of the nervous system and sense organs: Secondary | ICD-10-CM | POA: Insufficient documentation

## 2012-04-24 DIAGNOSIS — IMO0001 Reserved for inherently not codable concepts without codable children: Secondary | ICD-10-CM | POA: Insufficient documentation

## 2012-04-24 DIAGNOSIS — R112 Nausea with vomiting, unspecified: Secondary | ICD-10-CM | POA: Insufficient documentation

## 2012-04-24 DIAGNOSIS — C9100 Acute lymphoblastic leukemia not having achieved remission: Secondary | ICD-10-CM | POA: Insufficient documentation

## 2012-04-24 DIAGNOSIS — I10 Essential (primary) hypertension: Secondary | ICD-10-CM | POA: Insufficient documentation

## 2012-04-24 HISTORY — DX: Other pulmonary embolism without acute cor pulmonale: I26.99

## 2012-04-24 HISTORY — DX: Nontraumatic intracerebral hemorrhage, unspecified: I61.9

## 2012-04-24 MED ORDER — MORPHINE SULFATE 4 MG/ML IJ SOLN
4.0000 mg | Freq: Once | INTRAMUSCULAR | Status: AC
  Administered 2012-04-25: 4 mg via INTRAVENOUS
  Filled 2012-04-24: qty 1

## 2012-04-24 MED ORDER — ONDANSETRON HCL 4 MG/2ML IJ SOLN
4.0000 mg | Freq: Once | INTRAMUSCULAR | Status: AC
  Administered 2012-04-25: 4 mg via INTRAVENOUS
  Filled 2012-04-24: qty 2

## 2012-04-24 NOTE — ED Notes (Signed)
Took oxycodone and zofran PTA.

## 2012-04-24 NOTE — ED Notes (Signed)
Pt c/o generalized body aches, HA, and n/v since this afternoon.  Pt has leukemia and has just started back chemotherapy.

## 2012-04-25 ENCOUNTER — Emergency Department (HOSPITAL_COMMUNITY): Payer: Medicaid Other

## 2012-04-25 LAB — POCT I-STAT, CHEM 8
BUN: 7 mg/dL (ref 6–23)
Chloride: 105 mEq/L (ref 96–112)
Creatinine, Ser: 0.8 mg/dL (ref 0.50–1.10)
Potassium: 3.8 mEq/L (ref 3.5–5.1)
Sodium: 140 mEq/L (ref 135–145)

## 2012-04-25 LAB — CBC
Platelets: 133 10*3/uL — ABNORMAL LOW (ref 150–400)
RBC: 3.94 MIL/uL (ref 3.87–5.11)
RDW: 13.6 % (ref 11.5–15.5)
WBC: 4.8 10*3/uL (ref 4.0–10.5)

## 2012-04-25 LAB — POCT I-STAT TROPONIN I: Troponin i, poc: 0 ng/mL (ref 0.00–0.08)

## 2012-04-25 LAB — URINALYSIS, ROUTINE W REFLEX MICROSCOPIC
Glucose, UA: NEGATIVE mg/dL
Hgb urine dipstick: NEGATIVE
Ketones, ur: 15 mg/dL — AB
Leukocytes, UA: NEGATIVE
Protein, ur: NEGATIVE mg/dL
Urobilinogen, UA: 0.2 mg/dL (ref 0.0–1.0)

## 2012-04-25 LAB — CK TOTAL AND CKMB (NOT AT ARMC)
CK, MB: 1.6 ng/mL (ref 0.3–4.0)
Total CK: 108 U/L (ref 7–177)

## 2012-04-25 MED ORDER — SODIUM CHLORIDE 0.9 % IV BOLUS (SEPSIS)
500.0000 mL | Freq: Once | INTRAVENOUS | Status: AC
  Administered 2012-04-25: 500 mL via INTRAVENOUS

## 2012-04-25 NOTE — ED Provider Notes (Signed)
History     CSN: [id number]  Arrival date & time 04/24/12  2153   First MD Initiated Contact with Patient 04/24/12 2337      Chief Complaint  Patient presents with  . Generalized Body Aches  . Nausea    (Consider location/radiation/quality/duration/timing/severity/associated sxs/prior treatment) Patient is a 47 y.o. female presenting with headaches.  Headache Pain location:  Frontal Radiates to:  Does not radiate Severity currently:  3/10 Severity at highest:  3/10 Onset quality:  Sudden Timing:  Constant Progression:  Waxing and waning Chronicity:  Recurrent Similar to prior headaches: yes   Context comment:  Hx of ich while on anticoagulation Relieved by:  Nothing Worsened by:  Nothing tried Ineffective treatments:  None tried Associated symptoms: myalgias and vomiting     Past Medical History  Diagnosis Date  . Hypertension   . ALL (acute lymphoblastic leukemia)   . Leukemia   . Brain bleed     07/04/11  . Pulmonary embolism     06/22/11    Past Surgical History  Procedure Laterality Date  . Cesarean section    . Foot surgery    . Foot surgery    . Cesarean section    . Eye surgery    . Craniotomy      Family History  Problem Relation Age of Onset  . Cancer Father   . Cancer Mother     colon    History  Substance Use Topics  . Smoking status: Never Smoker   . Smokeless tobacco: Never Used  . Alcohol Use: No    OB History   Grav Para Term Preterm Abortions TAB SAB Ect Mult Living                  Review of Systems  Gastrointestinal: Positive for vomiting.  Musculoskeletal: Positive for myalgias.  Neurological: Positive for headaches.  All other systems reviewed and are negative.    Allergies  Review of patient's allergies indicates no known allergies.  Home Medications   Current Outpatient Rx  Name  Route  Sig  Dispense  Refill  . acyclovir (ZOVIRAX) 400 MG tablet   Oral   Take 800 mg by mouth 2 (two) times daily.           . butalbital-acetaminophen-caffeine (FIORICET, ESGIC) 50-325-40 MG per tablet   Oral   Take 1-2 tablets by mouth every 6 (six) hours as needed.         . dasatinib (SPRYCEL) 50 MG tablet   Oral   Take 100 mg by mouth daily. Last dose 6/30 - may or may not resume         . folic acid (FOLVITE) 1 MG tablet   Oral   Take 1 mg by mouth daily.         Marland Kitchen lidocaine-prilocaine (EMLA) cream   Topical   Apply 1 application topically as needed.         . Magnesium 250 MG TABS   Oral   Take 1 tablet by mouth daily.         . Multiple Vitamin (MULTIVITAMIN) tablet   Oral   Take 1 tablet by mouth daily.         . ondansetron (ZOFRAN) 8 MG tablet   Oral   Take 8 mg by mouth every 8 (eight) hours as needed for nausea.          Marland Kitchen oxycodone (OXY-IR) 5 MG capsule   Oral   Take  5 mg by mouth every 4 (four) hours as needed for pain.          . potassium chloride (MICRO-K) 10 MEQ CR capsule   Oral   Take 20 mEq by mouth 2 (two) times daily.         Marland Kitchen sulfamethoxazole-trimethoprim (BACTRIM DS) 800-160 MG per tablet   Oral   Take 1 tablet by mouth See admin instructions. 1 tab BID on Mon, Wed and Fri         . SUMAtriptan (IMITREX) 50 MG tablet   Oral   Take 50 mg by mouth every 6 (six) hours as needed for migraine.         . traMADol (ULTRAM) 50 MG tablet   Oral   Take 50 mg by mouth every 8 (eight) hours as needed for pain.           BP 137/75  Pulse 88  Temp(Src) 98.2 F (36.8 C) (Oral)  Resp 16  SpO2 100%  Physical Exam  Nursing note and vitals reviewed. Constitutional: She is oriented to person, place, and time. She appears well-developed and well-nourished.  HENT:  Head: Normocephalic and atraumatic.  Eyes: Conjunctivae and EOM are normal. Pupils are equal, round, and reactive to light.  Neck: Normal range of motion.  Cardiovascular: Normal rate, regular rhythm and normal heart sounds.   Pulmonary/Chest: Effort normal and breath sounds normal.   Abdominal: Soft. Bowel sounds are normal.  Musculoskeletal: Normal range of motion.  Neurological: She is alert and oriented to person, place, and time.  Skin: Skin is warm and dry.  Psychiatric: She has a normal mood and affect. Her behavior is normal.    ED Course  Procedures (including critical care time)  Labs Reviewed  CULTURE, BLOOD (ROUTINE X 2)  CULTURE, BLOOD (ROUTINE X 2)  CBC  CK TOTAL AND CKMB  URINALYSIS, ROUTINE W REFLEX MICROSCOPIC   No results found.   No diagnosis found.    MDM  + hx of all,  Now with ha, myalgias.  Will lab,  Analgesia,  Ct head,  reassess        [name], MD 04/25/12 0011

## 2012-04-25 NOTE — ED Notes (Signed)
The patient is AOx4 and comfortable with her discharge instructions.  Her family member is driving her home.

## 2012-05-01 LAB — CULTURE, BLOOD (ROUTINE X 2): Culture: NO GROWTH

## 2012-05-06 ENCOUNTER — Other Ambulatory Visit: Payer: Self-pay | Admitting: Medical Oncology

## 2012-05-06 DIAGNOSIS — C9100 Acute lymphoblastic leukemia not having achieved remission: Secondary | ICD-10-CM

## 2012-05-07 ENCOUNTER — Telehealth: Payer: Self-pay | Admitting: Oncology

## 2012-05-09 ENCOUNTER — Encounter: Payer: Self-pay | Admitting: Oncology

## 2012-05-12 ENCOUNTER — Emergency Department (HOSPITAL_COMMUNITY): Payer: Medicaid Other

## 2012-05-12 ENCOUNTER — Inpatient Hospital Stay (HOSPITAL_COMMUNITY)
Admission: EM | Admit: 2012-05-12 | Discharge: 2012-05-15 | DRG: 808 | Disposition: A | Discharge status: Home / Self Care | Payer: Medicaid Other | Attending: Internal Medicine | Admitting: Internal Medicine

## 2012-05-12 ENCOUNTER — Encounter (HOSPITAL_COMMUNITY): Payer: Self-pay | Admitting: Emergency Medicine

## 2012-05-12 DIAGNOSIS — R5081 Fever presenting with conditions classified elsewhere: Secondary | ICD-10-CM | POA: Diagnosis present

## 2012-05-12 DIAGNOSIS — C9100 Acute lymphoblastic leukemia not having achieved remission: Secondary | ICD-10-CM

## 2012-05-12 DIAGNOSIS — Z6838 Body mass index (BMI) 38.0-38.9, adult: Secondary | ICD-10-CM

## 2012-05-12 DIAGNOSIS — R51 Headache: Secondary | ICD-10-CM | POA: Diagnosis present

## 2012-05-12 DIAGNOSIS — I6200 Nontraumatic subdural hemorrhage, unspecified: Secondary | ICD-10-CM | POA: Diagnosis present

## 2012-05-12 DIAGNOSIS — D649 Anemia, unspecified: Secondary | ICD-10-CM

## 2012-05-12 DIAGNOSIS — R509 Fever, unspecified: Secondary | ICD-10-CM | POA: Diagnosis present

## 2012-05-12 DIAGNOSIS — D709 Neutropenia, unspecified: Secondary | ICD-10-CM

## 2012-05-12 DIAGNOSIS — D61810 Antineoplastic chemotherapy induced pancytopenia: Secondary | ICD-10-CM | POA: Diagnosis present

## 2012-05-12 DIAGNOSIS — I10 Essential (primary) hypertension: Secondary | ICD-10-CM

## 2012-05-12 DIAGNOSIS — T451X5A Adverse effect of antineoplastic and immunosuppressive drugs, initial encounter: Secondary | ICD-10-CM | POA: Diagnosis present

## 2012-05-12 DIAGNOSIS — N289 Disorder of kidney and ureter, unspecified: Secondary | ICD-10-CM | POA: Diagnosis present

## 2012-05-12 DIAGNOSIS — D696 Thrombocytopenia, unspecified: Secondary | ICD-10-CM | POA: Diagnosis present

## 2012-05-12 DIAGNOSIS — C9101 Acute lymphoblastic leukemia, in remission: Secondary | ICD-10-CM | POA: Diagnosis present

## 2012-05-12 DIAGNOSIS — Z9221 Personal history of antineoplastic chemotherapy: Secondary | ICD-10-CM

## 2012-05-12 DIAGNOSIS — Z79899 Other long term (current) drug therapy: Secondary | ICD-10-CM

## 2012-05-12 DIAGNOSIS — D702 Other drug-induced agranulocytosis: Principal | ICD-10-CM | POA: Diagnosis present

## 2012-05-12 DIAGNOSIS — I2699 Other pulmonary embolism without acute cor pulmonale: Secondary | ICD-10-CM | POA: Diagnosis present

## 2012-05-12 DIAGNOSIS — Z86711 Personal history of pulmonary embolism: Secondary | ICD-10-CM

## 2012-05-12 HISTORY — DX: Unspecified osteoarthritis, unspecified site: M19.90

## 2012-05-12 HISTORY — DX: Headache: R51

## 2012-05-12 HISTORY — DX: Anemia, unspecified: D64.9

## 2012-05-12 MED ORDER — SODIUM CHLORIDE 0.9 % IV BOLUS (SEPSIS)
1000.0000 mL | Freq: Once | INTRAVENOUS | Status: AC
  Administered 2012-05-13: 1000 mL via INTRAVENOUS

## 2012-05-12 MED ORDER — ACETAMINOPHEN 325 MG PO TABS
650.0000 mg | ORAL_TABLET | Freq: Once | ORAL | Status: AC
  Administered 2012-05-13: 650 mg via ORAL
  Filled 2012-05-12: qty 2

## 2012-05-12 NOTE — ED Notes (Signed)
Pt c/o fever and HA onset @ 1400 today. Pt is currently in a chemo trial for ALL

## 2012-05-13 ENCOUNTER — Inpatient Hospital Stay (HOSPITAL_COMMUNITY): Payer: Medicaid Other

## 2012-05-13 ENCOUNTER — Encounter (HOSPITAL_COMMUNITY): Payer: Self-pay | Admitting: Internal Medicine

## 2012-05-13 DIAGNOSIS — R509 Fever, unspecified: Secondary | ICD-10-CM

## 2012-05-13 DIAGNOSIS — C9100 Acute lymphoblastic leukemia not having achieved remission: Secondary | ICD-10-CM

## 2012-05-13 DIAGNOSIS — D709 Neutropenia, unspecified: Secondary | ICD-10-CM

## 2012-05-13 DIAGNOSIS — D649 Anemia, unspecified: Secondary | ICD-10-CM

## 2012-05-13 DIAGNOSIS — D696 Thrombocytopenia, unspecified: Secondary | ICD-10-CM

## 2012-05-13 DIAGNOSIS — I10 Essential (primary) hypertension: Secondary | ICD-10-CM

## 2012-05-13 DIAGNOSIS — D61818 Other pancytopenia: Secondary | ICD-10-CM

## 2012-05-13 LAB — COMPREHENSIVE METABOLIC PANEL
AST: 23 U/L (ref 0–37)
BUN: 10 mg/dL (ref 6–23)
CO2: 26 mEq/L (ref 19–32)
Calcium: 8.7 mg/dL (ref 8.4–10.5)
Creatinine, Ser: 0.85 mg/dL (ref 0.50–1.10)
GFR calc Af Amer: >90 mL/min (ref >90)
GFR calc non Af Amer: 80 mL/min — ABNORMAL LOW (ref >90)
Glucose, Bld: 117 mg/dL — ABNORMAL HIGH (ref 70–99)
Total Bilirubin: 0.3 mg/dL (ref 0.3–1.2)

## 2012-05-13 LAB — CBC WITH DIFFERENTIAL/PLATELET
Hemoglobin: 9.9 g/dL — ABNORMAL LOW (ref 12.0–15.0)
Lymphs Abs: 0.8 10*3/uL (ref 0.7–4.0)
Monocytes Relative: 12 % (ref 3–12)
Neutro Abs: 1.1 10*3/uL — ABNORMAL LOW (ref 1.7–7.7)
Neutrophils Relative %: 47 % (ref 43–77)
Platelets: 109 10*3/uL — ABNORMAL LOW (ref 150–400)
RBC: 3.31 MIL/uL — ABNORMAL LOW (ref 3.87–5.11)
WBC: 2.3 10*3/uL — ABNORMAL LOW (ref 4.0–10.5)

## 2012-05-13 LAB — URINALYSIS, ROUTINE W REFLEX MICROSCOPIC
Glucose, UA: NEGATIVE mg/dL
Hgb urine dipstick: NEGATIVE
Leukocytes, UA: NEGATIVE
Protein, ur: NEGATIVE mg/dL
Specific Gravity, Urine: 1.014 (ref 1.005–1.030)

## 2012-05-13 LAB — LACTATE DEHYDROGENASE: LDH: 240 U/L (ref 94–250)

## 2012-05-13 LAB — POCT I-STAT, CHEM 8
BUN: 12 mg/dL (ref 6–23)
Creatinine, Ser: 1 mg/dL (ref 0.50–1.10)
Hemoglobin: 10.9 g/dL — ABNORMAL LOW (ref 12.0–15.0)
Potassium: 3.9 mEq/L (ref 3.5–5.1)
Sodium: 140 mEq/L (ref 135–145)
TCO2: 25 mmol/L (ref 0–100)

## 2012-05-13 LAB — CBC
HCT: 30.4 % — ABNORMAL LOW (ref 36.0–46.0)
Hemoglobin: 10.4 g/dL — ABNORMAL LOW (ref 12.0–15.0)
MCHC: 34.2 g/dL (ref 30.0–36.0)
RBC: 3.51 MIL/uL — ABNORMAL LOW (ref 3.87–5.11)
WBC: 3.1 10*3/uL — ABNORMAL LOW (ref 4.0–10.5)

## 2012-05-13 LAB — LACTIC ACID, PLASMA: Lactic Acid, Venous: 0.4 mmol/L — ABNORMAL LOW (ref 0.5–2.2)

## 2012-05-13 LAB — TROPONIN I: Troponin I: <0.3 ng/mL (ref <0.30)

## 2012-05-13 MED ORDER — SUMATRIPTAN SUCCINATE 50 MG PO TABS
50.0000 mg | ORAL_TABLET | Freq: Four times a day (QID) | ORAL | Status: DC | PRN
  Administered 2012-05-13: 50 mg via ORAL
  Filled 2012-05-13 (×2): qty 1

## 2012-05-13 MED ORDER — PROMETHAZINE HCL 25 MG PO TABS: 12.5000 mg | ORAL_TABLET | Freq: Four times a day (QID) | ORAL | Status: DC | PRN

## 2012-05-13 MED ORDER — SODIUM CHLORIDE 0.9 % IJ SOLN
10.0000 mL | INTRAMUSCULAR | Status: DC | PRN
  Administered 2012-05-13 – 2012-05-15 (×3): 10 mL

## 2012-05-13 MED ORDER — IOHEXOL 350 MG/ML SOLN
100.0000 mL | Freq: Once | INTRAVENOUS | Status: AC | PRN
  Administered 2012-05-13: 100 mL via INTRAVENOUS

## 2012-05-13 MED ORDER — VANCOMYCIN HCL IN DEXTROSE 1-5 GM/200ML-% IV SOLN
1000.0000 mg | Freq: Once | INTRAVENOUS | Status: AC
  Administered 2012-05-13: 1000 mg via INTRAVENOUS
  Filled 2012-05-13: qty 200

## 2012-05-13 MED ORDER — ADULT MULTIVITAMIN W/MINERALS CH
1.0000 | ORAL_TABLET | Freq: Every day | ORAL | Status: DC
  Administered 2012-05-13 – 2012-05-15 (×3): 1 via ORAL
  Filled 2012-05-13 (×3): qty 1

## 2012-05-13 MED ORDER — AMITRIPTYLINE HCL 10 MG PO TABS
10.0000 mg | ORAL_TABLET | Freq: Every day | ORAL | Status: DC
  Administered 2012-05-13 – 2012-05-14 (×2): 10 mg via ORAL
  Filled 2012-05-13 (×3): qty 1

## 2012-05-13 MED ORDER — ACYCLOVIR 800 MG PO TABS
800.0000 mg | ORAL_TABLET | Freq: Two times a day (BID) | ORAL | Status: DC
  Administered 2012-05-13 – 2012-05-15 (×5): 800 mg via ORAL
  Filled 2012-05-13 (×6): qty 1

## 2012-05-13 MED ORDER — ONDANSETRON HCL 4 MG PO TABS: 4.0000 mg | ORAL_TABLET | Freq: Four times a day (QID) | ORAL | Status: DC | PRN

## 2012-05-13 MED ORDER — ONDANSETRON HCL 4 MG PO TABS: 8.0000 mg | ORAL_TABLET | Freq: Three times a day (TID) | ORAL | Status: DC | PRN

## 2012-05-13 MED ORDER — SODIUM CHLORIDE 0.9 % IJ SOLN
3.0000 mL | Freq: Two times a day (BID) | INTRAMUSCULAR | Status: DC
  Administered 2012-05-13 – 2012-05-14 (×2): 3 mL via INTRAVENOUS

## 2012-05-13 MED ORDER — MAGNESIUM OXIDE 400 (241.3 MG) MG PO TABS
200.0000 mg | ORAL_TABLET | Freq: Every day | ORAL | Status: DC
  Administered 2012-05-13 – 2012-05-15 (×3): 200 mg via ORAL
  Filled 2012-05-13 (×3): qty 0.5

## 2012-05-13 MED ORDER — OXYCODONE HCL 5 MG PO TABS
5.0000 mg | ORAL_TABLET | ORAL | Status: DC | PRN
  Administered 2012-05-13: 5 mg via ORAL
  Filled 2012-05-13: qty 1

## 2012-05-13 MED ORDER — MAGNESIUM 250 MG PO TABS: 1.0000 | ORAL_TABLET | Freq: Every day | ORAL | Status: DC

## 2012-05-13 MED ORDER — SODIUM CHLORIDE 0.9 % IV SOLN
INTRAVENOUS | Status: AC
  Administered 2012-05-13: 03:00:00 via INTRAVENOUS

## 2012-05-13 MED ORDER — TRAMADOL HCL 50 MG PO TABS: 50.0000 mg | ORAL_TABLET | Freq: Three times a day (TID) | ORAL | Status: DC | PRN

## 2012-05-13 MED ORDER — DEXTROSE 5 % IV SOLN
2.0000 g | Freq: Three times a day (TID) | INTRAVENOUS | Status: DC
  Administered 2012-05-13 – 2012-05-15 (×8): 2 g via INTRAVENOUS
  Filled 2012-05-13 (×9): qty 2

## 2012-05-13 MED ORDER — SODIUM CHLORIDE 0.9 % IV SOLN
INTRAVENOUS | Status: AC
  Administered 2012-05-13: 125 mL/h via INTRAVENOUS
  Administered 2012-05-13: 04:00:00 via INTRAVENOUS

## 2012-05-13 MED ORDER — ONDANSETRON HCL 4 MG/2ML IJ SOLN: 4.0000 mg | Freq: Four times a day (QID) | INTRAMUSCULAR | Status: DC | PRN

## 2012-05-13 MED ORDER — MORPHINE SULFATE 2 MG/ML IJ SOLN: 2.0000 mg | INTRAMUSCULAR | Status: DC | PRN

## 2012-05-13 MED ORDER — POTASSIUM CHLORIDE ER 10 MEQ PO CPCR: 20.0000 meq | ORAL_CAPSULE | Freq: Two times a day (BID) | ORAL | Status: DC

## 2012-05-13 MED ORDER — ACETAMINOPHEN 650 MG RE SUPP: 650.0000 mg | Freq: Four times a day (QID) | RECTAL | Status: DC | PRN

## 2012-05-13 MED ORDER — SODIUM CHLORIDE 0.9 % IJ SOLN
10.0000 mL | Freq: Two times a day (BID) | INTRAMUSCULAR | Status: DC
  Administered 2012-05-13: 10 mL

## 2012-05-13 MED ORDER — ACETAMINOPHEN 325 MG PO TABS: 650.0000 mg | ORAL_TABLET | Freq: Four times a day (QID) | ORAL | Status: DC | PRN

## 2012-05-13 MED ORDER — SULFAMETHOXAZOLE-TMP DS 800-160 MG PO TABS
1.0000 | ORAL_TABLET | ORAL | Status: DC
  Administered 2012-05-13 – 2012-05-15 (×3): 1 via ORAL
  Filled 2012-05-13 (×4): qty 1

## 2012-05-13 MED ORDER — VANCOMYCIN HCL IN DEXTROSE 1-5 GM/200ML-% IV SOLN
1000.0000 mg | Freq: Two times a day (BID) | INTRAVENOUS | Status: DC
  Administered 2012-05-13 – 2012-05-15 (×4): 1000 mg via INTRAVENOUS
  Filled 2012-05-13 (×5): qty 200

## 2012-05-13 MED ORDER — POTASSIUM CHLORIDE CRYS ER 20 MEQ PO TBCR
20.0000 meq | EXTENDED_RELEASE_TABLET | Freq: Two times a day (BID) | ORAL | Status: DC
  Administered 2012-05-13 – 2012-05-15 (×5): 20 meq via ORAL
  Filled 2012-05-13 (×6): qty 1

## 2012-05-13 MED ORDER — FOLIC ACID 1 MG PO TABS
1.0000 mg | ORAL_TABLET | Freq: Every day | ORAL | Status: DC
  Administered 2012-05-13 – 2012-05-15 (×3): 1 mg via ORAL
  Filled 2012-05-13 (×3): qty 1

## 2012-05-13 NOTE — Progress Notes (Signed)
  Echocardiogram 2D Echocardiogram has been performed.  Lynn [name] A 05/13/2012, 1:13 PM

## 2012-05-13 NOTE — Progress Notes (Signed)
Patient stated that she takes Dasatinib 100mg  QD for her "cancer drug" which was restarted on April 4th, 2014.  Dr. Janee Morn paged with this information.  Will monitor.

## 2012-05-13 NOTE — Progress Notes (Signed)
HOSPITAL PROGRESS NOTE   Lynn [name] 47 y.o.  1965/07/25    HISTORY: Lynn [name] is well known to [place] ever since the diagnosis of her B-cell acute lymphoblastic leukemia, Philadelphia chromosome positive, last May 2013. She has received all of her treatments at [facility] and is currently in complete remission being maintained on dasatinib 100 mg daily. Her last bone marrow was on 03/08/2012 and was normal. She is scheduled for another bone marrow in early May.  Lynn [name] was admitted to the hospital yesterday evening after presenting to the emergency room with a history of a temperature to 101.8 associated with chills, but not rigors, recurrence of headache and possibly some fleeting left chest pain. Patient denies any urinary, GI or respiratory symptoms. She denies sore throat and exposure to any one who is ill.  Lynn [name] tells me that she was admitted to [facility] on 04/26/2012 through 05/01/2012 with a very similar presentation, fever in association with mild neutropenia. She was treated with broad-spectrum antibiotics. Dasatinib was held for several days. All cultures were negative and the fever resolved. Lynn [name] was seen by the neurology service for evaluation of her severe recurring headaches. Fioricet was discontinued and Elavil was prescribed with improvement. She also underwent a spinal tap that was negative.  I spoke with Dr. Kerri Perches, the patient's hematologist at [facility] today.   PROBLEM LIST:  1. B-cell acute lymphoblastic leukemia, Philadelphia chromosome  positive, t(9; 22) initially with 71% blasts seen on bone marrow  carried out on Jun 19, 2011. Subsequent bone marrow at [facility] on 06/22/2011 showed 98% blasts and peripheral blood showed 71% blasts. The patient received  induction treatment with dasatinib (Sprycel) and Decadron. Her  hospital course was complicated by the  development of a posterior  fossa subdural hematoma requiring a suboccipital craniotomy and  evacuation of the hematoma on 07/04/2011. The patient also had  neutropenic fever with negative cultures covered with broad-  spectrum antibiotics. She had some headaches, vaginal bleeding and transaminitis. Admission to [facility] was from 06/21/2011 through 07/17/2011. Bone marrow carried out on 07/06/2011 showed a hypocellular bone marrow with no evidence for ALL.  The patient was enrolled on protocol CALGB 16109. That protocol  consisted of treatment with dasatinib along with chemotherapy. A bone marrow carried out on 07/28/2011 showed pan hypoplasia with no evidence for acute leukemia. Cytogenetics were normal. FISH studies were negative for t(9; 22). PCR for small p190 in both the peripheral blood and bone marrow returned negative at 0.000. On 08/08/2011 the patient received high-dose methotrexate with leucovorin rescue. She also received IV vincristine 2.0 mg and CNS prophylaxis with intrathecal methotrexate/hydrocortisone. CSF was negative. On 09/18/2011 bone marrow biopsy showed no evidence of ALL. PCR for BCR/ABL showed 0.00001 fusion events. It was felt that the patient had achieved a complete hematologic response. On 09/26/2011 mobilization therapy with cytarabine/etoposide was started The patient was admitted to the hospital from 11/14/2011 through 11/28/2011, at which time she received high-dose therapy and her autologous stem cell transplant. She received melphalan on days -2 and days -1. Day 0 was 11/16/2011. The patient also received G-CSF. She had severe mucositis and dysphagia on 11/20/2011 which required morphine by PCA. She had some fevers on 11/23/2011. She was treated with antibiotics for prophylaxis. She had some headaches, but those subsequently resolved. Venous Dopplers of her lower extremities were done bilaterally because of some swelling of her legs.  These  were negative for blood clots. It will be recalled that the patient does have an inferior vena cava Greenfield filter that was placed on 07/07/2011 for her prior history of pulmonary emboli. As stated, the patient was discharged from [facility] on October 29th. Lynn [name] tells me that she was restarted on dasatinib 100 mg daily in mid November. Records from [facility] indicate that bone marrows carried out on 10/16/2011, 11/02/2011, 12/15/2011 and 03/08/2012 were negative for any signs of leukemia.   2. Pulmonary emboli involving the right upper and right lower lobes  with positive CT chest angiogram on 06/17/2011 and negative  Dopplers.   3. Development of subdural hematoma involving the posterior fossa when the patient was hospitalized at [facility], status post suboccipital craniotomy and evacuation  of hematoma on 07/04/2011.   4. History of hypertension.  5. Morbid obesity.  6. Arthritis involving the right hip.  7. History of palpitations.  8. History of migraine headaches.  9. Placement of inferior vena cava Greenfield filter on 07/07/2011.  10. Mild renal insufficiency  11. Right-sided double lumen Port-A-Cath placed at [facility] on 07/12/2011    MEDICATIONS:  Scheduled Meds: . acyclovir  800 mg Oral BID  . amitriptyline  10 mg Oral QHS  . ceFEPime (MAXIPIME) IV  2 g Intravenous Q8H  . folic acid  1 mg Oral Daily  . magnesium oxide  200 mg Oral Daily  . multivitamin with minerals  1 tablet Oral Daily  . potassium chloride  20 mEq Oral BID  . sodium chloride  10-40 mL Intracatheter Q12H  . sodium chloride  3 mL Intravenous Q12H  . sulfamethoxazole-trimethoprim  1 tablet Oral Custom  . vancomycin  1,000 mg Intravenous Q12H   Continuous Infusions: . sodium chloride 125 mL/hr (05/13/12 1506)   PRN Meds:.acetaminophen, acetaminophen,  morphine injection, ondansetron (ZOFRAN) IV, ondansetron, ondansetron, oxyCODONE, promethazine, sodium chloride, SUMAtriptan, traMADol  I have reviewed the patient's current medications.  PHYSICAL EXAM:  Blood pressure 120/83, pulse 95, temperature 98.6 F (37 C), temperature source Oral, resp. rate 16, height 5\' 6"  (1.676 m), weight 240 lb (108.863 kg), SpO2 97.00%. General: Patient does not look acutely ill or septic but is having stabbing pain localized to multiple areas of scalp.   HEENT:  No scleral icterus. Mouth and pharynx are benign. Lymphatics: No adenopathy. Breasts: Not examined. Resp: Essentially clear. Cardiac: Regular rhythm without murmur or rub. Abdomen: Obese. Benign. Extrem: wearing SCDs Neuro: Nonfocal  Skin: No rashes, petechiae or purpura. Right-sided double-lumen Port-A-Cath with IV fluids infusing.   LABS:  CBC:   Recent Labs Lab 05/13/12 0018 05/13/12 0027 05/13/12 0515  WBC 3.1*  --  2.3*  HGB 10.4* 10.9* 9.9*  HCT 30.4* 32.0* 28.6*  PLT 121*  --  109*  MCV 86.6  --  86.4  MCH 29.6  --  29.9  MCHC 34.2  --  34.6  RDW 13.1  --  13.2  LYMPHSABS  --   --  0.8  MONOABS  --   --  0.3  EOSABS  --   --  0.1  BASOSABS  --   --  0.0     CHEM:   Recent Labs Lab 05/13/12 0027 05/13/12 0515  NA 140 140  K 3.9 3.6  CL 103 106  CO2  --  26  GLUCOSE 108* 117*  BUN 12 10  CREATININE 1.00 0.85  CALCIUM  --  8.7  AST  --  23  ALT  --  22  ALKPHOS  --  75  BILITOT  --  0.3   Nasal swab for influenza and LDH are pending.   RADIOLOGY:  Dg Chest 2 View  05/13/2012  *RADIOLOGY REPORT*  Clinical Data: Fever.  CHEST - 2 VIEW  Comparison: 03/26 and 03/18/2012  Findings: Power port in place.  Heart size and pulmonary vascularity are normal.  Lungs are clear.  No effusions.  No osseous abnormality.  IMPRESSION: No acute disease in the chest.   Original Report Authenticated By: Francene Boyers, [name]    Ct Head Wo Contrast  05/13/2012  *RADIOLOGY  REPORT*  Clinical Data: Headache.  Remote craniotomy for subdural hematoma.  CT HEAD WITHOUT CONTRAST  Technique:  Contiguous axial images were obtained from the base of the skull through the vertex without contrast.  Comparison: CT of the head performed 04/25/2012  Findings: There is no evidence of acute infarction, mass lesion, or intra- or extra-axial hemorrhage on CT.  The posterior fossa, including the cerebellum, brainstem and fourth ventricle, is within normal limits.  The third and lateral ventricles, and basal ganglia are unremarkable in appearance.  The cerebral hemispheres are symmetric in appearance, with normal gray- white differentiation.  No mass effect or midline shift is seen.  There is no evidence of fracture; the patient is status post occipital craniotomy.  The orbits are within normal limits.  A mucus retention cyst or polyp is noted within the right maxillary sinus, and an additional mucus retention cyst or polyp is noted within the sphenoid sinus.  The remaining paranasal sinuses and mastoid air cells are well-aerated.  No significant soft tissue abnormalities are seen.  IMPRESSION:  1.  No acute intracranial pathology seen on CT. 2.  Mucus retention cysts or polyps noted within the right maxillary sinus and sphenoid sinus.   Original Report Authenticated By: Tonia Ghent, [name]    Ct Angio Chest Pe W/cm &/or Wo Cm  05/13/2012  *RADIOLOGY REPORT*  Clinical Data: Left-sided chest pain.  CT ANGIOGRAPHY CHEST  Technique:  Multidetector CT imaging of the chest using the standard protocol during bolus administration of intravenous contrast. Multiplanar reconstructed images including MIPs were obtained and reviewed to evaluate the vascular anatomy.  Contrast: OMNIPAQUE IOHEXOL 350 MG/ML SOLN  Comparison: Chest radiograph performed earlier today at 12:21 a.m., and CTA of the chest performed 02/06/2012  Findings: There is no evidence of pulmonary embolus.  Minimal bibasilar atelectasis is  noted.  The lungs are otherwise clear.  There is no evidence of significant focal consolidation, pleural effusion or pneumothorax.  No masses are identified; no abnormal focal contrast enhancement is seen.  No definite mediastinal lymphadenopathy is seen.  The patient's right chest port is noted ending about the cavoatrial junction.  No pericardial effusion is identified.  The great vessels are grossly unremarkable in appearance.  No axillary lymphadenopathy is seen. A vague 1.1 cm hypodensity is noted within the right thyroid lobe.  The visualized portions of the liver and spleen are unremarkable.  No acute osseous abnormalities are seen.  IMPRESSION:  1.  No evidence of pulmonary embolus. 2.  Minimal bibasilar atelectasis noted; lungs otherwise clear. 3.  Vague 1.1 cm hypodensity within the right thyroid lobe.  If not previously assessed, consider further evaluation with thyroid ultrasound.  If patient is clinically hyperthyroid, consider nuclear medicine thyroid uptake and scan.   Original Report  Authenticated By: Tonia Ghent, [name]      ASSESSMENT & PLAN: 1. Fever in association with mild pancytopenia and neutropenia. Blood and urine cultures are pending. Patient is currently receiving cefepime and vancomycin. Patient is on droplet precautions. Nasal swab for influenza is pending. Antibiotics can be discontinued if cultures are negative and fever resolves. If fever persists without explanation, infectious diseases consultation will be needed.  2. Mild pancytopenia and neutropenia, most likely secondary to dasatinib. Recommend holding dasatinib at this time, to restart when fever resolves and blood counts improve.  3. Headaches/scalp pain and tenderness. Continue symptomatic treatment with Elavil and prn narcotics.  4. Full CODE STATUS.  5. Other problems as listed above.   Feel free to page me if you have any questions. Lynn [name] has followup appointments scheduled at [facility].  Samul Dada 05/13/2012, 3:30 PM Pager [phone]

## 2012-05-13 NOTE — Progress Notes (Addendum)
ANTIBIOTIC CONSULT NOTE - INITIAL  Pharmacy Consult for Cefepime & Vancomycin Indication: Fever of unknown origin - cancer patient  No Known Allergies  Patient Measurements: Height: 5\' 5"  (165.1 cm) Weight: 240 lb (108.863 kg) IBW/kg (Calculated) : 57  Vital Signs: Temp: 99.2 F (37.3 C) (04/14 0106) Temp src: Oral (04/14 0106) BP: 140/85 mmHg (04/13 2303) Pulse Rate: 119 (04/13 2303) Intake/Output from previous day:   Intake/Output from this shift:    Labs:  Recent Labs  05/13/12 0018 05/13/12 0027  WBC 3.1*  --   HGB 10.4* 10.9*  PLT 121*  --   CREATININE  --  1.00   Estimated Creatinine Clearance: 85.4 ml/min (by C-G formula based on Cr of 1). No results found for this basename: VANCOTROUGH, VANCOPEAK, VANCORANDOM, GENTTROUGH, GENTPEAK, GENTRANDOM, TOBRATROUGH, TOBRAPEAK, TOBRARND, AMIKACINPEAK, AMIKACINTROU, AMIKACIN,  in the last 72 hours   Microbiology: Recent Results (from the past 720 hour(s))  CULTURE, BLOOD (ROUTINE X 2)     Status: None   Collection Time    04/25/12 12:00 AM      Result Value Range Status   Specimen Description BLOOD LEFT ARM   Final   Special Requests BOTTLES DRAWN AEROBIC AND ANAEROBIC 10CC EACH   Final   Culture  Setup Time 04/25/2012 08:14   Final   Culture NO GROWTH 5 DAYS   Final   Report Status 05/01/2012 FINAL   Final  CULTURE, BLOOD (ROUTINE X 2)     Status: None   Collection Time    04/25/12 12:05 AM      Result Value Range Status   Specimen Description BLOOD RIGHT ARM   Final   Special Requests BOTTLES DRAWN AEROBIC AND ANAEROBIC 10CC EACH   Final   Culture  Setup Time 04/25/2012 08:14   Final   Culture NO GROWTH 5 DAYS   Final   Report Status 05/01/2012 FINAL   Final    Medical History: Past Medical History  Diagnosis Date  . Hypertension   . ALL (acute lymphoblastic leukemia)   . Leukemia   . Brain bleed     07/04/11  . Pulmonary embolism     06/22/11    Medications:  Scheduled:  . [COMPLETED] acetaminophen   650 mg Oral Once   Infusions:  . [COMPLETED] sodium chloride Stopped (05/13/12 0125)  . vancomycin     Assessment:  47 year old female with ALL undergoing chemotherapy with complaint of fever and headache  Patient to begin antibiotics for febrile neutropenia pending labs  Vancomycin 1gm IV x 1 ordered in the ED  Cefepime per pharmacy ordered  Urine and blood x 2 cultures ordered  Goal of Therapy:  Eradication of suspected infection  Plan:   Cefepime 2gm IV q8h  Follow cultures/sensitivies/renal function  , Joselyn Glassman, PharmD 05/13/2012,2:02 AM  ADDENDUM: Vancomycin per pharmacy ordered for fever of unknown origin in a cancer patient Vancomycin 1gm ordered in ED was given @ 02:16 on 4/14  Goal:  Vancomycin trough level of 15-20 mcg/ml  Plan:  Give an additional 1gm dose of Vancomycin (for total of 2000mg  load as pt = 108 kg) followed by 1000mg  IV q12h            Follow renal function/cultures/clinical presentation  Terrilee Files, PharmD 05/13/12 @ 03:51

## 2012-05-13 NOTE — ED Provider Notes (Signed)
History     CSN: [id number]  Arrival date & time 05/12/12  2255   First MD Initiated Contact with Patient 05/12/12 2341      Chief Complaint  Patient presents with  . Fever    (Consider location/radiation/quality/duration/timing/severity/associated sxs/prior treatment) HPI History provided by patient. Has ALL, chronic headaches and presents tonight with fever and persistent headache described as intermittent stabbing pains in the back of her head. No associated neck pain and neck stiffness. No rash. No sore throat. No known sick contacts. No change and he will medications prescribed by [facility] where she is followed. She has had multiple CAT scans and MRIs of her brain related to these headaches, asthma and admitted to [facility] and lumbar puncture and another CT scan reported as normal.  No associated weakness or numbness. Prior history of PE and while anticoagulated had intracranial bleed. Abdominal pain. No nausea vomiting or diarrhea. Symptoms moderate in severity. Past Medical History  Diagnosis Date  . Hypertension   . ALL (acute lymphoblastic leukemia)   . Leukemia   . Brain bleed     07/04/11  . Pulmonary embolism     06/22/11    Past Surgical History  Procedure Laterality Date  . Cesarean section    . Foot surgery    . Foot surgery    . Cesarean section    . Eye surgery    . Craniotomy      Family History  Problem Relation Age of Onset  . Cancer Father   . Cancer Mother     colon    History  Substance Use Topics  . Smoking status: Never Smoker   . Smokeless tobacco: Never Used  . Alcohol Use: No    OB History   Grav Para Term Preterm Abortions TAB SAB Ect Mult Living                  Review of Systems  Constitutional: Positive for fever. Negative for chills.  HENT: Negative for neck pain and neck stiffness.   Eyes: Negative for pain.  Respiratory: Negative for shortness of breath.   Cardiovascular: Negative for chest pain.   Gastrointestinal: Negative for abdominal pain.  Genitourinary: Negative for dysuria.  Musculoskeletal: Negative for back pain.  Skin: Negative for rash.  Neurological: Positive for headaches. Negative for seizures, syncope, weakness and numbness.  All other systems reviewed and are negative.    Allergies  Review of patient's allergies indicates no known allergies.  Home Medications   Current Outpatient Rx  Name  Route  Sig  Dispense  Refill  . acyclovir (ZOVIRAX) 400 MG tablet   Oral   Take 800 mg by mouth 2 (two) times daily.          . butalbital-acetaminophen-caffeine (FIORICET, ESGIC) 50-325-40 MG per tablet   Oral   Take 1-2 tablets by mouth every 6 (six) hours as needed for headache.          . dasatinib (SPRYCEL) 50 MG tablet   Oral   Take 100 mg by mouth daily.          . folic acid (FOLVITE) 1 MG tablet   Oral   Take 1 mg by mouth daily.         Marland Kitchen lidocaine-prilocaine (EMLA) cream   Topical   Apply 1 application topically as needed.         . Magnesium 250 MG TABS   Oral   Take 1 tablet by mouth  daily.         . Multiple Vitamin (MULTIVITAMIN) tablet   Oral   Take 1 tablet by mouth daily.         . ondansetron (ZOFRAN) 8 MG tablet   Oral   Take 8 mg by mouth every 8 (eight) hours as needed for nausea.          Marland Kitchen oxycodone (OXY-IR) 5 MG capsule   Oral   Take 5 mg by mouth every 4 (four) hours as needed for pain.          . potassium chloride (MICRO-K) 10 MEQ CR capsule   Oral   Take 20 mEq by mouth 2 (two) times daily.         Marland Kitchen sulfamethoxazole-trimethoprim (BACTRIM DS) 800-160 MG per tablet   Oral   Take 1 tablet by mouth See admin instructions. 1 tab BID on Mon, Wed and Fri         . SUMAtriptan (IMITREX) 50 MG tablet   Oral   Take 50 mg by mouth every 6 (six) hours as needed for migraine.         . traMADol (ULTRAM) 50 MG tablet   Oral   Take 50 mg by mouth every 8 (eight) hours as needed for pain.            BP 140/85  Pulse 119  Temp(Src) 100.3 F (37.9 C) (Oral)  Resp 21  Ht 5\' 5"  (1.651 m)  Wt 240 lb (108.863 kg)  BMI 39.94 kg/m2  SpO2 98%  Physical Exam  Constitutional: She is oriented to person, place, and time. She appears well-developed and well-nourished.  HENT:  Head: Normocephalic and atraumatic.  Mouth/Throat: Oropharynx is clear and moist. No oropharyngeal exudate.  Eyes: Conjunctivae and EOM are normal. Pupils are equal, round, and reactive to light. No scleral icterus.  Neck: Normal range of motion. Neck supple.  Cardiovascular: Regular rhythm and intact distal pulses.   Tachycardic  Pulmonary/Chest: Effort normal. No respiratory distress. She exhibits no tenderness.  Abdominal: Soft. Bowel sounds are normal. She exhibits no distension. There is no tenderness. There is no rebound and no guarding.  Musculoskeletal: Normal range of motion. She exhibits no edema.  Neurological: She is alert and oriented to person, place, and time.  Skin: Skin is warm and dry.    ED Course  Procedures (including critical care time)  Results for orders placed during the hospital encounter of 05/12/12  CBC      Result Value Range   WBC 3.1 (*) 4.0 - 10.5 K/uL   RBC 3.51 (*) 3.87 - 5.11 MIL/uL   Hemoglobin 10.4 (*) 12.0 - 15.0 g/dL   HCT 96.0 (*) 45.4 - 09.8 %   MCV 86.6  78.0 - 100.0 fL   MCH 29.6  26.0 - 34.0 pg   MCHC 34.2  30.0 - 36.0 g/dL   RDW 11.9  14.7 - 82.9 %   Platelets 121 (*) 150 - 400 K/uL  URINALYSIS, ROUTINE W REFLEX MICROSCOPIC      Result Value Range   Color, Urine YELLOW  YELLOW   APPearance CLEAR  CLEAR   Specific Gravity, Urine 1.014  1.005 - 1.030   pH 7.5  5.0 - 8.0   Glucose, UA NEGATIVE  NEGATIVE mg/dL   Hgb urine dipstick NEGATIVE  NEGATIVE   Bilirubin Urine NEGATIVE  NEGATIVE   Ketones, ur NEGATIVE  NEGATIVE mg/dL   Protein, ur NEGATIVE  NEGATIVE mg/dL  Urobilinogen, UA 0.2  0.0 - 1.0 mg/dL   Nitrite NEGATIVE  NEGATIVE   Leukocytes, UA  NEGATIVE  NEGATIVE  TROPONIN I      Result Value Range   Troponin I <0.30  <0.30 ng/mL  COMPREHENSIVE METABOLIC PANEL      Result Value Range   Sodium 140  135 - 145 mEq/L   Potassium 3.6  3.5 - 5.1 mEq/L   Chloride 106  96 - 112 mEq/L   CO2 26  19 - 32 mEq/L   Glucose, Bld 117 (*) 70 - 99 mg/dL   BUN 10  6 - 23 mg/dL   Creatinine, Ser 0.98  0.50 - 1.10 mg/dL   Calcium 8.7  8.4 - 11.9 mg/dL   Total Protein 6.0  6.0 - 8.3 g/dL   Albumin 3.5  3.5 - 5.2 g/dL   AST 23  0 - 37 U/L   ALT 22  0 - 35 U/L   Alkaline Phosphatase 75  39 - 117 U/L   Total Bilirubin 0.3  0.3 - 1.2 mg/dL   GFR calc non Af Amer 80 (*) >90 mL/min   GFR calc Af Amer >90  >90 mL/min  CBC WITH DIFFERENTIAL      Result Value Range   WBC 2.3 (*) 4.0 - 10.5 K/uL   RBC 3.31 (*) 3.87 - 5.11 MIL/uL   Hemoglobin 9.9 (*) 12.0 - 15.0 g/dL   HCT 14.7 (*) 82.9 - 56.2 %   MCV 86.4  78.0 - 100.0 fL   MCH 29.9  26.0 - 34.0 pg   MCHC 34.6  30.0 - 36.0 g/dL   RDW 13.0  86.5 - 78.4 %   Platelets 109 (*) 150 - 400 K/uL   Neutrophils Relative 47  43 - 77 %   Neutro Abs 1.1 (*) 1.7 - 7.7 K/uL   Lymphocytes Relative 36  12 - 46 %   Lymphs Abs 0.8  0.7 - 4.0 K/uL   Monocytes Relative 12  3 - 12 %   Monocytes Absolute 0.3  0.1 - 1.0 K/uL   Eosinophils Relative 4  0 - 5 %   Eosinophils Absolute 0.1  0.0 - 0.7 K/uL   Basophils Relative 0  0 - 1 %   Basophils Absolute 0.0  0.0 - 0.1 K/uL  LACTIC ACID, PLASMA      Result Value Range   Lactic Acid, Venous 0.4 (*) 0.5 - 2.2 mmol/L  LACTATE DEHYDROGENASE      Result Value Range   LDH 240  94 - 250 U/L  POCT I-STAT, CHEM 8      Result Value Range   Sodium 140  135 - 145 mEq/L   Potassium 3.9  3.5 - 5.1 mEq/L   Chloride 103  96 - 112 mEq/L   BUN 12  6 - 23 mg/dL   Creatinine, Ser 6.96  0.50 - 1.10 mg/dL   Glucose, Bld 295 (*) 70 - 99 mg/dL   Calcium, Ion 2.84  1.32 - 1.23 mmol/L   TCO2 25  0 - 100 mmol/L   Hemoglobin 10.9 (*) 12.0 - 15.0 g/dL   HCT 44.0 (*) 10.2 - 72.5 %    Dg Chest 2 View  05/13/2012  *RADIOLOGY REPORT*  Clinical Data: Fever.  CHEST - 2 VIEW  Comparison: 03/26 and 03/18/2012  Findings: Power port in place.  Heart size and pulmonary vascularity are normal.  Lungs are clear.  No effusions.  No osseous abnormality.  IMPRESSION:  No acute disease in the chest.   Original Report Authenticated By: Francene Boyers, [name]    Ct Head Wo Contrast  05/13/2012  *RADIOLOGY REPORT*  Clinical Data: Headache.  Remote craniotomy for subdural hematoma.  CT HEAD WITHOUT CONTRAST  Technique:  Contiguous axial images were obtained from the base of the skull through the vertex without contrast.  Comparison: CT of the head performed 04/25/2012  Findings: There is no evidence of acute infarction, mass lesion, or intra- or extra-axial hemorrhage on CT.  The posterior fossa, including the cerebellum, brainstem and fourth ventricle, is within normal limits.  The third and lateral ventricles, and basal ganglia are unremarkable in appearance.  The cerebral hemispheres are symmetric in appearance, with normal gray- white differentiation.  No mass effect or midline shift is seen.  There is no evidence of fracture; the patient is status post occipital craniotomy.  The orbits are within normal limits.  A mucus retention cyst or polyp is noted within the right maxillary sinus, and an additional mucus retention cyst or polyp is noted within the sphenoid sinus.  The remaining paranasal sinuses and mastoid air cells are well-aerated.  No significant soft tissue abnormalities are seen.  IMPRESSION:  1.  No acute intracranial pathology seen on CT. 2.  Mucus retention cysts or polyps noted within the right maxillary sinus and sphenoid sinus.   Original Report Authenticated By: Tonia Ghent, [name]    Ct Head Wo Contrast  04/25/2012  *RADIOLOGY REPORT*  Clinical Data: Headache.  Nausea.  Generalized weakness.  CT HEAD WITHOUT CONTRAST  Technique:  Contiguous axial images were obtained from the base of  the skull through the vertex without contrast.  Comparison: 04/21/2012.  Findings: No mass lesion, mass effect, midline shift, hydrocephalus, hemorrhage.  No territorial ischemia or acute infarction.  Postoperative changes of occipital craniotomy.  IMPRESSION: No acute intracranial abnormality.   Original Report Authenticated By: Andreas Newport, [name]    Ct Angio Chest Pe W/cm &/or Wo Cm  05/13/2012  *RADIOLOGY REPORT*  Clinical Data: Left-sided chest pain.  CT ANGIOGRAPHY CHEST  Technique:  Multidetector CT imaging of the chest using the standard protocol during bolus administration of intravenous contrast. Multiplanar reconstructed images including MIPs were obtained and reviewed to evaluate the vascular anatomy.  Contrast: OMNIPAQUE IOHEXOL 350 MG/ML SOLN  Comparison: Chest radiograph performed earlier today at 12:21 a.m., and CTA of the chest performed 02/06/2012  Findings: There is no evidence of pulmonary embolus.  Minimal bibasilar atelectasis is noted.  The lungs are otherwise clear.  There is no evidence of significant focal consolidation, pleural effusion or pneumothorax.  No masses are identified; no abnormal focal contrast enhancement is seen.  No definite mediastinal lymphadenopathy is seen.  The patient's right chest port is noted ending about the cavoatrial junction.  No pericardial effusion is identified.  The great vessels are grossly unremarkable in appearance.  No axillary lymphadenopathy is seen. A vague 1.1 cm hypodensity is noted within the right thyroid lobe.  The visualized portions of the liver and spleen are unremarkable.  No acute osseous abnormalities are seen.  IMPRESSION:  1.  No evidence of pulmonary embolus. 2.  Minimal bibasilar atelectasis noted; lungs otherwise clear. 3.  Vague 1.1 cm hypodensity within the right thyroid lobe.  If not previously assessed, consider further evaluation with thyroid ultrasound.  If patient is clinically hyperthyroid, consider nuclear medicine  thyroid uptake and scan.   Original Report Authenticated By: Tonia Ghent, [name]    Mr Maxine Glenn  Head Wo Contrast  04/24/2012  This examination was performed at [facility]. The interpretation will be provided by [facility].  20 ml Multihance IV   Original Report Authenticated By: Janeece Riggers, [name]    04/24/2012    [facility]  NEUROIMAGING REPORT   STUDY DATE: 04/21/2012 PATIENT NAME: KALENA MANDER DOB: 1965/11/10 MRN: [id number]  ORDERING CLINICIAN: Lina Sayre, MD CLINICAL HISTORY: 47 year patient being evaluated for headaches  EXAM: MRV Brain TECHNIQUE: MRV Brain was performed using Time of flight sequences and reconstruction of images in coronal and sagittal planes CONTRAST: none IMAGING SITE: [facility]  FINDINGS:  The superior sagittal sinus shows normal flow in its entire extent. The right transverse sinus, sigmoid sinus and jugular vein show normal flow. The left transverse sinus appears to be absent. There is reduced flow in the left sigmoid sinus, jugular bulb and jugular vein and this may represent chronic hypoplasia versus sequelae of chronic occlusion and partial recanalization. The inferior sagittal sinus, straight vein of Galen and straight sinus appear normal. IMPRESSION:   This MRV shows absent flow in left transverse and diminished flow inleft sigmoid sinus and jugular vein which may represent congenital hypolasia or sequelae of chronic occlusion with partial recanalization.  INTERPRETING PHYSICIAN:  Delia Heady, MD Certified in Neurology,Vascular neurology and Neuroimaging  [facility] [address], [address] [place], [place] [address] [phone]     Mr Lodema Pilot Contrast  04/24/2012  This examination was performed at [facility] at [facility]. The interpretation will be provided by [facility].  20 ml Multihance IV   Original Report Authenticated By:  Janeece Riggers, [name]    04/24/2012    [facility]  NEUROIMAGING REPORT   STUDY DATE: 04/21/2012 PATIENT NAME: JAZLYNN NEMETZ DOB: 02/03/1965 MRN: [id number]  ORDERING CLINICIAN: Lina Sayre, MD CLINICAL HISTORY: 66 year lady being evaluated for headaches EXAM: MRI brain with and without contrast  TECHNIQUE:   MRI of the brain with and without contrast was obtained utilizing 5 mm axial slices with T1, T2, T2 flair, T2 star gradient echo and diffusion weighted views.  T1 sagittal, T2 coronal and postcontrast views in the axial and coronal plane were obtained. CONTRAST: Magnevist  IMAGING SITE: [facility]   FINDINGS:    No abnormal lesions are seen on diffusion-weighted views to suggest acute ischemia. The cortical sulci, fissures and cisterns are normal in size and appearance. Lateral, third and fourth ventricle are normal in size and appearance. No extra-axial fluid collections are seen. No evidence of mass effect or midline shift.  No abnormal lesions are seen on post contrast views.  On sagittal views the posterior fossa, pituitary gland and corpus callosum are unremarkable. No evidence of intracranial hemorrhage on gradient-echo views. The orbits and their contents,  and calvarium are unremarkable. Paranasal sinuses showed mild changes of chronic inflammation and mucosal polyps are noted in the right maxillary sinus and stenotic sinuses.  Intracranial flow voids are present. Contrast images do not show any abnormal areas of enhancement. The visualized portion of the upper cervical spine appears unremarkable   IMPRESSION:   This MRI scan of the brain appears within normal limits. Incidental mucosal polyps are noted in the right maxillary and sphenoid sinuses and changes of chronic paranasal sinusitis are seen .  INTERPRETING PHYSICIAN:  Delia Heady, MD Certified in Neurology,Vascular neurology and Neuroimaging  [facility] [address], [address] [place], [place] [address] [phone]    Dg Chest [place] 1 View  04/25/2012  *RADIOLOGY REPORT*  Clinical Data: Weakness and palpitations.  PORTABLE CHEST - 1 VIEW  Comparison: 03/18/2012.  Findings: The right IJ power port is stable.  The cardiac silhouette, mediastinal and hilar contours are normal.  The lungs are clear.  No pleural effusion.  The bony thorax is intact.  IMPRESSION: No acute cardiopulmonary findings.   Original Report Authenticated By: Rudie Meyer, [name]    IV fluids. Tylenol for fever. Neutropenia precautions initiated pending labs  MDM  Fever, neutropenic, ALL followed at [facility], IVFs, IV ABx  MED admit        Sunnie Nielsen, MD 05/14/12 [phone]

## 2012-05-13 NOTE — H&P (Signed)
[facility] History and Physical  Lynn [name] ZOX:[id number] DOB: 1965/11/17 DOA: 05/12/2012  Referring physician: Dr.Opitz. PCP: Benita Stabile, MD  Specialists: Dr Arline Asp Oncologist and patient also follows at [facility].  Chief Complaint: Fever.  HPI: Lynn [name] is a 47 y.o. female with history of ALL in remission, subdural hematoma requiring craniotomy, previous history of PE status post IVC filter placement off Coumadin after subdural hematoma presents with complaints of fever. Patient's fever started last afternoon 1 PM with chills. Denies any shortness of breath productive cough nausea vomiting abdominal pain diarrhea. Since the fever was persistent patient came to the ER. In the ER chest x-ray was unremarkable. UA is pending. Patient was found to be having a temperature of 100F in the ER. Patient has been admitted for further management. Patient has been having chronic headaches since her craniotomy. And recently 2 weeks ago patient had CT and MRI brain for a headache which was unremarkable and was started on Elavil and Tramoadol along with oxycodone. Patient also on sumatriptan. Patient has been having some left posterior lower aspect of the chest pain since afternoon. She feels her chest pain heavy. Patient otherwise is not in distress. At this time CT head and CT angiogram of the chest are pending.  Review of Systems: As presented in the history of presenting illness, rest negative.  Past Medical History  Diagnosis Date  . Hypertension   . ALL (acute lymphoblastic leukemia)   . Leukemia   . Brain bleed     07/04/11  . Pulmonary embolism     06/22/11   Past Surgical History  Procedure Laterality Date  . Cesarean section    . Foot surgery    . Foot surgery    . Cesarean section    . Eye surgery    . Craniotomy     Social History:  reports that she has never smoked. She has never used smokeless tobacco. She reports that she does not drink alcohol or  use illicit drugs. Lives at home. where does patient live-- Can do ADLs. Can patient participate in ADLs?  No Known Allergies  Family History  Problem Relation Age of Onset  . Cancer Father   . Cancer Mother     colon      Prior to Admission medications   Medication Sig Start Date End Date Taking? Authorizing Provider  acyclovir (ZOVIRAX) 400 MG tablet Take 800 mg by mouth 2 (two) times daily.    Yes Historical Provider, MD  amitriptyline (ELAVIL) 10 MG tablet Take 10 mg by mouth at bedtime.   Yes Historical Provider, MD  folic acid (FOLVITE) 1 MG tablet Take 1 mg by mouth daily.   Yes Historical Provider, MD  Magnesium 250 MG TABS Take 1 tablet by mouth daily.   Yes Historical Provider, MD  Multiple Vitamin (MULTIVITAMIN) tablet Take 1 tablet by mouth daily.   Yes Historical Provider, MD  ondansetron (ZOFRAN) 8 MG tablet Take 8 mg by mouth every 8 (eight) hours as needed for nausea.    Yes Historical Provider, MD  oxycodone (OXY-IR) 5 MG capsule Take 5 mg by mouth every 4 (four) hours as needed for pain.    Yes Historical Provider, MD  potassium chloride (MICRO-K) 10 MEQ CR capsule Take 20 mEq by mouth 2 (two) times daily.   Yes Historical Provider, MD  promethazine (PHENERGAN) 12.5 MG tablet Take 12.5 mg by mouth every 6 (six) hours as needed for nausea.   Yes Historical  Provider, MD  SUMAtriptan (IMITREX) 50 MG tablet Take 50 mg by mouth every 6 (six) hours as needed for migraine.   Yes Historical Provider, MD  lidocaine-prilocaine (EMLA) cream Apply 1 application topically as needed.    Historical Provider, MD  sulfamethoxazole-trimethoprim (BACTRIM DS) 800-160 MG per tablet Take 1 tablet by mouth See admin instructions. 1 tab BID on Mon, Wed and Fri    Historical Provider, MD  traMADol (ULTRAM) 50 MG tablet Take 50 mg by mouth every 8 (eight) hours as needed for pain.    Historical Provider, MD   Physical Exam: Filed Vitals:   05/12/12 2303 05/13/12 0106 05/13/12 0215  BP: 140/85     Pulse: 119  99  Temp: 100.3 F (37.9 C) 99.2 F (37.3 C)   TempSrc: Oral Oral   Resp: 21 20 18   Height: 5\' 5"  (1.651 m)    Weight: 108.863 kg (240 lb)    SpO2: 98%  99%     General:  Well-developed and nourished.  Eyes: Anicteric no pallor.  ENT: No discharge from ears eyes nose or mouth.  Neck: No neck rigidity or mass felt.  Cardiovascular: S1-S2 heard.  Respiratory: No rhonchi or crepitations.  Abdomen: Soft nontender bowel sounds present.  Skin: No rash.  Musculoskeletal: No edema.  Psychiatric: Appears normal.  Neurologic: Alert awake oriented to time place and person. Moves all extremities.  Labs on Admission:  Basic Metabolic Panel:  Recent Labs Lab 05/13/12 0027  NA 140  K 3.9  CL 103  GLUCOSE 108*  BUN 12  CREATININE 1.00   Liver Function Tests: No results found for this basename: AST, ALT, ALKPHOS, BILITOT, PROT, ALBUMIN,  in the last 168 hours No results found for this basename: LIPASE, AMYLASE,  in the last 168 hours No results found for this basename: AMMONIA,  in the last 168 hours CBC:  Recent Labs Lab 05/13/12 0018 05/13/12 0027  WBC 3.1*  --   HGB 10.4* 10.9*  HCT 30.4* 32.0*  MCV 86.6  --   PLT 121*  --    Cardiac Enzymes: No results found for this basename: CKTOTAL, CKMB, CKMBINDEX, TROPONINI,  in the last 168 hours  BNP (last 3 results) No results found for this basename: PROBNP,  in the last 8760 hours CBG: No results found for this basename: GLUCAP,  in the last 168 hours  Radiological Exams on Admission: Dg Chest 2 View  05/13/2012  *RADIOLOGY REPORT*  Clinical Data: Fever.  CHEST - 2 VIEW  Comparison: 03/26 and 03/18/2012  Findings: Power port in place.  Heart size and pulmonary vascularity are normal.  Lungs are clear.  No effusions.  No osseous abnormality.  IMPRESSION: No acute disease in the chest.   Original Report Authenticated By: Francene Boyers, [name]      Assessment/Plan Principal Problem:    Fever Active Problems:   Acute pulmonary embolism   Thrombocytopenia   Acute lymphoid leukemia in remission   1. Fever - source is not known. Blood cultures and urine cultures and UA has been ordered. Patient has been empirically started vancomycin and cefepime given that patient is on a study chemotherapy drug. A repeat CBC with differential has been ordered to check for differential count. Continue with hydration. Patient states her headache is not new and does not have any neck rigidity. 2. Left posterior chest pain - CT angiogram of the chest has been ordered. For now we will continue antibiotics and fluid hydration. We'll also check EKG  and cardiac enzyme. 3. Chronic headache - CT head is pending. Patient is nonfocal. 4. History of ALL in remission - consult patient's oncologist in a.m. 5. History of hypertension presently of medications. 6. History of PE status post IVC filter placement. 7. History of thrombocytopenia and anemia - looks chronic. Closely follow CBC with differential.    Code Status: Full code.  Family Communication:  none.  Disposition Plan: Admit to inpatient.    , N. [facility] Pager [phone].  If 7PM-7AM, please contact night-coverage [email or web address] Password [email or web address] 05/13/2012, 2:28 AM

## 2012-05-13 NOTE — Progress Notes (Signed)
I have seen and assessed patient and either Dr. Luna Glasgow recommended assessment and plan. Cultures are pending. Patient states chest pain is slightly improved. Patient still with headache. Will check a 2-D echo. Continue empiric IV antibiotics of vancomycin and cefepime. Oncology consultation for probable neutropenic fever.

## 2012-05-14 DIAGNOSIS — C9101 Acute lymphoblastic leukemia, in remission: Secondary | ICD-10-CM

## 2012-05-14 LAB — COMPREHENSIVE METABOLIC PANEL
ALT: 39 U/L — ABNORMAL HIGH (ref 0–35)
AST: 42 U/L — ABNORMAL HIGH (ref 0–37)
Albumin: 3.5 g/dL (ref 3.5–5.2)
CO2: 27 mEq/L (ref 19–32)
Calcium: 9.2 mg/dL (ref 8.4–10.5)
Sodium: 137 mEq/L (ref 135–145)
Total Protein: 6 g/dL (ref 6.0–8.3)

## 2012-05-14 LAB — RESPIRATORY VIRUS PANEL
Influenza A H3: NOT DETECTED
Influenza B: NOT DETECTED
Parainfluenza 1: NOT DETECTED
Respiratory Syncytial Virus A: NOT DETECTED
Respiratory Syncytial Virus B: NOT DETECTED
Rhinovirus: NOT DETECTED

## 2012-05-14 LAB — CBC WITH DIFFERENTIAL/PLATELET
Basophils Absolute: 0 10*3/uL (ref 0.0–0.1)
Basophils Relative: 1 % (ref 0–1)
Eosinophils Absolute: 0.2 10*3/uL (ref 0.0–0.7)
Hemoglobin: 10.2 g/dL — ABNORMAL LOW (ref 12.0–15.0)
Lymphocytes Relative: 39 % (ref 12–46)
MCHC: 34.3 g/dL (ref 30.0–36.0)
Neutrophils Relative %: 38 % — ABNORMAL LOW (ref 43–77)

## 2012-05-14 NOTE — Progress Notes (Signed)
INITIAL NUTRITION ASSESSMENT  DOCUMENTATION CODES Per approved criteria  -Obesity Unspecified   INTERVENTION: - Encouraged small frequent meals to improve appetite - Will continue to monitor   NUTRITION DIAGNOSIS: Inadequate oral intake related to poor appetite as evidenced by pt statement.   Goal: Pt to consume >90% of meals/supplements  Monitor:  Weights, labs, intake  Reason for Assessment: Nutrition risk  47 y.o. female  Admitting Dx: Fever  ASSESSMENT: Pt with history of acute lymphoblastic leukemia, currently in remission. Pt admitted with fever. Pt reports poor appetite PTA, just eating 2 meals/day. Pt reports 10 pound unintended weight loss in the past month. Pt reports having nausea PTA which she takes Phenergan at night for. Pt reports not wanting lunch today due to no appetite. Pt not interested in nutritional supplements.   Height: Ht Readings from Last 1 Encounters:  05/13/12 5\' 6"  (1.676 m)    Weight: Wt Readings from Last 1 Encounters:  05/13/12 240 lb (108.863 kg)    Ideal Body Weight: 130 lb  % Ideal Body Weight: 185  Wt Readings from Last 10 Encounters:  05/13/12 240 lb (108.863 kg)  04/10/12 245 lb 3.2 oz (111.222 kg)  03/18/12 245 lb (111.131 kg)  02/08/12 253 lb (114.76 kg)  12/07/11 247 lb 11.2 oz (112.356 kg)  10/27/11 256 lb 11.2 oz (116.438 kg)  08/17/11 262 lb 14.4 oz (119.251 kg)  07/20/11 264 lb (119.75 kg)  06/17/11 290 lb (131.543 kg)    Usual Body Weight: 250 lb per pt  % Usual Body Weight: 96  BMI:  Body mass index is 38.76 kg/(m^2). Class II obesity  Estimated Nutritional Needs: Kcal: 1500-1850 Protein: 65-80g Fluid: 1.5-1.8L/day  Skin: Intact  Diet Order: General  EDUCATION NEEDS: -No education needs identified at this time   Intake/Output Summary (Last 24 hours) at 05/14/12 1518 Last data filed at 05/14/12 1358  Gross per 24 hour  Intake    840 ml  Output      0 ml  Net    840 ml    Last BM: 4/13    Labs:   Recent Labs Lab 05/13/12 0027 05/13/12 0515 05/14/12 0910  NA 140 140 137  K 3.9 3.6 3.7  CL 103 106 103  CO2  --  26 27  BUN 12 10 6   CREATININE 1.00 0.85 0.89  CALCIUM  --  8.7 9.2  GLUCOSE 108* 117* 97    CBG (last 3)  No results found for this basename: GLUCAP,  in the last 72 hours  Scheduled Meds: . acyclovir  800 mg Oral BID  . amitriptyline  10 mg Oral QHS  . ceFEPime (MAXIPIME) IV  2 g Intravenous Q8H  . folic acid  1 mg Oral Daily  . magnesium oxide  200 mg Oral Daily  . multivitamin with minerals  1 tablet Oral Daily  . potassium chloride  20 mEq Oral BID  . sodium chloride  10-40 mL Intracatheter Q12H  . sodium chloride  3 mL Intravenous Q12H  . sulfamethoxazole-trimethoprim  1 tablet Oral Custom  . vancomycin  1,000 mg Intravenous Q12H    Continuous Infusions:   Past Medical History  Diagnosis Date  . Hypertension   . ALL (acute lymphoblastic leukemia)   . Leukemia   . Brain bleed     07/04/11  . Pulmonary embolism     06/22/11  . Headache   . Arthritis   . Anemia     Past Surgical History  Procedure  Laterality Date  . Cesarean section    . Foot surgery    . Foot surgery    . Cesarean section    . Eye surgery    . Craniotomy    . [place] filter       Levon Hedger MS, RD, LDN [phone] Pager [phone] After Hours Pager

## 2012-05-14 NOTE — Progress Notes (Signed)
[facility] PROGRESS NOTE  Lynn [name] GNF:[id number] DOB: 1965-04-20 DOA: 05/12/2012 PCP: Benita Stabile, MD  Assessment/Plan: #1 neutropenic fever/fever Questionable etiology. Chest x-ray CT chest negative for any acute infiltrates. Urinalysis is negative. Cultures with no growth to date. Patient currently afebrile. Patient with clinical improvement. Continue empiric IV vancomycin IV cefepime.  #2 left posterior chest pain CT angiogram of the chest negative EKG with normal sinus rhythm. Cardiac enzymes negative. 2-D echo with EF of 65-70%. Clinical improvement. Symptomatic treatment.  #3 chronic headaches CT head negative. Continue current regimen.  #4 ALL Per oncology.  #5 history of PE status post IVC filter placement.  #6 pancytopenia Likely secondary to recent chemotherapy. Chemotherapy on hold. Follow white count. If white count continues to drop may need Neulasta. Per oncology.  Code Status: Full Family Communication: Updated patient no family at bedside. Disposition Plan: Home when medically stable   Consultants:  Oncology: Dr. Arline Asp 05/13/2012  Procedures:  CT angio chest 05/13/2012  2-D echo 05/13/2012  Antibiotics:  IV vancomycin 4/14/ 2014  IV cefepime 4/14/ 2014  HPI/Subjective: Patient states feeling better. No headaches. No fevers overnight.  Objective: Filed Vitals:   05/13/12 2226 05/14/12 0530 05/14/12 1649 05/14/12 1819  BP: 98/61 105/66 118/71 121/63  Pulse: 87 73 91 77  Temp: 99.5 F (37.5 C) 98.6 F (37 C) 98 F (36.7 C) 98.2 F (36.8 C)  TempSrc: Oral Oral Oral Oral  Resp: 16 16 18 16   Height:      Weight:      SpO2: 100% 100% 98% 100%    Intake/Output Summary (Last 24 hours) at 05/14/12 1934 Last data filed at 05/14/12 1817  Gross per 24 hour  Intake   1080 ml  Output    500 ml  Net    580 ml   Filed Weights   05/12/12 2303 05/13/12 0330  Weight: 108.863 kg (240 lb) 108.863 kg (240 lb)     Exam:   General:  NAD  Cardiovascular: RRR  Respiratory: CTAB  Abdomen: Soft, nontender, nondistended, positive bowel sounds.  Extremities: No clubbing cyanosis or edema. Data Reviewed: Basic Metabolic Panel:  Recent Labs Lab 05/13/12 0027 05/13/12 0515 05/14/12 0910  NA 140 140 137  K 3.9 3.6 3.7  CL 103 106 103  CO2  --  26 27  GLUCOSE 108* 117* 97  BUN 12 10 6   CREATININE 1.00 0.85 0.89  CALCIUM  --  8.7 9.2   Liver Function Tests:  Recent Labs Lab 05/13/12 0515 05/14/12 0910  AST 23 42*  ALT 22 39*  ALKPHOS 75 84  BILITOT 0.3 0.2*  PROT 6.0 6.0  ALBUMIN 3.5 3.5   No results found for this basename: LIPASE, AMYLASE,  in the last 168 hours No results found for this basename: AMMONIA,  in the last 168 hours CBC:  Recent Labs Lab 05/13/12 0018 05/13/12 0027 05/13/12 0515 05/14/12 0910  WBC 3.1*  --  2.3* 1.9*  NEUTROABS  --   --  1.1* 0.8*  HGB 10.4* 10.9* 9.9* 10.2*  HCT 30.4* 32.0* 28.6* 29.7*  MCV 86.6  --  86.4 85.6  PLT 121*  --  109* 103*   Cardiac Enzymes:  Recent Labs Lab 05/13/12 0515  TROPONINI <0.30   BNP (last 3 results) No results found for this basename: PROBNP,  in the last 8760 hours CBG: No results found for this basename: GLUCAP,  in the last 168 hours  Recent Results (from the past 240 hour(s))  CULTURE, BLOOD (ROUTINE X 2)     Status: None   Collection Time    05/13/12 12:18 AM      Result Value Range Status   Specimen Description BLOOD PORT   Final   Special Requests BOTTLES DRAWN AEROBIC AND ANAEROBIC 4CC   Final   Culture  Setup Time 05/13/2012 08:57   Final   Culture     Final   Value:        BLOOD CULTURE RECEIVED NO GROWTH TO DATE CULTURE WILL BE HELD FOR 5 DAYS BEFORE ISSUING A FINAL NEGATIVE REPORT   Report Status PENDING   Incomplete  CULTURE, BLOOD (ROUTINE X 2)     Status: None   Collection Time    05/13/12 12:46 AM      Result Value Range Status   Specimen Description BLOOD RIGHT ANTECUBITAL    Final   Special Requests BOTTLES DRAWN AEROBIC AND ANAEROBIC 5CC   Final   Culture  Setup Time 05/13/2012 08:57   Final   Culture     Final   Value:        BLOOD CULTURE RECEIVED NO GROWTH TO DATE CULTURE WILL BE HELD FOR 5 DAYS BEFORE ISSUING A FINAL NEGATIVE REPORT   Report Status PENDING   Incomplete     Studies: Dg Chest 2 View  05/13/2012  *RADIOLOGY REPORT*  Clinical Data: Fever.  CHEST - 2 VIEW  Comparison: 03/26 and 03/18/2012  Findings: Power port in place.  Heart size and pulmonary vascularity are normal.  Lungs are clear.  No effusions.  No osseous abnormality.  IMPRESSION: No acute disease in the chest.   Original Report Authenticated By: Francene Boyers, [name]    Ct Head Wo Contrast  05/13/2012  *RADIOLOGY REPORT*  Clinical Data: Headache.  Remote craniotomy for subdural hematoma.  CT HEAD WITHOUT CONTRAST  Technique:  Contiguous axial images were obtained from the base of the skull through the vertex without contrast.  Comparison: CT of the head performed 04/25/2012  Findings: There is no evidence of acute infarction, mass lesion, or intra- or extra-axial hemorrhage on CT.  The posterior fossa, including the cerebellum, brainstem and fourth ventricle, is within normal limits.  The third and lateral ventricles, and basal ganglia are unremarkable in appearance.  The cerebral hemispheres are symmetric in appearance, with normal gray- white differentiation.  No mass effect or midline shift is seen.  There is no evidence of fracture; the patient is status post occipital craniotomy.  The orbits are within normal limits.  A mucus retention cyst or polyp is noted within the right maxillary sinus, and an additional mucus retention cyst or polyp is noted within the sphenoid sinus.  The remaining paranasal sinuses and mastoid air cells are well-aerated.  No significant soft tissue abnormalities are seen.  IMPRESSION:  1.  No acute intracranial pathology seen on CT. 2.  Mucus retention cysts or polyps  noted within the right maxillary sinus and sphenoid sinus.   Original Report Authenticated By: Tonia Ghent, [name]    Ct Angio Chest Pe W/cm &/or Wo Cm  05/13/2012  *RADIOLOGY REPORT*  Clinical Data: Left-sided chest pain.  CT ANGIOGRAPHY CHEST  Technique:  Multidetector CT imaging of the chest using the standard protocol during bolus administration of intravenous contrast. Multiplanar reconstructed images including MIPs were obtained and reviewed to evaluate the vascular anatomy.  Contrast: OMNIPAQUE IOHEXOL 350 MG/ML SOLN  Comparison: Chest radiograph performed earlier today at 12:21 a.m., and CTA of the  chest performed 02/06/2012  Findings: There is no evidence of pulmonary embolus.  Minimal bibasilar atelectasis is noted.  The lungs are otherwise clear.  There is no evidence of significant focal consolidation, pleural effusion or pneumothorax.  No masses are identified; no abnormal focal contrast enhancement is seen.  No definite mediastinal lymphadenopathy is seen.  The patient's right chest port is noted ending about the cavoatrial junction.  No pericardial effusion is identified.  The great vessels are grossly unremarkable in appearance.  No axillary lymphadenopathy is seen. A vague 1.1 cm hypodensity is noted within the right thyroid lobe.  The visualized portions of the liver and spleen are unremarkable.  No acute osseous abnormalities are seen.  IMPRESSION:  1.  No evidence of pulmonary embolus. 2.  Minimal bibasilar atelectasis noted; lungs otherwise clear. 3.  Vague 1.1 cm hypodensity within the right thyroid lobe.  If not previously assessed, consider further evaluation with thyroid ultrasound.  If patient is clinically hyperthyroid, consider nuclear medicine thyroid uptake and scan.   Original Report Authenticated By: Tonia Ghent, [name]     Scheduled Meds: . acyclovir  800 mg Oral BID  . amitriptyline  10 mg Oral QHS  . ceFEPime (MAXIPIME) IV  2 g Intravenous Q8H  . folic acid  1 mg  Oral Daily  . magnesium oxide  200 mg Oral Daily  . multivitamin with minerals  1 tablet Oral Daily  . potassium chloride  20 mEq Oral BID  . sodium chloride  10-40 mL Intracatheter Q12H  . sodium chloride  3 mL Intravenous Q12H  . sulfamethoxazole-trimethoprim  1 tablet Oral Custom  . vancomycin  1,000 mg Intravenous Q12H   Continuous Infusions:   Principal Problem:   Fever Active Problems:   Acute pulmonary embolism   Thrombocytopenia   Acute lymphoid leukemia in remission    Time spent: > 35 mins    [facility]  [facility] Pager [phone]. If 7PM-7AM, please contact night-coverage at [email or web address], password [facility] 05/14/2012, 7:34 PM  LOS: 2 days

## 2012-05-15 ENCOUNTER — Other Ambulatory Visit: Payer: Self-pay | Admitting: Oncology

## 2012-05-15 ENCOUNTER — Telehealth: Payer: Self-pay

## 2012-05-15 LAB — BASIC METABOLIC PANEL
BUN: 7 mg/dL (ref 6–23)
CO2: 30 mEq/L (ref 19–32)
Chloride: 101 mEq/L (ref 96–112)
Glucose, Bld: 90 mg/dL (ref 70–99)
Potassium: 4.6 mEq/L (ref 3.5–5.1)

## 2012-05-15 LAB — CBC
HCT: 29.3 % — ABNORMAL LOW (ref 36.0–46.0)
Hemoglobin: 10.3 g/dL — ABNORMAL LOW (ref 12.0–15.0)
MCH: 30.1 pg (ref 26.0–34.0)
MCHC: 35.2 g/dL (ref 30.0–36.0)
RBC: 3.42 MIL/uL — ABNORMAL LOW (ref 3.87–5.11)

## 2012-05-15 MED ORDER — LEVOFLOXACIN 500 MG PO TABS: 500.0000 mg | ORAL_TABLET | Freq: Every day | ORAL | Status: DC

## 2012-05-15 MED ORDER — HEPARIN SOD (PORK) LOCK FLUSH 100 UNIT/ML IV SOLN
500.0000 [IU] | INTRAVENOUS | Status: AC | PRN
  Administered 2012-05-15: 500 [IU]

## 2012-05-15 NOTE — Discharge Summary (Signed)
Physician Discharge Summary  Lynn [name] ZOX:[id number] DOB: 1965-04-24 DOA: 05/12/2012  PCP: Benita Stabile, MD  Admit date: 05/12/2012 Discharge date: 05/15/2012  Time spent: 65 minutes  Recommendations for Outpatient Follow-up:  1. Patient is to followup at Dr. Mamie Levers office on Friday, 05/17/2012 for labwork. 2. Patient is to followup with Dr. Kerri Perches at [facility] one week post discharge. 3. Patient is to followup with Dr. Arline Asp one week post discharge.  Discharge Diagnoses:  Principal Problem:   Fever Active Problems:   Acute pulmonary embolism   Thrombocytopenia   Acute lymphoid leukemia in remission   Discharge Condition: Stable and improved  Diet recommendation: Regular  Filed Weights   05/12/12 2303 05/13/12 0330  Weight: 108.863 kg (240 lb) 108.863 kg (240 lb)    History of present illness:  Lynn [name] is a 47 y.o. female with history of ALL in remission, subdural hematoma requiring craniotomy, previous history of PE status post IVC filter placement off Coumadin after subdural hematoma presents with complaints of fever. Patient's fever started last afternoon 1 PM with chills. Denies any shortness of breath productive cough nausea vomiting abdominal pain diarrhea. Since the fever was persistent patient came to the ER. In the ER chest x-ray was unremarkable. UA is pending. Patient was found to be having a temperature of 100F in the ER. Patient has been admitted for further management. Patient has been having chronic headaches since her craniotomy. And recently 2 weeks ago patient had CT and MRI brain for a headache which was unremarkable and was started on Elavil and Tramoadol along with oxycodone. Patient also on sumatriptan. Patient has been having some left posterior lower aspect of the chest pain since afternoon. She feels her chest pain heavy. Patient otherwise is not in distress. At this time CT head and CT angiogram of the chest are  pending   Hospital Course:  1 neutropenic fever/fever  Patient presented with fevers and chills 1 day prior to admission. Patient was admitted. Chest x-ray which was done was unremarkable. Urinalysis which was done was negative. CT angiogram of the chest which was done was also negative. Blood cultures were obtained with no growth to date. A respiratory virus panel was done which came back negative. Patient was started empirically on IV vancomycin and cefepime and followed. Patient improved clinically did not have any further fevers or chills and remained in stable condition. Patient had wanted to go home. Patient was discharged home on oral Levaquin for 5 more days to complete a one-week course of antibiotic therapy. Patient will followup at her oncologist office as outpatient. Patient will followup oncologist office on Friday, 05/17/2012 for CBC and be met. Patient is also to followup with Dr. Kerri Perches at [facility] the patient will be discharged in stable and improved condition.  #2 left posterior chest pain  On admission patient had complaints of left posterior chest pain. Chest x-ray which was done was negative. CT angiogram of the chest negative.  EKG with normal sinus rhythm. Cardiac enzymes negative. 2-D echo with EF of 65-70%. Patient improved clinically did not have any further pain on the discharged home in stable condition.   The rest of patient's chronic medical issues remained stable throughout the hospitalization patient be discharged in stable and improved condition.   Procedures: CT angio chest 05/13/2012  2-D echo 05/13/2012   Consultations: Oncology: Dr. Arline Asp 05/13/2012   Discharge Exam: Filed Vitals:   05/14/12 1819 05/14/12 2200 05/15/12 0200 05/15/12 0850  BP: 121/63  105/53 94/55 109/66  Pulse: 77 73 72 74  Temp: 98.2 F (36.8 C) 97.8 F (36.6 C) 98.1 F (36.7 C) 98.2 F (36.8 C)  TempSrc: Oral Oral Oral Oral  Resp: 16 18 18 20   Height:      Weight:       SpO2: 100% 100% 99% 99%    General: NAD Cardiovascular: RRR Respiratory: CTAB  Discharge Instructions  Discharge Orders   Future Appointments Provider Department Dept Phone   05/16/2012 10:15 AM Mauri [place] [facility] MED[facility]Y [phone]   06/06/2012 3:00 PM Micki Riley, MD [facility] [phone]   07/09/2012 9:30 AM Krista [facility] MEDICAL ONCOLOGY [phone]   07/09/2012 10:00 AM Samul Dada, MD [facility] MEDICAL ONCOLOGY [phone]   Future Orders Complete By Expires     Diet general  As directed     Discharge instructions  As directed     Comments:      Follow up at Dr Murinson's office for [facility] on Friday 05/17/12. Follow up with Dr Kerri Perches at [facility] in 1 week. Follow up with Dr Arline Asp in 1 week.    Increase activity slowly  As directed         Medication List    TAKE these medications       acyclovir 400 MG tablet  Commonly known as:  ZOVIRAX  Take 800 mg by mouth 2 (two) times daily.     amitriptyline 10 MG tablet  Commonly known as:  ELAVIL  Take 10 mg by mouth at bedtime.     folic acid 1 MG tablet  Commonly known as:  FOLVITE  Take 1 mg by mouth daily.     levofloxacin 500 MG tablet  Commonly known as:  LEVAQUIN  Take 1 tablet (500 mg total) by mouth daily. Take for 5 days then stop.     lidocaine-prilocaine cream  Commonly known as:  EMLA  Apply 1 application topically as needed.     Magnesium 250 MG Tabs  Take 1 tablet by mouth daily.     multivitamin tablet  Take 1 tablet by mouth daily.     ondansetron 8 MG tablet  Commonly known as:  ZOFRAN  Take 8 mg by mouth every 8 (eight) hours as needed for nausea.     oxycodone 5 MG capsule  Commonly known as:  OXY-IR  Take 5 mg by mouth every 4 (four) hours as needed for pain.     potassium chloride 10 MEQ CR capsule  Commonly known as:  MICRO-K  Take 20 mEq by mouth 2 (two) times daily.      promethazine 12.5 MG tablet  Commonly known as:  PHENERGAN  Take 12.5 mg by mouth every 6 (six) hours as needed for nausea.     sulfamethoxazole-trimethoprim 800-160 MG per tablet  Commonly known as:  BACTRIM DS  Take 1 tablet by mouth See admin instructions. 1 tab BID on Mon, Wed and Fri     SUMAtriptan 50 MG tablet  Commonly known as:  IMITREX  Take 50 mg by mouth every 6 (six) hours as needed for migraine.     traMADol 50 MG tablet  Commonly known as:  ULTRAM  Take 50 mg by mouth every 8 (eight) hours as needed for pain.           Follow-up Information   Follow up with Samul Dada, MD. Schedule an appointment  as soon as possible for a visit in 1 week.   Contact information:   [address] [place] [address] [phone]       Follow up with [facility] On 05/17/2012. (f/u for labwork)       Schedule an appointment as soon as possible for a visit in 1 week to follow up. (f/u with Dr Casimer Bilis in 1 week at [facility])        The results of significant diagnostics from this hospitalization (including imaging, microbiology, ancillary and laboratory) are listed below for reference.    Significant Diagnostic Studies: Dg Chest 2 View  05/13/2012  *RADIOLOGY REPORT*  Clinical Data: Fever.  CHEST - 2 VIEW  Comparison: 03/26 and 03/18/2012  Findings: Power port in place.  Heart size and pulmonary vascularity are normal.  Lungs are clear.  No effusions.  No osseous abnormality.  IMPRESSION: No acute disease in the chest.   Original Report Authenticated By: Francene Boyers, [name]    Ct Head Wo Contrast  05/13/2012  *RADIOLOGY REPORT*  Clinical Data: Headache.  Remote craniotomy for subdural hematoma.  CT HEAD WITHOUT CONTRAST  Technique:  Contiguous axial images were obtained from the base of the skull through the vertex without contrast.  Comparison: CT of the head performed 04/25/2012  Findings: There is no evidence of acute infarction, mass lesion, or  intra- or extra-axial hemorrhage on CT.  The posterior fossa, including the cerebellum, brainstem and fourth ventricle, is within normal limits.  The third and lateral ventricles, and basal ganglia are unremarkable in appearance.  The cerebral hemispheres are symmetric in appearance, with normal gray- white differentiation.  No mass effect or midline shift is seen.  There is no evidence of fracture; the patient is status post occipital craniotomy.  The orbits are within normal limits.  A mucus retention cyst or polyp is noted within the right maxillary sinus, and an additional mucus retention cyst or polyp is noted within the sphenoid sinus.  The remaining paranasal sinuses and mastoid air cells are well-aerated.  No significant soft tissue abnormalities are seen.  IMPRESSION:  1.  No acute intracranial pathology seen on CT. 2.  Mucus retention cysts or polyps noted within the right maxillary sinus and sphenoid sinus.   Original Report Authenticated By: Tonia Ghent, [name]    Ct Head Wo Contrast  04/25/2012  *RADIOLOGY REPORT*  Clinical Data: Headache.  Nausea.  Generalized weakness.  CT HEAD WITHOUT CONTRAST  Technique:  Contiguous axial images were obtained from the base of the skull through the vertex without contrast.  Comparison: 04/21/2012.  Findings: No mass lesion, mass effect, midline shift, hydrocephalus, hemorrhage.  No territorial ischemia or acute infarction.  Postoperative changes of occipital craniotomy.  IMPRESSION: No acute intracranial abnormality.   Original Report Authenticated By: Andreas Newport, [name]    Ct Angio Chest Pe W/cm &/or Wo Cm  05/13/2012  *RADIOLOGY REPORT*  Clinical Data: Left-sided chest pain.  CT ANGIOGRAPHY CHEST  Technique:  Multidetector CT imaging of the chest using the standard protocol during bolus administration of intravenous contrast. Multiplanar reconstructed images including MIPs were obtained and reviewed to evaluate the vascular anatomy.  Contrast:  OMNIPAQUE IOHEXOL 350 MG/ML SOLN  Comparison: Chest radiograph performed earlier today at 12:21 a.m., and CTA of the chest performed 02/06/2012  Findings: There is no evidence of pulmonary embolus.  Minimal bibasilar atelectasis is noted.  The lungs are otherwise clear.  There is no evidence of significant focal  consolidation, pleural effusion or pneumothorax.  No masses are identified; no abnormal focal contrast enhancement is seen.  No definite mediastinal lymphadenopathy is seen.  The patient's right chest port is noted ending about the cavoatrial junction.  No pericardial effusion is identified.  The great vessels are grossly unremarkable in appearance.  No axillary lymphadenopathy is seen. A vague 1.1 cm hypodensity is noted within the right thyroid lobe.  The visualized portions of the liver and spleen are unremarkable.  No acute osseous abnormalities are seen.  IMPRESSION:  1.  No evidence of pulmonary embolus. 2.  Minimal bibasilar atelectasis noted; lungs otherwise clear. 3.  Vague 1.1 cm hypodensity within the right thyroid lobe.  If not previously assessed, consider further evaluation with thyroid ultrasound.  If patient is clinically hyperthyroid, consider nuclear medicine thyroid uptake and scan.   Original Report Authenticated By: Tonia Ghent, [name]    Mr [facility] Wo Contrast  04/24/2012  This examination was performed at [facility] at [address] [facility]. The interpretation will be provided by [facility].  20 ml Multihance IV   Original Report Authenticated By: Janeece Riggers, [name]    04/24/2012    [facility]  NEUROIMAGING REPORT   STUDY DATE: 04/21/2012 PATIENT NAME: ERNIE SAGRERO DOB: 1965/07/02 MRN: [id number]  ORDERING CLINICIAN: Lina Sayre, MD CLINICAL HISTORY: 53 year patient being evaluated for headaches  EXAM: MRV Brain TECHNIQUE: MRV Brain was performed using Time of flight sequences and reconstruction of images in coronal and sagittal  planes CONTRAST: none IMAGING SITE: [facility]  FINDINGS:  The superior sagittal sinus shows normal flow in its entire extent. The right transverse sinus, sigmoid sinus and jugular vein show normal flow. The left transverse sinus appears to be absent. There is reduced flow in the left sigmoid sinus, jugular bulb and jugular vein and this may represent chronic hypoplasia versus sequelae of chronic occlusion and partial recanalization. The inferior sagittal sinus, straight vein of Galen and straight sinus appear normal. IMPRESSION:   This MRV shows absent flow in left transverse and diminished flow inleft sigmoid sinus and jugular vein which may represent congenital hypolasia or sequelae of chronic occlusion with partial recanalization.  INTERPRETING PHYSICIAN:  Delia Heady, MD Certified in Neurology,Vascular neurology and Neuroimaging  [facility] [address] [place], [place] [address] [phone]     Mr Lodema Pilot Contrast  04/24/2012  This examination was performed at [facility] at [facility]. The interpretation will be provided by [facility].  20 ml Multihance IV   Original Report Authenticated By: Janeece Riggers, [name]    04/24/2012    [facility]  NEUROIMAGING REPORT   STUDY DATE: 04/21/2012 PATIENT NAME: KAHLEAH CRASS DOB: 1965/11/20 MRN: [id number]  ORDERING CLINICIAN: Lina Sayre, MD CLINICAL HISTORY: 46 year lady being evaluated for headaches EXAM: MRI brain with and without contrast  TECHNIQUE:   MRI of the brain with and without contrast was obtained utilizing 5 mm axial slices with T1, T2, T2 flair, T2 star gradient echo and diffusion weighted views.  T1 sagittal, T2 coronal and postcontrast views in the axial and coronal plane were obtained. CONTRAST: Mag[place]vist  IMAGING SITE: [facility]   FINDINGS:    No abnormal lesions are seen on diffusion-weighted views to suggest acute ischemia. The cortical  sulci, fissures and cisterns are normal in size and appearance. Lateral, third and fourth ventricle are normal in size and appearance. No extra-axial fluid  collections are seen. No evidence of mass effect or midline shift.  No abnormal lesions are seen on post contrast views.  On sagittal views the posterior fossa, pituitary gland and corpus callosum are unremarkable. No evidence of intracranial hemorrhage on gradient-echo views. The orbits and their contents,  and calvarium are unremarkable. Paranasal sinuses showed mild changes of chronic inflammation and mucosal polyps are noted in the right maxillary sinus and stenotic sinuses.  Intracranial flow voids are present. Contrast images do not show any abnormal areas of enhancement. The visualized portion of the upper cervical spine appears unremarkable   IMPRESSION:   This MRI scan of the brain appears within normal limits. Incidental mucosal polyps are noted in the right maxillary and sphenoid sinuses and changes of chronic paranasal sinusitis are seen .  INTERPRETING PHYSICIAN:  Delia Heady, MD Certified in Neurology,Vascular neurology and Neuroimaging  [facility] [address] [place], [place] [address] [phone]    Dg Chest [place] 1 View  04/25/2012  *RADIOLOGY REPORT*  Clinical Data: Weakness and palpitations.  PORTABLE CHEST - 1 VIEW  Comparison: 03/18/2012.  Findings: The right IJ power port is stable.  The cardiac silhouette, mediastinal and hilar contours are normal.  The lungs are clear.  No pleural effusion.  The bony thorax is intact.  IMPRESSION: No acute cardiopulmonary findings.   Original Report Authenticated By: Rudie Meyer, [name]     Microbiology: Recent Results (from the past 240 hour(s))  CULTURE, BLOOD (ROUTINE X 2)     Status: None   Collection Time    05/13/12 12:18 AM      Result Value Range Status   Specimen Description BLOOD PORT   Final   Special Requests BOTTLES DRAWN AEROBIC AND ANAEROBIC 4CC    Final   Culture  Setup Time 05/13/2012 08:57   Final   Culture     Final   Value:        BLOOD CULTURE RECEIVED NO GROWTH TO DATE CULTURE WILL BE HELD FOR 5 DAYS BEFORE ISSUING A FINAL NEGATIVE REPORT   Report Status PENDING   Incomplete  CULTURE, BLOOD (ROUTINE X 2)     Status: None   Collection Time    05/13/12 12:46 AM      Result Value Range Status   Specimen Description BLOOD RIGHT ANTECUBITAL   Final   Special Requests BOTTLES DRAWN AEROBIC AND ANAEROBIC 5CC   Final   Culture  Setup Time 05/13/2012 08:57   Final   Culture     Final   Value:        BLOOD CULTURE RECEIVED NO GROWTH TO DATE CULTURE WILL BE HELD FOR 5 DAYS BEFORE ISSUING A FINAL NEGATIVE REPORT   Report Status PENDING   Incomplete  RESPIRATORY VIRUS PANEL     Status: None   Collection Time    05/13/12  3:56 PM      Result Value Range Status   Source - RVPAN NOSE   Final   Respiratory Syncytial Virus A NOT DETECTED   Final   Respiratory Syncytial Virus B NOT DETECTED   Final   Influenza A NOT DETECTED   Final   Influenza B NOT DETECTED   Final   Parainfluenza 1 NOT DETECTED   Final   Parainfluenza 2 NOT DETECTED   Final   Parainfluenza 3 NOT DETECTED   Final   Metapneumovirus NOT DETECTED   Final   Rhinovirus NOT DETECTED   Final   Adenovirus NOT  DETECTED   Final   Influenza A H1 NOT DETECTED   Final   Influenza A H3 NOT DETECTED   Final   Comment: (NOTE)           Normal Reference Range for each Analyte: NOT DETECTED     Testing performed using the Luminex xTAG Respiratory Viral Panel test     kit.     This test was developed and its performance characteristics determined     by [employer]. It has not been cleared or approved by the [place]     Food and Drug Administration. This test is used for clinical purposes.     It should not be regarded as investigational or for research. This     laboratory is certified under the Clinical Laboratory Improvement     Amendments of 1988 (CLIA) as qualified to  perform high complexity     clinical laboratory testing.     Labs: Basic Metabolic Panel:  Recent Labs Lab 05/13/12 0027 05/13/12 0515 05/14/12 0910 05/15/12 0630  NA 140 140 137 137  K 3.9 3.6 3.7 4.6  CL 103 106 103 101  CO2  --  26 27 30   GLUCOSE 108* 117* 97 90  BUN 12 10 6 7   CREATININE 1.00 0.85 0.89 0.66  CALCIUM  --  8.7 9.2 9.0   Liver Function Tests:  Recent Labs Lab 05/13/12 0515 05/14/12 0910  AST 23 42*  ALT 22 39*  ALKPHOS 75 84  BILITOT 0.3 0.2*  PROT 6.0 6.0  ALBUMIN 3.5 3.5   No results found for this basename: LIPASE, AMYLASE,  in the last 168 hours No results found for this basename: AMMONIA,  in the last 168 hours CBC:  Recent Labs Lab 05/13/12 0018 05/13/12 0027 05/13/12 0515 05/14/12 0910 05/15/12 0630  WBC 3.1*  --  2.3* 1.9* 1.8*  NEUTROABS  --   --  1.1* 0.8*  --   HGB 10.4* 10.9* 9.9* 10.2* 10.3*  HCT 30.4* 32.0* 28.6* 29.7* 29.3*  MCV 86.6  --  86.4 85.6 85.7  PLT 121*  --  109* 103* 94*   Cardiac Enzymes:  Recent Labs Lab 05/13/12 0515  TROPONINI <0.30   BNP: BNP (last 3 results) No results found for this basename: PROBNP,  in the last 8760 hours CBG: No results found for this basename: GLUCAP,  in the last 168 hours     Signed:  THOMPSON,DANIEL  [facility] 05/15/2012, 12:56 PM

## 2012-05-15 NOTE — Evaluation (Signed)
Physical Therapy One Time Evaluation Patient Details Name: Lynn [name] MRN: [id number] DOB: 27-Feb-1965 Today's Date: 05/15/2012 Time: 0912-0920 PT Time Calculation (min): 8 min  PT Assessment / Plan / Recommendation Clinical Impression  Pt is a 47 year old female admitted for neutropenic fever/fever.  Pt is currently independent with all mobility at this time and presents with no PT needs.  PT to sign off.    PT Assessment  Patent does not need any further PT services    Follow Up Recommendations  No PT follow up    Does the patient have the potential to tolerate intense rehabilitation      Barriers to Discharge        Equipment Recommendations  None recommended by PT    Recommendations for Other Services     Frequency      Precautions / Restrictions Precautions Precautions: None Restrictions Weight Bearing Restrictions: No   Pertinent Vitals/Pain N/a     Mobility  Bed Mobility Bed Mobility: Supine to Sit Supine to Sit: 7: Independent Transfers Transfers: Sit to Stand;Stand to Sit Sit to Stand: 7: Independent Stand to Sit: 7: Independent Ambulation/Gait Ambulation/Gait Assistance: 7: Independent Ambulation Distance (Feet): 350 Feet Assistive device: None Gait Pattern: Within Functional Limits    Exercises     PT Diagnosis:    PT Problem List:   PT Treatment Interventions:     PT Goals    Visit Information  Last PT Received On: 05/15/12 Assistance Needed: +1    Subjective Data  Subjective: I'm doing fine   Prior Functioning  Home Living Lives With: Son;Daughter Type of Home: House Home Access: Level entry Home Layout: One level Home Adaptive Equipment: None Prior Function Level of Independence: Independent Communication Communication: No difficulties    Cognition  Cognition Arousal/Alertness: Awake/alert Behavior During Therapy: WFL for tasks assessed/performed Overall Cognitive Status: Within Functional Limits for tasks assessed    Extremity/Trunk Assessment Right Upper Extremity Assessment RUE ROM/Strength/Tone: [facility] for tasks assessed Left Upper Extremity Assessment LUE ROM/Strength/Tone: WFL for tasks assessed Right Lower Extremity Assessment RLE ROM/Strength/Tone: [facility] for tasks assessed Left Lower Extremity Assessment LLE ROM/Strength/Tone: [facility] for tasks assessed Trunk Assessment Trunk Assessment: Normal   Balance    End of Session PT - End of Session Activity Tolerance: Patient tolerated treatment well Patient left: in bed;with call bell/phone within reach  GP     ,KATHrine E 05/15/2012, 11:30 AM Zenovia Jarred, PT, DPT 05/15/2012 Pager: [phone]

## 2012-05-15 NOTE — Progress Notes (Signed)
OT Cancellation Note  Patient Details Name: Lynn [name] MRN: [id number] DOB: [birth year]   Cancelled Treatment:    Reason Eval/Treat Not Completed: Other (comment) (pt declines OT needs. States she has been getting up to bathroom ok and feels she can manage ADL. Has family that can assist PRN.)  Lennox Laity [phone] 05/15/2012, 10:44 AM

## 2012-05-15 NOTE — Progress Notes (Signed)
Patient discharged home in stable condition.  Instructions and scripts given with verbal feedback and understanding.  No further questions at this time.

## 2012-05-15 NOTE — Telephone Encounter (Signed)
S/w pt that lab appt will be changed from 4/17 to 4/18. Pt asked for morning appt if possible and that we fax labs to Dr Casimer Bilis at [facility].

## 2012-05-16 ENCOUNTER — Other Ambulatory Visit: Payer: Medicaid Other | Admitting: Lab

## 2012-05-17 ENCOUNTER — Telehealth: Payer: Self-pay | Admitting: Medical Oncology

## 2012-05-17 ENCOUNTER — Telehealth: Payer: Self-pay | Admitting: Oncology

## 2012-05-17 ENCOUNTER — Other Ambulatory Visit: Payer: Self-pay | Admitting: Medical Oncology

## 2012-05-17 ENCOUNTER — Other Ambulatory Visit (HOSPITAL_BASED_OUTPATIENT_CLINIC_OR_DEPARTMENT_OTHER): Payer: Medicaid Other | Admitting: Lab

## 2012-05-17 ENCOUNTER — Encounter: Payer: Self-pay | Admitting: Oncology

## 2012-05-17 DIAGNOSIS — C9100 Acute lymphoblastic leukemia not having achieved remission: Secondary | ICD-10-CM

## 2012-05-17 LAB — CBC WITH DIFFERENTIAL/PLATELET
BASO%: 0.7 % (ref 0.0–2.0)
Eosinophils Absolute: 0.2 10*3/uL (ref 0.0–0.5)
HCT: 33.1 % — ABNORMAL LOW (ref 34.8–46.6)
LYMPH%: 41.5 % (ref 14.0–49.7)
MCHC: 33.9 g/dL (ref 31.5–36.0)
MCV: 87.2 fL (ref 79.5–101.0)
MONO%: 14.2 % — ABNORMAL HIGH (ref 0.0–14.0)
NEUT%: 34.6 % — ABNORMAL LOW (ref 38.4–76.8)
Platelets: 122 10*3/uL — ABNORMAL LOW (ref 145–400)
RBC: 3.79 10*6/uL (ref 3.70–5.45)

## 2012-05-17 NOTE — Telephone Encounter (Signed)
Pt called and per pt she was just d/c from hosp and need lb for Monday 4/21. Lb has already been scheduled and vm was left for pt. Called pt again and lmonvm re appt for 4/21.

## 2012-05-17 NOTE — Telephone Encounter (Signed)
I called pt and left a message that her white count is still low. Dr. Arline Asp would like for her to continue to hold the dasatinib. He would like to recheck a CBC on 05/20/12. The schedulers will call her with a time. Labs were also faxed to [facility] to Dr.McIver at [phone].

## 2012-05-17 NOTE — Progress Notes (Signed)
This patient was admitted to the hospital from 05/12/2012 through 05/15/2012 with fever and mild neutropenia. Her dasatinib is on hold. 2 blood cultures, a urinalysis and nasal swab for respiratory viruses were all negative. The patient was treated with vancomycin and cefepime empirically. Her fever resolved. Apparently she had had a similar episode of fever and mild neutropenia 2-3 weeks ago and was hospitalized at [facility].  CBC carried out today, 05/17/2012 shows a white count of 2.5 and an ANC of 0.9. Hemoglobin was 11.2 and platelets 122,000.  Patient was instructed to continue to hold dasatinib. We will check a CBC on Monday, 05/20/2012.

## 2012-05-19 LAB — CULTURE, BLOOD (ROUTINE X 2): Culture: NO GROWTH

## 2012-05-20 ENCOUNTER — Other Ambulatory Visit (HOSPITAL_BASED_OUTPATIENT_CLINIC_OR_DEPARTMENT_OTHER): Payer: Medicaid Other | Admitting: Lab

## 2012-05-20 ENCOUNTER — Telehealth: Payer: Self-pay | Admitting: Medical Oncology

## 2012-05-20 DIAGNOSIS — C9100 Acute lymphoblastic leukemia not having achieved remission: Secondary | ICD-10-CM

## 2012-05-20 LAB — CBC WITH DIFFERENTIAL/PLATELET
BASO%: 0.6 % (ref 0.0–2.0)
EOS%: 5.8 % (ref 0.0–7.0)
LYMPH%: 47.4 % (ref 14.0–49.7)
MCH: 29.3 pg (ref 25.1–34.0)
MCHC: 33.1 g/dL (ref 31.5–36.0)
MONO#: 0.4 10*3/uL (ref 0.1–0.9)
MONO%: 12.6 % (ref 0.0–14.0)
NEUT%: 33.6 % — ABNORMAL LOW (ref 38.4–76.8)
Platelets: 131 10*3/uL — ABNORMAL LOW (ref 145–400)
RBC: 3.65 10*6/uL — ABNORMAL LOW (ref 3.70–5.45)
WBC: 3.3 10*3/uL — ABNORMAL LOW (ref 3.9–10.3)

## 2012-05-20 NOTE — Telephone Encounter (Signed)
I called pt and left her a message that her white count is still low.Dr. Arline Asp would like to know if she is going to see Dr. Kerri Perches this week. If not he would like to recheck labs 05/23/12 and continue to hold the desatinib. I asked her to call me with any update.

## 2012-05-22 ENCOUNTER — Telehealth: Payer: Self-pay | Admitting: Oncology

## 2012-05-22 ENCOUNTER — Telehealth: Payer: Self-pay

## 2012-05-22 ENCOUNTER — Other Ambulatory Visit: Payer: Self-pay

## 2012-05-22 DIAGNOSIS — C9100 Acute lymphoblastic leukemia not having achieved remission: Secondary | ICD-10-CM

## 2012-05-22 NOTE — Telephone Encounter (Signed)
Pt called and stated the MD at [facility] restarted her desatinib today at a reduced dose of 50 mg rather than 100 mg. She will recheck labs at [facility] on 4/28 and again at [facility] on 5/2. This call was forwarded to scheduler for lab appt on 4/28.

## 2012-05-27 ENCOUNTER — Telehealth: Payer: Self-pay

## 2012-05-27 ENCOUNTER — Other Ambulatory Visit (HOSPITAL_BASED_OUTPATIENT_CLINIC_OR_DEPARTMENT_OTHER): Payer: Medicaid Other | Admitting: Lab

## 2012-05-27 DIAGNOSIS — C9100 Acute lymphoblastic leukemia not having achieved remission: Secondary | ICD-10-CM

## 2012-05-27 LAB — CBC WITH DIFFERENTIAL/PLATELET
BASO%: 0.6 % (ref 0.0–2.0)
EOS%: 2.9 % (ref 0.0–7.0)
HCT: 32.7 % — ABNORMAL LOW (ref 34.8–46.6)
MCH: 30.2 pg (ref 25.1–34.0)
MCHC: 34 g/dL (ref 31.5–36.0)
MONO#: 0.6 10*3/uL (ref 0.1–0.9)
RBC: 3.68 10*6/uL — ABNORMAL LOW (ref 3.70–5.45)
RDW: 13.4 % (ref 11.2–14.5)
WBC: 5.1 10*3/uL (ref 3.9–10.3)
lymph#: 2.6 10*3/uL (ref 0.9–3.3)

## 2012-05-27 NOTE — Telephone Encounter (Signed)
Faxed cbc/diff to [facility]

## 2012-06-06 ENCOUNTER — Ambulatory Visit (INDEPENDENT_AMBULATORY_CARE_PROVIDER_SITE_OTHER): Payer: Medicaid Other | Admitting: Neurology

## 2012-06-06 ENCOUNTER — Encounter: Payer: Self-pay | Admitting: Neurology

## 2012-06-06 ENCOUNTER — Other Ambulatory Visit: Payer: Self-pay | Admitting: *Deleted

## 2012-06-06 VITALS — BP 114/76 | HR 81 | Temp 99.0°F | Ht 66.0 in | Wt 248.0 lb

## 2012-06-06 DIAGNOSIS — R51 Headache: Secondary | ICD-10-CM

## 2012-06-06 NOTE — Progress Notes (Signed)
[facility] [address] [place]. [place] [address] [phone]       OFFICE FOLLOW-UP NOTE  [name] Date of Birth:  06-Sep-1965 Medical Record Number:  [id number]   HPI: 47 year old lady with new chronic daily headaches since June 2013 following intracerebral hemorrhage and treatment with chemotherapy for leukemia. She returns for followup after her initial consultation with me on 04/09/12. She did not tolerate Topamax as it did not help and hence stopped it after a few weeks. She in fact develop worsening headache and was hospitalized at [facility] with headache and fever he had Topamax was discontinued and she was started on amitriptyline 10 mg at night. She was also given Phenergan for nausea seems to be working quite well. She also states that the chemotherapy dose has been reduced and that may have helped as the headaches have practically disappeared. She hasn't had headache only one day in the last 4 weeks. She did discontinue the Imitrex , oxycodone and Fioricet. 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 Lab work done on 04/09/12 showed normal ESR, ANA panel, B12, blood chemistries. TSH was slightly suppressed at 0.438m iu/ml. ROS:   14 system review of systems is positive for headache and nausea only  PMH:  Past Medical History  Diagnosis Date  . Hypertension   . ALL (acute lymphoblastic leukemia)   . Leukemia   . Brain bleed     07/04/11  . Pulmonary embolism     06/22/11  . Headache   . Arthritis   . Anemia     Social History:  History   Social History  . Marital Status: Divorced    Spouse Name: N/A    Number of Children: 2  . Years of Education: Assoc   Occupational History  . Not on file.   Social History Main Topics  . Smoking status: Never Smoker   . Smokeless tobacco: Never Used   . Alcohol Use: No  . Drug Use: No  . Sexually Active: Not Currently    Birth Control/ Protection: Abstinence   Other Topics Concern  . Not on file   Social History Narrative   Pt lives with children.    Caffeine Use: 1 cup daily.    Medications:   Current Outpatient Prescriptions on File Prior to Visit  Medication Sig Dispense Refill  . acyclovir (ZOVIRAX) 400 MG tablet Take 800 mg by mouth 2 (two) times daily.       Marland Kitchen amitriptyline (ELAVIL) 10 MG tablet Take 10 mg by mouth at bedtime.      . folic acid (FOLVITE) 1 MG tablet Take 1 mg by mouth daily.      Marland Kitchen levofloxacin (LEVAQUIN) 500 MG tablet Take 1 tablet (500 mg total) by mouth daily. Take for 5 days then stop.  5 tablet  0  . lidocaine-prilocaine (EMLA) cream Apply 1 application topically as needed.      . Magnesium 250 MG TABS Take 1 tablet by mouth daily.      . Multiple Vitamin (MULTIVITAMIN) tablet Take 1 tablet by mouth daily.      . ondansetron (ZOFRAN) 8 MG tablet Take 8 mg by mouth every 8 (eight) hours as needed for nausea.       Marland Kitchen oxycodone (OXY-IR) 5 MG capsule Take 5 mg by mouth every 4 (four) hours  as needed for pain.       . potassium chloride (MICRO-K) 10 MEQ CR capsule Take 20 mEq by mouth 2 (two) times daily.      . promethazine (PHENERGAN) 12.5 MG tablet Take 12.5 mg by mouth every 6 (six) hours as needed for nausea.      Marland Kitchen sulfamethoxazole-trimethoprim (BACTRIM DS) 800-160 MG per tablet Take 1 tablet by mouth See admin instructions. 1 tab BID on Mon, Wed and Fri      . SUMAtriptan (IMITREX) 50 MG tablet Take 50 mg by mouth every 6 (six) hours as needed for migraine.      . traMADol (ULTRAM) 50 MG tablet Take 50 mg by mouth every 8 (eight) hours as needed for pain.       No current facility-administered medications on file prior to visit.    Allergies:  No Known Allergies Filed Vitals:   06/06/12 1537  BP: 114/76  Pulse: 81  Temp: 99 F (37.2 C)    Physical Exam General: Mildly obese middle-aged  African American lady, seated, in no evident distress Head: head normocephalic and atraumatic. Orohparynx benign Neck: supple with no carotid or supraclavicular bruits Cardiovascular: regular rate and rhythm, no murmurs Musculoskeletal: no deformity Skin:  no rash/petichiae Vascular:  Normal pulses all extremities  Neurologic Exam Mental Status: Awake and fully alert. Oriented to place and time. Recent and remote memory intact. Attention span, concentration and fund of knowledge appropriate. Mood and affect appropriate.  Cranial Nerves: Fundoscopic exam reveals sharp disc margins. Pupils equal, briskly reactive to light. Extraocular movements full without nystagmus. Visual fields full to confrontation. Hearing intact. Facial sensation intact. Face, tongue, palate moves normally and symmetrically.  Motor: Normal bulk and tone. Normal strength in all tested extremity muscles. Sensory.: intact to tough and pinprick and vibratory.  Coordination: Rapid alternating movements normal in all extremities. Finger-to-nose and heel-to-shin performed accurately bilaterally. Gait and Station: Arises from chair without difficulty. Stance is normal. Gait demonstrates normal stride length and balance . Able to heel, toe and tandem walk without difficulty.  Reflexes: 1+ and symmetric. Toes downgoing.     ASSESSMENT: 47 year old lady with new chronic headaches since June 2013 which seems to have improved over the last couple of months. Exact etiology remains unclear but likely mixed transforming migraine and tension headaches with possible contribution from chemotherapy.    PLAN: Continue amitriptyline 10 mg at night for headache prophylaxis that it seems to be tolerated and working well. Continue to use Phenergan as needed for nausea. I discussed the results of the MRI scans and lab work with the patient and answered questions. Keep a strict headache diary. Return for followup in 3 months or call earlier if  necessary. Followup with her primary physician for borderline hyperthyroidism.

## 2012-06-06 NOTE — Patient Instructions (Signed)
Continue amitryptylline 10 mg at night for headache prophylaxis and phenargan prn for symptomatic relief.Keep a headache diary. F/u in 3 months.

## 2012-07-03 ENCOUNTER — Other Ambulatory Visit: Payer: Self-pay

## 2012-07-03 ENCOUNTER — Telehealth: Payer: Self-pay | Admitting: Oncology

## 2012-07-03 DIAGNOSIS — C9100 Acute lymphoblastic leukemia not having achieved remission: Secondary | ICD-10-CM

## 2012-07-03 NOTE — Telephone Encounter (Signed)
, °

## 2012-07-04 ENCOUNTER — Telehealth: Payer: Self-pay

## 2012-07-04 ENCOUNTER — Other Ambulatory Visit (HOSPITAL_BASED_OUTPATIENT_CLINIC_OR_DEPARTMENT_OTHER): Payer: Medicaid Other | Admitting: Lab

## 2012-07-04 DIAGNOSIS — C9100 Acute lymphoblastic leukemia not having achieved remission: Secondary | ICD-10-CM

## 2012-07-04 LAB — CBC WITH DIFFERENTIAL/PLATELET
Eosinophils Absolute: 0.2 10*3/uL (ref 0.0–0.5)
HCT: 35.2 % (ref 34.8–46.6)
LYMPH%: 54.7 % — ABNORMAL HIGH (ref 14.0–49.7)
MCHC: 34.1 g/dL (ref 31.5–36.0)
MCV: 86.3 fL (ref 79.5–101.0)
MONO#: 0.5 10*3/uL (ref 0.1–0.9)
MONO%: 12.8 % (ref 0.0–14.0)
NEUT#: 1 10*3/uL — ABNORMAL LOW (ref 1.5–6.5)
NEUT%: 27.3 % — ABNORMAL LOW (ref 38.4–76.8)
Platelets: 141 10*3/uL — ABNORMAL LOW (ref 145–400)
RBC: 4.08 10*6/uL (ref 3.70–5.45)
WBC: 3.8 10*3/uL — ABNORMAL LOW (ref 3.9–10.3)

## 2012-07-04 NOTE — Telephone Encounter (Signed)
Faxed labs from 07/04/12 to [facility] 

## 2012-07-09 ENCOUNTER — Telehealth: Payer: Self-pay | Admitting: Oncology

## 2012-07-09 ENCOUNTER — Encounter: Payer: Self-pay | Admitting: Oncology

## 2012-07-09 ENCOUNTER — Ambulatory Visit (HOSPITAL_BASED_OUTPATIENT_CLINIC_OR_DEPARTMENT_OTHER): Payer: Medicaid Other | Admitting: Oncology

## 2012-07-09 ENCOUNTER — Other Ambulatory Visit (HOSPITAL_BASED_OUTPATIENT_CLINIC_OR_DEPARTMENT_OTHER): Payer: Medicaid Other | Admitting: Lab

## 2012-07-09 VITALS — BP 131/80 | HR 91 | Temp 97.9°F | Resp 18 | Ht 66.0 in | Wt 246.0 lb

## 2012-07-09 DIAGNOSIS — C9101 Acute lymphoblastic leukemia, in remission: Secondary | ICD-10-CM

## 2012-07-09 DIAGNOSIS — E041 Nontoxic single thyroid nodule: Secondary | ICD-10-CM

## 2012-07-09 DIAGNOSIS — R718 Other abnormality of red blood cells: Secondary | ICD-10-CM

## 2012-07-09 DIAGNOSIS — D72829 Elevated white blood cell count, unspecified: Secondary | ICD-10-CM

## 2012-07-09 DIAGNOSIS — R590 Localized enlarged lymph nodes: Secondary | ICD-10-CM

## 2012-07-09 DIAGNOSIS — C9100 Acute lymphoblastic leukemia not having achieved remission: Secondary | ICD-10-CM

## 2012-07-09 DIAGNOSIS — E079 Disorder of thyroid, unspecified: Secondary | ICD-10-CM

## 2012-07-09 DIAGNOSIS — I2699 Other pulmonary embolism without acute cor pulmonale: Secondary | ICD-10-CM

## 2012-07-09 DIAGNOSIS — D649 Anemia, unspecified: Secondary | ICD-10-CM

## 2012-07-09 DIAGNOSIS — D696 Thrombocytopenia, unspecified: Secondary | ICD-10-CM

## 2012-07-09 LAB — CBC WITH DIFFERENTIAL/PLATELET
BASO%: 0.8 % (ref 0.0–2.0)
Basophils Absolute: 0 10*3/uL (ref 0.0–0.1)
EOS%: 3.8 % (ref 0.0–7.0)
HGB: 11.5 g/dL — ABNORMAL LOW (ref 11.6–15.9)
MCH: 29.7 pg (ref 25.1–34.0)
MCHC: 34.4 g/dL (ref 31.5–36.0)
RBC: 3.88 10*6/uL (ref 3.70–5.45)
RDW: 13.7 % (ref 11.2–14.5)
lymph#: 1.9 10*3/uL (ref 0.9–3.3)

## 2012-07-09 LAB — COMPREHENSIVE METABOLIC PANEL (CC13)
Alkaline Phosphatase: 79 U/L (ref 40–150)
CO2: 24 mEq/L (ref 22–29)
Creatinine: 1.1 mg/dL (ref 0.6–1.1)
Glucose: 109 mg/dl — ABNORMAL HIGH (ref 70–99)
Sodium: 141 mEq/L (ref 136–145)
Total Bilirubin: 0.24 mg/dL (ref 0.20–1.20)

## 2012-07-09 LAB — LACTATE DEHYDROGENASE (CC13): LDH: 208 U/L (ref 125–245)

## 2012-07-09 NOTE — Progress Notes (Signed)
This office note has been dictated.  #[id number]

## 2012-07-10 ENCOUNTER — Ambulatory Visit (HOSPITAL_COMMUNITY)
Admission: RE | Admit: 2012-07-10 | Discharge: 2012-07-10 | Disposition: A | Discharge status: Home / Self Care | Payer: Medicaid Other | Source: Ambulatory Visit | Attending: Oncology | Admitting: Oncology

## 2012-07-10 DIAGNOSIS — E041 Nontoxic single thyroid nodule: Secondary | ICD-10-CM | POA: Insufficient documentation

## 2012-07-10 NOTE — Progress Notes (Signed)
CC:   [name], MD [facility], DO, Fax [phone] Lynn [name]. Lynn [name], [name] Lynn Gardener, MD, [facility], [address] Lynn [name], [name], [name] [address]  Lynn P. Pearlean Brownie, MD  PROBLEM LIST:  1. B-cell acute lymphoblastic leukemia, Philadelphia chromosome  positive, t(9; 22) initially with 71% blasts seen on bone marrow  carried out on Jun 19, 2011. Subsequent bone marrow at [facility] on 06/22/2011 showed 98% blasts  and peripheral blood showed 71% blasts. The patient received  induction treatment with dasatinib (Sprycel) and Decadron. Her  hospital course was complicated by the development of a posterior  fossa subdural hematoma requiring a suboccipital craniotomy and  evacuation of the hematoma on 07/04/2011. The patient also had  neutropenic fever with negative cultures covered with broad-  spectrum antibiotics. She had some headaches, vaginal bleeding and  transaminitis. Admission to [facility] was from 06/21/2011 through 07/17/2011. Bone marrow carried  out on 07/06/2011 showed a hypocellular bone marrow with no  evidence for ALL.  The patient was enrolled on protocol CALGB 81191. That protocol  consisted of treatment with dasatinib along with chemotherapy. A bone  marrow carried out on 07/28/2011 showed pan hypoplasia with no evidence  for acute leukemia. Cytogenetics were normal. FISH studies were  negative for t(9; 22). PCR for small p190 in both the peripheral blood  and bone marrow returned negative at 0.000. On 08/08/2011 the patient  received high-dose methotrexate with leucovorin rescue. She also  received IV vincristine 2.0 mg and CNS prophylaxis with intrathecal  methotrexate/hydrocortisone. CSF was negative. On 09/18/2011 bone  marrow biopsy showed no evidence of ALL. PCR for BCR/ABL showed 0.00001  fusion events. It was felt that the patient had achieved a complete  hematologic  response. On 09/26/2011 mobilization therapy with  cytarabine/etoposide was started  The patient was admitted to the hospital from 11/14/2011 through  11/28/2011, at which time she received high-dose therapy and her  autologous stem cell transplant. She received melphalan on days -2 and  days -1. Day 0 was 11/16/2011. The patient also received G-CSF. She  had severe mucositis and dysphagia on 11/20/2011 which required morphine  by PCA. She had some fevers on 11/23/2011. She was treated with  antibiotics for prophylaxis. She had some headaches, but those have  subsequently resolved. Venous Dopplers of her lower extremities were  done bilaterally because of some swelling of her legs. These were  negative for blood clots. It will be recalled that the patient does  have an inferior vena cava Greenfield filter that was placed on  07/07/2011 for her prior history of pulmonary emboli. As stated, the  patient was discharged from [facility] on October 29th. Lynn [name] tells me that she was restarted on dasatinib 100 mg daily in mid November. Records from [facility] indicate that bone marrows carried out on 10/16/2011, 11/02/2011,  12/15/2011, 03/08/2012 and 05/31/2012 were negative for any signs of leukemia.   2. Pulmonary emboli involving the right upper and right lower lobes  with positive CT chest angiogram on 06/17/2011 and negative  Dopplers.  3. Development of subdural hematoma involving the posterior fossa when  the patient was hospitalized at [facility], status post suboccipital craniotomy and evacuation  of hematoma on 07/04/2011.  4. History of hypertension.  5. Morbid obesity.  6. Arthritis involving the right hip.  7. History  of palpitations.  8. History of migraine headaches.  9. Placement of inferior vena cava Greenfield filter on 07/07/2011.  10. Mild renal insufficiency.  11.  Possible 1.1 cm, hypodense lesion in the right thyroid lobe noted on CT angiogram of the chest from 05/13/2012. 12. Right-sided double lumen Port-A-Cath placed at [facility] on 07/12/2011.   MEDICATIONS:  Reviewed and recorded. Current Outpatient Prescriptions  Medication Sig Dispense Refill  . acyclovir (ZOVIRAX) 800 MG tablet 800 mg. Take 1 tablet (800 mg total) by mouth 2 times daily.      Marland Kitchen amitriptyline (ELAVIL) 10 MG tablet Take 10 mg by mouth at bedtime.      . dasatinib (SPRYCEL) 50 MG tablet Take 50 mg by mouth daily.      . folic acid (FOLVITE) 1 MG tablet Take 1 mg by mouth daily.      Marland Kitchen lidocaine-prilocaine (EMLA) cream Apply 1 application topically as needed.      . Magnesium 250 MG TABS Take 1 tablet by mouth daily.      . Multiple Vitamin (MULTIVITAMIN) tablet Take 1 tablet by mouth daily.      . ondansetron (ZOFRAN) 8 MG tablet Take 8 mg by mouth every 8 (eight) hours as needed for nausea.       Marland Kitchen oxycodone (OXY-IR) 5 MG capsule Take 5 mg by mouth every 4 (four) hours as needed for pain.       . potassium chloride (MICRO-K) 10 MEQ CR capsule Take 20 mEq by mouth 2 (two) times daily.      . promethazine (PHENERGAN) 12.5 MG tablet Take 12.5 mg by mouth every 6 (six) hours as needed for nausea.      Marland Kitchen sulfamethoxazole-trimethoprim (BACTRIM DS) 800-160 MG per tablet Take 1 tablet by mouth See admin instructions. 1 tab BID on Mon, Wed and Fri      . SUMAtriptan (IMITREX) 50 MG tablet Take 50 mg by mouth every 6 (six) hours as needed for migraine.      . traMADol (ULTRAM) 50 MG tablet Take 50 mg by mouth every 8 (eight) hours as needed for pain.       No current facility-administered medications for this visit.     TREATMENT PROGRAM:   Dasatinib 50 mg daily, which was started on 05/31/2012.  The patient had been on Dasatinib 100 mg daily at least since 12/15/2011. The dose was reduced because of neutropenia. The patient also received dasatinib  during induction treatment which dates back to May 2013.   IMMUNIZATIONS: Lynn [name] will be due for immunizations 6 months after high-  dose therapy.    SMOKING HISTORY: The patient has never smoked cigarettes.    HISTORY:  Lynn [name] was seen today for followup of her B-cell acute lymphoblastic leukemia.  Lynn [name] was last seen by [place] on 04/10/2012 and prior to that on 02/08/2012.  Lynn [name] was admitted to [facility] from 05/12/2012  through 05/15/2012 with fever and mild neutropenia, felt to be due to Dasatinib, which was placed on hold for about a week.  Blood cultures, urinalysis and a nasal swab for respiratory viruses were all negative at that time.  Lynn [name] was treated empirically while in the hospital with vancomycin and cefepime.  Her fever resolved.  She had had a similar episode of fever and neutropenia a few weeks prior to the admission here in [place] and was hospitalized at that time at [facility] has undergone repeat bone marrows, usually about every  3 months. She had a bone marrow on 03/08/2012 and again on 05/31/2012.  Bone marrows have been negative for leukemia.  Following her most recent bone marrow at [facility] on 05/31/2012, Lynn [name] has had some pain in the region of her left posterior iliac spine.  She apparently needed to go to the emergency room at [facility], where she had a CT scan that apparently showed some hemorrhage.  Lynn [name] has continued to have headaches, for which she is seeing Dr. Mittie Bodo.  She has not had any fever.  She has some dyspnea on exertion and has had CT chest angiogram to look for recurrent pulmonary emboli.  These have been negative.  The patient wants to enroll at [employer] in the fall.  Her daughter graduated high school.  Recently.  In general, Lynn [name] is doing quite well at the present time.  Her major complaint today is soreness where she had the bone marrow in her left pelvis.  PHYSICAL EXAMINATION:  Vital signs:  Weight  is 246 pounds, which is stable.  O2 saturation on room air at rest was 100%.  Height 5 feet 6 inches, body surface area 2.28 sq m.  Blood pressure today 131/80. Other vital signs are normal.  Lynn [name] wears glasses.  There is no scleral icterus.  Mouth and pharynx:  Are benign.  I could not appreciate any thyroid nodules.  Heart and lungs:  Are normal.  There is a double-lumen, right-sided Port-A-Cath that had been placed at [facility] on 07/12/2011.  No peripheral adenopathy palpable.  No axillary adenopathy.  Heart and lungs are normal.  Rhythm is regular.  Breasts: Were not examined.  Abdomen:  Obese, nontender, with no organomegaly or masses palpable.  Extremities:  No peripheral edema.  Neurologic:  Exam is normal.  No petechiae or purpura.  Palpation of the left posterior iliac spine area discloses some tenderness but no palpable masses or ecchymosis.  LABORATORY DATA:  White count 4.0, ANC 1.5, hemoglobin 11.5, hematocrit 33.5, platelets 161,000.  Chemistries from today essentially normal except for glucose of 109.  BUN 14, creatinine 1.1, albumin 3.9, LDH 208.  A TSH from 06/17/2011 was 0.662.  IMAGING STUDIES:  1. CT angiogram of the chest on 06/17/2011 showed right-sided  pulmonary emboli. There were mildly prominent mediastinal and  right hilar lymph nodes.  2. CT-guided iliac bone aspiration and core biopsy were carried out on  06/19/2011.  3. CT of the head without IV contrast on 07/23/2011 showed that the  patient was status post suboccipital craniotomy and right frontal  bur hole placement. Otherwise negative noncontrast CT appearance  of the brain.  4. Chest x-ray, 2 view, from 10/08/2011 was negative.  5. Chest x-ray, 2 view, on 02/06/2012 was negative.  6. CT angiogram of the chest on 02/06/2012 showed no evidence of pulmonary  embolism or any other acute abnormalities. Lungs were clear. 7. Chest x-ray, 2 view, from 03/18/2012, showed no active     cardiopulmonary  disease. 8. CT scan of the head without IV contrast on 03/18/2012 showed no     acute findings.  No evidence of intracranial hemorrhage.  There was     evidence of the previous sub occipital craniotomy and right frontal     burhole. 9. MR, MRA of the head without IV contrast on 04/21/2012 showed absent     flow in the left transverse sinus and diminished flow in the left     sigmoid sinus and jugular vein, which may represent  congenital     hypoplasia or the sequence of chronic occlusion with partial     recannulization 10. CT scan of the head without IV contrast on 04/25/2012 showed no     acute intracranial abnormality. 11. Chest x-ray, 2 view, on 05/13/2012 showed no acute disease in the     chest. 12. CT angiogram of the chest on 05/13/2012 showed no evidence of     pulmonary embolism.  There was minimal bibasilar atelectasis.     There was a vague 1.1-cm hypodensity within the right thyroid lobe.     Consider further evaluation with thyroid ultrasound.   IMPRESSION AND PLAN:  Lynn [name] continues to do well at the present time. She is currently on Dasatinib 50 mg daily.  Her most recent bone marrow from 05/31/2012 was negative for evidence of recurrent leukemia.  Lynn [name] tells me that she is seen at [facility] every month and has a bone marrow every 3 months.  There is consideration of trying to increase her dasatinib dose back up to 75 mg per day.  Because of the possible lesion in the right thyroid, we will go ahead and order an ultrasound of the thyroid.  We will plan to see Lynn [name] again in 6 months, at which time we will check CBC and chemistries.  Records from [place] were reviewed.    ______________________________ Samul Dada, [name] DSM/MEDQ  D:  07/09/2012  T:  07/09/2012  Job:  [id number]

## 2012-07-11 ENCOUNTER — Encounter: Payer: Self-pay | Admitting: Oncology

## 2012-07-11 NOTE — Progress Notes (Signed)
I reviewed this patient's thyroid ultrasound today. There is a 1.6 cm nodule in the inferior right lobe. It was recommended that a needle aspiration be carried out. This will need to be arranged with interventional radiology.

## 2012-07-12 ENCOUNTER — Other Ambulatory Visit: Payer: Self-pay

## 2012-07-12 ENCOUNTER — Telehealth: Payer: Self-pay

## 2012-07-12 DIAGNOSIS — E041 Nontoxic single thyroid nodule: Secondary | ICD-10-CM

## 2012-07-12 NOTE — Telephone Encounter (Signed)
Informed pt of nodule seen on thryoid and need for a biopsy. To expect a call from scheduler. Pt requested we let Dr Casimer Bilis at [facility] know. Ultrasound report faxed to Dr Casimer Bilis.

## 2012-07-15 ENCOUNTER — Telehealth: Payer: Self-pay | Admitting: Medical Oncology

## 2012-07-15 NOTE — Telephone Encounter (Signed)
Tammy from [facility] called to inform us that they have received the order for the biopsy of thyroid nodule. They will contact the pt with the appointment.

## 2012-07-18 ENCOUNTER — Other Ambulatory Visit (HOSPITAL_COMMUNITY)
Admission: RE | Admit: 2012-07-18 | Discharge: 2012-07-18 | Disposition: A | Discharge status: Home / Self Care | Payer: Medicaid Other | Source: Ambulatory Visit | Attending: Diagnostic Radiology | Admitting: Diagnostic Radiology

## 2012-07-18 ENCOUNTER — Ambulatory Visit
Admission: RE | Admit: 2012-07-18 | Discharge: 2012-07-18 | Disposition: A | Discharge status: Home / Self Care | Payer: Medicaid Other | Source: Ambulatory Visit | Attending: Oncology | Admitting: Oncology

## 2012-07-18 DIAGNOSIS — E049 Nontoxic goiter, unspecified: Secondary | ICD-10-CM | POA: Insufficient documentation

## 2012-07-18 DIAGNOSIS — E041 Nontoxic single thyroid nodule: Secondary | ICD-10-CM

## 2012-07-21 NOTE — Progress Notes (Signed)
Quick Note:  Please notify patient and call/fax these results to patient's doctors. ______ 

## 2012-07-22 ENCOUNTER — Telehealth: Payer: Self-pay | Admitting: Medical Oncology

## 2012-07-22 NOTE — Telephone Encounter (Signed)
I called pt to inform her that her thyroid nodule biopsy is negative. She was very relieved.

## 2012-07-29 ENCOUNTER — Telehealth: Payer: Self-pay | Admitting: *Deleted

## 2012-07-29 NOTE — Telephone Encounter (Signed)
Lynn [name] called reporting she has been sitting at her computer and began having shortness of breath around 11:00 am.   Describes having to take a deep breath at times because it is hard to catch her breath at times.  Reports B/P = 148/82 which is high for her.  Reports history of HTN and PE but not on any medications due to history of brain bleed.  Feels slightly congested.  Feels jittery and shaky.  Denies seasonal allergies, cough and feels she does not have a fever.  Has an annoying, nagging pan under her right rib.  Has eaten a late lunch.  No audible respiratory distress noted with this call but does say she is breathing faster with this conversation.  Says she will wait a while and go to ER if things progress.   Will notify providers.

## 2012-09-09 ENCOUNTER — Telehealth: Payer: Self-pay | Admitting: Neurology

## 2012-09-12 ENCOUNTER — Ambulatory Visit (INDEPENDENT_AMBULATORY_CARE_PROVIDER_SITE_OTHER): Payer: Medicaid Other | Admitting: Neurology

## 2012-09-12 ENCOUNTER — Encounter: Payer: Self-pay | Admitting: Neurology

## 2012-09-12 ENCOUNTER — Ambulatory Visit: Payer: Medicaid Other | Admitting: Neurology

## 2012-09-12 VITALS — BP 121/82 | HR 94 | Ht 66.0 in | Wt 249.0 lb

## 2012-09-12 DIAGNOSIS — R51 Headache: Secondary | ICD-10-CM

## 2012-09-12 NOTE — Progress Notes (Signed)
[facility] [address] [place]. [place] [address] [phone]       OFFICE FOLLOW-UP NOTE  Ms. LENNOX LEIKAM Date of Birth:  18-Nov-1965 Medical Record Number:  [id number]   HPI: 47 year old lady with new chronic daily headaches since June 2013 following intracerebral hemorrhage and treatment with chemotherapy for leukemia. She returns for followup after her initial consultation with me on 04/09/12. She did not tolerate Topamax as it did not help and hence stopped it after a few weeks. She in fact develop worsening headache and was hospitalized at [facility] with headache and fever he had Topamax was discontinued and she was started on amitriptyline 10 mg at night. She was also given Phenergan for nausea seems to be working quite well. She also states that the chemotherapy dose has been reduced and that may have helped as the headaches have practically disappeared. She hasn't had headache only one day in the last 4 weeks. She did discontinue the Imitrex , oxycodone and Fioricet. 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 Lab work done on 04/09/12 showed normal ESR, ANA panel, B12, blood chemistries. TSH was slightly suppressed at 0.443m iu/ml. 09/12/2012 She returns for followup after last visit on 06/06/12. She states she is doing well and has only occasional minor headaches off and on. She does take Imitrex which seems to help. She uses only once every 2 weeks or so. She continues to take amitriptyline 10 mg at night and seems to tolerate it well. She has had trouble sleeping in recent weeks.  She also has some minor sinus headaches her once a week which are not disabling. ROS:   14 system review of systems is positive for headache and nausea only  PMH:  Past Medical History  Diagnosis Date  . Hypertension   . ALL (acute  lymphoblastic leukemia)   . Leukemia   . Brain bleed     07/04/11  . Pulmonary embolism     06/22/11  . Headache(784.0)   . Arthritis   . Anemia     Social History:  History   Social History  . Marital Status: Divorced    Spouse Name: N/A    Number of Children: 2  . Years of Education: Assoc   Occupational History  . Not on file.   Social History Main Topics  . Smoking status: Never Smoker   . Smokeless tobacco: Never Used  . Alcohol Use: No  . Drug Use: No  . Sexual Activity: Not Currently    Birth Control/ Protection: Abstinence   Other Topics Concern  . Not on file   Social History Narrative   Pt lives with children.    Caffeine Use: 1 cup daily.    Medications:   Current Outpatient Prescriptions on File Prior to Visit  Medication Sig Dispense Refill  . acyclovir (ZOVIRAX) 800 MG tablet 800 mg. Take 1 tablet (800 mg total) by mouth 2 times daily.      Marland Kitchen amitriptyline (ELAVIL) 10 MG tablet Take 10 mg by mouth at bedtime.      . dasatinib (SPRYCEL) 50 MG tablet Take 50 mg by mouth daily.      . folic acid (FOLVITE) 1 MG tablet Take 1 mg by mouth daily.      Marland Kitchen lidocaine-prilocaine (EMLA) cream Apply 1 application topically as needed.      Marland Kitchen  Magnesium 250 MG TABS Take 1 tablet by mouth daily.      . Multiple Vitamin (MULTIVITAMIN) tablet Take 1 tablet by mouth daily.      . ondansetron (ZOFRAN) 8 MG tablet Take 8 mg by mouth every 8 (eight) hours as needed for nausea.       Marland Kitchen oxycodone (OXY-IR) 5 MG capsule Take 5 mg by mouth every 4 (four) hours as needed for pain.       . potassium chloride (MICRO-K) 10 MEQ CR capsule Take 20 mEq by mouth 2 (two) times daily.      . promethazine (PHENERGAN) 12.5 MG tablet Take 12.5 mg by mouth every 6 (six) hours as needed for nausea.      Marland Kitchen sulfamethoxazole-trimethoprim (BACTRIM DS) 800-160 MG per tablet Take 1 tablet by mouth See admin instructions. 1 tab BID on Mon, Wed and Fri      . SUMAtriptan (IMITREX) 50 MG tablet Take 50  mg by mouth every 6 (six) hours as needed for migraine.      . traMADol (ULTRAM) 50 MG tablet Take 50 mg by mouth every 8 (eight) hours as needed for pain.       No current facility-administered medications on file prior to visit.    Allergies:  No Known Allergies Filed Vitals:   09/12/12 1508  BP: 121/82  Pulse: 94    Physical Exam General: Mildly obese middle-aged African American lady, seated, in no evident distress Head: head normocephalic and atraumatic. Orohparynx benign Neck: supple with no carotid or supraclavicular bruits Cardiovascular: regular rate and rhythm, no murmurs Musculoskeletal: no deformity Skin:  no rash/petichiae Vascular:  Normal pulses all extremities  Neurologic Exam Mental Status: Awake and fully alert. Oriented to place and time. Recent and remote memory intact. Attention span, concentration and fund of knowledge appropriate. Mood and affect appropriate.  Cranial Nerves: Fundoscopic exam reveals sharp disc margins. Pupils equal, briskly reactive to light. Extraocular movements full without nystagmus. Visual fields full to confrontation. Hearing intact. Facial sensation intact. Face, tongue, palate moves normally and symmetrically.  Motor: Normal bulk and tone. Normal strength in all tested extremity muscles. Sensory.: intact to touch and pinprick and vibratory.  Coordination: Rapid alternating movements normal in all extremities. Finger-to-nose and heel-to-shin performed accurately bilaterally. Gait and Station: Arises from chair without difficulty. Stance is normal. Gait demonstrates normal stride length and balance . Able to heel, toe and tandem walk without difficulty.  Reflexes: 1+ and symmetric. Toes downgoing.     ASSESSMENT: 47 year old lady with new chronic headaches since June 2013 which seems to have improved over the last couple of months. 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    PLAN: Increase amitriptyline to 20 mg at night for headache prophylaxis that it seems to be tolerated and working well. Continue to use imitrex for headache and Phenergan as needed for nausea.Marland Kitchen Keep a strict headache diary. Return for followup in 3 months or call earlier if necessary. Followup with her primary physician for borderline hyperthyroidism.

## 2012-09-12 NOTE — Patient Instructions (Signed)
Increase Elavil to 20 mg at night for headache prophylaxis if tolerated. Continue Imitrex as needed for symptomatic relief. Return for followup in 3 months with Larita Fife, NP or call earlier if necessary

## 2012-11-28 ENCOUNTER — Other Ambulatory Visit: Payer: Self-pay

## 2012-11-29 ENCOUNTER — Telehealth: Payer: Self-pay | Admitting: *Deleted

## 2012-11-29 ENCOUNTER — Other Ambulatory Visit: Payer: Self-pay

## 2012-11-29 ENCOUNTER — Telehealth: Payer: Self-pay

## 2012-11-29 DIAGNOSIS — C9100 Acute lymphoblastic leukemia not having achieved remission: Secondary | ICD-10-CM

## 2012-11-29 NOTE — Telephone Encounter (Signed)
Per staff message, phone call and   POF I have scheduled appts.  I called and gave the daughter the appt for Monday. Told her to get calendar on Monday.MW

## 2012-11-29 NOTE — Telephone Encounter (Signed)
Ordered labs as noted on order form received from Ms. Pricilla Holm at [facility]. Left orders on Key board for RN to view 12-02-12.

## 2012-12-02 ENCOUNTER — Other Ambulatory Visit (HOSPITAL_BASED_OUTPATIENT_CLINIC_OR_DEPARTMENT_OTHER): Payer: Medicaid Other

## 2012-12-02 DIAGNOSIS — C9100 Acute lymphoblastic leukemia not having achieved remission: Secondary | ICD-10-CM

## 2012-12-02 LAB — CBC WITH DIFFERENTIAL/PLATELET
BASO%: 0.3 % (ref 0.0–2.0)
LYMPH%: 15.6 % (ref 14.0–49.7)
MCHC: 34.5 g/dL (ref 31.5–36.0)
MONO#: 1.4 10*3/uL — ABNORMAL HIGH (ref 0.1–0.9)
Platelets: 104 10*3/uL — ABNORMAL LOW (ref 145–400)
RBC: 3.93 10*6/uL (ref 3.70–5.45)
WBC: 2.9 10*3/uL — ABNORMAL LOW (ref 3.9–10.3)
nRBC: 0 % (ref 0–0)

## 2012-12-02 LAB — COMPREHENSIVE METABOLIC PANEL (CC13)
AST: 19 U/L (ref 5–34)
Albumin: 3.9 g/dL (ref 3.5–5.0)
Alkaline Phosphatase: 83 U/L (ref 40–150)
BUN: 10.5 mg/dL (ref 7.0–26.0)
Creatinine: 0.8 mg/dL (ref 0.6–1.1)
Potassium: 4.1 mEq/L (ref 3.5–5.1)

## 2012-12-03 ENCOUNTER — Telehealth: Payer: Self-pay

## 2012-12-03 ENCOUNTER — Ambulatory Visit (HOSPITAL_COMMUNITY)
Admission: RE | Admit: 2012-12-03 | Discharge: 2012-12-03 | Disposition: A | Discharge status: Home / Self Care | Payer: Medicaid Other | Source: Ambulatory Visit | Attending: Internal Medicine | Admitting: Internal Medicine

## 2012-12-03 ENCOUNTER — Other Ambulatory Visit: Payer: Self-pay

## 2012-12-03 DIAGNOSIS — R05 Cough: Secondary | ICD-10-CM | POA: Insufficient documentation

## 2012-12-03 DIAGNOSIS — C9100 Acute lymphoblastic leukemia not having achieved remission: Secondary | ICD-10-CM | POA: Insufficient documentation

## 2012-12-03 DIAGNOSIS — R059 Cough, unspecified: Secondary | ICD-10-CM | POA: Insufficient documentation

## 2012-12-03 DIAGNOSIS — R5081 Fever presenting with conditions classified elsewhere: Secondary | ICD-10-CM | POA: Insufficient documentation

## 2012-12-03 NOTE — Telephone Encounter (Signed)
s/w Delaney Meigs RN about productive cough, wheeze, fever on Sunday and Monday, faxed labs from 11/3 and CXR from 11/4. Tamra said she would get this information to Dr Rennis Harding at [facility] for further evaluation and management. This done per Dr Rosie Fate. Hazelyn informed of actions

## 2012-12-03 NOTE — Telephone Encounter (Signed)
Pt called stating she had fever Sunday and Monday, temp today 98.4. Has chest congestion and some wheezing. Yesterday unable to cough, today mucousy productive cough. Requesting CXR. Dr Rosie Fate ordered CXR and pt to come to [facility] for it. She is on Bactrim daily and acyclovir BID

## 2012-12-04 ENCOUNTER — Ambulatory Visit (HOSPITAL_COMMUNITY)
Admission: RE | Admit: 2012-12-04 | Discharge: 2012-12-04 | Disposition: A | Discharge status: Home / Self Care | Payer: Medicaid Other | Source: Ambulatory Visit | Attending: Internal Medicine | Admitting: Internal Medicine

## 2012-12-05 ENCOUNTER — Other Ambulatory Visit (HOSPITAL_BASED_OUTPATIENT_CLINIC_OR_DEPARTMENT_OTHER): Payer: Medicaid Other | Admitting: Lab

## 2012-12-05 ENCOUNTER — Other Ambulatory Visit: Payer: Self-pay

## 2012-12-05 DIAGNOSIS — C9100 Acute lymphoblastic leukemia not having achieved remission: Secondary | ICD-10-CM

## 2012-12-05 LAB — CBC WITH DIFFERENTIAL/PLATELET
Basophils Absolute: 0 10*3/uL (ref 0.0–0.1)
Eosinophils Absolute: 0 10*3/uL (ref 0.0–0.5)
HCT: 33.8 % — ABNORMAL LOW (ref 34.8–46.6)
HGB: 11.3 g/dL — ABNORMAL LOW (ref 11.6–15.9)
LYMPH%: 21.1 % (ref 14.0–49.7)
MONO#: 0.7 10*3/uL (ref 0.1–0.9)
NEUT#: 1.4 10*3/uL — ABNORMAL LOW (ref 1.5–6.5)
NEUT%: 51.5 % (ref 38.4–76.8)
Platelets: 160 10*3/uL (ref 145–400)
WBC: 2.8 10*3/uL — ABNORMAL LOW (ref 3.9–10.3)

## 2012-12-05 LAB — COMPREHENSIVE METABOLIC PANEL (CC13)
ALT: 23 U/L (ref 0–55)
Anion Gap: 13 mEq/L — ABNORMAL HIGH (ref 3–11)
BUN: 7.7 mg/dL (ref 7.0–26.0)
CO2: 23 mEq/L (ref 22–29)
Calcium: 10 mg/dL (ref 8.4–10.4)
Chloride: 108 mEq/L (ref 98–109)
Creatinine: 0.9 mg/dL (ref 0.6–1.1)

## 2012-12-06 ENCOUNTER — Telehealth: Payer: Self-pay | Admitting: *Deleted

## 2012-12-06 NOTE — Telephone Encounter (Signed)
returned pts call. lm informing her that i could not cancel her labs for 12/09/12 because she has another appt connected to the labs...td

## 2012-12-09 ENCOUNTER — Telehealth: Payer: Self-pay

## 2012-12-09 ENCOUNTER — Other Ambulatory Visit: Payer: Medicaid Other | Admitting: Lab

## 2012-12-09 NOTE — Telephone Encounter (Signed)
Confirmed with Wake that it was OK to cancel todays lab with possible blood transfusion. Pt has appt with Dr Rennis Harding on 11/12

## 2012-12-12 ENCOUNTER — Other Ambulatory Visit: Payer: Medicaid Other | Admitting: Lab

## 2012-12-13 ENCOUNTER — Encounter: Payer: Self-pay | Admitting: Nurse Practitioner

## 2012-12-13 ENCOUNTER — Ambulatory Visit (INDEPENDENT_AMBULATORY_CARE_PROVIDER_SITE_OTHER): Payer: Medicaid Other | Admitting: Nurse Practitioner

## 2012-12-13 VITALS — BP 114/79 | HR 106 | Ht 66.0 in | Wt 240.0 lb

## 2012-12-13 DIAGNOSIS — R51 Headache: Secondary | ICD-10-CM

## 2012-12-13 MED ORDER — SUMATRIPTAN SUCCINATE 50 MG PO TABS: 100.0000 mg | ORAL_TABLET | Freq: Once | ORAL | Status: DC

## 2012-12-13 MED ORDER — AMITRIPTYLINE HCL 10 MG PO TABS: 10.0000 mg | ORAL_TABLET | Freq: Every day | ORAL | Status: DC

## 2012-12-13 NOTE — Patient Instructions (Signed)
Continue amitriptyline 10 mg at night for headache prophylaxis that it seems to be tolerated and working well.  Continue to use imitrex for headache and Phenergan as needed for nausea. Keep a strict headache diary.  Return for followup in 6 months or call earlier if necessary.

## 2012-12-13 NOTE — Progress Notes (Signed)
[facility]  PATIENT: Lynn [name] DOB: Jan 29, 1966   REASON FOR VISIT: follow up HISTORY FROM: patient  HISTORY OF PRESENT ILLNESS: 47 year old lady with new chronic daily headaches since June 2013 following intracerebral hemorrhage and treatment with chemotherapy for leukemia.  She returns for followup after her initial consultation with me on 04/09/12. She did not tolerate Topamax as it did not help and hence stopped it after a few weeks. She in fact develop worsening headache and was hospitalized at [facility] with headache and fever he had Topamax was discontinued and she was started on amitriptyline 10 mg at night. She was also given Phenergan for nausea seems to be working quite well. She also states that the chemotherapy dose has been reduced and that may have helped as the headaches have practically disappeared. She hasn't had headache only one day in the last 4 weeks. She did discontinue the Imitrex , oxycodone and Fioricet. 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 Lab work done on 04/09/12 showed normal ESR, ANA panel, B12, blood chemistries. TSH was slightly suppressed at 0.480m iu/ml.   09/12/2012 (PS): She returns for followup after last visit on 06/06/12. She states she is doing well and has only occasional minor headaches off and on. She does take Imitrex which seems to help. She uses only once every 2 weeks or so. She continues to take amitriptyline 10 mg at night and seems to tolerate it well. She has had trouble sleeping in recent weeks. She also has some minor sinus headaches her once a week which are not disabling.   12/13/12 (LL): Lynn [name] returns for 3 month revisit for headaches.  She was recently hospitalized from 10/21/12 to 11/29/12 at [facility] for relapse of leukemia.  She was found in hospital to have  thin subdural hematomas along the falx, tentorium and cerebral convexities with a small volume of subarachnoid hemorrhage along the sulci of the temporal and frontal lobes.  She had another round of chemotherapy and allograft bone marrow transplant; her blood counts are improving.  She is not have many headaches, sometimes only sharp pain that is momentary.  She is tolerating Elavil well but did not increase to 10 mg for fear of drowsiness.  She takes Imitrex infrequently, but states it helps when she has to take it.  ROS:  14 system review of systems is positive for anemia, headache and nausea only  ALLERGIES: Allergies  Allergen Reactions  . Fluconazole Hives    HOME MEDICATIONS: Outpatient Prescriptions Prior to Visit  Medication Sig Dispense Refill  . acyclovir (ZOVIRAX) 800 MG tablet 800 mg. Take 1 tablet (800 mg total) by mouth 2 times daily.      Marland Kitchen lidocaine-prilocaine (EMLA) cream Apply 1 application topically as needed.      . Multiple Vitamin (MULTIVITAMIN) tablet Take 1 tablet by mouth daily.      . ondansetron (ZOFRAN) 8 MG tablet Take 8 mg by mouth every 8 (eight) hours as needed for nausea.       Marland Kitchen oxycodone (OXY-IR) 5 MG capsule Take 5 mg by mouth every 4 (four) hours as needed for pain.       . potassium chloride (MICRO-K) 10 MEQ CR capsule Take 20 mEq by mouth 2 (two) times daily.      . potassium chloride (MICRO-K) 10 MEQ CR capsule  20 mEq. Take 2 capsules (20 mEq total) by mouth 2 times daily.      . promethazine (PHENERGAN) 12.5 MG tablet Take 12.5 mg by mouth every 6 (six) hours as needed for nausea.      Marland Kitchen sulfamethoxazole-trimethoprim (BACTRIM DS) 800-160 MG per tablet Take 1 tablet by mouth See admin instructions. 1 tab BID on Mon, Wed and Fri      . sulfamethoxazole-trimethoprim (BACTRIM DS) 800-160 MG per tablet Take 1 tablet by mouth two times a day on Monday, Wednesday and Friday      . traMADol (ULTRAM) 50 MG tablet Take 50 mg by mouth every 8 (eight) hours as  needed for pain.      Marland Kitchen amitriptyline (ELAVIL) 10 MG tablet Take 10 mg by mouth at bedtime.      . Magnesium 250 MG TABS Take 1 tablet by mouth daily.      . SUMAtriptan (IMITREX) 50 MG tablet Take 100 mg by mouth every 2 (two) hours as needed. Take 1 tablet (50 mg total) by mouth every 6 (six) hours as needed for Migraine.      . dasatinib (SPRYCEL) 50 MG tablet Take 50 mg by mouth daily.      . folic acid (FOLVITE) 1 MG tablet Take 1 mg by mouth daily.      . SUMAtriptan (IMITREX) 50 MG tablet Take 50 mg by mouth every 6 (six) hours as needed for migraine.       No facility-administered medications prior to visit.    PAST MEDICAL HISTORY: Past Medical History  Diagnosis Date  . Hypertension   . ALL (acute lymphoblastic leukemia)   . Leukemia   . Brain bleed     07/04/11  . Pulmonary embolism     06/22/11  . Headache(784.0)   . Arthritis   . Anemia     PAST SURGICAL HISTORY: Past Surgical History  Procedure Laterality Date  . Cesarean section    . Foot surgery    . Foot surgery    . Cesarean section    . Eye surgery    . Craniotomy    . Greenfield filter      FAMILY HISTORY: Family History  Problem Relation Age of Onset  . Pancreatic cancer Father   . Cancer Mother     colon  . Pancreatic cancer Mother     SOCIAL HISTORY: History   Social History  . Marital Status: Divorced    Spouse Name: N/A    Number of Children: 2  . Years of Education: Assoc   Occupational History  . Not on file.   Social History Main Topics  . Smoking status: Never Smoker   . Smokeless tobacco: Never Used  . Alcohol Use: No  . Drug Use: No  . Sexual Activity: Not Currently    Birth Control/ Protection: Abstinence   Other Topics Concern  . Not on file   Social History Narrative   Pt lives with children.    Caffeine Use: 1 cup daily.     PHYSICAL EXAM  Filed Vitals:   12/13/12 1024  BP: 114/79  Pulse: 106  Height: 5\' 6"  (1.676 m)  Weight: 240 lb (108.863 kg)    Body mass index is 38.76 kg/(m^2).  Physical Exam  General: Mildly obese middle-aged [place] American lady, seated, in no evident distress  Head: head normocephalic and atraumatic. Orohparynx benign  Neck: supple with no carotid or supraclavicular bruits  Cardiovascular: regular rate and rhythm, no  murmurs  Musculoskeletal: no deformity  Skin: no rash/petichiae  Vascular: Normal pulses all extremities   Neurologic Exam  Mental Status: Awake and fully alert. Oriented to place and time. Recent and remote memory intact. Attention span, concentration and fund of knowledge appropriate. Mood and affect appropriate.  Cranial Nerves: Fundoscopic exam reveals sharp disc margins. Pupils equal, briskly reactive to light. Extraocular movements full without nystagmus. Visual fields full to confrontation. Hearing intact. Facial sensation intact. Face, tongue, palate moves normally and symmetrically.  Motor: Normal bulk and tone. Normal strength in all tested extremity muscles.  Sensory.: intact to touch and pinprick and vibratory.  Coordination: Rapid alternating movements normal in all extremities. Finger-to-nose and heel-to-shin performed accurately bilaterally.  Gait and Station: Arises from chair without difficulty. Stance is normal. Gait demonstrates normal stride length and balance . Able to heel, toe and tandem walk without difficulty.  Reflexes: 1+ and symmetric. Toes downgoing.   DIAGNOSTIC DATA (LABS, IMAGING, TESTING) - I reviewed patient records, labs, notes, testing and imaging myself where available.  Lab Results  Component Value Date   WBC 2.8* 12/05/2012   HGB 11.3* 12/05/2012   HCT 33.8* 12/05/2012   MCV 85.3 12/05/2012   PLT 160 12/05/2012      Component Value Date/Time   NA 143 12/05/2012 1219   NA 137 05/15/2012 0630   K 3.9 12/05/2012 1219   K 4.6 05/15/2012 0630   CL 106 07/09/2012 0949   CL 101 05/15/2012 0630   CO2 23 12/05/2012 1219   CO2 30 05/15/2012 0630   GLUCOSE 121  12/05/2012 1219   GLUCOSE 109* 07/09/2012 0949   GLUCOSE 90 05/15/2012 0630   BUN 7.7 12/05/2012 1219   BUN 7 05/15/2012 0630   CREATININE 0.9 12/05/2012 1219   CREATININE 0.66 05/15/2012 0630   CALCIUM 10.0 12/05/2012 1219   CALCIUM 9.0 05/15/2012 0630   PROT 7.5 12/05/2012 1219   PROT 6.0 05/14/2012 0910   ALBUMIN 3.8 12/05/2012 1219   ALBUMIN 3.5 05/14/2012 0910   AST 21 12/05/2012 1219   AST 42* 05/14/2012 0910   ALT 23 12/05/2012 1219   ALT 39* 05/14/2012 0910   ALKPHOS 86 12/05/2012 1219   ALKPHOS 84 05/14/2012 0910   BILITOT 0.32 12/05/2012 1219   BILITOT 0.2* 05/14/2012 0910   GFRNONAA >90 05/15/2012 0630   GFRAA >90 05/15/2012 0630   MRI BRAIN WITH AND WITHOUT CONTRAST, Nov 09, 2012  Thin subdural hematomas along the falx, tentorium and cerebral convexities with a small volume of subarachnoid hemorrhage along the sulci of the temporal and frontal lobes.  This appears generally unchanged from comparison.  No evidence of associated or nodular enhancement to suggest intracranial malignancy.  ASSESSMENT AND PLAN 47 year old lady with history of ALL with new chronic headaches since June 2013 which seems to have improved over the last couple of months. Exact etiology remains unclear but likely mixed transformed migraine and tension headaches with possible contribution from chemotherapy. Since last visit she has had relapse of ALL and has received chemotherpy again and allograft bone marrow; blood counts are improving.  Headaches are less frequent.  PLAN:  Continue amitriptyline 10 mg at night for headache prophylaxis that it seems to be tolerated and working well. Continue to use imitrex for headache and Phenergan as needed for nausea. Keep a strict headache diary. Return for followup in 6 months or call earlier if necessary.   Meds ordered this encounter  Medications  . amitriptyline (ELAVIL) 10 MG tablet  Sig: Take 1 tablet (10 mg total) by mouth at bedtime.    Dispense:  30 tablet    Refill:   6    Order Specific Question:  Supervising Provider    Answer:  Joycelyn Schmid R [[id number]]  . SUMAtriptan (IMITREX) 50 MG tablet    Sig: Take 2 tablets (100 mg total) by mouth once. May repeat x 1 in 2 hrs    Dispense:  10 tablet    Refill:  6    Order Specific Question:  Supervising Provider    Answer:  Joycelyn Schmid R [[id number]]   Tawny Asal , MSN, NP-C 12/13/2012, 11:08 AM [facility] [address], [address] [place], [place] [address] [phone]  Note: This document was prepared with digital dictation and possible smart phrase technology. Any transcriptional errors that result from this process are unintentional.

## 2013-01-09 ENCOUNTER — Ambulatory Visit (HOSPITAL_BASED_OUTPATIENT_CLINIC_OR_DEPARTMENT_OTHER): Payer: Medicaid Other | Admitting: Internal Medicine

## 2013-01-09 ENCOUNTER — Other Ambulatory Visit (HOSPITAL_BASED_OUTPATIENT_CLINIC_OR_DEPARTMENT_OTHER): Payer: Medicaid Other

## 2013-01-09 VITALS — BP 122/82 | HR 90 | Temp 97.4°F | Resp 18 | Ht 66.0 in | Wt 244.9 lb

## 2013-01-09 DIAGNOSIS — Z95828 Presence of other vascular implants and grafts: Secondary | ICD-10-CM

## 2013-01-09 DIAGNOSIS — C9100 Acute lymphoblastic leukemia not having achieved remission: Secondary | ICD-10-CM

## 2013-01-09 DIAGNOSIS — Z9484 Stem cells transplant status: Secondary | ICD-10-CM

## 2013-01-09 DIAGNOSIS — E041 Nontoxic single thyroid nodule: Secondary | ICD-10-CM

## 2013-01-09 DIAGNOSIS — Z86711 Personal history of pulmonary embolism: Secondary | ICD-10-CM

## 2013-01-09 DIAGNOSIS — R51 Headache: Secondary | ICD-10-CM

## 2013-01-09 DIAGNOSIS — C9112 Chronic lymphocytic leukemia of B-cell type in relapse: Secondary | ICD-10-CM

## 2013-01-09 DIAGNOSIS — D649 Anemia, unspecified: Secondary | ICD-10-CM

## 2013-01-09 DIAGNOSIS — C9101 Acute lymphoblastic leukemia, in remission: Secondary | ICD-10-CM

## 2013-01-09 LAB — COMPREHENSIVE METABOLIC PANEL (CC13)
ALT: 17 U/L (ref 0–55)
AST: 20 U/L (ref 5–34)
Alkaline Phosphatase: 86 U/L (ref 40–150)
Anion Gap: 10 mEq/L (ref 3–11)
CO2: 27 mEq/L (ref 22–29)
Sodium: 143 mEq/L (ref 136–145)
Total Bilirubin: 0.42 mg/dL (ref 0.20–1.20)
Total Protein: 7 g/dL (ref 6.4–8.3)

## 2013-01-09 LAB — CBC WITH DIFFERENTIAL/PLATELET
BASO%: 1.1 % (ref 0.0–2.0)
Eosinophils Absolute: 0.2 10*3/uL (ref 0.0–0.5)
MCHC: 33.3 g/dL (ref 31.5–36.0)
MCV: 88.4 fL (ref 79.5–101.0)
MONO%: 13.5 % (ref 0.0–14.0)
NEUT#: 2.6 10*3/uL (ref 1.5–6.5)
RBC: 3.97 10*6/uL (ref 3.70–5.45)
RDW: 17.1 % — ABNORMAL HIGH (ref 11.2–14.5)
WBC: 4.2 10*3/uL (ref 3.9–10.3)

## 2013-01-09 LAB — HOLD TUBE, BLOOD BANK

## 2013-01-09 NOTE — Progress Notes (Signed)
I reviewed note and agree with plan.   VIKRAM R. PENUMALLI, MD  Certified in Neurology, Neurophysiology and Neuroimaging  [facility] [address], [address] [place], [place] [address] [phone]   

## 2013-01-09 NOTE — Patient Instructions (Signed)
Ponatinib oral tablets What is this medicine? PONATINIB (poe NA ti nib) is a chemotherapy drug. It targets specific proteins within cancer cells and stops the cancer cell from growing. This medicine is used to treat chronic myelogenous leukemia and certain types of acute lymphoblastic leukemia. This medicine may be used for other purposes; ask your health care provider or pharmacist if you have questions. COMMON BRAND NAME(S): Iclusig What should I tell my health care provider before I take this medicine? They need to know if you have any of these conditions: -bleeding disorders -diabetes -heart disease -high blood pressure -high cholesterol -history of blood clots -history of pancreatitis -history of stroke -liver disease -recent surgery -smoke tobacco -an unusual or allergic reaction to ponatinib, other medicines, foods, dyes, or preservatives -pregnant or trying to get pregnant -breast-feeding How should I use this medicine? Take this medicine by mouth with a glass of water. Follow the directions on the prescription label. You can take it with or without food. If it upsets your stomach, take it with food. Do not cut, crush, or chew this medicine. Do not take with grapefruit juice. Take your medicine at regular intervals. Do not take it more often than directed. Do not stop taking except on your doctor's advice. A special MedGuide will be given to you by the pharmacist with each prescription and refill. Be sure to read this information carefully each time. Talk to your pediatrician regarding the use of this medicine in children. Special care may be needed. Overdosage: If you think you've taken too much of this medicine contact a poison control center or emergency room at once. Overdosage: If you think you have taken too much of this medicine contact a poison control center or emergency room at once. NOTE: This medicine is only for you. Do not share this medicine with others. What if I  miss a dose? If you miss a dose, take it as soon as you can. If it is almost time for your next dose, take only that dose. Do not take double or extra doses. What may interact with this medicine? This medicine may interact with the following medications: -aliskiren -ambrisentan -boceprevir -carbamazepine -clarithromycin -colchicine -conivaptan -dabigatran -digoxin -everolimus -fexofenadine -grapefruit juice -imatinib -indinavir -irinotecan -itraconazole -ketoconazole -lapatinib -lopinavir/ritonavir -maraviroc -medicines for stomach problems like cimetidine, famotidine, omeprazole, lansoprazole -methotrexate -mitoxantrone -nefazodone -nelfinavir -nilotinib -phenytoin -posaconazole -ranolazine -rifampin -ritonavir -rosuvastatin -saquinavir -St. John's Wort -sulfasalazine -telaprevir -telithromycin -topotecan -voriconazole This list may not describe all possible interactions. Give your health care provider a list of all the medicines, herbs, non-prescription drugs, or dietary supplements you use. Also tell them if you smoke, drink alcohol, or use illegal drugs. Some items may interact with your medicine. What should I watch for while using this medicine? This drug may make you feel generally unwell. This is not uncommon, as chemotherapy can affect healthy cells as well as cancer cells. Report any side effects. Continue your course of treatment even though you feel ill unless your doctor tells you to stop. This medicine may increase your risk to bruise or bleed. Call your doctor or health care professional if you notice any unusual bleeding. Call your doctor or health care professional for advice if you get a fever, chills or sore throat, or other symptoms of a cold or flu. Do not treat yourself. This drug decreases your body's ability to fight infections. Try to avoid being around people who are sick. You may need blood work done while you are taking this medicine.  If  you are going to have surgery or any other procedures, tell your doctor you are taking this medicine. Do not become pregnant while taking this medicine. Women should inform their doctor if they wish to become pregnant or think they might be pregnant. There is a potential for serious side effects to an unborn child. Talk to your health care professional or pharmacist for more information. Do not breast-feed an infant while taking this medicine. What side effects may I notice from receiving this medicine? Side effects that you should report to your doctor or health care professional as soon as possible: -allergic reactions like skin rash, itching or hives, swelling of the face, lips, or tongue -bloody or black, tarry stools -breathing problems -changes in vision -chest pain or palpitations -confusion, trouble speaking or understanding -dark urine -dizziness -eye pain -fast, irregular heartbeat -feeling faint or lightheaded, falls -fever or chills -nausea, vomiting -pain, swelling, warmth in the leg -pain, tingling, numbness in the hands or feet -red or dark-brown urine -red spots on the skin -severe headache -spitting up blood or brown material that looks like coffee grounds -stomach pain -sudden numbness or weakness of the face, arm or leg -swelling in the ankles, feet, hands -tiredness -unusual bleeding or bruising -unusually slow heartbeat -yellowing of the eyes or skin  Side effects that usually do not require medical attention (Report these to your doctor or health care professional if they continue or are bothersome.): -constipation -joint pain -muscle pain This list may not describe all possible side effects. Call your doctor for medical advice about side effects. You may report side effects to FDA at 1-800-FDA-1088. Where should I keep my medicine? Keep out of the reach of children. Store between 20 and 25 degrees C (68 and 77 degrees F). Throw away any unused medicine  after the expiration date. NOTE: This sheet is a summary. It may not cover all possible information. If you have questions about this medicine, talk to your doctor, pharmacist, or health care provider.  2014, Elsevier/Gold Standard. (2012-01-23 15:16:26)

## 2013-01-09 NOTE — Progress Notes (Signed)
[facility] OFFICE PROGRESS NOTE  Lynn Carney, MD [address] [place] [address]  DIAGNOSIS: Acute lymphoid leukemia in remission  Anemia  Leukemia, acute lymphoid - Plan: CBC with Differential, Comprehensive metabolic panel, Lactate dehydrogenase  History of peripheral stem cell transplant  Right thyroid nodule  Port-a-cath in place  History of pulmonary embolism  Greenfield filter in place  Chief Complaint  Patient presents with  . Leukemia, acute lymphoid   CURRENT THERAPY:   Dasatinib 50 mg daily, which was started on 05/31/2012. The patient had been on Dasatinib 100 mg daily at least since 12/15/2011. The dose was reduced because of neutropenia. The patient also received dasatinib during induction treatment which dates back to May 2013.  She had relapse on Sept 22 and was hospitalized until Oct 31.  She was re-induced and is scheduled for ASCT on December 29 th at [facility] (Dr. Kerri Perches).  INTERVAL HISTORY: Lynn [name] 47 y.o. 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  She was last seen by Dr. Arline [name] on 07/09/2012.  [facility]  She reports relapse on September 22 nd with subsequent chemotherapy. She was recently hospitalized from 10/21/12 to 11/29/12 at [facility] for relapse of leukemia. She was found in hospital to have thin subdural hematomas along the falx, tentorium and cerebral convexities with a small volume of subarachnoid hemorrhage along the sulci of the temporal and frontal lobes. She had another round of chemotherapy and allograft bone marrow transplant is planned; her blood counts have recovered. She had a repeat Bone Marrow biopsy on October 27th which demonstrated hypocellular marrow with 5% blasts.  with a good report, per patient.  She remains on Ponatinib 45 mg daily since November 20th.  She has chronic headaches that are managed by neurology. In general, Lynn [name] is doing quite  well at the present time. She be admitted for ASCT on December 29th at [facility]  MEDICAL HISTORY: Past Medical History  Diagnosis Date  . Hypertension   . ALL (acute lymphoblastic leukemia)   . Leukemia   . Brain bleed     07/04/11  . Pulmonary embolism     06/22/11  . Headache(784.0)   . Arthritis   . Anemia     INTERIM HISTORY: has Microcytosis; Acute pulmonary embolism; Leukocytosis; Thrombocytopenia; Anemia; Hilar lymphadenopathy; Leukemia, acute lymphoid; Acute lymphoid leukemia; Acute lymphoid leukemia in remission; Transfusion reaction; Viral meningitis; Encounter for antineoplastic chemotherapy; Essential hypertension; History of peripheral stem cell transplant; Subdural hemorrhage; Neutropenia; Fever; Right thyroid nodule; Port-a-cath in place; History of pulmonary embolism; and Greenfield filter in place on her problem list.    ALLERGIES:  is allergic to fluconazole.  MEDICATIONS: has a current medication list which includes the following prescription(s): acyclovir, amitriptyline, cyclobenzaprine, lidocaine, lidocaine-prilocaine, magnesium chloride, multivitamin, ondansetron, pantoprazole, ponatinib hcl, potassium chloride, prochlorperazine, promethazine, sulfamethoxazole-trimethoprim, sumatriptan, and tramadol.  SURGICAL HISTORY:  Past Surgical History  Procedure Laterality Date  . Cesarean section    . Foot surgery    . Foot surgery    . Cesarean section    . Eye surgery    . Craniotomy    . Greenfield filter     PROBLEM LIST:  1. B-cell acute lymphoblastic leukemia, Philadelphia chromosome positive, t(9; 22) initially with 71% blasts seen on bone marrow carried out on Jun 19, 2011. Subsequent bone marrow at [facility] on 06/22/2011 showed 98% blasts and  peripheral blood showed 71% blasts. The patient received induction treatment with dasatinib (Sprycel) and Decadron. Her hospital course was complicated by the development of a posterior  fossa subdural hematoma requiring a suboccipital craniotomy and evacuation of the hematoma on 07/04/2011. The patient also had neutropenic fever with negative cultures covered with broad-spectrum antibiotics. She had some headaches, vaginal bleeding and transaminitis. Admission to [facility] was from 06/21/2011 through 07/17/2011. Bone marrow carried out on 07/06/2011 showed a hypocellular bone marrow with no evidence for ALL. The patient was enrolled on protocol CALGB 16109. That protocol consisted of treatment with dasatinib along with chemotherapy. A bone marrow carried out on 07/28/2011 showed pan hypoplasia with no evidence for acute leukemia. Cytogenetics were normal. FISH studies were negative for t(9; 22). PCR for small p190 in both the peripheral blood and bone marrow returned negative at 0.000. On 08/08/2011 the patient received high-dose methotrexate with leucovorin rescue. She also received IV vincristine 2.0 mg and CNS prophylaxis with intrathecal methotrexate/hydrocortisone. CSF was negative. On 09/18/2011 bone marrow biopsy showed no evidence of ALL. PCR for BCR/ABL showed 0.00001 fusion events. It was felt that the patient had achieved a complete hematologic response. On 09/26/2011 mobilization therapy with cytarabine/etoposide was started The patient was admitted to the hospital from 11/14/2011 through 11/28/2011, at which time she received high-dose therapy and her autologous stem cell transplant. She received melphalan on days -2 and days -1. Day 0 was 11/16/2011. The patient also received G-CSF. She had severe mucositis and dysphagia on 11/20/2011 which required morphine by PCA. She had some fevers on 11/23/2011. She was treated with antibiotics for prophylaxis. She had some headaches, but those have subsequently resolved. Venous Dopplers of her lower extremities were done bilaterally because of some swelling of her legs. These were negative for blood clots. It  will be recalled that the patient does have an inferior vena cava Greenfield filter that was placed on 07/07/2011 for her prior history of pulmonary emboli. As stated, the patient was discharged from [facility] on October 29th. Kymberley tells me that she was restarted on dasatinib 100 mg daily in mid November. Records from [facility] indicate that bone marrows carried out on 10/16/2011, 11/02/2011, 12/15/2011, 03/08/2012 and 05/31/2012 were negative for any signs of leukemia.  2. Pulmonary emboli involving the right upper and right lower lobes with positive CT chest angiogram on 06/17/2011 and negative Dopplers.  3. Development of subdural hematoma involving the posterior fossa when the patient was hospitalized at [facility], status post suboccipital craniotomy and evacuation of hematoma on 07/04/2011.  4. History of hypertension.  5. Morbid obesity.  6. Arthritis involving the right hip.  7. History of palpitations.  8. History of migraine headaches.  9. Placement of inferior vena cava Greenfield filter on 07/07/2011.  10. Mild renal insufficiency.  11. Possible 1.1 cm, hypodense lesion in the right thyroid lobe noted on CT angiogram of the chest from 05/13/2012.  12. Right-sided double lumen Port-A-Cath placed at [facility] on 07/12/2011.   REVIEW OF SYSTEMS:   Constitutional: Denies fevers, chills or abnormal weight loss Eyes: Denies blurriness of vision Ears, nose, mouth, throat, and face: Denies mucositis or sore throat Respiratory: Denies cough, dyspnea or wheezes Cardiovascular: Denies palpitation, chest discomfort or lower extremity swelling Gastrointestinal:  Denies nausea, heartburn or change in bowel habits Skin: Reports mild  skin rashes on face, back and  hands since December 3rd treated with steroid cream Lymphatics: Denies new lymphadenopathy or  easy bruising Neurological:Denies numbness, tingling or new weaknesses Behavioral/Psych: Mood is stable, no new changes  All other systems were reviewed with the patient and are negative.  PHYSICAL EXAMINATION: ECOG PERFORMANCE STATUS: 0 - Asymptomatic  Blood pressure 122/82, pulse 90, temperature 97.4 F (36.3 C), temperature source Oral, resp. rate 18, height 5\' 6"  (1.676 m), weight 244 lb 14.4 oz (111.086 kg), SpO2 97.00%.  GENERAL:alert, no distress and comfortable; chronically ill appearing woman with alopecia; moderately obese SKIN: skin color, texture, turgor are normal, no rashes or significant lesions EYES: normal, Conjunctiva are pink and non-injected, sclera clear OROPHARYNX:no exudate, no erythema and lips, buccal mucosa, and tongue normal  NECK: supple, thyroid normal size, non-tender, without nodularity LYMPH:  no palpable lymphadenopathy in the cervical, axillary or supraclavicular LUNGS: clear to auscultation and percussion with normal breathing effort HEART: regular rate & rhythm and no murmurs and no lower extremity edema ABDOMEN:abdomen soft, non-tender and normal bowel sounds Musculoskeletal:no cyanosis of digits and no clubbing  NEURO: alert & oriented x 3 with fluent speech, no focal motor/sensory deficits  LABORATORY DATA: No results found for this or any previous visit (from the past 48 hour(s)).   Labs:  Lab Results  Component Value Date   WBC 4.2 01/09/2013   HGB 11.7 01/09/2013   HCT 35.1 01/09/2013   MCV 88.4 01/09/2013   PLT 164 01/09/2013   NEUTROABS 2.6 01/09/2013      Chemistry      Component Value Date/Time   NA 143 01/09/2013 0853   NA 137 05/15/2012 0630   K 3.9 01/09/2013 0853   K 4.6 05/15/2012 0630   CL 106 07/09/2012 0949   CL 101 05/15/2012 0630   CO2 27 01/09/2013 0853   CO2 30 05/15/2012 0630   BUN 10.7 01/09/2013 0853   BUN 7 05/15/2012 0630   CREATININE 0.9 01/09/2013 0853   CREATININE 0.66 05/15/2012 0630      Component Value  Date/Time   CALCIUM 9.5 01/09/2013 0853   CALCIUM 9.0 05/15/2012 0630   ALKPHOS 86 01/09/2013 0853   ALKPHOS 84 05/14/2012 0910   AST 20 01/09/2013 0853   AST 42* 05/14/2012 0910   ALT 17 01/09/2013 0853   ALT 39* 05/14/2012 0910   BILITOT 0.42 01/09/2013 0853   BILITOT 0.2* 05/14/2012 0910     Basic Metabolic Panel:  Recent Labs Lab 01/09/13 0853  NA 143  K 3.9  CO2 27  GLUCOSE 95  BUN 10.7  CREATININE 0.9  CALCIUM 9.5  MG 1.9   GFR Estimated Creatinine Clearance: 96.5 ml/min (by C-G formula based on Cr of 0.9). Liver Function Tests:  Recent Labs Lab 01/09/13 0853  AST 20  ALT 17  ALKPHOS 86  BILITOT 0.42  PROT 7.0  ALBUMIN 3.9   CBC:  Recent Labs Lab 01/09/13 0852  WBC 4.2  NEUTROABS 2.6  HGB 11.7  HCT 35.1  MCV 88.4  PLT 164   Studies:  No results found.   RADIOGRAPHIC STUDIES: 1. CT angiogram of the chest on 06/17/2011 showed right-sided pulmonary emboli. There were mildly prominent mediastinal and right hilar lymph nodes.  2. CT-guided iliac bone aspiration and core biopsy were carried out on 06/19/2011.  3. CT of the head without IV contrast on 07/23/2011 showed that the patient was status post suboccipital craniotomy and right frontal bur hole placement. Otherwise negative noncontrast CT appearance of the brain.  4.  Chest x-ray, 2 view, from 10/08/2011 was negative.  5. Chest x-ray, 2 view, on 02/06/2012 was negative.  6. CT angiogram of the chest on 02/06/2012 showed no evidence of pulmonary embolism or any other acute abnormalities. Lungs were clear.  7. Chest x-ray, 2 view, from 03/18/2012, showed no active cardiopulmonary disease.  8. CT scan of the head without IV contrast on 03/18/2012 showed no acute findings. No evidence of intracranial hemorrhage. There was evidence of the previous sub occipital craniotomy and right frontal burhole.  9. 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 CT scan of the head without IV contrast on 04/25/2012 showed no acute intracranial abnormality.  11. Chest x-ray, 2 view, on 05/13/2012 showed no acute disease in the chest.  12. CT angiogram of the chest on 05/13/2012 showed no evidence of pulmonary embolism. There was minimal bibasilar atelectasis. There was a vague 1.1-cm hypodensity within the right thyroid lobe. Consider further evaluation with thyroid ultrasound. 13. Chest x-ray, 2 view, from 12/03/2012, demonstrated increased lung markings in the retrocardiac region on the left suggests subsegmental atelectasis or early infiltrate. There is no pleural effusion. No pulmonary parenchymal mass is demonstrated. There is no evidence of CHF.  ASSESSMENT: Lynn [name] 47 y.o. female with a history of Acute lymphoid leukemia in remission  Anemia  Leukemia, acute lymphoid - Plan: CBC with Differential, Comprehensive metabolic panel, Lactate dehydrogenase  History of peripheral stem cell transplant  Right thyroid nodule  Port-a-cath in place  History of pulmonary embolism  Greenfield filter in place   PLAN:   1. ALL, relapsed. --Patient has experienced relapsed disease as noted above.  She is scheduled for 2nd ASCT at [facility] on December 29th.   Her counts demonstrate recovery presently.    --She will continue on prophylaxis medications including acyclovir 800 mg bid, Bactrim DS on Mon, Wed, and Friday.  --She will continue ponatinib 45 mg for Ph+ disease.  She was provided a handout on the medication listing its side-effects and indications.  --She was instructed to contact us should she need any interval labs.  She will forward any neurological records and hematological records to our offices. --She has antiemetics ondansetron 8 mg prn and prochlorperazine 5 mg  prn and promethazine 12.5 mg q 6 hours prn.   2. Chronic headaches. --She is following with neurology.  Continue imitrex 100 mg prn.   3. GERD. --Continue pantoprazole 40 mg daily.   4. History of pulmonary emboli involving the right upper and right lower lobes with positive CT chest angiogram on 06/17/2011 and negative Dopplers.  --Placement of inferior vena cava Greenfield filter on 07/07/2011.   5. Development of subdural hematoma involving the posterior fossa when the patient was hospitalized at [facility], status post suboccipital craniotomy and evacuation of hematoma on 07/04/2011.   6. History of hypertension.  -- Observing of medication.   7. Follow-up --We will plan to see her again in 3 months at which time we will check her CBC, chemistries, LDH.  Previously she was treated according to protocol CALGB 16109.    All questions were answered. The patient knows to call the clinic with any problems, questions or concerns. We can certainly see the patient much sooner if necessary.  I spent 15 minutes counseling the patient face to face. The total time spent in the appointment was 25 minutes.    , , MD 01/11/2013 10:46 AM

## 2013-01-10 ENCOUNTER — Telehealth: Payer: Self-pay | Admitting: Internal Medicine

## 2013-01-10 NOTE — Telephone Encounter (Signed)
appts made per 12/11 POF tried to call pt but unable to get her Mailed March 2015 calendar shh

## 2013-01-11 DIAGNOSIS — Z95828 Presence of other vascular implants and grafts: Secondary | ICD-10-CM | POA: Insufficient documentation

## 2013-01-11 DIAGNOSIS — Z86711 Personal history of pulmonary embolism: Secondary | ICD-10-CM | POA: Insufficient documentation

## 2013-03-17 ENCOUNTER — Other Ambulatory Visit: Payer: Self-pay | Admitting: Medical Oncology

## 2013-03-17 ENCOUNTER — Ambulatory Visit (HOSPITAL_BASED_OUTPATIENT_CLINIC_OR_DEPARTMENT_OTHER): Payer: Medicaid Other

## 2013-03-17 DIAGNOSIS — C9100 Acute lymphoblastic leukemia not having achieved remission: Secondary | ICD-10-CM

## 2013-03-17 DIAGNOSIS — C9102 Acute lymphoblastic leukemia, in relapse: Secondary | ICD-10-CM

## 2013-03-17 LAB — COMPREHENSIVE METABOLIC PANEL (CC13)
ALK PHOS: 73 U/L (ref 40–150)
ALT: 47 U/L (ref 0–55)
AST: 23 U/L (ref 5–34)
Albumin: 3.6 g/dL (ref 3.5–5.0)
Anion Gap: 10 mEq/L (ref 3–11)
BILIRUBIN TOTAL: 0.22 mg/dL (ref 0.20–1.20)
BUN: 27.2 mg/dL — ABNORMAL HIGH (ref 7.0–26.0)
CO2: 21 mEq/L — ABNORMAL LOW (ref 22–29)
CREATININE: 1.6 mg/dL — AB (ref 0.6–1.1)
Calcium: 9 mg/dL (ref 8.4–10.4)
Chloride: 107 mEq/L (ref 98–109)
Glucose: 411 mg/dl — ABNORMAL HIGH (ref 70–140)
Potassium: 4.1 mEq/L (ref 3.5–5.1)
Sodium: 139 mEq/L (ref 136–145)
Total Protein: 6.3 g/dL — ABNORMAL LOW (ref 6.4–8.3)

## 2013-03-17 LAB — CBC WITH DIFFERENTIAL/PLATELET
BASO%: 0.5 % (ref 0.0–2.0)
Basophils Absolute: 0.1 10*3/uL (ref 0.0–0.1)
EOS%: 0.1 % (ref 0.0–7.0)
Eosinophils Absolute: 0 10*3/uL (ref 0.0–0.5)
HCT: 28 % — ABNORMAL LOW (ref 34.8–46.6)
HGB: 9.7 g/dL — ABNORMAL LOW (ref 11.6–15.9)
LYMPH%: 3.3 % — AB (ref 14.0–49.7)
MCH: 29.8 pg (ref 25.1–34.0)
MCHC: 34.6 g/dL (ref 31.5–36.0)
MCV: 85.9 fL (ref 79.5–101.0)
MONO#: 2.2 10*3/uL — ABNORMAL HIGH (ref 0.1–0.9)
MONO%: 9 % (ref 0.0–14.0)
NEUT#: 21.4 10*3/uL — ABNORMAL HIGH (ref 1.5–6.5)
NEUT%: 87.1 % — ABNORMAL HIGH (ref 38.4–76.8)
NRBC: 2 % — AB (ref 0–0)
PLATELETS: 179 10*3/uL (ref 145–400)
RBC: 3.26 10*6/uL — AB (ref 3.70–5.45)
RDW: 15.4 % — ABNORMAL HIGH (ref 11.2–14.5)
WBC: 24.6 10*3/uL — ABNORMAL HIGH (ref 3.9–10.3)
lymph#: 0.8 10*3/uL — ABNORMAL LOW (ref 0.9–3.3)

## 2013-03-17 LAB — MAGNESIUM (CC13): Magnesium: 1.6 mg/dl (ref 1.5–2.5)

## 2013-03-17 LAB — TECHNOLOGIST REVIEW

## 2013-03-18 ENCOUNTER — Telehealth: Payer: Self-pay | Admitting: Medical Oncology

## 2013-03-18 NOTE — Telephone Encounter (Signed)
I called [facility]-spoke with Jonelle Sidle- RN to inform them we faxed lab results and I also had called pt about elevated WBC and glucose. Pt did state she has been taking steroids and her WBC and glucose have been elevated. She is currently on a taper and states he numbers are improving. She also states that her BUN and creatinine have been elevated. Jonelle Sidle will call us if she needs to fax an further orders. Labs that are not resulted will be faxed when ready.

## 2013-03-20 LAB — TACROLIMUS LEVEL: Tacrolimus Lvl: 7.2 ng/mL (ref 5.0–20.0)

## 2013-03-20 LAB — CMV DNA, QUANTITATIVE, PCR: Cytomegalovirus, DNA Quant PCR: <363 copies/mL (ref <363)

## 2013-03-24 ENCOUNTER — Encounter (HOSPITAL_COMMUNITY): Payer: Self-pay | Admitting: Emergency Medicine

## 2013-03-24 ENCOUNTER — Emergency Department (HOSPITAL_COMMUNITY)
Admission: EM | Admit: 2013-03-24 | Discharge: 2013-03-24 | Disposition: A | Discharge status: Home / Self Care | Payer: Medicaid Other | Attending: Emergency Medicine | Admitting: Emergency Medicine

## 2013-03-24 DIAGNOSIS — E875 Hyperkalemia: Secondary | ICD-10-CM | POA: Insufficient documentation

## 2013-03-24 DIAGNOSIS — Z79899 Other long term (current) drug therapy: Secondary | ICD-10-CM | POA: Insufficient documentation

## 2013-03-24 DIAGNOSIS — Z862 Personal history of diseases of the blood and blood-forming organs and certain disorders involving the immune mechanism: Secondary | ICD-10-CM | POA: Insufficient documentation

## 2013-03-24 DIAGNOSIS — R5383 Other fatigue: Principal | ICD-10-CM

## 2013-03-24 DIAGNOSIS — Z86711 Personal history of pulmonary embolism: Secondary | ICD-10-CM | POA: Insufficient documentation

## 2013-03-24 DIAGNOSIS — R531 Weakness: Secondary | ICD-10-CM

## 2013-03-24 DIAGNOSIS — Z792 Long term (current) use of antibiotics: Secondary | ICD-10-CM | POA: Insufficient documentation

## 2013-03-24 DIAGNOSIS — R631 Polydipsia: Secondary | ICD-10-CM | POA: Insufficient documentation

## 2013-03-24 DIAGNOSIS — Z9481 Bone marrow transplant status: Secondary | ICD-10-CM | POA: Insufficient documentation

## 2013-03-24 DIAGNOSIS — R3589 Other polyuria: Secondary | ICD-10-CM | POA: Insufficient documentation

## 2013-03-24 DIAGNOSIS — E860 Dehydration: Secondary | ICD-10-CM | POA: Insufficient documentation

## 2013-03-24 DIAGNOSIS — R7309 Other abnormal glucose: Secondary | ICD-10-CM | POA: Insufficient documentation

## 2013-03-24 DIAGNOSIS — Z8739 Personal history of other diseases of the musculoskeletal system and connective tissue: Secondary | ICD-10-CM | POA: Insufficient documentation

## 2013-03-24 DIAGNOSIS — R0989 Other specified symptoms and signs involving the circulatory and respiratory systems: Secondary | ICD-10-CM | POA: Insufficient documentation

## 2013-03-24 DIAGNOSIS — N289 Disorder of kidney and ureter, unspecified: Secondary | ICD-10-CM | POA: Insufficient documentation

## 2013-03-24 DIAGNOSIS — R42 Dizziness and giddiness: Secondary | ICD-10-CM | POA: Insufficient documentation

## 2013-03-24 DIAGNOSIS — R259 Unspecified abnormal involuntary movements: Secondary | ICD-10-CM | POA: Insufficient documentation

## 2013-03-24 DIAGNOSIS — I10 Essential (primary) hypertension: Secondary | ICD-10-CM | POA: Insufficient documentation

## 2013-03-24 DIAGNOSIS — IMO0002 Reserved for concepts with insufficient information to code with codable children: Secondary | ICD-10-CM | POA: Insufficient documentation

## 2013-03-24 DIAGNOSIS — E871 Hypo-osmolality and hyponatremia: Secondary | ICD-10-CM | POA: Insufficient documentation

## 2013-03-24 DIAGNOSIS — Z3202 Encounter for pregnancy test, result negative: Secondary | ICD-10-CM | POA: Insufficient documentation

## 2013-03-24 DIAGNOSIS — Z856 Personal history of leukemia: Secondary | ICD-10-CM | POA: Insufficient documentation

## 2013-03-24 DIAGNOSIS — R739 Hyperglycemia, unspecified: Secondary | ICD-10-CM

## 2013-03-24 DIAGNOSIS — R358 Other polyuria: Secondary | ICD-10-CM | POA: Insufficient documentation

## 2013-03-24 DIAGNOSIS — R5381 Other malaise: Secondary | ICD-10-CM | POA: Insufficient documentation

## 2013-03-24 DIAGNOSIS — R0609 Other forms of dyspnea: Secondary | ICD-10-CM | POA: Insufficient documentation

## 2013-03-24 LAB — CBC WITH DIFFERENTIAL/PLATELET
BASOS ABS: 0 10*3/uL (ref 0.0–0.1)
Basophils Relative: 0 % (ref 0–1)
EOS PCT: 1 % (ref 0–5)
Eosinophils Absolute: 0.2 10*3/uL (ref 0.0–0.7)
HCT: 30.8 % — ABNORMAL LOW (ref 36.0–46.0)
Hemoglobin: 10.8 g/dL — ABNORMAL LOW (ref 12.0–15.0)
Lymphocytes Relative: 6 % — ABNORMAL LOW (ref 12–46)
Lymphs Abs: 1.2 10*3/uL (ref 0.7–4.0)
MCH: 30.9 pg (ref 26.0–34.0)
MCHC: 35.1 g/dL (ref 30.0–36.0)
MCV: 88 fL (ref 78.0–100.0)
MONOS PCT: 3 % (ref 3–12)
Monocytes Absolute: 0.6 10*3/uL (ref 0.1–1.0)
NEUTROS PCT: 90 % — AB (ref 43–77)
Neutro Abs: 17.9 10*3/uL — ABNORMAL HIGH (ref 1.7–7.7)
PLATELETS: 130 10*3/uL — AB (ref 150–400)
RBC: 3.5 MIL/uL — AB (ref 3.87–5.11)
RDW: 17.7 % — ABNORMAL HIGH (ref 11.5–15.5)
WBC: 19.9 10*3/uL — AB (ref 4.0–10.5)

## 2013-03-24 LAB — CBG MONITORING, ED
GLUCOSE-CAPILLARY: 290 mg/dL — AB (ref 70–99)
Glucose-Capillary: 340 mg/dL — ABNORMAL HIGH (ref 70–99)
Glucose-Capillary: 241 mg/dL — ABNORMAL HIGH (ref 70–99)

## 2013-03-24 LAB — BASIC METABOLIC PANEL
BUN: 38 mg/dL — ABNORMAL HIGH (ref 6–23)
BUN: 32 mg/dL — AB (ref 6–23)
CALCIUM: 8.3 mg/dL — AB (ref 8.4–10.5)
CHLORIDE: 93 meq/L — AB (ref 96–112)
CHLORIDE: 100 meq/L (ref 96–112)
CO2: 22 mEq/L (ref 19–32)
CO2: 21 meq/L (ref 19–32)
CREATININE: 1.32 mg/dL — AB (ref 0.50–1.10)
Calcium: 9.7 mg/dL (ref 8.4–10.5)
Creatinine, Ser: 1.11 mg/dL — ABNORMAL HIGH (ref 0.50–1.10)
GFR calc Af Amer: 67 mL/min — ABNORMAL LOW (ref >90)
GFR, EST AFRICAN AMERICAN: 54 mL/min — AB (ref >90)
GFR, EST NON AFRICAN AMERICAN: 47 mL/min — AB (ref >90)
GFR, EST NON AFRICAN AMERICAN: 58 mL/min — AB (ref >90)
Glucose, Bld: 373 mg/dL — ABNORMAL HIGH (ref 70–99)
Glucose, Bld: 321 mg/dL — ABNORMAL HIGH (ref 70–99)
POTASSIUM: 5.5 meq/L — AB (ref 3.7–5.3)
POTASSIUM: 4.8 meq/L (ref 3.7–5.3)
Sodium: 132 mEq/L — ABNORMAL LOW (ref 137–147)
Sodium: 135 mEq/L — ABNORMAL LOW (ref 137–147)

## 2013-03-24 LAB — URINE MICROSCOPIC-ADD ON

## 2013-03-24 LAB — URINALYSIS, ROUTINE W REFLEX MICROSCOPIC
Bilirubin Urine: NEGATIVE
Glucose, UA: >1000 mg/dL — AB
Hgb urine dipstick: NEGATIVE
KETONES UR: NEGATIVE mg/dL
NITRITE: NEGATIVE
Protein, ur: NEGATIVE mg/dL
Specific Gravity, Urine: 1.017 (ref 1.005–1.030)
Urobilinogen, UA: 0.2 mg/dL (ref 0.0–1.0)
pH: 6 (ref 5.0–8.0)

## 2013-03-24 LAB — POC URINE PREG, ED: PREG TEST UR: NEGATIVE

## 2013-03-24 MED ORDER — SODIUM CHLORIDE 0.9 % IV BOLUS (SEPSIS)
1000.0000 mL | Freq: Once | INTRAVENOUS | Status: AC
  Administered 2013-03-24: 1000 mL via INTRAVENOUS

## 2013-03-24 MED ORDER — SODIUM CHLORIDE 0.9 % IV SOLN
Freq: Once | INTRAVENOUS | Status: AC
  Administered 2013-03-24: 22:00:00 via INTRAVENOUS

## 2013-03-24 MED ORDER — HEPARIN SOD (PORK) LOCK FLUSH 100 UNIT/ML IV SOLN
500.0000 [IU] | INTRAVENOUS | Status: DC | PRN
  Filled 2013-03-24: qty 5

## 2013-03-24 MED ORDER — HEPARIN SOD (PORK) LOCK FLUSH 100 UNIT/ML IV SOLN
500.0000 [IU] | INTRAVENOUS | Status: DC
  Administered 2013-03-24: 500 [IU]

## 2013-03-24 MED ORDER — STERILE WATER FOR INJECTION IJ SOLN
INTRAMUSCULAR | Status: AC
  Administered 2013-03-24: 17:00:00
  Filled 2013-03-24: qty 10

## 2013-03-24 MED ORDER — INSULIN ASPART 100 UNIT/ML ~~LOC~~ SOLN
8.0000 [IU] | Freq: Once | SUBCUTANEOUS | Status: AC
  Administered 2013-03-24: 8 [IU] via SUBCUTANEOUS
  Filled 2013-03-24: qty 1

## 2013-03-24 NOTE — ED Notes (Signed)
CBG result 290.

## 2013-03-24 NOTE — ED Provider Notes (Signed)
CSN: [id number]     Arrival date & time 03/24/13  1442 History   First MD Initiated Contact with Patient 03/24/13 1612     Chief Complaint  Patient presents with  . Dehydration  . Fatigue     (Consider location/radiation/quality/duration/timing/severity/associated sxs/prior Treatment) HPI 48 yo female presents with fatigue x 3 days. Admits to DOE, "feeling unbalanced", Polyuria, Polydypisia, "shakiness", and intermittent palpitations. Denies Chest pain, HA, abdominal pain, N/V/D/C, dysuria, hematuria,bloody stools. Patient states she had a bone marrow transplant on 02/06/13. Patient started on prednisone towards the end of January and is currently still taking it. Patient seen Friday, 3 days ago for routine check up with oncologist. Patient noted to have hyperglycemia at that time and was treated with insulin and fluids. Patient denies hx DM.  PMH significant ALL, HTN, PE, Anemia, Arthritis. Surgical hx significant for Greenfield filter.   Past Medical History  Diagnosis Date  . Hypertension   . Brain bleed     07/04/11  . Pulmonary embolism     06/22/11  . Headache(784.0)   . Arthritis   . Anemia   . ALL (acute lymphoblastic leukemia)   . Leukemia    Past Surgical History  Procedure Laterality Date  . Cesarean section    . Foot surgery    . Foot surgery    . Cesarean section    . Eye surgery    . Craniotomy    . Greenfield filter     Family History  Problem Relation Age of Onset  . Pancreatic cancer Father   . Cancer Mother     colon  . Pancreatic cancer Mother    History  Substance Use Topics  . Smoking status: Never Smoker   . Smokeless tobacco: Never Used  . Alcohol Use: No   OB History   Grav Para Term Preterm Abortions TAB SAB Ect Mult Living                 Review of Systems  Neurological: Positive for light-headedness.  All other systems reviewed and are negative.      Allergies  Fluconazole  Home Medications   Current Outpatient Rx  Name  Route   Sig  Dispense  Refill  . acyclovir (ZOVIRAX) 800 MG tablet      800 mg. Take 1 tablet (800 mg total) by mouth 2 times daily.         Marland Kitchen amitriptyline (ELAVIL) 10 MG tablet   Oral   Take 1 tablet (10 mg total) by mouth at bedtime.   30 tablet   6   . lidocaine-prilocaine (EMLA) cream   Topical   Apply 1 application topically as needed (apply to port).          . magnesium chloride (SLOW-MAG) 64 MG TBEC SR tablet   Oral   Take 2 tablets by mouth 3 (three) times daily.          . Multiple Vitamin (MULTIVITAMIN) tablet   Oral   Take 1 tablet by mouth daily.         . penicillin v potassium (VEETID) 500 MG tablet   Oral   Take 500 mg by mouth daily.         . predniSONE (DELTASONE) 20 MG tablet   Oral   Take 20 mg by mouth daily with breakfast.         . prochlorperazine (COMPAZINE) 5 MG tablet   Oral   Take 5 mg by mouth as  needed (nausea).          . promethazine (PHENERGAN) 12.5 MG tablet   Oral   Take 12.5 mg by mouth every 6 (six) hours as needed for nausea.         Marland Kitchen sulfamethoxazole-trimethoprim (BACTRIM DS) 800-160 MG per tablet   Oral   Take 1 tablet by mouth daily. 1 tab BID on Mon, Wed and Fri         . potassium chloride (MICRO-K) 10 MEQ CR capsule   Oral   Take 20 mEq by mouth daily. Take 2 capsules (20 mEq total) by mouth 2 times daily.          BP 126/80  Pulse 100  Temp(Src) 98.4 F (36.9 C) (Oral)  Resp 18  SpO2 100% Physical Exam  Nursing note and vitals reviewed. Constitutional: She is oriented to person, place, and time. She appears well-developed and well-nourished. No distress.  HENT:  Head: Normocephalic and atraumatic.  Mouth/Throat: Uvula is midline and oropharynx is clear and moist. Mucous membranes are dry. No oropharyngeal exudate, posterior oropharyngeal edema or posterior oropharyngeal erythema.  Eyes: Conjunctivae and EOM are normal. Pupils are equal, round, and reactive to light. No scleral icterus.  Neck:  Trachea normal, normal range of motion and phonation normal. Neck supple. No JVD present. Carotid bruit is not present. No tracheal deviation present.  Cardiovascular: Normal rate and regular rhythm.  Exam reveals no gallop and no friction rub.   No murmur heard. Pulmonary/Chest: Effort normal and breath sounds normal. No respiratory distress. She has no wheezes. She has no rhonchi. She has no rales.  Abdominal: Soft. Bowel sounds are normal. She exhibits no distension. There is no hepatosplenomegaly. There is no tenderness. There is no rigidity, no rebound, no guarding, no tenderness at McBurney's point and negative Murphy's sign.  Musculoskeletal: Normal range of motion. She exhibits no edema.  Neurological: She is alert and oriented to person, place, and time. She has normal strength. No cranial nerve deficit or sensory deficit.  CN II-XII grossly intact. Cerebellar function appears intact with finger to nose.   Skin: Skin is warm and dry. No rash noted. She is not diaphoretic.  Psychiatric: She has a normal mood and affect. Her behavior is normal.    ED Course  Procedures (including critical care time) Labs Review Labs Reviewed  URINALYSIS, ROUTINE W REFLEX MICROSCOPIC - Abnormal; Notable for the following:    Glucose, UA >1000 (*)    Leukocytes, UA SMALL (*)    All other components within normal limits  CBC WITH DIFFERENTIAL - Abnormal; Notable for the following:    WBC 19.9 (*)    RBC 3.50 (*)    Hemoglobin 10.8 (*)    HCT 30.8 (*)    RDW 17.7 (*)    Platelets 130 (*)    Neutrophils Relative % 90 (*)    Lymphocytes Relative 6 (*)    Neutro Abs 17.9 (*)    All other components within normal limits  BASIC METABOLIC PANEL - Abnormal; Notable for the following:    Sodium 132 (*)    Potassium 5.5 (*)    Chloride 93 (*)    Glucose, Bld 373 (*)    BUN 38 (*)    Creatinine, Ser 1.32 (*)    GFR calc non Af Amer 47 (*)    GFR calc Af Amer 54 (*)    All other components within  normal limits  URINE MICROSCOPIC-ADD ON - Abnormal; Notable for  the following:    Casts GRANULAR CAST (*)    All other components within normal limits  OSMOLALITY - Abnormal; Notable for the following:    Osmolality 303 (*)    All other components within normal limits  BASIC METABOLIC PANEL - Abnormal; Notable for the following:    Sodium 135 (*)    Glucose, Bld 321 (*)    BUN 32 (*)    Creatinine, Ser 1.11 (*)    Calcium 8.3 (*)    GFR calc non Af Amer 58 (*)    GFR calc Af Amer 67 (*)    All other components within normal limits  CBG MONITORING, ED - Abnormal; Notable for the following:    Glucose-Capillary 340 (*)    All other components within normal limits  CBG MONITORING, ED - Abnormal; Notable for the following:    Glucose-Capillary 290 (*)    All other components within normal limits  CBG MONITORING, ED - Abnormal; Notable for the following:    Glucose-Capillary 241 (*)    All other components within normal limits  POC URINE PREG, ED   Imaging Review No results found.  EKG Interpretation    Date/Time:  Monday March 24 2013 16:22:18 EST Ventricular Rate:  106 PR Interval:  111 QRS Duration: 75 QT Interval:  318 QTC Calculation: 422 R Axis:   37 Text Interpretation:  Sinus tachycardia Abnormal R-wave progression, early transition Baseline wander in lead(s) V5 Borderline ECG Confirmed by [facility]  MD, MICHEAL ([id number]) on 03/24/2013 4:33:42 PM            MDM   Final diagnoses:  Hyperkalemia  Hyperglycemia  Weakness generalized  Hyponatremia  Renal insufficiency    EKG shows sinus tach. Resolved with IV fluids, suspect secondary to dehydration. Hyperglycemia improved with IV fluids and insulin. Hyponatremia improving with Fluid resuscitation Hyperosmolar at 303  Renal insufficiency improved with IV fluids Urine preg negative UA shows glucosuria Leukocytosis improved from prior visit. Patient taking prednisone Hgb low at 10.8, improved from previous.  Patient with hx of ALL Hyperkalemia resolved with IV fluids and Insulin   Patient sxs markedly improved with treatment in ED. Patient states she has an appointment tomorrow at [place] with Heme/Onc at [facility]. Patient appears stable for discharge. Patient encouraged to keep appointment. Patient given strict return precautions to return to ED for worsening sxs, fever/chills, chest pain, shortness of breath, or abdominal pain. P   Keep your appointment tomorrow at [facility] with Hematology/Oncology. Return to Emergency department if you have worsening symptoms, fever/chills, Chest pain, Shortness of breath, or abdominal pain. Discussed causes of hyperglycemia and discussed hyperglycemic diet. Patient agrees with plan. Discharged in good condition.   Patient discussed with Dr. Dorna Mai.   Meds given in ED:  Medications  sterile water (preservative free) injection (  Given 03/24/13 1655)  sodium chloride 0.9 % bolus 1,000 mL (0 mLs Intravenous Stopped 03/24/13 1813)  sodium chloride 0.9 % bolus 1,000 mL (0 mLs Intravenous Stopped 03/24/13 1920)  sodium chloride 0.9 % bolus 1,000 mL (0 mLs Intravenous Stopped 03/24/13 2032)  insulin aspart (novoLOG) injection 8 Units (8 Units Subcutaneous Given 03/24/13 2014)  0.9 %  sodium chloride infusion ( Intravenous Stopped 03/24/13 2302)    Discharge Medication List as of 03/24/2013 10:46 PM       Sherrie George, PA-C 03/25/13 1401

## 2013-03-24 NOTE — ED Notes (Signed)
Pt c/o of dehydration, fatigue, and SOB for the past couple of days. Hx of leukemia and PE. Received bone marrow transplant on Jan 8th at [facility]. Denies fever. AAO x4.

## 2013-03-24 NOTE — Discharge Instructions (Signed)
Keep your appointment tomorrow at [facility] with Hematology/Oncology. Return to Emergency department if you have worsening symptoms, fever/chills, Chest pain, Shortness of breath, or abdominal pain.

## 2013-03-24 NOTE — ED Notes (Signed)
Pt is aware of the need for urine. 

## 2013-03-24 NOTE — ED Notes (Signed)
Bed: VV74 Expected date:  Expected time:  Means of arrival:  Comments: Triage - Kenton Kingfisher

## 2013-03-24 NOTE — ED Provider Notes (Signed)
Medical screening examination/treatment/procedure(s) were conducted as a shared visit with non-physician practitioner(s) and myself.  I personally evaluated the patient during the encounter.  EKG Interpretation    Date/Time:  Monday March 24 2013 16:22:18 EST Ventricular Rate:  106 PR Interval:  111 QRS Duration: 75 QT Interval:  318 QTC Calculation: 422 R Axis:   37 Text Interpretation:  Sinus tachycardia Abnormal R-wave progression, early transition Baseline wander in lead(s) V5 Borderline ECG Confirmed by [facility]  MD, MICHEAL ([id number]) on 03/24/2013 4:33:42 PM            Pt with h/o cancer, s/p bone marrow transplant, on steroids, found to have steroid induced hyperglycemai in clinic last week, received IVF's, oral meds, insulin and discharged on oral.  Pt returns today due to weakness, generalized, no fever here.  Pt given additional IVF's, insulin and pt's electrolytes improved, anion gap improved in the ED.  VS improved.  Pt has close follow up tomorrow and she is comfortable going home.  No ketones in UA, doubt HHNK given numbers don't support, pt is not confused and is improving in the ED.    Saddie Benders. Dorna Mai, MD 03/24/13 2242

## 2013-03-24 NOTE — ED Provider Notes (Deleted)
CSN: [id number]     Arrival date & time 03/24/13  1442 History   First MD Initiated Contact with Patient 03/24/13 1612     Chief Complaint  Patient presents with  . Dehydration  . Fatigue     (Consider location/radiation/quality/duration/timing/severity/associated sxs/prior Treatment) HPI  Past Medical History  Diagnosis Date  . Hypertension   . Brain bleed     07/04/11  . Pulmonary embolism     06/22/11  . Headache(784.0)   . Arthritis   . Anemia   . ALL (acute lymphoblastic leukemia)   . Leukemia    Past Surgical History  Procedure Laterality Date  . Cesarean section    . Foot surgery    . Foot surgery    . Cesarean section    . Eye surgery    . Craniotomy    . Greenfield filter     Family History  Problem Relation Age of Onset  . Pancreatic cancer Father   . Cancer Mother     colon  . Pancreatic cancer Mother    History  Substance Use Topics  . Smoking status: Never Smoker   . Smokeless tobacco: Never Used  . Alcohol Use: No   OB History   Grav Para Term Preterm Abortions TAB SAB Ect Mult Living                 Review of Systems    Allergies  Fluconazole  Home Medications   Current Outpatient Rx  Name  Route  Sig  Dispense  Refill  . acyclovir (ZOVIRAX) 800 MG tablet      800 mg. Take 1 tablet (800 mg total) by mouth 2 times daily.         Marland Kitchen amitriptyline (ELAVIL) 10 MG tablet   Oral   Take 1 tablet (10 mg total) by mouth at bedtime.   30 tablet   6   . lidocaine-prilocaine (EMLA) cream   Topical   Apply 1 application topically as needed (apply to port).          . magnesium chloride (SLOW-MAG) 64 MG TBEC SR tablet   Oral   Take 2 tablets by mouth 3 (three) times daily.          . Multiple Vitamin (MULTIVITAMIN) tablet   Oral   Take 1 tablet by mouth daily.         . penicillin v potassium (VEETID) 500 MG tablet   Oral   Take 500 mg by mouth daily.         . predniSONE (DELTASONE) 20 MG tablet   Oral   Take 20 mg  by mouth daily with breakfast.         . prochlorperazine (COMPAZINE) 5 MG tablet   Oral   Take 5 mg by mouth as needed (nausea).          . promethazine (PHENERGAN) 12.5 MG tablet   Oral   Take 12.5 mg by mouth every 6 (six) hours as needed for nausea.         Marland Kitchen sulfamethoxazole-trimethoprim (BACTRIM DS) 800-160 MG per tablet   Oral   Take 1 tablet by mouth daily. 1 tab BID on Mon, Wed and Fri         . potassium chloride (MICRO-K) 10 MEQ CR capsule   Oral   Take 20 mEq by mouth daily. Take 2 capsules (20 mEq total) by mouth 2 times daily.  BP 145/91  Pulse 85  Temp(Src) 97.8 F (36.6 C) (Oral)  Resp 17  SpO2 100% Physical Exam  ED Course  Procedures (including critical care time) Labs Review Labs Reviewed  URINALYSIS, ROUTINE W REFLEX MICROSCOPIC - Abnormal; Notable for the following:    Glucose, UA >1000 (*)    Leukocytes, UA SMALL (*)    All other components within normal limits  CBC WITH DIFFERENTIAL - Abnormal; Notable for the following:    WBC 19.9 (*)    RBC 3.50 (*)    Hemoglobin 10.8 (*)    HCT 30.8 (*)    RDW 17.7 (*)    Platelets 130 (*)    Neutrophils Relative % 90 (*)    Lymphocytes Relative 6 (*)    Neutro Abs 17.9 (*)    All other components within normal limits  BASIC METABOLIC PANEL - Abnormal; Notable for the following:    Sodium 132 (*)    Potassium 5.5 (*)    Chloride 93 (*)    Glucose, Bld 373 (*)    BUN 38 (*)    Creatinine, Ser 1.32 (*)    GFR calc non Af Amer 47 (*)    GFR calc Af Amer 54 (*)    All other components within normal limits  URINE MICROSCOPIC-ADD ON - Abnormal; Notable for the following:    Casts GRANULAR CAST (*)    All other components within normal limits  BASIC METABOLIC PANEL - Abnormal; Notable for the following:    Sodium 135 (*)    Glucose, Bld 321 (*)    BUN 32 (*)    Creatinine, Ser 1.11 (*)    Calcium 8.3 (*)    GFR calc non Af Amer 58 (*)    GFR calc Af Amer 67 (*)    All other  components within normal limits  CBG MONITORING, ED - Abnormal; Notable for the following:    Glucose-Capillary 340 (*)    All other components within normal limits  CBG MONITORING, ED - Abnormal; Notable for the following:    Glucose-Capillary 290 (*)    All other components within normal limits  CBG MONITORING, ED - Abnormal; Notable for the following:    Glucose-Capillary 241 (*)    All other components within normal limits  OSMOLALITY  POC URINE PREG, ED   Imaging Review No results found.  EKG Interpretation    Date/Time:  Monday March 24 2013 16:22:18 EST Ventricular Rate:  106 PR Interval:  111 QRS Duration: 75 QT Interval:  318 QTC Calculation: 422 R Axis:   37 Text Interpretation:  Sinus tachycardia Abnormal R-wave progression, early transition Baseline wander in lead(s) V5 Borderline ECG Confirmed by [facility]  MD, MICHEAL ([id number]) on 03/24/2013 4:33:42 PM            MDM   Final diagnoses:  Hyperkalemia  Hyperglycemia  Weakness generalized  Hyponatremia  Renal insufficiency   EKG shows sinus tach. Resolved with IV fluids, suspect secondary to dehydration. Hyperglycemia improved with IV fluids and insulin. Hyponatremia improving with Fluid resuscitation Hyperosmolar at 303  Renal insufficiency improved with IV fluids Urine preg negative UA shows glucosuria Leukocytosis improved from prior visit. Patient taking prednisone Hgb low at 10.8, improved from previous. Patient with hx of ALL Hyperkalemia resolved with IV fluids and Insulin   Patient sxs markedly improved with treatment in ED. Patient states she has an appointment tomorrow at 10am with Heme/Onc at [facility]. Patient appears stable for discharge.  Patient encouraged to keep appointment. Patient given strict return precautions to return to ED for worsening sxs, fever/chills, chest pain, shortness of breath, or abdominal pain. P   Keep your appointment tomorrow at [facility] with Hematology/Oncology. Return to Emergency department if you have worsening symptoms, fever/chills, Chest pain, Shortness of breath, or abdominal pain. Discussed causes of hyperglycemia and discussed hyperglycemic diet. Patient agrees with plan. Discharged in good condition.   Patient discussed with Dr. Dorna Mai.   Meds given in ED:  Medications  sterile water (preservative free) injection (  Given 03/24/13 1655)  sodium chloride 0.9 % bolus 1,000 mL (0 mLs Intravenous Stopped 03/24/13 1813)  sodium chloride 0.9 % bolus 1,000 mL (0 mLs Intravenous Stopped 03/24/13 1920)  sodium chloride 0.9 % bolus 1,000 mL (0 mLs Intravenous Stopped 03/24/13 2032)  insulin aspart (novoLOG) injection 8 Units (8 Units Subcutaneous Given 03/24/13 2014)  0.9 %  sodium chloride infusion ( Intravenous Stopped 03/24/13 2302)    Discharge Medication List as of 03/24/2013 10:46 PM         Sherrie George, PA-C 03/25/13 1356

## 2013-03-24 NOTE — ED Notes (Signed)
Pt is A&O x3.  Denies pain and nausea.  Appears comfortable at this time.  Will continue to monitor.

## 2013-03-25 LAB — OSMOLALITY: Osmolality: 303 mosm/kg — ABNORMAL HIGH (ref 275–300)

## 2013-03-26 ENCOUNTER — Ambulatory Visit (HOSPITAL_BASED_OUTPATIENT_CLINIC_OR_DEPARTMENT_OTHER): Payer: Medicaid Other

## 2013-03-26 ENCOUNTER — Telehealth: Payer: Self-pay | Admitting: *Deleted

## 2013-03-26 ENCOUNTER — Telehealth: Payer: Self-pay | Admitting: Medical Oncology

## 2013-03-26 ENCOUNTER — Telehealth: Payer: Self-pay | Admitting: Internal Medicine

## 2013-03-26 ENCOUNTER — Other Ambulatory Visit: Payer: Self-pay | Admitting: Medical Oncology

## 2013-03-26 DIAGNOSIS — C9100 Acute lymphoblastic leukemia not having achieved remission: Secondary | ICD-10-CM

## 2013-03-26 DIAGNOSIS — C9101 Acute lymphoblastic leukemia, in remission: Secondary | ICD-10-CM

## 2013-03-26 LAB — COMPREHENSIVE METABOLIC PANEL (CC13)
ALK PHOS: 68 U/L (ref 40–150)
ALT: 19 U/L (ref 0–55)
AST: 15 U/L (ref 5–34)
Albumin: 3.3 g/dL — ABNORMAL LOW (ref 3.5–5.0)
Anion Gap: 11 mEq/L (ref 3–11)
BILIRUBIN TOTAL: 0.2 mg/dL (ref 0.20–1.20)
BUN: 32.3 mg/dL — AB (ref 7.0–26.0)
CO2: 22 mEq/L (ref 22–29)
CREATININE: 1.4 mg/dL — AB (ref 0.6–1.1)
Calcium: 9.4 mg/dL (ref 8.4–10.4)
Chloride: 105 mEq/L (ref 98–109)
GLUCOSE: 335 mg/dL — AB (ref 70–140)
Potassium: 4.1 mEq/L (ref 3.5–5.1)
Sodium: 137 mEq/L (ref 136–145)
Total Protein: 6 g/dL — ABNORMAL LOW (ref 6.4–8.3)

## 2013-03-26 NOTE — Telephone Encounter (Signed)
I called [facility] and spoke with [facility] regarding labs orders. Pt had called requesting to have her labs drawn here today. Per Jonelle Sidle pt only needs to have a CMET drawn to recheck her glucose and kidney function.

## 2013-03-26 NOTE — Telephone Encounter (Signed)
Lab today per Cira Rue, RN

## 2013-03-26 NOTE — Telephone Encounter (Signed)
Received call from pt requesting an appt. For labs.  Pt stated she was in ER on Monday for dehydration, abnormal glucose level, has to adjust insulin dose by ER.  Pt called Dr. Harvel Ricks at [facility] - pt sees md for leukemia.  Pt was instructed to go to [facility] for labs rechecked.  However, pt was told that it would be ok for pt to have labs drawn at our clinic.  Pt would like to obtain an appt for lab today.  Message to Shirlean Mylar, desk nurse for Dr. Juliann Mule. Pt's  Phone    [phone].

## 2013-04-09 ENCOUNTER — Other Ambulatory Visit (HOSPITAL_BASED_OUTPATIENT_CLINIC_OR_DEPARTMENT_OTHER): Payer: Medicaid Other

## 2013-04-09 ENCOUNTER — Telehealth: Payer: Self-pay | Admitting: Internal Medicine

## 2013-04-09 ENCOUNTER — Ambulatory Visit (HOSPITAL_BASED_OUTPATIENT_CLINIC_OR_DEPARTMENT_OTHER): Payer: Medicaid Other | Admitting: Internal Medicine

## 2013-04-09 VITALS — BP 137/88 | HR 124 | Temp 98.4°F | Resp 20 | Ht 66.0 in | Wt 241.5 lb

## 2013-04-09 DIAGNOSIS — K219 Gastro-esophageal reflux disease without esophagitis: Secondary | ICD-10-CM

## 2013-04-09 DIAGNOSIS — C9100 Acute lymphoblastic leukemia not having achieved remission: Secondary | ICD-10-CM

## 2013-04-09 DIAGNOSIS — C9102 Acute lymphoblastic leukemia, in relapse: Secondary | ICD-10-CM

## 2013-04-09 DIAGNOSIS — R7989 Other specified abnormal findings of blood chemistry: Secondary | ICD-10-CM

## 2013-04-09 DIAGNOSIS — Z86711 Personal history of pulmonary embolism: Secondary | ICD-10-CM

## 2013-04-09 DIAGNOSIS — C9101 Acute lymphoblastic leukemia, in remission: Secondary | ICD-10-CM

## 2013-04-09 DIAGNOSIS — Z95828 Presence of other vascular implants and grafts: Secondary | ICD-10-CM

## 2013-04-09 DIAGNOSIS — Z9484 Stem cells transplant status: Secondary | ICD-10-CM

## 2013-04-09 LAB — COMPREHENSIVE METABOLIC PANEL (CC13)
ALBUMIN: 3.5 g/dL (ref 3.5–5.0)
ALT: 59 U/L — AB (ref 0–55)
ANION GAP: 13 meq/L — AB (ref 3–11)
AST: 43 U/L — ABNORMAL HIGH (ref 5–34)
Alkaline Phosphatase: 76 U/L (ref 40–150)
BUN: 17 mg/dL (ref 7.0–26.0)
CALCIUM: 9.1 mg/dL (ref 8.4–10.4)
CHLORIDE: 104 meq/L (ref 98–109)
CO2: 24 meq/L (ref 22–29)
CREATININE: 1.2 mg/dL — AB (ref 0.6–1.1)
Glucose: 236 mg/dl — ABNORMAL HIGH (ref 70–140)
Potassium: 3.6 mEq/L (ref 3.5–5.1)
Sodium: 141 mEq/L (ref 136–145)
Total Bilirubin: 0.31 mg/dL (ref 0.20–1.20)
Total Protein: 6.4 g/dL (ref 6.4–8.3)

## 2013-04-09 LAB — CBC WITH DIFFERENTIAL/PLATELET
BASO%: 0.5 % (ref 0.0–2.0)
Basophils Absolute: 0 10*3/uL (ref 0.0–0.1)
EOS ABS: 0.1 10*3/uL (ref 0.0–0.5)
EOS%: 2 % (ref 0.0–7.0)
HEMATOCRIT: 28.7 % — AB (ref 34.8–46.6)
HGB: 9.7 g/dL — ABNORMAL LOW (ref 11.6–15.9)
LYMPH%: 17.3 % (ref 14.0–49.7)
MCH: 31.4 pg (ref 25.1–34.0)
MCHC: 33.8 g/dL (ref 31.5–36.0)
MCV: 92.9 fL (ref 79.5–101.0)
MONO#: 0.7 10*3/uL (ref 0.1–0.9)
MONO%: 10.8 % (ref 0.0–14.0)
NEUT%: 69.4 % (ref 38.4–76.8)
NEUTROS ABS: 4.4 10*3/uL (ref 1.5–6.5)
NRBC: 1 % — AB (ref 0–0)
Platelets: 107 10*3/uL — ABNORMAL LOW (ref 145–400)
RBC: 3.09 10*6/uL — ABNORMAL LOW (ref 3.70–5.45)
RDW: 22.5 % — ABNORMAL HIGH (ref 11.2–14.5)
WBC: 6.4 10*3/uL (ref 3.9–10.3)
lymph#: 1.1 10*3/uL (ref 0.9–3.3)

## 2013-04-09 LAB — LACTATE DEHYDROGENASE (CC13): LDH: 323 U/L — ABNORMAL HIGH (ref 125–245)

## 2013-04-09 LAB — TECHNOLOGIST REVIEW

## 2013-04-09 NOTE — Telephone Encounter (Signed)
gave pt appt for lab and Md on april 2015

## 2013-04-11 NOTE — Progress Notes (Signed)
[place], [address] [place] [address]  DIAGNOSIS: Acute lymphoid leukemia - Plan: CBC with Differential, Comprehensive metabolic panel (Cmet) - [facility], Lactate dehydrogenase (LDH) - [facility], Magnesium - [facility]  Port-a-cath in place  Chief Complaint  Patient presents with  . Acute lymphoid leukemia in remission   CURRENT THERAPY:   Dasatinib 50 mg daily, which was started on 05/31/2012. The patient had been on Dasatinib 100 mg daily at least since 12/15/2011. The dose was reduced because of neutropenia. The patient also received dasatinib during induction treatment which dates back to May 2013.  She had relapse on Sept 22 and was hospitalized until Oct 31.  She underwent Allogeneic SCT (9/10 match with mismatch at DQ) on December 29 th at [facility] (Dr. Harvel Ricks).  INTERVAL HISTORY: Lynn [name] 48 y.o. 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  She was last seen by me on 01/09/2013.  She is being managed by [facility].  She underwent Allogeneic stem cell trasnplant on 02/06/2013.  She was admitted on 01/28/2013 for prepartive regimen of Busulfan/CTX.  Her post transplant course was complicated by c. Difficile positive diarrhea treated with oral vancomycin, pneumonia likely bacterial.  CXR with new patchy opacities treated with zosyn and moxifloxacin and acute kidney injury secondary to dehydration from diarrhea and ATN which improved with hydration.  Her WBC engrafted on 02/24/2013 and platelets on 02/20/2013.    She is day + 64 post transplant today.  She last saw Dr. Harvel Ricks on 03/21/2013 for follow-up and is scheduled back on Friday.  She is on a prednisone taper secondary to possible GI GVHD (EGD and flex sig results for negative)since 03/14/2013 as follows: Decrease prednisone to 60 mg twice a day for 5 days; then 40 mg twice a day for 3 days; 20 mg twice a day for 3 days; 10 mg twice a day for 3  days, 10 mg ONCE a day for 7 days then stop. Today, she reports that he energy is slowly improvement.  She is being followed by Dr. Harvel Ricks every 2 weeks for labs.   MEDICAL HISTORY: Past Medical History  Diagnosis Date  . Hypertension   . Brain bleed     07/04/11  . Pulmonary embolism     06/22/11  . Headache(784.0)   . Arthritis   . Anemia   . ALL (acute lymphoblastic leukemia)   . Leukemia     INTERIM HISTORY: has Microcytosis; Acute pulmonary embolism; Leukocytosis; Thrombocytopenia; Anemia; Hilar lymphadenopathy; Leukemia, acute lymphoid; Acute lymphoid leukemia; Acute lymphoid leukemia in remission; Transfusion reaction; Viral meningitis; Encounter for antineoplastic chemotherapy; Essential hypertension; History of peripheral stem cell transplant; Subdural hemorrhage; Neutropenia; Fever; Right thyroid nodule; Port-a-cath in place; History of pulmonary embolism; and Greenfield filter in place on her problem list.    ALLERGIES:  has No Known Allergies.  MEDICATIONS: has a current medication list which includes the following prescription(s): acyclovir, amlodipine, fluconazole, folic acid, glipizide, lidocaine-prilocaine, magnesium chloride, multivitamin, penicillin v potassium, potassium chloride, prednisone, promethazine, ranitidine, magnesium (amino acid chelate), sulfamethoxazole-trimethoprim, sumatriptan, tacrolimus, tramadol, ursodiol, amitriptyline, and prochlorperazine.  SURGICAL HISTORY:  Past Surgical History  Procedure Laterality Date  . Cesarean section    . Foot surgery    . Foot surgery    . Cesarean section    . Eye surgery    . Craniotomy    . Greenfield filter  PROBLEM LIST:  1. B-cell acute lymphoblastic leukemia, Philadelphia chromosome positive, t(9; 22) initially with 71% blasts seen on bone marrow carried out on Jun 19, 2011. Subsequent bone marrow at [facility] on 06/22/2011 showed 98% blasts and peripheral blood showed 71%  blasts. The patient received induction treatment with dasatinib (Sprycel) and Decadron. Her hospital course was complicated by the development of a posterior fossa subdural hematoma requiring a suboccipital craniotomy and evacuation of the hematoma on 07/04/2011. The patient also had neutropenic fever with negative cultures covered with broad-spectrum antibiotics. She had some headaches, vaginal bleeding and transaminitis. Admission to [facility] was from 06/21/2011 through 07/17/2011. Bone marrow carried out on 07/06/2011 showed a hypocellular bone marrow with no evidence for ALL. The patient was enrolled on protocol CALGB 71696. That protocol consisted of treatment with dasatinib along with chemotherapy. A bone marrow carried out on 07/28/2011 showed pan hypoplasia with no evidence for acute leukemia. Cytogenetics were normal. FISH studies were negative for t(9; 22). PCR for small p190 in both the peripheral blood and bone marrow returned negative at 0.000. On 08/08/2011 the patient received high-dose methotrexate with leucovorin rescue. She also received IV vincristine 2.0 mg and CNS prophylaxis with intrathecal methotrexate/hydrocortisone. CSF was negative. On 09/18/2011 bone marrow biopsy showed no evidence of ALL. PCR for BCR/ABL showed 0.00001 fusion events. It was felt that the patient had achieved a complete hematologic response. On 09/26/2011 mobilization therapy with cytarabine/etoposide was started The patient was admitted to the hospital from 11/14/2011 through 11/28/2011, at which time she received high-dose therapy and her autologous stem cell transplant. She received melphalan on days -2 and days -1. Day 0 was 11/16/2011. The patient also received G-CSF. She had severe mucositis and dysphagia on 11/20/2011 which required morphine by PCA. She had some fevers on 11/23/2011. She was treated with antibiotics for prophylaxis. She had some headaches, but those have  subsequently resolved. Venous Dopplers of her lower extremities were done bilaterally because of some swelling of her legs. These were negative for blood clots. It will be recalled that the patient does have an inferior vena cava Greenfield filter that was placed on 07/07/2011 for her prior history of pulmonary emboli. As stated, the patient was discharged from [facility] on October 29th. Enrika tells me that she was restarted on dasatinib 100 mg daily in mid November. Records from [facility] indicate that bone marrows carried out on 10/16/2011, 11/02/2011, 12/15/2011, 03/08/2012 and 05/31/2012 were negative for any signs of leukemia.  2. Pulmonary emboli involving the right upper and right lower lobes with positive CT chest angiogram on 06/17/2011 and negative Dopplers.  3. Development of subdural hematoma involving the posterior fossa when the patient was hospitalized at [facility], status post suboccipital craniotomy and evacuation of hematoma on 07/04/2011.  4. History of hypertension.  5. Morbid obesity.  6. Arthritis involving the right hip.  7. History of palpitations.  8. History of migraine headaches.  9. Placement of inferior vena cava Greenfield filter on 07/07/2011.  10. Mild renal insufficiency.  11. Possible 1.1 cm, hypodense lesion in the right thyroid lobe noted on CT angiogram of the chest from 05/13/2012.  12. Right-sided double lumen Port-A-Cath placed at [facility] on 07/12/2011.   REVIEW OF SYSTEMS:   Constitutional: Denies fevers, chills or abnormal weight loss; reports some periods of shakiness due to low  blood glucose. Eyes: Denies blurriness of vision Ears, nose, mouth, throat, and face: Denies mucositis or sore throat Respiratory: Denies cough, dyspnea or wheezes Cardiovascular: Denies palpitation, chest discomfort or lower extremity  swelling Gastrointestinal:  Denies nausea, heartburn or change in bowel habits Skin: Reports mild skin hypopigmentation since last transplant.  Lymphatics: Denies new lymphadenopathy or easy bruising Neurological:Denies numbness, tingling or new weaknesses Behavioral/Psych: Mood is stable, no new changes  All other systems were reviewed with the patient and are negative.  PHYSICAL EXAMINATION: ECOG PERFORMANCE STATUS: 0 - Asymptomatic  Blood pressure 137/88, pulse 124, temperature 98.4 F (36.9 C), temperature source Oral, resp. rate 20, height _0  (1.676 m), weight 241 lb 8 oz (109.544 kg), SpO2 100.00%.  GENERAL:alert, no distress and comfortable; chronically ill appearing woman with alopecia; moderately obese SKIN: skin color, texture, turgor are normal, no rashes or significant lesions EYES: normal, Conjunctiva are pink and non-injected, sclera clear OROPHARYNX:no exudate, no erythema and lips, buccal mucosa, and tongue normal  NECK: supple, thyroid normal size, non-tender, without nodularity LYMPH:  no palpable lymphadenopathy in the cervical, axillary or supraclavicular LUNGS: clear to auscultation and percussion with normal breathing effort HEART: regular rate & rhythm and no murmurs and no lower extremity edema ABDOMEN:abdomen soft, non-tender and normal bowel sounds Musculoskeletal:no cyanosis of digits and no clubbing  NEURO: alert & oriented x 3 with fluent speech, no focal motor/sensory deficits  LABORATORY DATA: Results for orders placed in visit on 04/09/13 (from the past 48 hour(s))  CBC WITH DIFFERENTIAL     Status: Abnormal   Collection Time    04/09/13 10:14 AM      Result Value Ref Range   WBC 6.4  3.9 - 10.3 10e3/uL   NEUT# 4.4  1.5 - 6.5 10e3/uL   HGB 9.7 (*) 11.6 - 15.9 g/dL   HCT 28.7 (*) 34.8 - 46.6 %   Platelets 107 (*) 145 - 400 10e3/uL   MCV 92.9  79.5 - 101.0 fL   MCH 31.4  25.1 - 34.0 pg   MCHC 33.8  31.5 - 36.0 g/dL   RBC 3.09 (*) 3.70 - 5.45  10e6/uL   RDW 22.5 (*) 11.2 - 14.5 %   lymph# 1.1  0.9 - 3.3 10e3/uL   MONO# 0.7  0.1 - 0.9 10e3/uL   Eosinophils Absolute 0.1  0.0 - 0.5 10e3/uL   Basophils Absolute 0.0  0.0 - 0.1 10e3/uL   NEUT% 69.4  38.4 - 76.8 %   LYMPH% 17.3  14.0 - 49.7 %   MONO% 10.8  0.0 - 14.0 %   EOS% 2.0  0.0 - 7.0 %   BASO% 0.5  0.0 - 2.0 %   nRBC 1 (*) 0 - 0 %  LACTATE DEHYDROGENASE (CC13)     Status: Abnormal   Collection Time    04/09/13 10:14 AM      Result Value Ref Range   LDH 323 (*) 125 - 245 U/L  TECHNOLOGIST REVIEW     Status: None   Collection Time    04/09/13 10:14 AM      Result Value Ref Range   Technologist Review Rare Meta and Myelocyte present    COMPREHENSIVE METABOLIC PANEL (YT01)     Status: Abnormal   Collection Time    04/09/13 10:14 AM      Result Value Ref Range   Sodium 141  136 - 145 mEq/L   Potassium 3.6  3.5 - 5.1 mEq/L   Chloride 104  98 - 109 mEq/L   CO2 24  22 - 29 mEq/L   Glucose 236 (*) 70 - 140 mg/dl   BUN 17.0  7.0 - 26.0 mg/dL   Creatinine 1.2 (*) 0.6 - 1.1 mg/dL   Total Bilirubin 0.31  0.20 - 1.20 mg/dL   Alkaline Phosphatase 76  40 - 150 U/L   AST 43 (*) 5 - 34 U/L   ALT 59 (*) 0 - 55 U/L   Total Protein 6.4  6.4 - 8.3 g/dL   Albumin 3.5  3.5 - 5.0 g/dL   Calcium 9.1  8.4 - 10.4 mg/dL   Anion Gap 13 (*) 3 - 11 mEq/L     Labs:  Lab Results  Component Value Date   WBC 6.4 04/09/2013   HGB 9.7* 04/09/2013   HCT 28.7* 04/09/2013   MCV 92.9 04/09/2013   PLT 107* 04/09/2013   NEUTROABS 4.4 04/09/2013      Chemistry      Component Value Date/Time   NA 141 04/09/2013 1014   NA 135* 03/24/2013 2030   K 3.6 04/09/2013 1014   K 4.8 03/24/2013 2030   CL 100 03/24/2013 2030   CL 106 07/09/2012 0949   CO2 24 04/09/2013 1014   CO2 21 03/24/2013 2030   BUN 17.0 04/09/2013 1014   BUN 32* 03/24/2013 2030   CREATININE 1.2* 04/09/2013 1014   CREATININE 1.11* 03/24/2013 2030      Component Value Date/Time   CALCIUM 9.1 04/09/2013 1014   CALCIUM 8.3* 03/24/2013 2030    ALKPHOS 76 04/09/2013 1014   ALKPHOS 84 05/14/2012 0910   AST 43* 04/09/2013 1014   AST 42* 05/14/2012 0910   ALT 59* 04/09/2013 1014   ALT 39* 05/14/2012 0910   BILITOT 0.31 04/09/2013 1014   BILITOT 0.2* 05/14/2012 0910     Basic Metabolic Panel:  Recent Labs Lab 04/09/13 1014  NA 141  K 3.6  CO2 24  GLUCOSE 236*  BUN 17.0  CREATININE 1.2*  CALCIUM 9.1   GFR Estimated Creatinine Clearance: 71.9 ml/min (by C-G formula based on Cr of 1.2). Liver Function Tests:  Recent Labs Lab 04/09/13 1014  AST 43*  ALT 59*  ALKPHOS 76  BILITOT 0.31  PROT 6.4  ALBUMIN 3.5   CBC:  Recent Labs Lab 04/09/13 1014  WBC 6.4  NEUTROABS 4.4  HGB 9.7*  HCT 28.7*  MCV 92.9  PLT 107*   Studies:  No results found.   RADIOGRAPHIC STUDIES: 1. CT angiogram of the chest on 06/17/2011 showed right-sided pulmonary emboli. There were mildly prominent mediastinal and right hilar lymph nodes.  2. CT-guided iliac bone aspiration and core biopsy were carried out on 06/19/2011.  3. CT of the head without IV contrast on 07/23/2011 showed that the patient was status post suboccipital craniotomy and right frontal bur hole placement. Otherwise negative noncontrast CT appearance of the brain.  4. Chest x-ray, 2 view, from 10/08/2011 was negative.  5. Chest x-ray, 2 view, on 02/06/2012 was negative.  6. CT angiogram of the chest on 02/06/2012 showed no evidence of pulmonary embolism or any other acute abnormalities. Lungs were clear.  7. Chest x-ray, 2 view, from 03/18/2012, showed no active cardiopulmonary disease.  8. CT scan of the head without IV contrast on 03/18/2012 showed no acute findings. No evidence of intracranial hemorrhage. There was evidence of the previous sub occipital craniotomy and right frontal burhole.  9. 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 CT scan of the head without IV contrast on 04/25/2012 showed no acute intracranial abnormality.  11. Chest x-ray, 2 view, on 05/13/2012 showed no acute disease in the chest.  12. CT angiogram of the chest on 05/13/2012 showed no evidence of pulmonary embolism. There was minimal bibasilar atelectasis. There was a vague 1.1-cm hypodensity within the right thyroid lobe. Consider further evaluation with thyroid ultrasound. 13. Chest x-ray, 2 view, from 12/03/2012, demonstrated increased lung markings in the retrocardiac region on the left suggests subsegmental atelectasis or early infiltrate. There is no pleural effusion. No pulmonary parenchymal mass is demonstrated. There is no evidence of CHF.  ASSESSMENT: Lynn [name] All 48 y.o. female with a history of Acute lymphoid leukemia - Plan: CBC with Differential, Comprehensive metabolic panel (Cmet) - [facility], Lactate dehydrogenase (LDH) - [facility], Magnesium - [facility]  Port-a-cath in place   PLAN:   1. ALL, relapsed s/p allogeneic stem cell transplant. Day 0 was 02/06/2013.  She is day 66.  Counts are stable.   --Patient has experienced relapsed disease as noted above.  She had her  2nd ASCT at [facility].  Her counts demonstrate recovery presently.    --She will continue on prophylaxis medications including acyclovir 800 mg bid, Bactrim DS on Mon, Wed, and Friday.  --She was instructed to contact us should she need any interval labs.  She will forward any neurological records and hematological records to our offices. --She has antiemetics ondansetron 8 mg prn and prochlorperazine 5 mg prn and promethazine 12.5 mg q 6 hours prn.   2. Chronic headaches. --She is following with neurology.  Continue imitrex 100 mg prn.   3. GERD. --Continue pantoprazole 40 mg daily.   4. History of pulmonary emboli involving the right upper and right lower lobes with positive  CT chest angiogram on 06/17/2011 and negative Dopplers.  --Placement of inferior vena cava Greenfield filter on 07/07/2011.   5. Development of subdural hematoma involving the posterior fossa when the patient was hospitalized at [facility], status post suboccipital craniotomy and evacuation of hematoma on 07/04/2011.   6. History of hypertension.  -- Observing off medication.   7. Elevated liver enzymes.  --We will continue to trend and avoid hepatoxins.   8. Follow-up --We will plan to see her again in 1 month at which time we will check her CBC, chemistries, LDH.  Previously she was treated according to protocol CALGB 96045.    All questions were answered. The patient knows to call the clinic with any problems, questions or concerns. We can certainly see the patient much sooner if necessary.  I spent 15 minutes counseling the patient face to face. The total time spent in the appointment was 25 minutes.    , , MD 04/11/2013 8:31 AM

## 2013-04-15 ENCOUNTER — Encounter: Payer: Self-pay | Admitting: Medical Oncology

## 2013-04-16 ENCOUNTER — Ambulatory Visit (HOSPITAL_BASED_OUTPATIENT_CLINIC_OR_DEPARTMENT_OTHER): Payer: Medicaid Other

## 2013-04-16 ENCOUNTER — Telehealth: Payer: Self-pay | Admitting: Internal Medicine

## 2013-04-16 ENCOUNTER — Other Ambulatory Visit: Payer: Self-pay | Admitting: Internal Medicine

## 2013-04-16 ENCOUNTER — Non-Acute Institutional Stay (HOSPITAL_COMMUNITY): Admit: 2013-04-16 | Payer: Self-pay | Admitting: Internal Medicine

## 2013-04-16 ENCOUNTER — Telehealth: Payer: Self-pay

## 2013-04-16 ENCOUNTER — Ambulatory Visit: Payer: Medicaid Other

## 2013-04-16 ENCOUNTER — Non-Acute Institutional Stay (HOSPITAL_COMMUNITY)
Admission: AD | Admit: 2013-04-16 | Discharge: 2013-04-16 | Disposition: A | Discharge status: Home / Self Care | Payer: Medicaid Other | Source: Ambulatory Visit | Attending: Internal Medicine | Admitting: Internal Medicine

## 2013-04-16 ENCOUNTER — Other Ambulatory Visit: Payer: Self-pay

## 2013-04-16 DIAGNOSIS — C9100 Acute lymphoblastic leukemia not having achieved remission: Secondary | ICD-10-CM

## 2013-04-16 DIAGNOSIS — C9112 Chronic lymphocytic leukemia of B-cell type in relapse: Secondary | ICD-10-CM

## 2013-04-16 DIAGNOSIS — E119 Type 2 diabetes mellitus without complications: Secondary | ICD-10-CM

## 2013-04-16 DIAGNOSIS — C9102 Acute lymphoblastic leukemia, in relapse: Secondary | ICD-10-CM | POA: Insufficient documentation

## 2013-04-16 DIAGNOSIS — R748 Abnormal levels of other serum enzymes: Secondary | ICD-10-CM | POA: Insufficient documentation

## 2013-04-16 DIAGNOSIS — I10 Essential (primary) hypertension: Secondary | ICD-10-CM | POA: Insufficient documentation

## 2013-04-16 DIAGNOSIS — R51 Headache: Secondary | ICD-10-CM | POA: Insufficient documentation

## 2013-04-16 DIAGNOSIS — C9101 Acute lymphoblastic leukemia, in remission: Secondary | ICD-10-CM

## 2013-04-16 DIAGNOSIS — Z86711 Personal history of pulmonary embolism: Secondary | ICD-10-CM | POA: Insufficient documentation

## 2013-04-16 DIAGNOSIS — K219 Gastro-esophageal reflux disease without esophagitis: Secondary | ICD-10-CM | POA: Insufficient documentation

## 2013-04-16 LAB — CBC WITH DIFFERENTIAL/PLATELET
BASO%: 0.6 % (ref 0.0–2.0)
Basophils Absolute: 0 10*3/uL (ref 0.0–0.1)
EOS%: 1 % (ref 0.0–7.0)
Eosinophils Absolute: 0.1 10*3/uL (ref 0.0–0.5)
HEMATOCRIT: 29.7 % — AB (ref 34.8–46.6)
HGB: 9.9 g/dL — ABNORMAL LOW (ref 11.6–15.9)
LYMPH#: 1.8 10*3/uL (ref 0.9–3.3)
LYMPH%: 24.8 % (ref 14.0–49.7)
MCH: 32.3 pg (ref 25.1–34.0)
MCHC: 33.4 g/dL (ref 31.5–36.0)
MCV: 96.6 fL (ref 79.5–101.0)
MONO#: 0.6 10*3/uL (ref 0.1–0.9)
MONO%: 9.1 % (ref 0.0–14.0)
NEUT#: 4.5 10*3/uL (ref 1.5–6.5)
NEUT%: 64.5 % (ref 38.4–76.8)
Platelets: 155 10*3/uL (ref 145–400)
RBC: 3.07 10*6/uL — AB (ref 3.70–5.45)
RDW: 24.9 % — AB (ref 11.2–14.5)
WBC: 7.1 10*3/uL (ref 3.9–10.3)

## 2013-04-16 LAB — COMPREHENSIVE METABOLIC PANEL (CC13)
ALT: 39 U/L (ref 0–55)
ANION GAP: 12 meq/L — AB (ref 3–11)
AST: 24 U/L (ref 5–34)
Albumin: 3.4 g/dL — ABNORMAL LOW (ref 3.5–5.0)
Alkaline Phosphatase: 92 U/L (ref 40–150)
BUN: 21.6 mg/dL (ref 7.0–26.0)
CALCIUM: 9.1 mg/dL (ref 8.4–10.4)
CHLORIDE: 107 meq/L (ref 98–109)
CO2: 20 meq/L — AB (ref 22–29)
Creatinine: 1.3 mg/dL — ABNORMAL HIGH (ref 0.6–1.1)
Glucose: 263 mg/dl — ABNORMAL HIGH (ref 70–140)
POTASSIUM: 3.9 meq/L (ref 3.5–5.1)
SODIUM: 139 meq/L (ref 136–145)
TOTAL PROTEIN: 6.6 g/dL (ref 6.4–8.3)
Total Bilirubin: 0.24 mg/dL (ref 0.20–1.20)

## 2013-04-16 LAB — MAGNESIUM (CC13): Magnesium: 1.8 mg/dl (ref 1.5–2.5)

## 2013-04-16 MED ORDER — SODIUM CHLORIDE 0.9 % IV SOLN
2.0000 g | Freq: Once | INTRAVENOUS | Status: DC
  Filled 2013-04-16: qty 4

## 2013-04-16 MED ORDER — SODIUM CHLORIDE 0.9 % IV SOLN
INTRAVENOUS | Status: DC
  Administered 2013-04-16: 14:00:00 via INTRAVENOUS

## 2013-04-16 MED ORDER — SODIUM CHLORIDE 0.9 % IJ SOLN
INTRAMUSCULAR | Status: AC
  Filled 2013-04-16: qty 10

## 2013-04-16 MED ORDER — SODIUM CHLORIDE 0.9 % IV SOLN: Freq: Once | INTRAVENOUS | Status: DC

## 2013-04-16 NOTE — Telephone Encounter (Signed)
S/w pt that we can do labs today and fluid. Pt to come in at 1215. She stated she could POF to scheduler. S/w [facility] and they can do the infusion today.

## 2013-04-16 NOTE — H&P (Signed)
For normal saline and magnesium support only.

## 2013-04-16 NOTE — Discharge Instructions (Signed)
Acute Lymphoblastic Leukemia, Adult Acute lymphoblastic leukemia (ALL) is a rapid-growth cancer of the blood and the soft tissue inside your bones (bone marrow). Normally, bone marrow makes blast cells that develop into important immune cells called lymphocytes or other mature blood cells (red, platelet, or white). These mature cells help to fight infection, carry oxygen, and stop bleeding. With ALL, the bone marrow makes abnormal, or unformed blast cells. These abnormal cells develop into leukemia cells and occupy space in the blood where healthy cells need room. The rapidly growing leukemia cells begin to take over. Leukemia cells do not fight infection or carry out other important jobs in the blood, and symptoms of infection and illness appear. There are several types of ALL. They vary depending on the size and number of leukemia cells and which healthy cells are most affected. Adult ALL is extremely rare. Most cases occur in children, but ALL can also occur in adults. CAUSES  Experts are not clear on what causes ALL in many cases. With ALL, the DNA in the bone marrow is damaged causing it to form abnormal cells. For the most part, it does not appear to be genetic, but related to other external factors.  RISK FACTORS  Female.  Caucasian.  Age older than 54 years.  History of chemotherapy or radiation therapy.  Exposure to radiation from [place].  Certain genetic disorders, such as Down syndrome. SIGNS AND SYMPTOMS   Tiring easily.  Weakness.  Fever.  Night sweats.  Repeat infections.  Flat spots under the skin.  Shortness of breath.  Weight loss.  Loss of appetite.  Abdominal pain.  Bone pain or aches.  Joint pain or aches.  Pale skin.  Painless lumps in the neck, underarm, stomach, or groin.  Nosebleeds and easy bleeding from minor cuts.  Swollen glands. DIAGNOSIS  The diagnosis of ALL is made by tests such as:  Blood tests to check blood cell counts and  the shape of the blood cells (morphology).  Bone marrow sampling test.  Genetic testing.  X-ray exams, ultrasonography, or CT imaging tests.  Spinal fluid tests to check for leukemia cells. TREATMENT  The type of ALL diagnosed will guide treatment options. Treatment can last up to 2 3 years and aims to destroy leukemia cells as well as stop new diseased cells from growing. Treatment may include:  Chemotherapy.  Radiation therapy to kill cancer cells.  Targeted medicines to treat specific chromosomal mutations.  Stem cell transplant to replace diseased bone marrow with healthy donor bone marrow.  Experimental treatments through clinical trials.  In rare cases, surgery. HOME CARE INSTRUCTIONS  When you are on chemotherapy:  You and any visitors should wash hands often, especially before meals, after being outside, and after using the toilet.  Keep your teeth and gums clean and well cared for. Use soft toothbrushes.  When visiting a healthcare facility, ask about side entrances or waiting areas where you will not be exposed to infections.  Only take over-the-counter or prescription medicines for pain, discomfort, or fever as directed by your health care provider.  Use a good sunblock and clothing to prevent sun exposure.  Usually, it is recommended that other family members receive an influenza shot every year. SEEK MEDICAL CARE IF:  You have a cough or cold symptoms.  You have a sore throat.  You have painful urination.  You have frequent diarrhea.  You have frequent vomiting.  You have a skin rash.  Your have been exposed to chickenpox  or measles, especially if you have not been immunized or are not immune to these illnesses. SEEK IMMEDIATE MEDICAL CARE IF:  You have a fever.  You have chills.  You have trouble breathing.  You have blood in urine or stools. Document Released: 11/06/2012 Document Reviewed: 09/09/2012 [facility] Patient Information 2014  [place], [place].

## 2013-04-16 NOTE — Telephone Encounter (Signed)
Lynn [name] had called late Monday asking if we can draw labs for her. She said she has been going to [facility] twice a week and getting labs. Sometimes she would get fluids, or magnesium or insulin. I told her to continue her visits to [facility] until we can get this situated.  I called [facility] this AM to get orders from them if we can help them.

## 2013-04-16 NOTE — Telephone Encounter (Signed)
, °

## 2013-04-16 NOTE — Progress Notes (Signed)
[facility]  Procedure Note  Lynn [name] RZN:[id number] DOB: 09-Sep-1965 DOA: 04/16/2013   Ordering Physician: Dr. Juliann Mule  Associated Diagnosis: Leukemia, acute lymphoid   Procedure Note: Received 1 liter NS with no complications to Right AC IV   Condition During Procedure: Patient tolerated well.    Condition at Discharge: Patient tolerated well. No complaints. Patient in no apparent distress. Patient left day hospital with belongings ambulatory via self.    Wendie Simmer, RN  [facility]

## 2013-04-21 ENCOUNTER — Telehealth: Payer: Self-pay | Admitting: Medical Oncology

## 2013-04-21 NOTE — Telephone Encounter (Signed)
Pt called in this am stating she is having to go to [facility] twice a week for labs. She was told she could come on Mondays to get labs here and then go to Dr. Jolyn Nap office on Friday. I called [facility] and I spoke with Maudie Mercury to obtain orders. She checked with Dr. Harvel Ricks and he said she only had to get labs once a week and they will check them on Fridays. I called pt and she is aware.

## 2013-04-30 LAB — ASPERGILLUS ANTIGEN,SERUM
Aspergillus Ag, EIA: NOT DETECTED
Index Value: 0.13 (ref <0.50)

## 2013-05-06 NOTE — Telephone Encounter (Signed)
Closing encounter

## 2013-05-08 ENCOUNTER — Telehealth: Payer: Self-pay | Admitting: Internal Medicine

## 2013-05-08 NOTE — Telephone Encounter (Signed)
pt called to r/s appt due to having an appt in [place] same day...done...pt aware of new d.t

## 2013-05-12 ENCOUNTER — Telehealth: Payer: Self-pay | Admitting: *Deleted

## 2013-05-12 NOTE — Telephone Encounter (Signed)
This is a new symptom. Will need to see Dr. Leonie Man or [facility]. -VRP

## 2013-05-13 ENCOUNTER — Ambulatory Visit: Payer: Medicaid Other

## 2013-05-13 ENCOUNTER — Other Ambulatory Visit: Payer: Medicaid Other

## 2013-05-13 ENCOUNTER — Telehealth: Payer: Self-pay | Admitting: Nurse Practitioner

## 2013-05-13 NOTE — Telephone Encounter (Signed)
Spoke with patient and informed that since this is a new symptom, would need a new referral first from pcp, then will schedule.patient verbalized understanding

## 2013-05-13 NOTE — Telephone Encounter (Signed)
Patient returning Aimee's call.

## 2013-05-28 ENCOUNTER — Other Ambulatory Visit (HOSPITAL_BASED_OUTPATIENT_CLINIC_OR_DEPARTMENT_OTHER): Payer: Medicaid Other

## 2013-05-28 ENCOUNTER — Ambulatory Visit (HOSPITAL_BASED_OUTPATIENT_CLINIC_OR_DEPARTMENT_OTHER): Payer: Medicaid Other | Admitting: Internal Medicine

## 2013-05-28 ENCOUNTER — Telehealth: Payer: Self-pay | Admitting: Internal Medicine

## 2013-05-28 VITALS — BP 121/64 | HR 125 | Temp 98.2°F | Resp 19 | Ht 66.0 in | Wt 239.1 lb

## 2013-05-28 DIAGNOSIS — C9102 Acute lymphoblastic leukemia, in relapse: Secondary | ICD-10-CM

## 2013-05-28 DIAGNOSIS — C9100 Acute lymphoblastic leukemia not having achieved remission: Secondary | ICD-10-CM

## 2013-05-28 DIAGNOSIS — R51 Headache: Secondary | ICD-10-CM

## 2013-05-28 DIAGNOSIS — J189 Pneumonia, unspecified organism: Secondary | ICD-10-CM

## 2013-05-28 DIAGNOSIS — Z86711 Personal history of pulmonary embolism: Secondary | ICD-10-CM

## 2013-05-28 LAB — MAGNESIUM (CC13): Magnesium: 1.8 mg/dl (ref 1.5–2.5)

## 2013-05-28 LAB — COMPREHENSIVE METABOLIC PANEL (CC13)
ALT: 81 U/L — AB (ref 0–55)
ANION GAP: 13 meq/L — AB (ref 3–11)
AST: 55 U/L — ABNORMAL HIGH (ref 5–34)
Albumin: 3.2 g/dL — ABNORMAL LOW (ref 3.5–5.0)
Alkaline Phosphatase: 369 U/L — ABNORMAL HIGH (ref 40–150)
BUN: 13.2 mg/dL (ref 7.0–26.0)
CALCIUM: 9.8 mg/dL (ref 8.4–10.4)
CHLORIDE: 105 meq/L (ref 98–109)
CO2: 19 meq/L — AB (ref 22–29)
Creatinine: 1.4 mg/dL — ABNORMAL HIGH (ref 0.6–1.1)
Glucose: 290 mg/dl — ABNORMAL HIGH (ref 70–140)
POTASSIUM: 4.3 meq/L (ref 3.5–5.1)
SODIUM: 137 meq/L (ref 136–145)
TOTAL PROTEIN: 7 g/dL (ref 6.4–8.3)
Total Bilirubin: 0.34 mg/dL (ref 0.20–1.20)

## 2013-05-28 LAB — CBC WITH DIFFERENTIAL/PLATELET
BASO%: 0.7 % (ref 0.0–2.0)
Basophils Absolute: 0.1 10*3/uL (ref 0.0–0.1)
EOS%: 2.2 % (ref 0.0–7.0)
Eosinophils Absolute: 0.3 10*3/uL (ref 0.0–0.5)
HCT: 31.1 % — ABNORMAL LOW (ref 34.8–46.6)
HGB: 10.5 g/dL — ABNORMAL LOW (ref 11.6–15.9)
LYMPH#: 1.1 10*3/uL (ref 0.9–3.3)
LYMPH%: 8.5 % — ABNORMAL LOW (ref 14.0–49.7)
MCH: 33.8 pg (ref 25.1–34.0)
MCHC: 33.8 g/dL (ref 31.5–36.0)
MCV: 100 fL (ref 79.5–101.0)
MONO#: 1.2 10*3/uL — AB (ref 0.1–0.9)
MONO%: 9.5 % (ref 0.0–14.0)
NEUT#: 10.1 10*3/uL — ABNORMAL HIGH (ref 1.5–6.5)
NEUT%: 79.1 % — ABNORMAL HIGH (ref 38.4–76.8)
Platelets: 287 10*3/uL (ref 145–400)
RBC: 3.11 10*6/uL — AB (ref 3.70–5.45)
RDW: 14.5 % (ref 11.2–14.5)
WBC: 12.8 10*3/uL — ABNORMAL HIGH (ref 3.9–10.3)

## 2013-05-28 LAB — LACTATE DEHYDROGENASE (CC13): LDH: 305 U/L — ABNORMAL HIGH (ref 125–245)

## 2013-05-28 NOTE — Progress Notes (Signed)
[place], [address] [place] [address]  DIAGNOSIS: Acute lymphoid leukemia - Plan: CBC with Differential, Comprehensive metabolic panel (Cmet) - [facility], Magnesium - [facility]  Leukemia, acute lymphoid  Pneumonia  Chief Complaint  Patient presents with  . Follow-up   CURRENT THERAPY:   Dasatinib 50 mg daily, which was started on 05/31/2012. The patient had been on Dasatinib 100 mg daily at least since 12/15/2011. The dose was reduced because of neutropenia. The patient also received dasatinib during induction treatment which dates back to May 2013.  She had relapse on Sept 22 and was hospitalized until Oct 31.  She underwent Allogeneic SCT (9/10 match with mismatch at DQ) on January 8th at [facility] (Dr. Harvel Ricks).  INTERVAL HISTORY: Lynn [name] 48 y.o. 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  She was last seen by me on 04/09/2013.  She is being managed by [facility].  She underwent Allogeneic stem cell trasnplant on 02/06/2013.  She was admitted on 01/28/2013 for prepartive regimen of Busulfan/CTX.  Her post transplant course was complicated by c. Difficile positive diarrhea treated with oral vancomycin, pneumonia likely bacterial.  CXR with new patchy opacities treated with zosyn and moxifloxacin and acute kidney injury secondary to dehydration from diarrhea and ATN which improved with hydration.  Her WBC engrafted on 02/24/2013 and platelets on 02/20/2013.    She is day +112 post transplant today.  She last saw Dr. Harvel Ricks on yesterday.  Transplant wise, she reports things are going well.  She started a cough last weekend and random fever of 100.4 with increased shortness of breath with exertion and nausea.   Her glucose monitor also ran high.  They referred her to an endocrinologist center at [facility] but she prefers a local endocrinologist.  She had a CT of sinuses and Chest which revealed PNA on  the right.  She is on antibiotics including levaquin 750 mg daily for fourteen days.  She on day #2 of 14.  She denies any changes in her symptoms.  She did have a temperature of 99.8 yesterday.  She has been off steroids for some time. She will see her PCP of [facility] in the next few days for a referral to endocrinology.  She also reports seeing a neurologist (Dr. Phil Dopp) for tingling in her thighs. She also reports elevated glucose of 400s and receiving insulin.   MEDICAL HISTORY: Past Medical History  Diagnosis Date  . Hypertension   . Brain bleed     07/04/11  . Pulmonary embolism     06/22/11  . Headache(784.0)   . Arthritis   . Anemia   . ALL (acute lymphoblastic leukemia)   . Leukemia     INTERIM HISTORY: has Microcytosis; Acute pulmonary embolism; Leukocytosis; Thrombocytopenia; Anemia; Hilar lymphadenopathy; Leukemia, acute lymphoid; Acute lymphoid leukemia; Acute lymphoid leukemia in remission; Transfusion reaction; Viral meningitis; Encounter for antineoplastic chemotherapy; Essential hypertension; History of peripheral stem cell transplant; Subdural hemorrhage; Neutropenia; Fever; Right thyroid nodule; Port-a-cath in place; History of pulmonary embolism; Greenfield filter in place; and Pneumonia on her problem list.    ALLERGIES:  is allergic to other.  MEDICATIONS: has a current medication list which includes the following prescription(s): acyclovir, amitriptyline, amlodipine, ra blood glucose monitor, fluconazole, folic acid, levofloxacin, lidocaine-prilocaine, magnesium chloride, metformin, multivitamin, ponatinib hcl, prochlorperazine, promethazine, ranitidine, magnesium (amino acid chelate), sulfamethoxazole-trimethoprim, sumatriptan, tacrolimus, and tramadol.  SURGICAL HISTORY:  Past Surgical History  Procedure Laterality Date  . Cesarean section    . Foot surgery    . Foot surgery    . Cesarean section    . Eye surgery    . Craniotomy    . Greenfield filter     PROBLEM  LIST:  1. B-cell acute lymphoblastic leukemia, Philadelphia chromosome positive, t(9; 22) initially with 71% blasts seen on bone marrow carried out on Jun 19, 2011. Subsequent bone marrow at [facility] on 06/22/2011 showed 98% blasts and peripheral blood showed 71% blasts. The patient received induction treatment with dasatinib (Sprycel) and Decadron. Her hospital course was complicated by the development of a posterior fossa subdural hematoma requiring a suboccipital craniotomy and evacuation of the hematoma on 07/04/2011. The patient also had neutropenic fever with negative cultures covered with broad-spectrum antibiotics. She had some headaches, vaginal bleeding and transaminitis. Admission to [facility] was from 06/21/2011 through 07/17/2011. Bone marrow carried out on 07/06/2011 showed a hypocellular bone marrow with no evidence for ALL. The patient was enrolled on protocol CALGB 02334. That protocol consisted of treatment with dasatinib along with chemotherapy. A bone marrow carried out on 07/28/2011 showed pan hypoplasia with no evidence for acute leukemia. Cytogenetics were normal. FISH studies were negative for t(9; 22). PCR for small p190 in both the peripheral blood and bone marrow returned negative at 0.000. On 08/08/2011 the patient received high-dose methotrexate with leucovorin rescue. She also received IV vincristine 2.0 mg and CNS prophylaxis with intrathecal methotrexate/hydrocortisone. CSF was negative. On 09/18/2011 bone marrow biopsy showed no evidence of ALL. PCR for BCR/ABL showed 0.00001 fusion events. It was felt that the patient had achieved a complete hematologic response. On 09/26/2011 mobilization therapy with cytarabine/etoposide was started The patient was admitted to the hospital from 11/14/2011 through 11/28/2011, at which time she received high-dose therapy and her autologous stem cell transplant. She received  melphalan on days -2 and days -1. Day 0 was 11/16/2011. The patient also received G-CSF. She had severe mucositis and dysphagia on 11/20/2011 which required morphine by PCA. She had some fevers on 11/23/2011. She was treated with antibiotics for prophylaxis. She had some headaches, but those have subsequently resolved. Venous Dopplers of her lower extremities were done bilaterally because of some swelling of her legs. These were negative for blood clots. It will be recalled that the patient does have an inferior vena cava Greenfield filter that was placed on 07/07/2011 for her prior history of pulmonary emboli. As stated, the patient was discharged from [facility] on October 29th. Solene tells me that she was restarted on dasatinib 100 mg daily in mid November. Records from [facility] indicate that bone marrows carried out on 10/16/2011, 11/02/2011, 12/15/2011, 03/08/2012 and 05/31/2012 were negative for any signs of leukemia.  2. Pulmonary emboli involving the right upper and right lower lobes with positive CT chest angiogram on 06/17/2011 and negative Dopplers.  3. Development of subdural hematoma involving the posterior fossa when the patient was hospitalized at [facility], status post suboccipital craniotomy and evacuation of hematoma on 07/04/2011.  4. History of hypertension.  5. Morbid obesity.  6. Arthritis involving the right hip.  7. History of palpitations.  8. History of migraine headaches.  9. Placement of inferior vena cava Greenfield filter on 07/07/2011.  10. Mild renal insufficiency.  11. Possible 1.1 cm, hypodense lesion in the right  thyroid lobe noted on CT angiogram of the chest from 05/13/2012.  12. Right-sided double lumen Port-A-Cath placed at [facility] on 07/12/2011.   REVIEW OF SYSTEMS:   Constitutional: Denies fevers, chills or abnormal  weight loss; reports some periods of shakiness due to low blood glucose. Eyes: Denies blurriness of vision Ears, nose, mouth, throat, and face: Denies mucositis or sore throat Respiratory: Denies cough, dyspnea or wheezes Cardiovascular: Denies palpitation, chest discomfort or lower extremity swelling Gastrointestinal:  Denies nausea, heartburn or change in bowel habits Skin: Reports mild skin hypopigmentation since last transplant.  Lymphatics: Denies new lymphadenopathy or easy bruising Neurological:Denies numbness, tingling or new weaknesses Behavioral/Psych: Mood is stable, no new changes  All other systems were reviewed with the patient and are negative.  PHYSICAL EXAMINATION: ECOG PERFORMANCE STATUS: 0 - Asymptomatic  Blood pressure 121/64, pulse 125, temperature 98.2 F (36.8 C), temperature source Oral, resp. rate 19, height '5\' 6"'  (1.676 m), weight 239 lb 1.6 oz (108.455 kg), SpO2 100.00%.  GENERAL:alert, no distress and comfortable; chronically ill appearing woman with alopecia; moderately obese SKIN: skin color, texture, turgor are normal, no rashes or significant lesions EYES: normal, Conjunctiva are pink and non-injected, sclera clear OROPHARYNX:no exudate, no erythema and lips, buccal mucosa, and tongue with dark pigmentation.  NECK: supple, thyroid normal size, non-tender, without nodularity LYMPH:  no palpable lymphadenopathy in the cervical, axillary or supraclavicular LUNGS: clear to auscultation and percussion with normal breathing effort HEART: regular rate & rhythm and no murmurs and no lower extremity edema ABDOMEN:abdomen soft, non-tender and normal bowel sounds Musculoskeletal:no cyanosis of digits and no clubbing  NEURO: alert & oriented x 3 with fluent speech, no focal motor/sensory deficits  LABORATORY DATA: Results for orders placed in visit on 05/28/13 (from the past 48 hour(s))  CBC WITH DIFFERENTIAL     Status: Abnormal   Collection Time    05/28/13   9:27 AM      Result Value Ref Range   WBC 12.8 (*) 3.9 - 10.3 10e3/uL   NEUT# 10.1 (*) 1.5 - 6.5 10e3/uL   HGB 10.5 (*) 11.6 - 15.9 g/dL   HCT 31.1 (*) 34.8 - 46.6 %   Platelets 287  145 - 400 10e3/uL   MCV 100.0  79.5 - 101.0 fL   MCH 33.8  25.1 - 34.0 pg   MCHC 33.8  31.5 - 36.0 g/dL   RBC 3.11 (*) 3.70 - 5.45 10e6/uL   RDW 14.5  11.2 - 14.5 %   lymph# 1.1  0.9 - 3.3 10e3/uL   MONO# 1.2 (*) 0.1 - 0.9 10e3/uL   Eosinophils Absolute 0.3  0.0 - 0.5 10e3/uL   Basophils Absolute 0.1  0.0 - 0.1 10e3/uL   NEUT% 79.1 (*) 38.4 - 76.8 %   LYMPH% 8.5 (*) 14.0 - 49.7 %   MONO% 9.5  0.0 - 14.0 %   EOS% 2.2  0.0 - 7.0 %   BASO% 0.7  0.0 - 2.0 %  MAGNESIUM (CC13)     Status: None   Collection Time    05/28/13  9:27 AM      Result Value Ref Range   Magnesium 1.8  1.5 - 2.5 mg/dl  COMPREHENSIVE METABOLIC PANEL (YI01)     Status: Abnormal   Collection Time    05/28/13  9:28 AM      Result Value Ref Range   Sodium 137  136 - 145 mEq/L   Potassium 4.3  3.5 - 5.1  mEq/L   Chloride 105  98 - 109 mEq/L   CO2 19 (*) 22 - 29 mEq/L   Glucose 290 (*) 70 - 140 mg/dl   BUN 13.2  7.0 - 26.0 mg/dL   Creatinine 1.4 (*) 0.6 - 1.1 mg/dL   Total Bilirubin 0.34  0.20 - 1.20 mg/dL   Alkaline Phosphatase 369 (*) 40 - 150 U/L   AST 55 (*) 5 - 34 U/L   ALT 81 (*) 0 - 55 U/L   Total Protein 7.0  6.4 - 8.3 g/dL   Albumin 3.2 (*) 3.5 - 5.0 g/dL   Calcium 9.8  8.4 - 10.4 mg/dL   Anion Gap 13 (*) 3 - 11 mEq/L     Labs:  Lab Results  Component Value Date   WBC 12.8* 05/28/2013   HGB 10.5* 05/28/2013   HCT 31.1* 05/28/2013   MCV 100.0 05/28/2013   PLT 287 05/28/2013   NEUTROABS 10.1* 05/28/2013      Chemistry      Component Value Date/Time   NA 137 05/28/2013 0928   NA 135* 03/24/2013 2030   K 4.3 05/28/2013 0928   K 4.8 03/24/2013 2030   CL 100 03/24/2013 2030   CL 106 07/09/2012 0949   CO2 19* 05/28/2013 0928   CO2 21 03/24/2013 2030   BUN 13.2 05/28/2013 0928   BUN 32* 03/24/2013 2030   CREATININE 1.4*  05/28/2013 0928   CREATININE 1.11* 03/24/2013 2030      Component Value Date/Time   CALCIUM 9.8 05/28/2013 0928   CALCIUM 8.3* 03/24/2013 2030   ALKPHOS 369* 05/28/2013 0928   ALKPHOS 84 05/14/2012 0910   AST 55* 05/28/2013 0928   AST 42* 05/14/2012 0910   ALT 81* 05/28/2013 0928   ALT 39* 05/14/2012 0910   BILITOT 0.34 05/28/2013 0928   BILITOT 0.2* 05/14/2012 0910     Basic Metabolic Panel:  Recent Labs Lab 05/28/13 0927 05/28/13 0928  NA  --  137  K  --  4.3  CO2  --  19*  GLUCOSE  --  290*  BUN  --  13.2  CREATININE  --  1.4*  CALCIUM  --  9.8  MG 1.8  --    GFR Estimated Creatinine Clearance: 61.3 ml/min (by C-G formula based on Cr of 1.4). Liver Function Tests:  Recent Labs Lab 05/28/13 0928  AST 55*  ALT 81*  ALKPHOS 369*  BILITOT 0.34  PROT 7.0  ALBUMIN 3.2*   CBC:  Recent Labs Lab 05/28/13 0927  WBC 12.8*  NEUTROABS 10.1*  HGB 10.5*  HCT 31.1*  MCV 100.0  PLT 287   Studies:  No results found.   RADIOGRAPHIC STUDIES: 1. CT angiogram of the chest on 06/17/2011 showed right-sided pulmonary emboli. There were mildly prominent mediastinal and right hilar lymph nodes.  2. CT-guided iliac bone aspiration and core biopsy were carried out on 06/19/2011.  3. CT of the head without IV contrast on 07/23/2011 showed that the patient was status post suboccipital craniotomy and right frontal bur hole placement. Otherwise negative noncontrast CT appearance of the brain.  4. Chest x-ray, 2 view, from 10/08/2011 was negative.  5. Chest x-ray, 2 view, on 02/06/2012 was negative.  6. CT angiogram of the chest on 02/06/2012 showed no evidence of pulmonary embolism or any other acute abnormalities. Lungs were clear.  7. Chest x-ray, 2 view, from 03/18/2012, showed no active cardiopulmonary disease.  8. CT scan of the head without IV contrast on  03/18/2012 showed no acute findings. No evidence of intracranial hemorrhage. There was evidence of the previous sub occipital  craniotomy and right frontal burhole.  9. 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 CT scan of the head without IV contrast on 04/25/2012 showed no acute intracranial abnormality.  11. Chest x-ray, 2 view, on 05/13/2012 showed no acute disease in the chest.  12. CT angiogram of the chest on 05/13/2012 showed no evidence of pulmonary embolism. There was minimal bibasilar atelectasis. There was a vague 1.1-cm hypodensity within the right thyroid lobe. Consider further evaluation with thyroid ultrasound. 13. Chest x-ray, 2 view, from 12/03/2012, demonstrated increased lung markings in the retrocardiac region on the left suggests subsegmental atelectasis or early infiltrate. There is no pleural effusion. No pulmonary parenchymal mass is demonstrated. There is no evidence of CHF.  ASSESSMENT: Everardo All 48 y.o. female with a history of Acute lymphoid leukemia - Plan: CBC with Differential, Comprehensive metabolic panel (Cmet) - [facility], Magnesium - [facility]  Leukemia, acute lymphoid  Pneumonia   PLAN:   1. ALL, relapsed s/p allogeneic stem cell transplant. Day 0 was 02/06/2013.  She is day 112.  Counts are stable.   --Patient has experienced relapsed disease as noted above.  She had her  2nd AS[facility] at [facility].  Her counts demonstrate recovery presently.    --She will continue on prophylaxis medications including acyclovir 800 mg bid, Bactrim DS on Mon, Wed, and Friday.  --She was instructed to contact us should she need any interval labs.  She will forward any neurological records and hematological records to our offices. --She has antiemetics ondansetron 8 mg prn and prochlorperazine 5 mg prn and promethazine 12.5 mg q 6 hours prn.   2. Pneumonia, likely  atypical. --Continue Levaquin 750 mg. Today, is #2 of 14.  3. Chronic headaches plus leg tingling. --She is following with neurology.  Continue imitrex 100 mg prn.  --New referral for leg tingling   4. GERD. --Continue pantoprazole 40 mg daily.   5. History of pulmonary emboli involving the right upper and right lower lobes with positive CT chest angiogram on 06/17/2011 and negative Dopplers.  --Placement of inferior vena cava Greenfield filter on 07/07/2011.   6. Development of subdural hematoma involving the posterior fossa when the patient was hospitalized at [facility], status post suboccipital craniotomy and evacuation of hematoma on 07/04/2011.   7. History of hypertension.  -- Observing off medication.   8. Elevated liver enzymes.  --We will continue to trend and avoid hepatoxins.   9. Hyperglycemia, likely DM2 --She will have a referral.   10. Follow-up --We will plan to see her again in 1 month at which time we will check her CBC, chemistries, LDH.  Previously she was treated according to protocol CALGB 29518.    All questions were answered. The patient knows to call the clinic with any problems, questions or concerns. We can certainly see the patient much sooner if necessary.  I spent 15 minutes counseling the patient face to face. The total time spent in the appointment was 25 minutes.    Concha Norway, MD 05/28/2013 10:29 AM

## 2013-05-28 NOTE — Telephone Encounter (Signed)
Gave pt appt for lab and MD for MAy 2015 °

## 2013-05-30 ENCOUNTER — Emergency Department (HOSPITAL_COMMUNITY): Payer: Medicaid Other

## 2013-05-30 ENCOUNTER — Other Ambulatory Visit: Payer: Self-pay

## 2013-05-30 ENCOUNTER — Inpatient Hospital Stay (HOSPITAL_COMMUNITY)
Admission: EM | Admit: 2013-05-30 | Discharge: 2013-06-03 | DRG: 193 | Disposition: A | Discharge status: Home / Self Care | Payer: Medicaid Other | Attending: Internal Medicine | Admitting: Internal Medicine

## 2013-05-30 ENCOUNTER — Encounter (HOSPITAL_COMMUNITY): Payer: Self-pay | Admitting: Emergency Medicine

## 2013-05-30 DIAGNOSIS — R51 Headache: Secondary | ICD-10-CM | POA: Diagnosis present

## 2013-05-30 DIAGNOSIS — D709 Neutropenia, unspecified: Secondary | ICD-10-CM

## 2013-05-30 DIAGNOSIS — C9100 Acute lymphoblastic leukemia not having achieved remission: Secondary | ICD-10-CM

## 2013-05-30 DIAGNOSIS — A879 Viral meningitis, unspecified: Secondary | ICD-10-CM

## 2013-05-30 DIAGNOSIS — E1110 Type 2 diabetes mellitus with ketoacidosis without coma: Secondary | ICD-10-CM

## 2013-05-30 DIAGNOSIS — I129 Hypertensive chronic kidney disease with stage 1 through stage 4 chronic kidney disease, or unspecified chronic kidney disease: Secondary | ICD-10-CM | POA: Diagnosis present

## 2013-05-30 DIAGNOSIS — Z6837 Body mass index (BMI) 37.0-37.9, adult: Secondary | ICD-10-CM

## 2013-05-30 DIAGNOSIS — R590 Localized enlarged lymph nodes: Secondary | ICD-10-CM

## 2013-05-30 DIAGNOSIS — R748 Abnormal levels of other serum enzymes: Secondary | ICD-10-CM | POA: Diagnosis present

## 2013-05-30 DIAGNOSIS — Z95828 Presence of other vascular implants and grafts: Secondary | ICD-10-CM

## 2013-05-30 DIAGNOSIS — R209 Unspecified disturbances of skin sensation: Secondary | ICD-10-CM | POA: Diagnosis present

## 2013-05-30 DIAGNOSIS — D696 Thrombocytopenia, unspecified: Secondary | ICD-10-CM | POA: Diagnosis present

## 2013-05-30 DIAGNOSIS — R7989 Other specified abnormal findings of blood chemistry: Secondary | ICD-10-CM | POA: Diagnosis present

## 2013-05-30 DIAGNOSIS — Z800 Family history of malignant neoplasm of digestive organs: Secondary | ICD-10-CM

## 2013-05-30 DIAGNOSIS — J189 Pneumonia, unspecified organism: Principal | ICD-10-CM | POA: Diagnosis present

## 2013-05-30 DIAGNOSIS — E871 Hypo-osmolality and hyponatremia: Secondary | ICD-10-CM | POA: Diagnosis present

## 2013-05-30 DIAGNOSIS — Z79899 Other long term (current) drug therapy: Secondary | ICD-10-CM

## 2013-05-30 DIAGNOSIS — I6200 Nontraumatic subdural hemorrhage, unspecified: Secondary | ICD-10-CM

## 2013-05-30 DIAGNOSIS — I2699 Other pulmonary embolism without acute cor pulmonale: Secondary | ICD-10-CM

## 2013-05-30 DIAGNOSIS — D72829 Elevated white blood cell count, unspecified: Secondary | ICD-10-CM

## 2013-05-30 DIAGNOSIS — N181 Chronic kidney disease, stage 1: Secondary | ICD-10-CM | POA: Diagnosis present

## 2013-05-30 DIAGNOSIS — Z9484 Stem cells transplant status: Secondary | ICD-10-CM

## 2013-05-30 DIAGNOSIS — D649 Anemia, unspecified: Secondary | ICD-10-CM | POA: Diagnosis present

## 2013-05-30 DIAGNOSIS — I10 Essential (primary) hypertension: Secondary | ICD-10-CM

## 2013-05-30 DIAGNOSIS — R509 Fever, unspecified: Secondary | ICD-10-CM

## 2013-05-30 DIAGNOSIS — E1010 Type 1 diabetes mellitus with ketoacidosis without coma: Secondary | ICD-10-CM | POA: Diagnosis present

## 2013-05-30 DIAGNOSIS — C9102 Acute lymphoblastic leukemia, in relapse: Secondary | ICD-10-CM | POA: Diagnosis present

## 2013-05-30 DIAGNOSIS — E041 Nontoxic single thyroid nodule: Secondary | ICD-10-CM

## 2013-05-30 DIAGNOSIS — T8092XA Unspecified transfusion reaction, initial encounter: Secondary | ICD-10-CM

## 2013-05-30 DIAGNOSIS — Z86711 Personal history of pulmonary embolism: Secondary | ICD-10-CM | POA: Diagnosis present

## 2013-05-30 DIAGNOSIS — R718 Other abnormality of red blood cells: Secondary | ICD-10-CM

## 2013-05-30 DIAGNOSIS — C9101 Acute lymphoblastic leukemia, in remission: Secondary | ICD-10-CM

## 2013-05-30 DIAGNOSIS — K219 Gastro-esophageal reflux disease without esophagitis: Secondary | ICD-10-CM | POA: Diagnosis present

## 2013-05-30 DIAGNOSIS — Z5111 Encounter for antineoplastic chemotherapy: Secondary | ICD-10-CM

## 2013-05-30 LAB — BASIC METABOLIC PANEL
BUN: 11 mg/dL (ref 6–23)
CHLORIDE: 101 meq/L (ref 96–112)
CO2: 20 meq/L (ref 19–32)
Calcium: 8.9 mg/dL (ref 8.4–10.5)
Creatinine, Ser: 0.96 mg/dL (ref 0.50–1.10)
GFR calc Af Amer: 80 mL/min — ABNORMAL LOW (ref >90)
GFR calc non Af Amer: 69 mL/min — ABNORMAL LOW (ref >90)
GLUCOSE: 199 mg/dL — AB (ref 70–99)
POTASSIUM: 4.3 meq/L (ref 3.7–5.3)
Sodium: 136 mEq/L — ABNORMAL LOW (ref 137–147)

## 2013-05-30 LAB — COMPREHENSIVE METABOLIC PANEL
ALK PHOS: 370 U/L — AB (ref 39–117)
ALT: 77 U/L — AB (ref 0–35)
AST: 53 U/L — ABNORMAL HIGH (ref 0–37)
Albumin: 3.4 g/dL — ABNORMAL LOW (ref 3.5–5.2)
BUN: 14 mg/dL (ref 6–23)
CHLORIDE: 96 meq/L (ref 96–112)
CO2: 18 mEq/L — ABNORMAL LOW (ref 19–32)
Calcium: 9.6 mg/dL (ref 8.4–10.5)
Creatinine, Ser: 1.2 mg/dL — ABNORMAL HIGH (ref 0.50–1.10)
GFR, EST AFRICAN AMERICAN: 61 mL/min — AB (ref >90)
GFR, EST NON AFRICAN AMERICAN: 53 mL/min — AB (ref >90)
GLUCOSE: 413 mg/dL — AB (ref 70–99)
POTASSIUM: 4.4 meq/L (ref 3.7–5.3)
Sodium: 134 mEq/L — ABNORMAL LOW (ref 137–147)
Total Bilirubin: 0.3 mg/dL (ref 0.3–1.2)
Total Protein: 7.2 g/dL (ref 6.0–8.3)

## 2013-05-30 LAB — CBG MONITORING, ED
GLUCOSE-CAPILLARY: 360 mg/dL — AB (ref 70–99)
Glucose-Capillary: 342 mg/dL — ABNORMAL HIGH (ref 70–99)
Glucose-Capillary: 359 mg/dL — ABNORMAL HIGH (ref 70–99)

## 2013-05-30 LAB — CBC WITH DIFFERENTIAL/PLATELET
Basophils Absolute: 0 10*3/uL (ref 0.0–0.1)
Basophils Relative: 0 % (ref 0–1)
Eosinophils Absolute: 0.1 10*3/uL (ref 0.0–0.7)
Eosinophils Relative: 1 % (ref 0–5)
HCT: 29.5 % — ABNORMAL LOW (ref 36.0–46.0)
Hemoglobin: 10.2 g/dL — ABNORMAL LOW (ref 12.0–15.0)
LYMPHS ABS: 0.9 10*3/uL (ref 0.7–4.0)
LYMPHS PCT: 7 % — AB (ref 12–46)
MCH: 33.1 pg (ref 26.0–34.0)
MCHC: 34.6 g/dL (ref 30.0–36.0)
MCV: 95.8 fL (ref 78.0–100.0)
Monocytes Absolute: 1.3 10*3/uL — ABNORMAL HIGH (ref 0.1–1.0)
Monocytes Relative: 11 % (ref 3–12)
NEUTROS ABS: 10.2 10*3/uL — AB (ref 1.7–7.7)
Neutrophils Relative %: 81 % — ABNORMAL HIGH (ref 43–77)
Platelets: 262 10*3/uL (ref 150–400)
RBC: 3.08 MIL/uL — AB (ref 3.87–5.11)
RDW: 12.8 % (ref 11.5–15.5)
WBC: 12.6 10*3/uL — AB (ref 4.0–10.5)

## 2013-05-30 LAB — URINALYSIS, ROUTINE W REFLEX MICROSCOPIC
BILIRUBIN URINE: NEGATIVE
Glucose, UA: >1000 mg/dL — AB
Hgb urine dipstick: NEGATIVE
KETONES UR: 40 mg/dL — AB
Leukocytes, UA: NEGATIVE
Nitrite: NEGATIVE
PH: 5.5 (ref 5.0–8.0)
Protein, ur: NEGATIVE mg/dL
Specific Gravity, Urine: 1.022 (ref 1.005–1.030)
UROBILINOGEN UA: 0.2 mg/dL (ref 0.0–1.0)

## 2013-05-30 LAB — URINE MICROSCOPIC-ADD ON

## 2013-05-30 LAB — GLUCOSE, CAPILLARY
GLUCOSE-CAPILLARY: 317 mg/dL — AB (ref 70–99)
GLUCOSE-CAPILLARY: 171 mg/dL — AB (ref 70–99)
Glucose-Capillary: 229 mg/dL — ABNORMAL HIGH (ref 70–99)
Glucose-Capillary: 132 mg/dL — ABNORMAL HIGH (ref 70–99)
Glucose-Capillary: 133 mg/dL — ABNORMAL HIGH (ref 70–99)

## 2013-05-30 LAB — HEMOGLOBIN A1C
Hgb A1c MFr Bld: 7.4 % — ABNORMAL HIGH (ref <5.7)
MEAN PLASMA GLUCOSE: 166 mg/dL — AB (ref <117)

## 2013-05-30 LAB — MAGNESIUM: Magnesium: 1.9 mg/dL (ref 1.5–2.5)

## 2013-05-30 MED ORDER — ONDANSETRON HCL 4 MG PO TABS: 4.0000 mg | ORAL_TABLET | Freq: Four times a day (QID) | ORAL | Status: DC

## 2013-05-30 MED ORDER — SODIUM CHLORIDE 0.9 % IV SOLN
INTRAVENOUS | Status: DC
  Administered 2013-05-30: 3 [IU]/h via INTRAVENOUS
  Administered 2013-05-30: 2.2 [IU]/h via INTRAVENOUS
  Administered 2013-05-30: 3.4 [IU]/h via INTRAVENOUS
  Administered 2013-05-30: 5.1 [IU]/h via INTRAVENOUS
  Administered 2013-05-30: 0.7 [IU]/h via INTRAVENOUS
  Filled 2013-05-30: qty 1

## 2013-05-30 MED ORDER — SODIUM CHLORIDE 0.9 % IJ SOLN: 10.0000 mL | Freq: Two times a day (BID) | INTRAMUSCULAR | Status: DC

## 2013-05-30 MED ORDER — VANCOMYCIN HCL 10 G IV SOLR
2000.0000 mg | Freq: Once | INTRAVENOUS | Status: AC
  Administered 2013-05-30: 2000 mg via INTRAVENOUS
  Filled 2013-05-30: qty 2000

## 2013-05-30 MED ORDER — FOLIC ACID 1 MG PO TABS
1.0000 mg | ORAL_TABLET | Freq: Every day | ORAL | Status: DC
  Administered 2013-05-30 – 2013-06-03 (×5): 1 mg via ORAL
  Filled 2013-05-30 (×5): qty 1

## 2013-05-30 MED ORDER — SODIUM CHLORIDE 0.9 % IJ SOLN: 10.0000 mL | INTRAMUSCULAR | Status: DC | PRN

## 2013-05-30 MED ORDER — SODIUM CHLORIDE 0.9 % IJ SOLN
10.0000 mL | INTRAMUSCULAR | Status: DC | PRN
  Administered 2013-06-01 – 2013-06-02 (×4): 10 mL
  Administered 2013-06-03: 20 mL

## 2013-05-30 MED ORDER — FLUCONAZOLE 200 MG PO TABS
400.0000 mg | ORAL_TABLET | Freq: Every day | ORAL | Status: DC
  Administered 2013-05-31 – 2013-06-03 (×4): 400 mg via ORAL
  Filled 2013-05-30 (×5): qty 2

## 2013-05-30 MED ORDER — SODIUM CHLORIDE 0.9 % IV SOLN
1000.0000 mL | INTRAVENOUS | Status: DC
  Administered 2013-05-30 – 2013-05-31 (×2): 1000 mL via INTRAVENOUS

## 2013-05-30 MED ORDER — PONATINIB HCL 15 MG PO TABS
15.0000 mg | ORAL_TABLET | Freq: Every day | ORAL | Status: DC
  Administered 2013-05-31 – 2013-06-03 (×4): 15 mg via ORAL
  Filled 2013-05-30: qty 1

## 2013-05-30 MED ORDER — VANCOMYCIN HCL IN DEXTROSE 1-5 GM/200ML-% IV SOLN
1000.0000 mg | Freq: Two times a day (BID) | INTRAVENOUS | Status: DC
  Administered 2013-05-31 – 2013-06-03 (×7): 1000 mg via INTRAVENOUS
  Filled 2013-05-30 (×8): qty 200

## 2013-05-30 MED ORDER — SODIUM CHLORIDE 0.9 % IV SOLN: INTRAVENOUS | Status: DC

## 2013-05-30 MED ORDER — SULFAMETHOXAZOLE-TMP DS 800-160 MG PO TABS
1.0000 | ORAL_TABLET | ORAL | Status: DC
  Administered 2013-06-02: 1 via ORAL
  Filled 2013-05-30 (×2): qty 1

## 2013-05-30 MED ORDER — INSULIN ASPART 100 UNIT/ML ~~LOC~~ SOLN
0.0000 [IU] | SUBCUTANEOUS | Status: DC
  Administered 2013-05-30: 2 [IU] via SUBCUTANEOUS
  Administered 2013-05-31 (×2): 3 [IU] via SUBCUTANEOUS
  Administered 2013-05-31: 5 [IU] via SUBCUTANEOUS
  Administered 2013-05-31: 3 [IU] via SUBCUTANEOUS
  Administered 2013-05-31: 5 [IU] via SUBCUTANEOUS
  Administered 2013-05-31: 3 [IU] via SUBCUTANEOUS
  Administered 2013-06-01: 2 [IU] via SUBCUTANEOUS
  Administered 2013-06-01 (×2): 3 [IU] via SUBCUTANEOUS
  Administered 2013-06-01: 5 [IU] via SUBCUTANEOUS
  Administered 2013-06-01: 2 [IU] via SUBCUTANEOUS
  Administered 2013-06-01 – 2013-06-02 (×3): 3 [IU] via SUBCUTANEOUS

## 2013-05-30 MED ORDER — TRAMADOL HCL 50 MG PO TABS
50.0000 mg | ORAL_TABLET | Freq: Four times a day (QID) | ORAL | Status: DC | PRN
  Administered 2013-06-01 – 2013-06-02 (×2): 50 mg via ORAL
  Filled 2013-05-30 (×2): qty 1

## 2013-05-30 MED ORDER — PIPERACILLIN-TAZOBACTAM 3.375 G IVPB
3.3750 g | Freq: Three times a day (TID) | INTRAVENOUS | Status: DC
Start: 2013-05-30 — End: 2013-06-03
  Administered 2013-05-30 – 2013-06-03 (×12): 3.375 g via INTRAVENOUS
  Filled 2013-05-30 (×14): qty 50

## 2013-05-30 MED ORDER — GUAIFENESIN-DM 100-10 MG/5ML PO SYRP
5.0000 mL | ORAL_SOLUTION | ORAL | Status: DC | PRN
  Administered 2013-05-31 – 2013-06-01 (×8): 5 mL via ORAL
  Filled 2013-05-30 (×8): qty 10

## 2013-05-30 MED ORDER — ONDANSETRON HCL 4 MG PO TABS
4.0000 mg | ORAL_TABLET | Freq: Four times a day (QID) | ORAL | Status: DC | PRN
  Administered 2013-05-30 – 2013-05-31 (×2): 4 mg via ORAL
  Filled 2013-05-30 (×3): qty 1

## 2013-05-30 MED ORDER — INSULIN GLARGINE 100 UNIT/ML ~~LOC~~ SOLN
8.0000 [IU] | Freq: Every day | SUBCUTANEOUS | Status: DC
  Administered 2013-05-30 – 2013-05-31 (×2): 8 [IU] via SUBCUTANEOUS
  Filled 2013-05-30 (×2): qty 0.08

## 2013-05-30 MED ORDER — PROCHLORPERAZINE MALEATE 5 MG PO TABS
5.0000 mg | ORAL_TABLET | Freq: Four times a day (QID) | ORAL | Status: DC | PRN
  Filled 2013-05-30: qty 1

## 2013-05-30 MED ORDER — ENOXAPARIN SODIUM 40 MG/0.4ML ~~LOC~~ SOLN
40.0000 mg | SUBCUTANEOUS | Status: DC
  Administered 2013-05-30 – 2013-06-02 (×4): 40 mg via SUBCUTANEOUS
  Filled 2013-05-30 (×5): qty 0.4

## 2013-05-30 MED ORDER — ACYCLOVIR 800 MG PO TABS
800.0000 mg | ORAL_TABLET | Freq: Two times a day (BID) | ORAL | Status: DC
  Administered 2013-05-30 – 2013-06-03 (×8): 800 mg via ORAL
  Filled 2013-05-30 (×9): qty 1

## 2013-05-30 MED ORDER — TACROLIMUS 1 MG PO CAPS
2.0000 mg | ORAL_CAPSULE | Freq: Two times a day (BID) | ORAL | Status: DC
  Administered 2013-05-30 – 2013-06-03 (×9): 2 mg via ORAL
  Filled 2013-05-30 (×11): qty 2

## 2013-05-30 MED ORDER — DEXTROSE-NACL 5-0.45 % IV SOLN
INTRAVENOUS | Status: DC
  Administered 2013-05-30 – 2013-05-31 (×2): via INTRAVENOUS

## 2013-05-30 MED ORDER — SODIUM CHLORIDE 0.9 % IV BOLUS (SEPSIS)
1000.0000 mL | Freq: Once | INTRAVENOUS | Status: AC
  Administered 2013-05-30: 1000 mL via INTRAVENOUS

## 2013-05-30 MED ORDER — AMITRIPTYLINE HCL 10 MG PO TABS
10.0000 mg | ORAL_TABLET | Freq: Every day | ORAL | Status: DC
  Administered 2013-05-30 – 2013-06-02 (×4): 10 mg via ORAL
  Filled 2013-05-30 (×5): qty 1

## 2013-05-30 MED ORDER — BENZONATATE 100 MG PO CAPS
200.0000 mg | ORAL_CAPSULE | Freq: Three times a day (TID) | ORAL | Status: DC | PRN
  Administered 2013-05-31 – 2013-06-02 (×6): 200 mg via ORAL
  Filled 2013-05-30 (×6): qty 2

## 2013-05-30 NOTE — ED Notes (Addendum)
Pt reports to ed with c/o SOBx 1 week, as well as nonproductive cough and high blood sugars. Pt has leukemia and was diagnosed with pna on Tuesday at [facility] where her cancer doctor is. Pt denies pain, fever. CBG in triage 360. Pt is currently taking Levaquin 750mg  for PNA.

## 2013-05-30 NOTE — H&P (Signed)
[facility] History and Physical  Lynn [name] UEA:[id number] DOB: 04-10-1965 DOA: 05/30/2013  Referring physician: EDP PCP: Donnie Coffin, MD   Chief Complaint: came for worsening sob and cough and general malaise.   HPI: Lynn [name] is a 48 y.o. female with prior h/o ALL in remission, newly diagnosed DM, hypertension PE, has been feeling sob and not well over the last one week. She was seen at [facility] and was diagnosed with CAP, was sent home on levaquin. She reports she hasn't gotten better after 3 days of levaquin and came to our ED. She was found to be in mild DKA and hyperglycemic and was referred to medical service for admission. She denies any CHEST PAIN, palpitations and orthopnea. She denies vomiting, slightly nauseated . No abdominal pain.  Reviewed the CT, it was done without contrast showed ground glass opacities and early pneumonia.     Review of Systems:  Constitutional:  No weight loss, night sweats, Fevers, chills, fatigue.  HEENT:  No headaches, Difficulty swallowing,Tooth/dental problems,Sore throat,  No sneezing, itching, ear ache, nasal congestion, post nasal drip,  Cardio-vascular:  No chest pain, Orthopnea, PND, swelling in lower extremities, anasarca, dizziness, palpitations  GI:  No heartburn, indigestion, abdominal pain, nausea, vomiting, diarrhea, change in bowel habits, loss of appetite  Resp:  Worsening sob with cough, .  Skin:  no rash or lesions.  GU:  no dysuria, change in color of urine, no urgency or frequency. No flank pain.  Musculoskeletal:  No joint pain or swelling. No decreased range of motion. No back pain.  Psych:  No change in mood or affect. No depression or anxiety. No memory loss.   Past Medical History  Diagnosis Date  . Hypertension   . Brain bleed     07/04/11  . Pulmonary embolism     06/22/11  . Headache(784.0)   . Arthritis   . Anemia   . ALL (acute lymphoblastic leukemia)   . Leukemia    Past Surgical  History  Procedure Laterality Date  . Cesarean section    . Foot surgery    . Foot surgery    . Cesarean section    . Eye surgery    . Craniotomy    . Greenfield filter     Social History:  reports that she has never smoked. She has never used smokeless tobacco. She reports that she does not drink alcohol or use illicit drugs.  Allergies  Allergen Reactions  . Other Rash    Please do not use clear tegaderm dressings as patient states they pull her skin off.  Mepilex is fine.    Family History  Problem Relation Age of Onset  . Pancreatic cancer Father   . Cancer Mother     colon  . Pancreatic cancer Mother      Prior to Admission medications   Medication Sig Start Date End Date Taking? Authorizing Provider  acyclovir (ZOVIRAX) 800 MG tablet 800 mg. Take 1 tablet (800 mg total) by mouth 2 times daily. 06/03/12  Yes Historical Provider, MD  amitriptyline (ELAVIL) 10 MG tablet Take 1 tablet (10 mg total) by mouth at bedtime. 12/13/12  Yes Philmore Pali, NP  amLODipine (NORVASC) 5 MG tablet Take 10 mg by mouth daily.  04/01/13 04/01/14 Yes Historical Provider, MD  Blood Glucose Monitoring Suppl (RA BLOOD GLUCOSE MONITOR) DEVI Check blood glucose twice daily. 04/18/13  Yes Historical Provider, MD  fluconazole (DIFLUCAN) 200 MG tablet Take 400 mg by mouth daily.  02/24/13  Yes Historical Provider, MD  folic acid (FOLVITE) 1 MG tablet Take 1 mg by mouth daily.  03/13/13 03/13/14 Yes Historical Provider, MD  levofloxacin (LEVAQUIN) 750 MG tablet Take 750 mg by mouth daily. For 14 days started 05/04/26 05/27/13 06/10/13 Yes Historical Provider, MD  magnesium chloride (SLOW-MAG) 64 MG TBEC SR tablet Take 2 tablets by mouth 3 (three) times daily.  11/29/12  Yes Historical Provider, MD  metFORMIN (GLUCOPHAGE) 500 MG tablet Take 500 mg by mouth daily with breakfast. 05/27/13  Yes Historical Provider, MD  Multiple Vitamin (MULTIVITAMIN) tablet Take 1 tablet by mouth daily.   Yes Historical Provider, MD    ponatinib HCl (ICLUSIG) 15 MG tablet Take 15 mg by mouth daily. 05/06/13  Yes Historical Provider, MD  prochlorperazine (COMPAZINE) 5 MG tablet Take 5 mg by mouth every 6 (six) hours as needed (nausea).  11/29/12  Yes Historical Provider, MD  promethazine (PHENERGAN) 12.5 MG tablet Take 12.5 mg by mouth every 6 (six) hours as needed for nausea.   Yes Historical Provider, MD  ranitidine (ZANTAC) 300 MG tablet Take 300 mg by mouth at bedtime.  03/13/13 03/13/14 Yes Historical Provider, MD  sulfamethoxazole-trimethoprim (BACTRIM DS) 800-160 MG per tablet Take 1 tablet by mouth 3 (three) times a week. Takes 1 tablet Monday-Wednesday-Friday 03/14/13  Yes Historical Provider, MD  tacrolimus (PROGRAF) 1 MG capsule Take 2 mg by mouth 2 (two) times daily.  03/04/13 03/04/14 Yes Historical Provider, MD  traMADol (ULTRAM) 50 MG tablet Take 50 mg by mouth every 6 (six) hours as needed.  03/04/13  Yes Historical Provider, MD  lidocaine-prilocaine (EMLA) cream Apply 1 application topically as needed (apply to port).     Historical Provider, MD   Physical Exam: Filed Vitals:   05/30/13 1433  BP: 109/67  Pulse: 106  Temp: 98.2 F (36.8 C)  Resp: 18    BP 109/67  Pulse 106  Temp(Src) 98.2 F (36.8 C) (Oral)  Resp 18  Ht 5\' 6"  (1.676 m)  Wt 106.5 kg (234 lb 12.6 oz)  BMI 37.91 kg/m2  SpO2 100%  General:  Appears calm and comfortable Eyes: PERRL, normal lids, irises & conjunctiva ENT: grossly normal hearing, lips & tongue Neck: no LAD, masses or thyromegaly Cardiovascular: RRR, no m/r/g. No LE edema. Respiratory: CTA bilaterally, no w/r/r. Normal respiratory effort. Abdomen: soft, ntnd Skin: no rash or induration seen on limited exam Musculoskeletal:  Trace edema.  Psychiatric: grossly normal mood and affect, speech fluent and appropriate Neurologic: grossly non-focal.          Labs on Admission:  Basic Metabolic Panel:  Recent Labs Lab 05/28/13 0927 05/28/13 0928 05/30/13 1000  NA  --  137  134*  K  --  4.3 4.4  CL  --   --  96  CO2  --  19* 18*  GLUCOSE  --  290* 413*  BUN  --  13.2 14  CREATININE  --  1.4* 1.20*  CALCIUM  --  9.8 9.6  MG 1.8  --  1.9   Liver Function Tests:  Recent Labs Lab 05/28/13 0928 05/30/13 1000  AST 55* 53*  ALT 81* 77*  ALKPHOS 369* 370*  BILITOT 0.34 0.3  PROT 7.0 7.2  ALBUMIN 3.2* 3.4*   No results found for this basename: LIPASE, AMYLASE,  in the last 168 hours No results found for this basename: AMMONIA,  in the last 168 hours CBC:  Recent Labs Lab 05/28/13 0927 05/30/13 1000  WBC 12.8* 12.6*  NEUTROABS 10.1* 10.2*  HGB 10.5* 10.2*  HCT 31.1* 29.5*  MCV 100.0 95.8  PLT 287 262   Cardiac Enzymes: No results found for this basename: CKTOTAL, CKMB, CKMBINDEX, TROPONINI,  in the last 168 hours  BNP (last 3 results) No results found for this basename: PROBNP,  in the last 8760 hours CBG:  Recent Labs Lab 05/30/13 1335 05/30/13 1439 05/30/13 1547 05/30/13 1708 05/30/13 1804  GLUCAP 317* 229* 171* 132* 133*    Radiological Exams on Admission: Dg Chest 2 View  05/30/2013   CLINICAL DATA:  Shortness of breath, cough, diagnosed with pneumonia, history leukemia, hypertension, pulmonary embolism  EXAM: CHEST  2 VIEW  COMPARISON:  12/03/2012  FINDINGS: RIGHT jugular Port-A-Cath tip projecting over cavoatrial junction.  Normal heart size, mediastinal contours, and pulmonary vascularity.  Minimal RIGHT basilar atelectasis.  Lungs otherwise clear.  No pleural effusion or pneumothorax.  Bones unremarkable.  IMPRESSION: Mild RIGHT basilar atelectasis.   Electronically Signed   By: Lavonia Dana [name]   On: 05/30/2013 10:53    EKG: sinus tachy  Assessment/Plan Active Problems:   Thrombocytopenia   Leukemia, acute lymphoid   Essential hypertension   History of peripheral stem cell transplant   History of pulmonary embolism   DKA (diabetic ketoacidoses)   CAP: - admit to telemetry. And in view of her immunosuppressed status,  we will continue with vancomycin and zosyn .  - sputum cultures ordered.  - urine for strepto and legionella antigen.    DKA: - started on IV gluco stabilizer.  - her cbg's are much better , later transitioned to sq lantus and SSI.  - HGBA1C ordered.   Hypertension - controlled.   ALL: - resume home medications.   Mild hyponatremia: - repeat in am.  - hydration.   CKD stage 1.  - gentle hydration and repeat renal parameters in am.   Elevated liver function tests: - no abd pain.  - mild elevation.  - continue to monitor.   Leukocytosis: Probably from CAP  ANEMIA: - apperas to be at baseline.   Thrombocytopenia: - resolved.      Code Status: full code Family Communication: none at bedside Disposition Plan: pending.   Time spent: 75 min  [facility] Pager [phone]

## 2013-05-30 NOTE — Care Management Note (Signed)
    Page 1 of 1   05/30/2013     3:02:29 PM CARE MANAGEMENT NOTE 05/30/2013  Patient:  Lynn [name], Lynn [name]   Account Number:  [id number]  Date Initiated:  05/30/2013  Documentation initiated by:  Dessa Phi  Subjective/Objective Assessment:   48 Y/O F ADMITTED W/SOB.PNA.     Action/Plan:   FROM HOME.   Anticipated DC Date:  06/02/2013   Anticipated DC Plan:  [place]  CM consult      Choice offered to / List presented to:             Status of service:  In process, will continue to follow Medicare Important Message given?   (If response is "NO", the following Medicare IM given date fields will be blank) Date Medicare IM given:   Date Additional Medicare IM given:    Discharge Disposition:    Per UR Regulation:  Reviewed for med. necessity/level of care/duration of stay  If discussed at [place] of Stay Meetings, dates discussed:    Comments:  05/30/13 [facility] RN,BSN NCM [phone]

## 2013-05-30 NOTE — ED Provider Notes (Signed)
CSN: [id number]     Arrival date & time 05/30/13  0904 History   First MD Initiated Contact with Patient 05/30/13 0915     Chief Complaint  Patient presents with  . Shortness of Breath  . Cough  . Hyperglycemia     (Consider location/radiation/quality/duration/timing/severity/associated sxs/prior Treatment) Patient is a 48 y.o. female presenting with shortness of breath, cough, and hyperglycemia.  Shortness of Breath Associated symptoms: cough   Associated symptoms: no abdominal pain, no chest pain, no headaches, no rash and no vomiting   Cough Associated symptoms: shortness of breath   Associated symptoms: no chest pain, no headaches and no rash   Hyperglycemia Associated symptoms: shortness of breath   Associated symptoms: no abdominal pain, no chest pain, no nausea and no vomiting    patient has a history of ALL and is post stem cell transplant. She's had cough and shortness of breath over the last several days. She's had some yellow sputum production. She has had temperatures up to 99 5. She was seen at her oncologist office at [place] and had a CT scan that showed pneumonia and she was started on Levaquin. She states she continues to feel short of breath. She continues to have the cough. She chest pain. No swelling or legs.states she's had increasing fatigue.  No hemoptysis   Past Medical History  Diagnosis Date  . Hypertension   . Brain bleed     07/04/11  . Pulmonary embolism     06/22/11  . Headache(784.0)   . Arthritis   . Anemia   . ALL (acute lymphoblastic leukemia)   . Leukemia    Past Surgical History  Procedure Laterality Date  . Cesarean section    . Foot surgery    . Foot surgery    . Cesarean section    . Eye surgery    . Craniotomy    . Greenfield filter     Family History  Problem Relation Age of Onset  . Pancreatic cancer Father   . Cancer Mother     colon  . Pancreatic cancer Mother    History  Substance Use Topics  . Smoking status:  Never Smoker   . Smokeless tobacco: Never Used  . Alcohol Use: No   OB History   Grav Para Term Preterm Abortions TAB SAB Ect Mult Living                 Review of Systems  Constitutional: Positive for appetite change. Negative for activity change.  Eyes: Negative for pain.  Respiratory: Positive for cough and shortness of breath. Negative for chest tightness.   Cardiovascular: Negative for chest pain and leg swelling.  Gastrointestinal: Negative for nausea, vomiting, abdominal pain and diarrhea.  Genitourinary: Negative for flank pain.  Musculoskeletal: Negative for back pain and neck stiffness.  Skin: Negative for rash.  Neurological: Negative for weakness, numbness and headaches.  Psychiatric/Behavioral: Negative for behavioral problems.      Allergies  Other  Home Medications   Prior to Admission medications   Medication Sig Start Date End Date Taking? Authorizing Provider  acyclovir (ZOVIRAX) 800 MG tablet 800 mg. Take 1 tablet (800 mg total) by mouth 2 times daily. 06/03/12   Historical Provider, MD  amitriptyline (ELAVIL) 10 MG tablet Take 1 tablet (10 mg total) by mouth at bedtime. 12/13/12   Philmore Pali, NP  amLODipine (NORVASC) 5 MG tablet Take 10 mg by mouth. 04/01/13 04/01/14  Historical Provider, MD  Blood  Glucose Monitoring Suppl (RA BLOOD GLUCOSE MONITOR) DEVI Check blood glucose twice daily. 04/18/13   Historical Provider, MD  fluconazole (DIFLUCAN) 200 MG tablet Take 400 mg by mouth. 02/24/13   Historical Provider, MD  folic acid (FOLVITE) 1 MG tablet Take 1 mg by mouth daily.  03/13/13 03/13/14  Historical Provider, MD  levofloxacin (LEVAQUIN) 750 MG tablet Take 750 mg by mouth daily. For 14 days started 05/04/26 05/27/13 06/10/13  Historical Provider, MD  lidocaine-prilocaine (EMLA) cream Apply 1 application topically as needed (apply to port).     Historical Provider, MD  magnesium chloride (SLOW-MAG) 64 MG TBEC SR tablet Take 2 tablets by mouth 3 (three) times daily.   11/29/12   Historical Provider, MD  metFORMIN (GLUCOPHAGE) 500 MG tablet Take 500 mg by mouth daily with breakfast. 05/27/13   Historical Provider, MD  Multiple Vitamin (MULTIVITAMIN) tablet Take 1 tablet by mouth daily.    Historical Provider, MD  ponatinib HCl (ICLUSIG) 15 MG tablet Take 15 mg by mouth daily. 05/06/13   Historical Provider, MD  prochlorperazine (COMPAZINE) 5 MG tablet Take 5 mg by mouth every 6 (six) hours as needed (nausea).  11/29/12   Historical Provider, MD  promethazine (PHENERGAN) 12.5 MG tablet Take 12.5 mg by mouth every 6 (six) hours as needed for nausea.    Historical Provider, MD  ranitidine (ZANTAC) 300 MG tablet Take 300 mg by mouth. 03/13/13 03/13/14  Historical Provider, MD  Specialty Vitamins Products (MAGNESIUM, AMINO ACID CHELATE,) 133 MG tablet Take 2 tablets by mouth. 02/24/13   Historical Provider, MD  sulfamethoxazole-trimethoprim (BACTRIM DS) 800-160 MG per tablet Take 1 tablet by mouth. 03/14/13   Historical Provider, MD  SUMAtriptan (IMITREX) 100 MG tablet Take 100 mg by mouth. 12/30/12   Historical Provider, MD  tacrolimus (PROGRAF) 1 MG capsule Take 3 mg by mouth. 03/04/13 03/04/14  Historical Provider, MD  traMADol (ULTRAM) 50 MG tablet Take 50 mg by mouth every 6 (six) hours as needed.  03/04/13   Historical Provider, MD   BP 109/67  Pulse 106  Temp(Src) 98.2 F (36.8 C) (Oral)  Resp 18  Ht 5\' 6"  (3.903 m)  Wt 234 lb 12.6 oz (106.5 kg)  BMI 37.91 kg/m2  SpO2 100% Physical Exam  Nursing note and vitals reviewed. Constitutional: She is oriented to person, place, and time. She appears well-developed and well-nourished.  HENT:  Head: Normocephalic and atraumatic.  Eyes: EOM are normal. Pupils are equal, round, and reactive to light.  Neck: Normal range of motion. Neck supple.  Cardiovascular: Regular rhythm and normal heart sounds.   No murmur heard. Tachycardia  Pulmonary/Chest: Effort normal. No respiratory distress. She has wheezes. She has no rales.   Few scattered wheezes on left. Port-A-Cath to right chest wall  Abdominal: Soft. Bowel sounds are normal. She exhibits no distension. There is no tenderness. There is no rebound and no guarding.  Musculoskeletal: Normal range of motion. She exhibits edema.  Mild bilateral lower extremity pitting edema  Neurological: She is alert and oriented to person, place, and time. No cranial nerve deficit.  Skin: Skin is warm and dry.  Psychiatric: She has a normal mood and affect. Her speech is normal.    ED Course  Procedures (including critical care time) Labs Review Labs Reviewed  CBC WITH DIFFERENTIAL - Abnormal; Notable for the following:    WBC 12.6 (*)    RBC 3.08 (*)    Hemoglobin 10.2 (*)    HCT 29.5 (*)  Neutrophils Relative % 81 (*)    Neutro Abs 10.2 (*)    Lymphocytes Relative 7 (*)    Monocytes Absolute 1.3 (*)    All other components within normal limits  COMPREHENSIVE METABOLIC PANEL - Abnormal; Notable for the following:    Sodium 134 (*)    CO2 18 (*)    Glucose, Bld 413 (*)    Creatinine, Ser 1.20 (*)    Albumin 3.4 (*)    AST 53 (*)    ALT 77 (*)    Alkaline Phosphatase 370 (*)    GFR calc non Af Amer 53 (*)    GFR calc Af Amer 61 (*)    All other components within normal limits  URINALYSIS, ROUTINE W REFLEX MICROSCOPIC - Abnormal; Notable for the following:    Glucose, UA >1000 (*)    Ketones, ur 40 (*)    All other components within normal limits  URINE MICROSCOPIC-ADD ON - Abnormal; Notable for the following:    Squamous Epithelial / LPF FEW (*)    All other components within normal limits  GLUCOSE, CAPILLARY - Abnormal; Notable for the following:    Glucose-Capillary 317 (*)    All other components within normal limits  GLUCOSE, CAPILLARY - Abnormal; Notable for the following:    Glucose-Capillary 229 (*)    All other components within normal limits  GLUCOSE, CAPILLARY - Abnormal; Notable for the following:    Glucose-Capillary 171 (*)    All other  components within normal limits  CBG MONITORING, ED - Abnormal; Notable for the following:    Glucose-Capillary 360 (*)    All other components within normal limits  CBG MONITORING, ED - Abnormal; Notable for the following:    Glucose-Capillary 342 (*)    All other components within normal limits  CBG MONITORING, ED - Abnormal; Notable for the following:    Glucose-Capillary 359 (*)    All other components within normal limits  MAGNESIUM  HEMOGLOBIN A1C    Imaging Review Dg Chest 2 View  05/30/2013   CLINICAL DATA:  Shortness of breath, cough, diagnosed with pneumonia, history leukemia, hypertension, pulmonary embolism  EXAM: CHEST  2 VIEW  COMPARISON:  12/03/2012  FINDINGS: RIGHT jugular Port-A-Cath tip projecting over cavoatrial junction.  Normal heart size, mediastinal contours, and pulmonary vascularity.  Minimal RIGHT basilar atelectasis.  Lungs otherwise clear.  No pleural effusion or pneumothorax.  Bones unremarkable.  IMPRESSION: Mild RIGHT basilar atelectasis.   Electronically Signed   By: Lavonia Dana [name]   On: 05/30/2013 10:53     EKG Interpretation None      Date: 05/30/2013  Rate:118  Rhythm: sinus tachycardia  QRS Axis: normal  Intervals: normal  ST/T Wave abnormalities: normal  Conduction Disutrbances:none  Narrative Interpretation:   Old EKG Reviewed: unchanged   MDM   Final diagnoses:  DKA (diabetic ketoacidoses)  CAP (community acquired pneumonia)    Patient with ALL in remission, but on some maintenance chemotherapy. Recent pneumonia on outside CT scan, however the results are able to be visualized through care everywhere. Has been on Levaquin her sugars have been elevated. She appears to be a mild DKA. Will be admitted to internal medicine.Jasper Riling. Alvino Chapel, MD 05/30/13 1620

## 2013-05-30 NOTE — Progress Notes (Signed)
Gluco stabilizer programmed a rate of "0units/hr" after entering CBG. Confirmed with MD to stop insulin drip rate. Received orders to stop and give 8 units of Lantus and check CBG Q4 with sliding scale. Will continue to monitor patient. J.

## 2013-05-30 NOTE — Progress Notes (Signed)
ANTIBIOTIC CONSULT NOTE - INITIAL  Pharmacy Consult for Vancomycin and Zosyn Indication: PNA in immunocompromised patient  Allergies  Allergen Reactions  . Other Rash    Please do not use clear tegaderm dressings as patient states they pull her skin off.  Mepilex is fine.    Patient Measurements: Height: 5\' 6"  (167.6 cm) Weight: 234 lb 12.6 oz (106.5 kg) IBW/kg (Calculated) : 59.3  Vital Signs: Temp: 98.2 F (36.8 C) (05/01 1433) Temp src: Oral (05/01 1433) BP: 109/67 mmHg (05/01 1433) Pulse Rate: 106 (05/01 1433) Intake/Output from previous day:   Intake/Output from this shift:    Labs:  Recent Labs  05/28/13 0927 05/28/13 0928 05/30/13 1000  WBC 12.8*  --  12.6*  HGB 10.5*  --  10.2*  PLT 287  --  262  CREATININE  --  1.4* 1.20*   Estimated Creatinine Clearance: 70.8 ml/min (by C-G formula based on Cr of 1.2). 62 ml/min/1.43m2 (normalized) No results found for this basename: VANCOTROUGH, VANCOPEAK, VANCORANDOM, GENTTROUGH, GENTPEAK, GENTRANDOM, TOBRATROUGH, TOBRAPEAK, TOBRARND, AMIKACINPEAK, AMIKACINTROU, AMIKACIN,  in the last 72 hours   Microbiology: No results found for this or any previous visit (from the past 720 hour(s)).  Medical History: Past Medical History  Diagnosis Date  . Hypertension   . Brain bleed     07/04/11  . Pulmonary embolism     06/22/11  . Headache(784.0)   . Arthritis   . Anemia   . ALL (acute lymphoblastic leukemia)   . Leukemia     Medications:  Scheduled:  . acyclovir  800 mg Oral BID  . amitriptyline  10 mg Oral QHS  . enoxaparin (LOVENOX) injection  40 mg Subcutaneous Q24H  . [START ON 05/31/2013] fluconazole  400 mg Oral Daily  . folic acid  1 mg Oral Daily  . piperacillin-tazobactam (ZOSYN)  IV  3.375 g Intravenous Q8H  . [START ON 05/31/2013] ponatinib HCl  15 mg Oral Daily  . [START ON 05/31/2013] sulfamethoxazole-trimethoprim  1 tablet Oral Once per day on Mon Wed Fri  . tacrolimus  2 mg Oral BID  . vancomycin  2,000  mg Intravenous Once  . [START ON 05/31/2013] vancomycin  1,000 mg Intravenous Q12H   Infusions:  . sodium chloride 1,000 mL (05/30/13 1220)  . dextrose 5 % and 0.45% NaCl Stopped (05/30/13 1244)  . insulin (NOVOLIN-R) infusion 5.1 Units/hr (05/30/13 1336)   PRN: benzonatate, guaiFENesin-dextromethorphan, ondansetron, prochlorperazine, traMADol  Anti-infectives: PTA Levaquin from 4/28 stopped PTA Bactrim, Acyclovir, Fluconazole continued  5/1 >> Vanc >> 5/1 >> Zosyn >>  Assessment: 48 yo female with ALL s/p allogeneic stem cell transplant on January 8th at [facility]. Started on levaquin as outpatient for PNA diagnosed by CT last week at [facility], now presents to ED with SOB and cough. Pharmacy consulted to dose Vanc/Zosyn for PNA in immunocompromised patient.   Goal of Therapy:  Vancomycin trough level 15-20 mcg/ml Zosyn dose per renal function  Plan:   Vancomycin 2g IV x 1, then 1g IV q12h Check trough at steady state Zosyn 3.375gm IV q8h (4hr extended infusions) Follow up renal function & cultures, clinical course, de-escalation of therapy  Peggyann Juba, PharmD, BCPS Pager: [phone] 05/30/2013,2:35 PM

## 2013-05-30 NOTE — ED Notes (Signed)
Initial contact-patient A&O x4. Resting comfortably. C/o feeling "jittery." CBG checked immediately- result 342. Pt in NAD. No other complaints at this time.

## 2013-05-31 ENCOUNTER — Other Ambulatory Visit: Payer: Self-pay

## 2013-05-31 DIAGNOSIS — I10 Essential (primary) hypertension: Secondary | ICD-10-CM

## 2013-05-31 DIAGNOSIS — J189 Pneumonia, unspecified organism: Principal | ICD-10-CM

## 2013-05-31 DIAGNOSIS — I2699 Other pulmonary embolism without acute cor pulmonale: Secondary | ICD-10-CM

## 2013-05-31 DIAGNOSIS — K219 Gastro-esophageal reflux disease without esophagitis: Secondary | ICD-10-CM

## 2013-05-31 DIAGNOSIS — R51 Headache: Secondary | ICD-10-CM

## 2013-05-31 DIAGNOSIS — R748 Abnormal levels of other serum enzymes: Secondary | ICD-10-CM

## 2013-05-31 DIAGNOSIS — C9102 Acute lymphoblastic leukemia, in relapse: Secondary | ICD-10-CM

## 2013-05-31 LAB — BASIC METABOLIC PANEL
BUN: 10 mg/dL (ref 6–23)
CHLORIDE: 100 meq/L (ref 96–112)
CO2: 21 mEq/L (ref 19–32)
Calcium: 8.7 mg/dL (ref 8.4–10.5)
Creatinine, Ser: 1.06 mg/dL (ref 0.50–1.10)
GFR calc Af Amer: 71 mL/min — ABNORMAL LOW (ref >90)
GFR calc non Af Amer: 61 mL/min — ABNORMAL LOW (ref >90)
GLUCOSE: 289 mg/dL — AB (ref 70–99)
Potassium: 4.1 mEq/L (ref 3.7–5.3)
SODIUM: 133 meq/L — AB (ref 137–147)

## 2013-05-31 LAB — CBC
HEMATOCRIT: 25.4 % — AB (ref 36.0–46.0)
HEMOGLOBIN: 8.9 g/dL — AB (ref 12.0–15.0)
MCH: 33.6 pg (ref 26.0–34.0)
MCHC: 35 g/dL (ref 30.0–36.0)
MCV: 95.8 fL (ref 78.0–100.0)
Platelets: 238 10*3/uL (ref 150–400)
RBC: 2.65 MIL/uL — ABNORMAL LOW (ref 3.87–5.11)
RDW: 12.9 % (ref 11.5–15.5)
WBC: 10.4 10*3/uL (ref 4.0–10.5)

## 2013-05-31 LAB — GLUCOSE, CAPILLARY
GLUCOSE-CAPILLARY: 184 mg/dL — AB (ref 70–99)
GLUCOSE-CAPILLARY: 238 mg/dL — AB (ref 70–99)
GLUCOSE-CAPILLARY: 240 mg/dL — AB (ref 70–99)
GLUCOSE-CAPILLARY: 248 mg/dL — AB (ref 70–99)
GLUCOSE-CAPILLARY: 290 mg/dL — AB (ref 70–99)
GLUCOSE-CAPILLARY: 293 mg/dL — AB (ref 70–99)
Glucose-Capillary: 241 mg/dL — ABNORMAL HIGH (ref 70–99)

## 2013-05-31 LAB — STREP PNEUMONIAE URINARY ANTIGEN: Strep Pneumo Urinary Antigen: NEGATIVE

## 2013-05-31 MED ORDER — MORPHINE SULFATE 2 MG/ML IJ SOLN
2.0000 mg | INTRAMUSCULAR | Status: DC | PRN
  Administered 2013-05-31: 2 mg via INTRAVENOUS
  Filled 2013-05-31: qty 1

## 2013-05-31 MED ORDER — INSULIN GLARGINE 100 UNIT/ML ~~LOC~~ SOLN
4.0000 [IU] | Freq: Once | SUBCUTANEOUS | Status: AC
  Administered 2013-05-31: 4 [IU] via SUBCUTANEOUS
  Filled 2013-05-31: qty 0.04

## 2013-05-31 MED ORDER — HYDROMORPHONE HCL PF 1 MG/ML IJ SOLN
1.0000 mg | INTRAMUSCULAR | Status: DC | PRN
  Administered 2013-05-31 – 2013-06-03 (×15): 1 mg via INTRAVENOUS
  Filled 2013-05-31 (×15): qty 1

## 2013-05-31 MED ORDER — SODIUM CHLORIDE 0.9 % IV SOLN: 1000.0000 mL | INTRAVENOUS | Status: AC

## 2013-05-31 MED ORDER — INSULIN GLARGINE 100 UNIT/ML ~~LOC~~ SOLN
12.0000 [IU] | Freq: Every day | SUBCUTANEOUS | Status: DC
  Administered 2013-06-01: 12 [IU] via SUBCUTANEOUS
  Filled 2013-05-31 (×2): qty 0.12

## 2013-05-31 MED ORDER — ACETAMINOPHEN 325 MG PO TABS
650.0000 mg | ORAL_TABLET | Freq: Four times a day (QID) | ORAL | Status: DC | PRN
  Administered 2013-06-01 – 2013-06-02 (×3): 650 mg via ORAL
  Filled 2013-05-31 (×3): qty 2

## 2013-05-31 NOTE — Progress Notes (Signed)
Lynn [name]   DOB:1965-12-26   WU#:[id number]   VOZ#:[id number]  Subjective: patient reported pleurisy with mild relief with morphine.  Positive cough.   Objective:  Filed Vitals:   05/31/13 0502  BP: 110/59  Pulse: 95  Temp: 98.3 F (36.8 C)  Resp: 20    Body mass index is 37.91 kg/(m^2).  Intake/Output Summary (Last 24 hours) at 05/31/13 1249 Last data filed at 05/31/13 0700  Gross per 24 hour  Intake 1108.75 ml  Output   1600 ml  Net -491.25 ml    Alopecia, chronically ill appearing  Sclerae unicteric  Oropharynx clear, Dry MMM  No peripheral adenopathy  Lungs, decreased breath sounds on R  Heart tachy, no murmurs  Abdomen benign  MSK no peripheral edema  Neuro nonfocal   Recent Labs  05/31/13 0500 05/31/13 0733 05/31/13 1204  GLUCAP 238* 240* 248*     Labs:  Lab Results  Component Value Date   WBC 10.4 05/31/2013   HGB 8.9* 05/31/2013   HCT 25.4* 05/31/2013   MCV 95.8 05/31/2013   PLT 238 05/31/2013   NEUTROABS 10.2* 05/02/5954    Basic Metabolic Panel:  Recent Labs Lab 05/28/13 0927 05/28/13 0928  05/30/13 1000 05/30/13 1926 05/31/13 0415  NA  --  137  --  134* 136* 133*  K  --  4.3  < > 4.4 4.3 4.1  CL  --   --   --  96 101 100  CO2  --  19*  --  18* 20 21  GLUCOSE  --  290*  --  413* 199* 289*  BUN  --  13.2  --  14 11 10   CREATININE  --  1.4*  --  1.20* 0.96 1.06  CALCIUM  --  9.8  --  9.6 8.9 8.7  MG 1.8  --   --  1.9  --   --   < > = values in this interval not displayed. GFR Estimated Creatinine Clearance: 80.1 ml/min (by C-G formula based on Cr of 1.06). Liver Function Tests:  Recent Labs Lab 05/28/13 0928 05/30/13 1000  AST 55* 53*  ALT 81* 77*  ALKPHOS 369* 370*  BILITOT 0.34 0.3  PROT 7.0 7.2  ALBUMIN 3.2* 3.4*   CBC:  Recent Labs Lab 05/28/13 0927 05/30/13 1000 05/31/13 0415  WBC 12.8* 12.6* 10.4  NEUTROABS 10.1* 10.2*  --   HGB 10.5* 10.2* 8.9*  HCT 31.1* 29.5* 25.4*  MCV 100.0 95.8 95.8  PLT 287 262 238    CBG:  Recent Labs Lab 05/30/13 1902 05/31/13 0019 05/31/13 0500 05/31/13 0733 05/31/13 1204  GLUCAP 184* 290* 238* 240* 248*   Hgb A1c  Recent Labs  05/30/13 1500  HGBA1C 7.4*    Studies:   CT Thorax without contrast, Apr 28.2015 1. Foci of ground glass opacity in the right upper and lower lobes, consistent with pneumonia. 2. Nodular soft tissue opacity in the right upper lobe may be infectious, but recommend attention on follow up imaging to ensure resolution.   Dg Chest 2 View  05/30/2013   CLINICAL DATA:  Shortness of breath, cough, diagnosed with pneumonia, history leukemia, hypertension, pulmonary embolism  EXAM: CHEST  2 VIEW  COMPARISON:  12/03/2012  FINDINGS: RIGHT jugular Port-A-Cath tip projecting over cavoatrial junction.  Normal heart size, mediastinal contours, and pulmonary vascularity.  Minimal RIGHT basilar atelectasis.  Lungs otherwise clear.  No pleural effusion or pneumothorax.  Bones unremarkable.  IMPRESSION: Mild RIGHT basilar atelectasis.  Electronically Signed   By: Lavonia Dana [name]   On: 05/30/2013 10:53    Assessment/Plan: 48 y.o.  1. ALL, relapsed s/p allogeneic stem cell transplant. Day 0 was 02/06/2013. Counts are stable.  --Patient has experienced relapsed disease as noted above. She had her 2nd AS[facility] at [facility]. Her counts demonstrate recovery presently.  --She will continue on prophylaxis medications including acyclovir 800 mg bid, Bactrim DS on Mon, Wed, and Friday.   2. Pneumonia, RLL as noted per CT (05/27/13).  --Continue vancomycin plus zosyn per primary. Incentive spirometry and oxygen prn.  --Intravenous normal saline. --CXR, without evidence of pneumonia.  RLL atelectasis  3. Chronic headaches plus leg tingling.  --She is following with neurology. Continue imitrex 100 mg prn.   4. GERD.  --Continue pantoprazole 40 mg daily.   5. History of pulmonary emboli involving the right upper and right lower lobes with positive CT chest  angiogram on 06/17/2011 and negative Dopplers.  --Placement of inferior vena cava Greenfield filter on 07/07/2011.   6. Development of subdural hematoma involving the posterior fossa when the patient was hospitalized at [facility], status post suboccipital craniotomy and evacuation of hematoma on 07/04/2011.   7. History of hypertension.  -- Observing off medication.   8. Elevated liver enzymes.  --We will continue to trend and avoid hepatoxins.   9. Hyperglycemia, likely DM2  --on Insulin  10. Disposition. Full Code.  We will follow Ms. Medine with you.   Concha Norway, MD 05/31/2013  12:49 PM

## 2013-05-31 NOTE — Progress Notes (Signed)
Patient complaining of right sided stabbing chest pain with inspiration early this AM. BP 108/62, pulse 114. EKG done and patient sinus tachy but otherwise normal. [facility] midlevel notified, said pain most likely related to pneumonia. Ordered 2 mg IV morphine and 2 L O2 N/C. Patient had no relief from morphine after 45 minutes, so 1 mg IV dilaudid ordered. Will continue to monitor patient.

## 2013-05-31 NOTE — Progress Notes (Signed)
[facility] PROGRESS NOTE  Lynn [name] PZW:[id number] DOB: August 22, 1965 DOA: 05/30/2013 PCP: Donnie Coffin, MD  Brief Narrative Lynn [name] is a 48 y.o. female with prior h/o ALL in remission, newly diagnosed DM, hypertension PE, who came in with shortness of breath and not feeling well over the last one week. She was seen at [facility] and was diagnosed with CAP, and was sent home on levaquin but she hadn't gotten better after 3 days of levaquin and came to our ED. She was found to be in mild DKA and hyperglycemic and was referred to medical service for admission.  CT chest without contrast showed ground glass opacities and early pneumonia. She was hence placed on vancomycin and Zosyn. Her white count has improved to 10,400 from 12,800. We'll therefore continue current metabolic takes pending cultures. Appreciate oncology. Plan  CAP:  - Continue with vancomycin and zosyn .  - Follow sputum cultures  - urine for strepto and legionella antigen.  DKA:  - Resolved  - Now on long-acting insulin and SSI - HGBA1C 7 point.  Hypertension  - controlled.  ALL:  - resume home medications.  Mild hyponatremia:  - repeat in am.  - hydration.  CKD stage 1.  - Improved with gentle hydration   Elevated liver function tests:  - no abd pain.  - mild elevation. Improving. - continue to monitor.  Leukocytosis:  Probably from CAP . Improve ANEMIA:  - apperas to be at baseline.  Thrombocytopenia:  - resolved.  Code Status: full code  Family Communication: none at bedside  Disposition Plan: pending.   HPI/Subjective: Feels better.  Objective: Filed Vitals:   05/31/13 2032  BP: 121/83  Pulse: 102  Temp: 100 F (37.8 C)  Resp: 20    Intake/Output Summary (Last 24 hours) at 05/31/13 2106 Last data filed at 05/31/13 0700  Gross per 24 hour  Intake 868.75 ml  Output   1600 ml  Net -731.25 ml   Filed Weights   05/30/13 1433  Weight: 106.5 kg (234 lb 12.6 oz)     Exam:   General:  Comfortable at rest  Cardiovascular: RRR.S1S2 normal. No murmurs.  Respiratory: Lungs clear  Abdomen: Soft and nontender. No palpable organomegaly.  Musculoskeletal: No pedal edema  Data Reviewed: Basic Metabolic Panel:  Recent Labs Lab 05/28/13 0927 05/28/13 0928 05/30/13 1000 05/30/13 1926 05/31/13 0415  NA  --  137 134* 136* 133*  K  --  4.3 4.4 4.3 4.1  CL  --   --  96 101 100  CO2  --  19* 18* 20 21  GLUCOSE  --  290* 413* 199* 289*  BUN  --  13.2 14 11 10   CREATININE  --  1.4* 1.20* 0.96 1.06  CALCIUM  --  9.8 9.6 8.9 8.7  MG 1.8  --  1.9  --   --    Liver Function Tests:  Recent Labs Lab 05/28/13 0928 05/30/13 1000  AST 55* 53*  ALT 81* 77*  ALKPHOS 369* 370*  BILITOT 0.34 0.3  PROT 7.0 7.2  ALBUMIN 3.2* 3.4*   No results found for this basename: LIPASE, AMYLASE,  in the last 168 hours No results found for this basename: AMMONIA,  in the last 168 hours CBC:  Recent Labs Lab 05/28/13 0927 05/30/13 1000 05/31/13 0415  WBC 12.8* 12.6* 10.4  NEUTROABS 10.1* 10.2*  --   HGB 10.5* 10.2* 8.9*  HCT 31.1* 29.5* 25.4*  MCV 100.0 95.8 95.8  PLT  287 262 238   Cardiac Enzymes: No results found for this basename: CKTOTAL, CKMB, CKMBINDEX, TROPONINI,  in the last 168 hours BNP (last 3 results) No results found for this basename: PROBNP,  in the last 8760 hours CBG:  Recent Labs Lab 05/31/13 0019 05/31/13 0500 05/31/13 0733 05/31/13 1204 05/31/13 1622  GLUCAP 290* 238* 240* 248* 293*    No results found for this or any previous visit (from the past 240 hour(s)).   Studies: Dg Chest 2 View  05/30/2013   CLINICAL DATA:  Shortness of breath, cough, diagnosed with pneumonia, history leukemia, hypertension, pulmonary embolism  EXAM: CHEST  2 VIEW  COMPARISON:  12/03/2012  FINDINGS: RIGHT jugular Port-A-Cath tip projecting over cavoatrial junction.  Normal heart size, mediastinal contours, and pulmonary vascularity.  Minimal  RIGHT basilar atelectasis.  Lungs otherwise clear.  No pleural effusion or pneumothorax.  Bones unremarkable.  IMPRESSION: Mild RIGHT basilar atelectasis.   Electronically Signed   By: Lavonia Dana [name]   On: 05/30/2013 10:53    Scheduled Meds: . acyclovir  800 mg Oral BID  . amitriptyline  10 mg Oral QHS  . enoxaparin (LOVENOX) injection  40 mg Subcutaneous Q24H  . fluconazole  400 mg Oral Daily  . folic acid  1 mg Oral Daily  . insulin aspart  0-9 Units Subcutaneous 6 times per day  . insulin glargine  8 Units Subcutaneous QHS  . piperacillin-tazobactam (ZOSYN)  IV  3.375 g Intravenous Q8H  . ponatinib HCl  15 mg Oral Daily  . sodium chloride  10-40 mL Intracatheter Q12H  . sodium chloride  10-40 mL Intracatheter Q12H  . sulfamethoxazole-trimethoprim  1 tablet Oral Once per day on Mon Wed Fri  . tacrolimus  2 mg Oral BID  . vancomycin  1,000 mg Intravenous Q12H   Continuous Infusions: . sodium chloride 1,000 mL (05/31/13 0746)    Active Problems:   Thrombocytopenia   Leukemia, acute lymphoid   Essential hypertension   History of peripheral stem cell transplant   History of pulmonary embolism   DKA (diabetic ketoacidoses)   CAP (community acquired pneumonia)      [facility] Pager [phone]. If 7PM-7AM, please contact night-coverage at [email or web address], password [facility] 05/31/2013, 9:06 PM  LOS: 1 day

## 2013-06-01 DIAGNOSIS — C9101 Acute lymphoblastic leukemia, in remission: Secondary | ICD-10-CM

## 2013-06-01 LAB — COMPREHENSIVE METABOLIC PANEL
ALK PHOS: 303 U/L — AB (ref 39–117)
ALT: 71 U/L — ABNORMAL HIGH (ref 0–35)
AST: 68 U/L — ABNORMAL HIGH (ref 0–37)
Albumin: 2.6 g/dL — ABNORMAL LOW (ref 3.5–5.2)
BUN: 6 mg/dL (ref 6–23)
CALCIUM: 8.5 mg/dL (ref 8.4–10.5)
CO2: 21 meq/L (ref 19–32)
Chloride: 103 mEq/L (ref 96–112)
Creatinine, Ser: 0.82 mg/dL (ref 0.50–1.10)
GFR calc Af Amer: >90 mL/min (ref >90)
GFR calc non Af Amer: 83 mL/min — ABNORMAL LOW (ref >90)
Glucose, Bld: 153 mg/dL — ABNORMAL HIGH (ref 70–99)
POTASSIUM: 3.9 meq/L (ref 3.7–5.3)
Sodium: 138 mEq/L (ref 137–147)
Total Bilirubin: 0.3 mg/dL (ref 0.3–1.2)
Total Protein: 6 g/dL (ref 6.0–8.3)

## 2013-06-01 LAB — GLUCOSE, CAPILLARY
GLUCOSE-CAPILLARY: 252 mg/dL — AB (ref 70–99)
Glucose-Capillary: 104 mg/dL — ABNORMAL HIGH (ref 70–99)
Glucose-Capillary: 155 mg/dL — ABNORMAL HIGH (ref 70–99)
Glucose-Capillary: 180 mg/dL — ABNORMAL HIGH (ref 70–99)
Glucose-Capillary: 216 mg/dL — ABNORMAL HIGH (ref 70–99)
Glucose-Capillary: 241 mg/dL — ABNORMAL HIGH (ref 70–99)
Glucose-Capillary: 247 mg/dL — ABNORMAL HIGH (ref 70–99)

## 2013-06-01 LAB — CBC
HCT: 24.8 % — ABNORMAL LOW (ref 36.0–46.0)
Hemoglobin: 8.6 g/dL — ABNORMAL LOW (ref 12.0–15.0)
MCH: 33.2 pg (ref 26.0–34.0)
MCHC: 34.7 g/dL (ref 30.0–36.0)
MCV: 95.8 fL (ref 78.0–100.0)
PLATELETS: 228 10*3/uL (ref 150–400)
RBC: 2.59 MIL/uL — AB (ref 3.87–5.11)
RDW: 12.6 % (ref 11.5–15.5)
WBC: 9.9 10*3/uL (ref 4.0–10.5)

## 2013-06-01 MED ORDER — ONDANSETRON HCL 4 MG/2ML IJ SOLN
4.0000 mg | Freq: Four times a day (QID) | INTRAMUSCULAR | Status: DC | PRN
  Administered 2013-06-01: 4 mg via INTRAVENOUS
  Filled 2013-06-01: qty 2

## 2013-06-01 NOTE — Progress Notes (Signed)
[facility] PROGRESS NOTE  ZAKARIA FROMER QJJ:[id number] DOB: 23-Nov-1965 DOA: 05/30/2013 PCP: Donnie Coffin, MD  Brief Narrative Lynn [name] is a 48 y.o. female with prior h/o ALL in remission, newly diagnosed DM, hypertension PE, who came in with shortness of breath and not feeling well over the last one week. She was seen at [facility] and was diagnosed with CAP, and was sent home on levaquin but she hadn't gotten better after 3 days of levaquin and came to our ED. She was found to be in mild DKA and hyperglycemic and was referred to medical service for admission.  CT chest without contrast showed ground glass opacities and early pneumonia. She was hence placed on vancomycin and Zosyn. Her white count has improved to 10,400 from 12,800. We'll therefore continue current metabolic takes pending cultures. Appreciate oncology.  Assessment and plan  CAP:  - Continue with Day 3 vancomycin and zosyn .  - Change to PO Abx tomorrow - Follow sputum cultures  - urine for strepto negative and legionella antigen pending -continue anti-tussives -low grade fevers overnight  DKA: Mild - Resolved  - Now on long-acting insulin and SSI - HGBA1C 7.4  - DM coordinator consult, Insulin teaching  Hypertension  - controlled.   ALL:  -ALL, relapsed s/p allogeneic stem cell transplant -followed by Dr.Chism and [facility] -continue  acyclovir 800 mg bid, Bactrim DS  -continue Ponatinib and Prograf-oncology following  Mild hyponatremia:  -resolved with hydration  CKD stage 1.  - Improved with gentle hydration    Elevated liver function tests:  - no abd pain.  - mild elevation. Improving. - continue to monitor.   Leukocytosis:  Probably from CAP . Improve  ANEMIA:  - apperas to be at baseline.   Thrombocytopenia:  - resolved.   Code Status: full code  Family Communication: none at bedside  Disposition Plan: home in 48hours if better   HPI/Subjective: Feels better, coughing and some  pleuritic chest discomfort.  Objective: Filed Vitals:   06/01/13 0452  BP:   Pulse:   Temp: 99.4 F (37.4 C)  Resp:     Intake/Output Summary (Last 24 hours) at 06/01/13 0956 Last data filed at 06/01/13 0601  Gross per 24 hour  Intake    250 ml  Output      0 ml  Net    250 ml   Filed Weights   05/30/13 1433  Weight: 106.5 kg (234 lb 12.6 oz)    Exam:   General:  Comfortable at rest  Cardiovascular: RRR.S1S2 normal. No murmurs.  Respiratory: Lungs diminished at R base  Abdomen: Soft and nontender. No palpable organomegaly.  Musculoskeletal: No pedal edema  Data Reviewed: Basic Metabolic Panel:  Recent Labs Lab 05/28/13 0927 05/28/13 0928 05/30/13 1000 05/30/13 1926 05/31/13 0415 06/01/13 0557  NA  --  137 134* 136* 133* 138  K  --  4.3 4.4 4.3 4.1 3.9  CL  --   --  96 101 100 103  CO2  --  19* 18* 20 21 21   GLUCOSE  --  290* 413* 199* 289* 153*  BUN  --  13.2 14 11 10 6   CREATININE  --  1.4* 1.20* 0.96 1.06 0.82  CALCIUM  --  9.8 9.6 8.9 8.7 8.5  MG 1.8  --  1.9  --   --   --    Liver Function Tests:  Recent Labs Lab 05/28/13 0928 05/30/13 1000 06/01/13 0557  AST 55* 53* 68*  ALT  81* 77* 71*  ALKPHOS 369* 370* 303*  BILITOT 0.34 0.3 0.3  PROT 7.0 7.2 6.0  ALBUMIN 3.2* 3.4* 2.6*   No results found for this basename: LIPASE, AMYLASE,  in the last 168 hours No results found for this basename: AMMONIA,  in the last 168 hours CBC:  Recent Labs Lab 05/28/13 0927 05/30/13 1000 05/31/13 0415 06/01/13 0557  WBC 12.8* 12.6* 10.4 9.9  NEUTROABS 10.1* 10.2*  --   --   HGB 10.5* 10.2* 8.9* 8.6*  HCT 31.1* 29.5* 25.4* 24.8*  MCV 100.0 95.8 95.8 95.8  PLT 287 262 238 228   Cardiac Enzymes: No results found for this basename: CKTOTAL, CKMB, CKMBINDEX, TROPONINI,  in the last 168 hours BNP (last 3 results) No results found for this basename: PROBNP,  in the last 8760 hours CBG:  Recent Labs Lab 05/31/13 1622 05/31/13 2035 06/01/13 0006  06/01/13 0402 06/01/13 0737  GLUCAP 293* 241* 155* 104* 180*    No results found for this or any previous visit (from the past 240 hour(s)).   Studies: Dg Chest 2 View  05/30/2013   CLINICAL DATA:  Shortness of breath, cough, diagnosed with pneumonia, history leukemia, hypertension, pulmonary embolism  EXAM: CHEST  2 VIEW  COMPARISON:  12/03/2012  FINDINGS: RIGHT jugular Port-A-Cath tip projecting over cavoatrial junction.  Normal heart size, mediastinal contours, and pulmonary vascularity.  Minimal RIGHT basilar atelectasis.  Lungs otherwise clear.  No pleural effusion or pneumothorax.  Bones unremarkable.  IMPRESSION: Mild RIGHT basilar atelectasis.   Electronically Signed   By: Lavonia Dana [name]   On: 05/30/2013 10:53    Scheduled Meds: . acyclovir  800 mg Oral BID  . amitriptyline  10 mg Oral QHS  . enoxaparin (LOVENOX) injection  40 mg Subcutaneous Q24H  . fluconazole  400 mg Oral Daily  . folic acid  1 mg Oral Daily  . insulin aspart  0-9 Units Subcutaneous 6 times per day  . insulin glargine  12 Units Subcutaneous QHS  . piperacillin-tazobactam (ZOSYN)  IV  3.375 g Intravenous Q8H  . ponatinib HCl  15 mg Oral Daily  . sodium chloride  10-40 mL Intracatheter Q12H  . sodium chloride  10-40 mL Intracatheter Q12H  . sulfamethoxazole-trimethoprim  1 tablet Oral Once per day on Mon Wed Fri  . tacrolimus  2 mg Oral BID  . vancomycin  1,000 mg Intravenous Q12H   Continuous Infusions:    Active Problems:   Thrombocytopenia   Leukemia, acute lymphoid   Essential hypertension   History of peripheral stem cell transplant   History of pulmonary embolism   DKA (diabetic ketoacidoses)   CAP (community acquired pneumonia)      [facility] Pager [phone]. If 7PM-7AM, please contact night-coverage at [email or web address], password [facility] 06/01/2013, 9:56 AM  LOS: 2 days

## 2013-06-02 DIAGNOSIS — D649 Anemia, unspecified: Secondary | ICD-10-CM

## 2013-06-02 DIAGNOSIS — C9100 Acute lymphoblastic leukemia not having achieved remission: Secondary | ICD-10-CM

## 2013-06-02 LAB — CBC
HCT: 24.3 % — ABNORMAL LOW (ref 36.0–46.0)
Hemoglobin: 8.6 g/dL — ABNORMAL LOW (ref 12.0–15.0)
MCH: 33.3 pg (ref 26.0–34.0)
MCHC: 35.4 g/dL (ref 30.0–36.0)
MCV: 94.2 fL (ref 78.0–100.0)
PLATELETS: 238 10*3/uL (ref 150–400)
RBC: 2.58 MIL/uL — AB (ref 3.87–5.11)
RDW: 12.7 % (ref 11.5–15.5)
WBC: 8.9 10*3/uL (ref 4.0–10.5)

## 2013-06-02 LAB — COMPREHENSIVE METABOLIC PANEL
ALT: 93 U/L — AB (ref 0–35)
AST: 106 U/L — AB (ref 0–37)
Albumin: 2.6 g/dL — ABNORMAL LOW (ref 3.5–5.2)
Alkaline Phosphatase: 355 U/L — ABNORMAL HIGH (ref 39–117)
BILIRUBIN TOTAL: 0.3 mg/dL (ref 0.3–1.2)
BUN: 5 mg/dL — ABNORMAL LOW (ref 6–23)
CALCIUM: 8.9 mg/dL (ref 8.4–10.5)
CHLORIDE: 99 meq/L (ref 96–112)
CO2: 21 meq/L (ref 19–32)
Creatinine, Ser: 0.84 mg/dL (ref 0.50–1.10)
GFR calc Af Amer: >90 mL/min (ref >90)
GFR calc non Af Amer: 81 mL/min — ABNORMAL LOW (ref >90)
Glucose, Bld: 258 mg/dL — ABNORMAL HIGH (ref 70–99)
Potassium: 3.8 mEq/L (ref 3.7–5.3)
SODIUM: 133 meq/L — AB (ref 137–147)
Total Protein: 5.9 g/dL — ABNORMAL LOW (ref 6.0–8.3)

## 2013-06-02 LAB — GLUCOSE, CAPILLARY
GLUCOSE-CAPILLARY: 158 mg/dL — AB (ref 70–99)
GLUCOSE-CAPILLARY: 187 mg/dL — AB (ref 70–99)
GLUCOSE-CAPILLARY: 191 mg/dL — AB (ref 70–99)
GLUCOSE-CAPILLARY: 200 mg/dL — AB (ref 70–99)
Glucose-Capillary: 212 mg/dL — ABNORMAL HIGH (ref 70–99)
Glucose-Capillary: 223 mg/dL — ABNORMAL HIGH (ref 70–99)

## 2013-06-02 LAB — LEGIONELLA ANTIGEN, URINE: Legionella Antigen, Urine: NEGATIVE

## 2013-06-02 MED ORDER — INSULIN ASPART 100 UNIT/ML ~~LOC~~ SOLN
0.0000 [IU] | Freq: Three times a day (TID) | SUBCUTANEOUS | Status: DC
  Administered 2013-06-02 – 2013-06-03 (×3): 2 [IU] via SUBCUTANEOUS
  Administered 2013-06-03: 1 [IU] via SUBCUTANEOUS

## 2013-06-02 MED ORDER — OXYCODONE-ACETAMINOPHEN 5-325 MG PO TABS: 1.0000 | ORAL_TABLET | ORAL | Status: DC | PRN

## 2013-06-02 MED ORDER — INSULIN GLARGINE 100 UNIT/ML ~~LOC~~ SOLN
20.0000 [IU] | Freq: Every day | SUBCUTANEOUS | Status: DC
  Administered 2013-06-02: 20 [IU] via SUBCUTANEOUS
  Filled 2013-06-02 (×2): qty 0.2

## 2013-06-02 MED ORDER — GLUCERNA SHAKE PO LIQD
237.0000 mL | Freq: Two times a day (BID) | ORAL | Status: DC
Start: 2013-06-03 — End: 2013-06-03
  Administered 2013-06-03: 237 mL via ORAL
  Filled 2013-06-02 (×2): qty 237

## 2013-06-02 MED ORDER — INSULIN ASPART 100 UNIT/ML ~~LOC~~ SOLN
4.0000 [IU] | Freq: Three times a day (TID) | SUBCUTANEOUS | Status: DC
  Administered 2013-06-02 – 2013-06-03 (×4): 4 [IU] via SUBCUTANEOUS

## 2013-06-02 MED ORDER — INSULIN ASPART 100 UNIT/ML ~~LOC~~ SOLN
0.0000 [IU] | Freq: Every day | SUBCUTANEOUS | Status: DC
  Administered 2013-06-02: 2 [IU] via SUBCUTANEOUS

## 2013-06-02 MED ORDER — LIVING WELL WITH DIABETES BOOK
Freq: Once | Status: AC
  Administered 2013-06-02: 12:00:00
  Filled 2013-06-02: qty 1

## 2013-06-02 MED ORDER — INSULIN ASPART 100 UNIT/ML ~~LOC~~ SOLN: 0.0000 [IU] | Freq: Three times a day (TID) | SUBCUTANEOUS | Status: DC

## 2013-06-02 MED ORDER — INSULIN PEN STARTER KIT
1.0000 | Freq: Once | Status: AC
  Administered 2013-06-02: 1
  Filled 2013-06-02: qty 1

## 2013-06-02 MED ORDER — HYDROCOD POLST-CHLORPHEN POLST 10-8 MG/5ML PO LQCR
5.0000 mL | Freq: Two times a day (BID) | ORAL | Status: DC
  Administered 2013-06-02 – 2013-06-03 (×3): 5 mL via ORAL
  Filled 2013-06-02 (×3): qty 5

## 2013-06-02 NOTE — Progress Notes (Signed)
Nutrition Education Note  RD consulted for nutrition education regarding diabetes.   Wt Readings from Last 3 Encounters:  05/30/13 234 lb 12.6 oz (106.5 kg)  05/28/13 239 lb 1.6 oz (108.455 kg)  04/09/13 241 lb 8 oz (109.544 kg)    Lab Results  Component Value Date   HGBA1C 7.4* 05/30/2013    RD provided "Carbohydrate Counting for People with Diabetes" handout from the Academy of Nutrition and Dietetics. Discussed different food groups and their effects on blood sugar, emphasizing carbohydrate-containing foods. Provided list of carbohydrates and recommended serving sizes of common foods. Discussed importance of controlled and consistent carbohydrate intake throughout the day. Teach back method used. RD contact information provided.   Pt has been eating waffles and fruit for breakfast and has been encouraged by MD to add more protein into meals such as yogurt. Pt consumes smoothies with fruit and juice. Discussed balanced diets, portion sizes, and diabetic friendly beverages. Reviewed how to read a food label to find serving sizes of carbohydrate containing foods. Encouraged pt to consume Glucerna shakes after d/c as pt c/o poor appetite for the past 2 weeks with 5 pound unintended weight loss. Intake improving during admission.   Expect good compliance.  Body mass index is 37.91 kg/(m^2). Pt meets criteria for class II obesity based on current BMI.  Current diet order is CHO modified medium, patient is consuming approximately 50% of meals at this time. Labs and medications reviewed. Alk phos, AST, and ALT elevated. No further nutrition interventions warranted at this time. RD contact information provided. Will order Glucerna shakes BID. If additional nutrition issues arise, please re-consult RD.  Mikey College MS, [place], [place] Pager [phone] After Hours Pager

## 2013-06-02 NOTE — Progress Notes (Signed)
Inpatient Diabetes Program Recommendations  AACE/ADA: New Consensus Statement on Inpatient Glycemic Control (2013)  Target Ranges:  Prepandial:   less than 140 mg/dL      Peak postprandial:   less than 180 mg/dL (1-2 hours)      Critically ill patients:  140 - 180 mg/dL   Reason for Visit: MD Consult  Diabetes history: New onset earlier this year.  Outpatient Diabetes medications: First med tried was Glipizide.  About a week ago glipizide was stopped and metformin 500 mg started daily Current orders for Inpatient glycemic control: Lantus 12 units at HS, Novolog sensitive correction  Note:  Patient receptive to my visit.  Says that she has never been officially told that she has diabetes, but she knows she does have it.  Began having problems with elevated glucose earlier this year.  Has a glucose meter covered by Medicaid.   A1C on admission was 7.4 indicating a mean glucose of about 166 mg/dl; however, patient describes much higher glucoses recently-- even in the 400 to 600 mg/dl range at times.  States she was to see a Microbiologist at [place], but that was too far for her to go.  She thinks her PCP is planning to send her to an Fish farm manager and subsequently she thinks for education as well.  Requested a dietitian consult.  Ordered a "Living Well with Diabetes" booklet.  Will need insulin at discharge.  Liver function labs elevated.  Showed patient the insulin pen.  Patient prefers pens over vial/syringe.  Ordered an insulin pen starter kit.  Currently patient is receiving Lantus 12 units at HS and Novolog sensitive correction scale.  Results for JENISA, MONTY (MRN [id number]) as of 06/02/2013 10:39  Ref. Range 06/01/2013 17:12 06/01/2013 20:29 06/01/2013 23:33 06/02/2013 04:06 06/02/2013 07:30  Glucose-Capillary Latest Range: 70-99 mg/dL 216 (H) 247 (H) 252 (H) 212 (H) 223 (H)   CBG this am before breakfast still sub optimal. Request MD consider the following:  Increase Lantus dosage to 20 to  25 units at HS  Add Novolog 4 units meal coverage tid with meals to be given only if patient eats at least 50% and CBG is greater than 80 mg/dl   Order Lantus Futures trader for discharge  Order adequate supply of pen needles for qid injections (100 pen needles/box- needs to be one covered by Medicaid)  [facility] place [facility] for MD to sign.  Will follow-up with patient tomorrow. Thank you.   S. Marcelline Mates, RN, CNS, CDE Inpatient Diabetes Program, team pager [phone]

## 2013-06-02 NOTE — Progress Notes (Signed)
Patient ID: Lynn [name]  female  FAO:[id number]    DOB: October 09, 1965    DOA: 05/30/2013  PCP: Donnie Coffin, MD  Assessment/Plan:  CAP:  - Continue with Day 4 vancomycin and zosyn still coughing with pleuritic chest/back pain, will continue IV antibiotics today .  - Change to PO Abx tomorrow  - Follow sputum cultures  - urine for strepto negative and legionella antigen negative -placed on Tussionex and continue Tessalon Perles  DKA: Mild , with uncontrolled diabetes mellitus - Resolved, Now on long-acting insulin and SSI  - HGBA1C 7.4  - DM coordinator consult obtained, increase the Lantus to 20 units each bedtime and NovoLog meal coverage, Insulin teaching   Hypertension  - controlled.   ALL:  -ALL, relapsed s/p allogeneic stem cell transplant  -followed by Dr.[facility] and [facility]  -continue acyclovir 800 mg bid, Bactrim DS  -continue Ponatinib and Prograf-oncology following   Mild hyponatremia: Pseudohyponatremia with a glucose of 258 today, corrected 136 -resolved with hydration   CKD stage 1.  - Improved with gentle hydration   Elevated liver function tests:  - no abd pain.   ANEMIA:  - apperas to be at baseline.   Thrombocytopenia:  - resolved.    DVT Prophylaxis:  Code Status: Full code  Family Communication:  Disposition: Discussed in detail with the patient,, wants to be DC'd in a.m.  Consultants:  Oncology  Procedures:  None  Antibiotics:  IV vancomycin  IV Zosyn  Subjective: Worried about her back pain, worse with coughing, no fever chills, eating okay  Objective: Weight change:   Intake/Output Summary (Last 24 hours) at 06/02/13 1259 Last data filed at 06/02/13 0600  Gross per 24 hour  Intake   2540 ml  Output   1950 ml  Net    590 ml   Blood pressure 141/78, pulse 106, temperature 98.6 F (37 C), temperature source Oral, resp. rate 20, height 5\' 6"  (1.676 m), weight 106.5 kg (234 lb 12.6 oz), SpO2 97.00%.  Physical  Exam: General: Alert and awake, oriented x3, coughing CVS: S1-S2 clear, no murmur rubs or gallops Chest: clear to auscultation bilaterally, no wheezing, rales or rhonchi Abdomen: soft nontender, nondistended, normal bowel sounds  Extremities: no cyanosis, clubbing or edema noted bilaterally Neuro: Cranial nerves II-XII intact, no focal neurological deficits  Lab Results: Basic Metabolic Panel:  Recent Labs Lab 05/30/13 1000  06/01/13 0557 06/02/13 0405  NA 134*  < > 138 133*  K 4.4  < > 3.9 3.8  CL 96  < > 103 99  CO2 18*  < > 21 21  GLUCOSE 413*  < > 153* 258*  BUN 14  < > 6 5*  CREATININE 1.20*  < > 0.82 0.84  CALCIUM 9.6  < > 8.5 8.9  MG 1.9  --   --   --   < > = values in this interval not displayed. Liver Function Tests:  Recent Labs Lab 06/01/13 0557 06/02/13 0405  AST 68* 106*  ALT 71* 93*  ALKPHOS 303* 355*  BILITOT 0.3 0.3  PROT 6.0 5.9*  ALBUMIN 2.6* 2.6*   No results found for this basename: LIPASE, AMYLASE,  in the last 168 hours No results found for this basename: AMMONIA,  in the last 168 hours CBC:  Recent Labs Lab 05/30/13 1000  06/01/13 0557 06/02/13 0405  WBC 12.6*  < > 9.9 8.9  NEUTROABS 10.2*  --   --   --   HGB 10.2*  < >  8.6* 8.6*  HCT 29.5*  < > 24.8* 24.3*  MCV 95.8  < > 95.8 94.2  PLT 262  < > 228 238  < > = values in this interval not displayed. Cardiac Enzymes: No results found for this basename: CKTOTAL, CKMB, CKMBINDEX, TROPONINI,  in the last 168 hours BNP: No components found with this basename: POCBNP,  CBG:  Recent Labs Lab 06/01/13 2029 06/01/13 2333 06/02/13 0406 06/02/13 0730 06/02/13 1157  GLUCAP 247* 252* 212* 223* 158*     Micro Results: No results found for this or any previous visit (from the past 240 hour(s)).  Studies/Results: Dg Chest 2 View  05/30/2013   CLINICAL DATA:  Shortness of breath, cough, diagnosed with pneumonia, history leukemia, hypertension, pulmonary embolism  EXAM: CHEST  2 VIEW   COMPARISON:  12/03/2012  FINDINGS: RIGHT jugular Port-A-Cath tip projecting over cavoatrial junction.  Normal heart size, mediastinal contours, and pulmonary vascularity.  Minimal RIGHT basilar atelectasis.  Lungs otherwise clear.  No pleural effusion or pneumothorax.  Bones unremarkable.  IMPRESSION: Mild RIGHT basilar atelectasis.   Electronically Signed   By: Lavonia Dana [name]   On: 05/30/2013 10:53    Medications: Scheduled Meds: . acyclovir  800 mg Oral BID  . amitriptyline  10 mg Oral QHS  . chlorpheniramine-HYDROcodone  5 mL Oral Q12H  . enoxaparin (LOVENOX) injection  40 mg Subcutaneous Q24H  . fluconazole  400 mg Oral Daily  . folic acid  1 mg Oral Daily  . insulin aspart  0-5 Units Subcutaneous QHS  . insulin aspart  0-9 Units Subcutaneous TID WC  . insulin aspart  4 Units Subcutaneous TID WC  . insulin glargine  20 Units Subcutaneous QHS  . piperacillin-tazobactam (ZOSYN)  IV  3.375 g Intravenous Q8H  . ponatinib HCl  15 mg Oral Daily  . sodium chloride  10-40 mL Intracatheter Q12H  . sodium chloride  10-40 mL Intracatheter Q12H  . sulfamethoxazole-trimethoprim  1 tablet Oral Once per day on Mon Wed Fri  . tacrolimus  2 mg Oral BID  . vancomycin  1,000 mg Intravenous Q12H      LOS: 3 days    Krystal Eaton [name] [facility] 06/02/2013, 12:59 PM Pager: [phone]  If 7PM-7AM, please contact night-coverage [email or web address] Password [email or web address]  **Disclaimer: This note was dictated with voice recognition software. Similar sounding words can inadvertently be transcribed and this note may contain transcription errors which may not have been corrected upon publication of note.**

## 2013-06-03 LAB — GLUCOSE, CAPILLARY
Glucose-Capillary: 105 mg/dL — ABNORMAL HIGH (ref 70–99)
Glucose-Capillary: 141 mg/dL — ABNORMAL HIGH (ref 70–99)
Glucose-Capillary: 193 mg/dL — ABNORMAL HIGH (ref 70–99)

## 2013-06-03 MED ORDER — BENZONATATE 200 MG PO CAPS: 200.0000 mg | ORAL_CAPSULE | Freq: Three times a day (TID) | ORAL | Status: DC | PRN

## 2013-06-03 MED ORDER — INSULIN ASPART 100 UNIT/ML FLEXPEN: 4.0000 [IU] | PEN_INJECTOR | Freq: Three times a day (TID) | SUBCUTANEOUS | Status: DC

## 2013-06-03 MED ORDER — INSULIN GLARGINE 100 UNIT/ML SOLOSTAR PEN: 20.0000 [IU] | PEN_INJECTOR | Freq: Every day | SUBCUTANEOUS | Status: DC

## 2013-06-03 MED ORDER — HYDROCOD POLST-CHLORPHEN POLST 10-8 MG/5ML PO LQCR: 5.0000 mL | Freq: Two times a day (BID) | ORAL | Status: DC | PRN

## 2013-06-03 MED ORDER — OXYCODONE-ACETAMINOPHEN 5-325 MG PO TABS: 1.0000 | ORAL_TABLET | Freq: Four times a day (QID) | ORAL | Status: DC | PRN

## 2013-06-03 MED ORDER — LEVOFLOXACIN 750 MG PO TABS: 750.0000 mg | ORAL_TABLET | Freq: Every day | ORAL | Status: DC

## 2013-06-03 MED ORDER — LEVOFLOXACIN 750 MG PO TABS
750.0000 mg | ORAL_TABLET | Freq: Every day | ORAL | Status: DC
  Administered 2013-06-03: 750 mg via ORAL
  Filled 2013-06-03: qty 1

## 2013-06-03 MED ORDER — FREESTYLE LANCETS MISC: Status: DC

## 2013-06-03 MED ORDER — HEPARIN SOD (PORK) LOCK FLUSH 100 UNIT/ML IV SOLN
500.0000 [IU] | INTRAVENOUS | Status: AC | PRN
  Administered 2013-06-03 (×2): 500 [IU]

## 2013-06-03 MED ORDER — TRAMADOL HCL 50 MG PO TABS: 50.0000 mg | ORAL_TABLET | Freq: Four times a day (QID) | ORAL | Status: DC | PRN

## 2013-06-03 MED ORDER — INSULIN PEN NEEDLE 32G X 5 MM MISC: Status: DC

## 2013-06-03 NOTE — Discharge Summary (Signed)
Physician Discharge Summary  Patient ID: Lynn [name] MRN: [id number] DOB/AGE: [birth year] 48 y.o.  Admit date: 05/30/2013 Discharge date: 06/03/2013  Primary Care Physician:  Donnie Coffin, MD  Discharge Diagnoses:    . DKA (diabetic ketoacidoses) with uncontrolled diabetes  . Leukemia, acute lymphoid . Essential hypertension . History of pulmonary embolism . Thrombocytopenia . CAP (community acquired pneumonia)  Consults:  Oncology, Dr Juliann Mule                    Diabetic coordinator     Recommendations for Outpatient Follow-up:  Precervical patient is started on insulin for the first time during this hospitalization. Diabetic coordinator consult and nutrition consult were obtained. Patient was recommended to keep a log of the blood sugars and bring it to the PCP for further adjustment of insulin regimen.  Allergies:   Allergies  Allergen Reactions  . Other Rash    Please do not use clear tegaderm dressings as patient states they pull her skin off.  Mepilex is fine.     Discharge Medications:   Medication List    STOP taking these medications       amLODipine 5 MG tablet  Commonly known as:  NORVASC     metFORMIN 500 MG tablet  Commonly known as:  GLUCOPHAGE      TAKE these medications       acyclovir 800 MG tablet  Commonly known as:  ZOVIRAX  800 mg. Take 1 tablet (800 mg total) by mouth 2 times daily.     amitriptyline 10 MG tablet  Commonly known as:  ELAVIL  Take 1 tablet (10 mg total) by mouth at bedtime.     benzonatate 200 MG capsule  Commonly known as:  TESSALON  Take 1 capsule (200 mg total) by mouth 3 (three) times daily as needed for cough.     chlorpheniramine-HYDROcodone 10-8 MG/5ML Lqcr  Commonly known as:  TUSSIONEX  Take 5 mLs by mouth every 12 (twelve) hours as needed for cough.     fluconazole 200 MG tablet  Commonly known as:  DIFLUCAN  Take 400 mg by mouth daily.     folic acid 1 MG tablet  Commonly known as:  FOLVITE  Take 1 mg  by mouth daily.     freestyle lancets  Use as instructed, any generic available     insulin aspart 100 UNIT/ML FlexPen  Commonly known as:  NOVOLOG FLEXPEN  Inject 4 Units into the skin 3 (three) times daily with meals. If you ate more than 50% of your meal     Insulin Glargine 100 UNIT/ML Solostar Pen  Commonly known as:  LANTUS SOLOSTAR  Inject 20 Units into the skin at bedtime.     Insulin Pen Needle 32G X 5 MM Misc  For lantus and novolog flex pen     levofloxacin 750 MG tablet  Commonly known as:  LEVAQUIN  Take 1 tablet (750 mg total) by mouth daily. For 10 days     lidocaine-prilocaine cream  Commonly known as:  EMLA  Apply 1 application topically as needed (apply to port).     multivitamin tablet  Take 1 tablet by mouth daily.     oxyCODONE-acetaminophen 5-325 MG per tablet  Commonly known as:  PERCOCET/ROXICET  Take 1 tablet by mouth every 6 (six) hours as needed for moderate pain or severe pain.     ponatinib HCl 15 MG tablet  Commonly known as:  ICLUSIG  Take 15 mg by  mouth daily.     prochlorperazine 5 MG tablet  Commonly known as:  COMPAZINE  Take 5 mg by mouth every 6 (six) hours as needed (nausea).     promethazine 12.5 MG tablet  Commonly known as:  PHENERGAN  Take 12.5 mg by mouth every 6 (six) hours as needed for nausea.     RA BLOOD GLUCOSE MONITOR Devi  Check blood glucose twice daily.     SLOW-MAG 64 MG Tbec SR tablet  Generic drug:  magnesium chloride  Take 2 tablets by mouth 3 (three) times daily.     sulfamethoxazole-trimethoprim 800-160 MG per tablet  Commonly known as:  BACTRIM DS  Take 1 tablet by mouth 3 (three) times a week. Takes 1 tablet Monday-Wednesday-Friday     tacrolimus 1 MG capsule  Commonly known as:  PROGRAF  Take 2 mg by mouth 2 (two) times daily.     traMADol 50 MG tablet  Commonly known as:  ULTRAM  Take 1 tablet (50 mg total) by mouth every 6 (six) hours as needed.     ZANTAC 300 MG tablet  Generic drug:   ranitidine  Take 300 mg by mouth at bedtime.         Brief H and P: For complete details please refer to admission H and P, but in brief Lynn [name] is a 48 y.o. female with prior h/o ALL in remission, newly diagnosed DM, hypertension PE, has been feeling sob and not well over the last one week. She was seen at [facility] and was diagnosed with CAP, was sent home on levaquin. She reports she hasn't gotten better after 3 days of levaquin and came to our ED. She was found to be in mild DKA and hyperglycemic and was referred to medical service for admission. She denies any CHEST PAIN, palpitations and orthopnea. She denies vomiting, slightly nauseated . No abdominal pain.  Reviewed the CT, it was done without contrast showed ground glass opacities and early pneumonia.   Hospital Course:   Lynn [name] is a 48 y.o. female with prior h/o ALL in remission, newly diagnosed DM, hypertension PE, who came in with shortness of breath and not feeling well over the last one week. She was seen at [facility] and was diagnosed with CAP, and was sent home on levaquin but she hadn't gotten better after 3 days of levaquin and came to our ED. She was found to be in mild DKA and hyperglycemic and was referred to medical service for admission. CT chest without contrast showed ground glass opacities and early pneumonia. She was hence placed on vancomycin and Zosyn.  CAP:  - Patient was given 5 days of IV vancomycin and Zosyn, transition to oral antibiotics prior to discharg. Urine for strept antigen  negative and legionella antigen negative. Placed on Tussionex and Tessalon Perles for cough.  DKA: Mild , with uncontrolled diabetes mellitus. Hemoglobin A1c 7.4 patient was placed on Lantus and NovoLog during the hospitalization. Diabetic coordinator consult was obtained and Insulin teaching . She was discharge on Lantus flex pen with novolog   ALL:  -ALL, relapsed s/p allogeneic stem cell transplant, followed by  Dr.[facility] and [facility]. Patient was seen by oncology inpatient.  -continue acyclovir 800 mg bid, Bactrim DS, continue Ponatinib and Prograf  Mild hyponatremia: Pseudohyponatremia with a glucose of 258 , corrected 136,resolved with hydration   CKD stage 1 - Improved with gentle hydration   Elevated liver function tests:  no abd pain.  ANEMIA: - apperas to be at baseline.   Thrombocytopenia: resolved.     Day of Discharge BP 97/55  Pulse 98  Temp(Src) 98.4 F (36.9 C) (Oral)  Resp 16  Ht 5\' 6"  (1.676 m)  Wt 106.5 kg (234 lb 12.6 oz)  BMI 37.91 kg/m2  SpO2 98%  Physical Exam: General: Alert and awake oriented x3 not in any acute distress. CVS: S1-S2 clear no murmur rubs or gallops Chest: no wheezing rales or rhonchi Abdomen: soft nontender, nondistended, normal bowel sounds Extremities: no cyanosis, clubbing or edema noted bilaterally Neuro: Cranial nerves II-XII intact, no focal neurological deficits   The results of significant diagnostics from this hospitalization (including imaging, microbiology, ancillary and laboratory) are listed below for reference.    LAB RESULTS: Basic Metabolic Panel:  Recent Labs Lab 05/30/13 1000  06/01/13 0557 06/02/13 0405  NA 134*  < > 138 133*  K 4.4  < > 3.9 3.8  CL 96  < > 103 99  CO2 18*  < > 21 21  GLUCOSE 413*  < > 153* 258*  BUN 14  < > 6 5*  CREATININE 1.20*  < > 0.82 0.84  CALCIUM 9.6  < > 8.5 8.9  MG 1.9  --   --   --   < > = values in this interval not displayed. Liver Function Tests:  Recent Labs Lab 06/01/13 0557 06/02/13 0405  AST 68* 106*  ALT 71* 93*  ALKPHOS 303* 355*  BILITOT 0.3 0.3  PROT 6.0 5.9*  ALBUMIN 2.6* 2.6*   No results found for this basename: LIPASE, AMYLASE,  in the last 168 hours No results found for this basename: AMMONIA,  in the last 168 hours CBC:  Recent Labs Lab 05/30/13 1000  06/01/13 0557 06/02/13 0405  WBC 12.6*  < > 9.9 8.9  NEUTROABS 10.2*  --   --   --   HGB 10.2*  <  > 8.6* 8.6*  HCT 29.5*  < > 24.8* 24.3*  MCV 95.8  < > 95.8 94.2  PLT 262  < > 228 238  < > = values in this interval not displayed. Cardiac Enzymes: No results found for this basename: CKTOTAL, CKMB, CKMBINDEX, TROPONINI,  in the last 168 hours BNP: No components found with this basename: POCBNP,  CBG:  Recent Labs Lab 06/03/13 0753 06/03/13 1218  GLUCAP 141* 193*    Significant Diagnostic Studies:  Dg Chest 2 View  05/30/2013   CLINICAL DATA:  Shortness of breath, cough, diagnosed with pneumonia, history leukemia, hypertension, pulmonary embolism  EXAM: CHEST  2 VIEW  COMPARISON:  12/03/2012  FINDINGS: RIGHT jugular Port-A-Cath tip projecting over cavoatrial junction.  Normal heart size, mediastinal contours, and pulmonary vascularity.  Minimal RIGHT basilar atelectasis.  Lungs otherwise clear.  No pleural effusion or pneumothorax.  Bones unremarkable.  IMPRESSION: Mild RIGHT basilar atelectasis.   Electronically Signed   By: Lavonia Dana [name]   On: 05/30/2013 10:53     Disposition and Follow-up:     Discharge Orders   Future Appointments Provider Department Dept Phone   06/11/2013 11:15 AM Elayne Snare, MD [facility] [phone]   06/12/2013 1:30 PM Philmore Pali, NP [facility] [phone]   06/27/2013 8:30 AM [facility] Only [facility] [phone]   06/27/2013 9:00 AM [facility] Provider [facility] [phone]   Future Orders Complete By Expires   Ambulatory referral to  Nutrition and Diabetic Education  As directed    Diet Carb Modified  As directed    Discharge instructions  As directed    Increase activity slowly  As directed        DISPOSITION: home   DIET: Carb modified diet    DISCHARGE FOLLOW-UP Follow-up Information   Follow up with Donnie Coffin, MD. Schedule an appointment as soon as possible for a visit in 10 days. (for hospital follow-up)     Specialty:  Family Medicine   Contact information:   [place] [place] [address] [phone]       Time spent on Discharge: 40 minutes  Signed:   Mendel Corning [name] [facility] 06/03/2013, 2:54 PM Pager: [phone]   **Disclaimer: This note was dictated with voice recognition software. Similar sounding words can inadvertently be transcribed and this note may contain transcription errors which may not have been corrected upon publication of note.**

## 2013-06-03 NOTE — Progress Notes (Signed)
Pt d/c home. Discharge education was provided. Discharge instructions/appointments reviewed with Pt. All questions were answered. Pt verbalized understanding.

## 2013-06-03 NOTE — Progress Notes (Signed)
ANTIBIOTIC CONSULT NOTE - Follow up  Pharmacy Consult for Vancomycin and Zosyn Indication: PNA in immunocompromised patient  Allergies  Allergen Reactions  . Other Rash    Please do not use clear tegaderm dressings as patient states they pull her skin off.  Mepilex is fine.    Patient Measurements: Height: 5\' 6"  (167.6 cm) Weight: 234 lb 12.6 oz (106.5 kg) IBW/kg (Calculated) : 59.3  Vital Signs: Temp: 98.4 F (36.9 C) (05/05 0427) Temp src: Oral (05/05 0427) BP: 97/55 mmHg (05/05 0427) Pulse Rate: 98 (05/05 0427) Intake/Output from previous day: 05/04 0701 - 05/05 0700 In: 900 [P.O.:360; IV Piggyback:500] Out: -  Intake/Output from this shift:    Labs:  Recent Labs  06/01/13 0557 06/02/13 0405  WBC 9.9 8.9  HGB 8.6* 8.6*  PLT 228 238  CREATININE 0.82 0.84   Estimated Creatinine Clearance: 101.1 ml/min (by C-G formula based on Cr of 0.84). 92 ml/min/1.50m2 (normalized) No results found for this basename: VANCOTROUGH, VANCOPEAK, VANCORANDOM, GENTTROUGH, GENTPEAK, GENTRANDOM, TOBRATROUGH, TOBRAPEAK, TOBRARND, AMIKACINPEAK, AMIKACINTROU, AMIKACIN,  in the last 72 hours   Microbiology: No results found for this or any previous visit (from the past 720 hour(s)).  Medical History: Past Medical History  Diagnosis Date  . Hypertension   . Brain bleed     07/04/11  . Pulmonary embolism     06/22/11  . Headache(784.0)   . Arthritis   . Anemia   . ALL (acute lymphoblastic leukemia)   . Leukemia     Medications:  Scheduled:  . acyclovir  800 mg Oral BID  . amitriptyline  10 mg Oral QHS  . chlorpheniramine-HYDROcodone  5 mL Oral Q12H  . enoxaparin (LOVENOX) injection  40 mg Subcutaneous Q24H  . feeding supplement (GLUCERNA SHAKE)  237 mL Oral BID BM  . fluconazole  400 mg Oral Daily  . folic acid  1 mg Oral Daily  . insulin aspart  0-5 Units Subcutaneous QHS  . insulin aspart  0-9 Units Subcutaneous TID WC  . insulin aspart  4 Units Subcutaneous TID WC  .  insulin glargine  20 Units Subcutaneous QHS  . piperacillin-tazobactam (ZOSYN)  IV  3.375 g Intravenous Q8H  . ponatinib HCl  15 mg Oral Daily  . sodium chloride  10-40 mL Intracatheter Q12H  . sodium chloride  10-40 mL Intracatheter Q12H  . sulfamethoxazole-trimethoprim  1 tablet Oral Once per day on Mon Wed Fri  . tacrolimus  2 mg Oral BID  . vancomycin  1,000 mg Intravenous Q12H   Infusions:    PRN: acetaminophen, benzonatate, HYDROmorphone (DILAUDID) injection, ondansetron (ZOFRAN) IV, ondansetron, oxyCODONE-acetaminophen, prochlorperazine, sodium chloride, sodium chloride, traMADol  Anti-infectives: PTA Levaquin from 4/28 stopped PTA Bactrim, Acyclovir, Fluconazole continued  5/1 >> Vanc >> 5/1 >> Zosyn >>  Assessment: 48 yo female with ALL s/p allogeneic stem cell transplant on January 8th at [facility]. Started on levaquin as outpatient for PNA diagnosed by CT last week at [facility], now presents to ED with SOB and cough. Pharmacy consulted to dose Vanc/Zosyn for PNA in immunocompromised patient. Plan to de-escalate to po antibiotics today.  Goal of Therapy:  Zosyn and Vanc dose per renal function  Plan:   Vancomycin 2g IV x 1, then 1g IV q12h Zosyn 3.375gm IV q8h (4hr extended infusions) Follow up renal function, clinical course, de-escalation of therapy    Dolly Rias RPh 06/03/2013, 11:30 AM Pager [phone]

## 2013-06-03 NOTE — Progress Notes (Signed)
Inpatient Diabetes Program Recommendations  AACE/ADA: New Consensus Statement on Inpatient Glycemic Control (2013)  Target Ranges:  Prepandial:   less than 140 mg/dL      Peak postprandial:   less than 180 mg/dL (1-2 hours)      Critically ill patients:  140 - 180 mg/dL   Note:  Follow-up visit from yesterday's encounter.  Patient denies any questions at present.  Has been unable to access the Patient Education Network-- attempted again while I was in room without success.  Gave patient a website where she can access patient education information when she gets home-- [email or web address].  Has not given an injection yet.  Discussed with patient's nurse and she will give Novolog at lunch.  Reviewed insulin pen verbally-- patient indicates understanding.  Patient recounted portions of dietitian's visit yesterday.  Will follow-up at [facility] after discharge.  Thank you.   S. Marcelline Mates, RN, CNS, CDE Inpatient Diabetes Program, team pager [phone]

## 2013-06-03 NOTE — Progress Notes (Signed)
Patient was educated on insulin injection, how to draw up her insulin, how to check her blood sugar and frequency of BS check. Pt was unable to administer insulin to herself successfully. Pt was unable to access recommended videos, patient education network was contacted by DM coordinator (patti  Prue, rn). Pt verbalized understanding of teaching.

## 2013-06-09 ENCOUNTER — Emergency Department (HOSPITAL_COMMUNITY): Payer: Medicaid Other

## 2013-06-09 ENCOUNTER — Encounter (HOSPITAL_COMMUNITY): Payer: Self-pay | Admitting: Emergency Medicine

## 2013-06-09 ENCOUNTER — Emergency Department (HOSPITAL_COMMUNITY)
Admission: EM | Admit: 2013-06-09 | Discharge: 2013-06-09 | Disposition: A | Discharge status: Other Acute Inpatient Hospital | Payer: Medicaid Other | Attending: Emergency Medicine | Admitting: Emergency Medicine

## 2013-06-09 DIAGNOSIS — J159 Unspecified bacterial pneumonia: Secondary | ICD-10-CM | POA: Insufficient documentation

## 2013-06-09 DIAGNOSIS — Z8739 Personal history of other diseases of the musculoskeletal system and connective tissue: Secondary | ICD-10-CM | POA: Insufficient documentation

## 2013-06-09 DIAGNOSIS — Z856 Personal history of leukemia: Secondary | ICD-10-CM | POA: Insufficient documentation

## 2013-06-09 DIAGNOSIS — D649 Anemia, unspecified: Secondary | ICD-10-CM | POA: Insufficient documentation

## 2013-06-09 DIAGNOSIS — Z794 Long term (current) use of insulin: Secondary | ICD-10-CM | POA: Insufficient documentation

## 2013-06-09 DIAGNOSIS — E119 Type 2 diabetes mellitus without complications: Secondary | ICD-10-CM | POA: Insufficient documentation

## 2013-06-09 DIAGNOSIS — N289 Disorder of kidney and ureter, unspecified: Secondary | ICD-10-CM | POA: Insufficient documentation

## 2013-06-09 DIAGNOSIS — Z79899 Other long term (current) drug therapy: Secondary | ICD-10-CM | POA: Insufficient documentation

## 2013-06-09 DIAGNOSIS — R000 Tachycardia, unspecified: Secondary | ICD-10-CM | POA: Insufficient documentation

## 2013-06-09 DIAGNOSIS — I10 Essential (primary) hypertension: Secondary | ICD-10-CM | POA: Insufficient documentation

## 2013-06-09 DIAGNOSIS — Z86711 Personal history of pulmonary embolism: Secondary | ICD-10-CM | POA: Insufficient documentation

## 2013-06-09 DIAGNOSIS — J189 Pneumonia, unspecified organism: Secondary | ICD-10-CM

## 2013-06-09 LAB — CBC
HCT: 24.5 % — ABNORMAL LOW (ref 36.0–46.0)
Hemoglobin: 8.8 g/dL — ABNORMAL LOW (ref 12.0–15.0)
MCH: 33 pg (ref 26.0–34.0)
MCHC: 35.9 g/dL (ref 30.0–36.0)
MCV: 91.8 fL (ref 78.0–100.0)
Platelets: 314 10*3/uL (ref 150–400)
RBC: 2.67 MIL/uL — ABNORMAL LOW (ref 3.87–5.11)
RDW: 13.2 % (ref 11.5–15.5)
WBC: 11 10*3/uL — ABNORMAL HIGH (ref 4.0–10.5)

## 2013-06-09 LAB — COMPREHENSIVE METABOLIC PANEL
ALT: 44 U/L — ABNORMAL HIGH (ref 0–35)
AST: 55 U/L — ABNORMAL HIGH (ref 0–37)
Albumin: 2.6 g/dL — ABNORMAL LOW (ref 3.5–5.2)
Alkaline Phosphatase: 458 U/L — ABNORMAL HIGH (ref 39–117)
BUN: 20 mg/dL (ref 6–23)
CO2: 20 mEq/L (ref 19–32)
Calcium: 9.1 mg/dL (ref 8.4–10.5)
Chloride: 94 mEq/L — ABNORMAL LOW (ref 96–112)
Creatinine, Ser: 1.96 mg/dL — ABNORMAL HIGH (ref 0.50–1.10)
GFR calc Af Amer: 34 mL/min — ABNORMAL LOW (ref >90)
GFR calc non Af Amer: 29 mL/min — ABNORMAL LOW (ref >90)
Glucose, Bld: 162 mg/dL — ABNORMAL HIGH (ref 70–99)
Potassium: 3.3 mEq/L — ABNORMAL LOW (ref 3.7–5.3)
Sodium: 132 mEq/L — ABNORMAL LOW (ref 137–147)
Total Bilirubin: 0.2 mg/dL — ABNORMAL LOW (ref 0.3–1.2)
Total Protein: 6.9 g/dL (ref 6.0–8.3)

## 2013-06-09 LAB — CBC WITH DIFFERENTIAL/PLATELET
Basophils Absolute: 0 10*3/uL (ref 0.0–0.1)
Basophils Relative: 0 % (ref 0–1)
EOS ABS: 0.1 10*3/uL (ref 0.0–0.7)
Eosinophils Relative: 0 % (ref 0–5)
HCT: 9.4 % — ABNORMAL LOW (ref 36.0–46.0)
Hemoglobin: 3.3 g/dL — CL (ref 12.0–15.0)
LYMPHS ABS: 2.1 10*3/uL (ref 0.7–4.0)
Lymphocytes Relative: 12 % (ref 12–46)
MCH: 32 pg (ref 26.0–34.0)
MCHC: 35.1 g/dL (ref 30.0–36.0)
MCV: 91.3 fL (ref 78.0–100.0)
MONOS PCT: 12 % (ref 3–12)
Monocytes Absolute: 2 10*3/uL — ABNORMAL HIGH (ref 0.1–1.0)
NEUTROS ABS: 12.5 10*3/uL — AB (ref 1.7–7.7)
NEUTROS PCT: 75 % (ref 43–77)
PLATELETS: 369 10*3/uL (ref 150–400)
RBC: 1.03 MIL/uL — ABNORMAL LOW (ref 3.87–5.11)
RDW: 13.1 % (ref 11.5–15.5)
WBC: 16.7 10*3/uL — ABNORMAL HIGH (ref 4.0–10.5)

## 2013-06-09 LAB — TROPONIN I: Troponin I: <0.3 ng/mL (ref <0.30)

## 2013-06-09 LAB — POC OCCULT BLOOD, ED: Fecal Occult Bld: NEGATIVE

## 2013-06-09 LAB — BILIRUBIN, DIRECT: Bilirubin, Direct: <0.2 mg/dL (ref 0.0–0.3)

## 2013-06-09 LAB — LACTATE DEHYDROGENASE: LDH: 267 U/L — AB (ref 94–250)

## 2013-06-09 LAB — TYPE AND SCREEN
ABO/RH(D): O POS
Antibody Screen: NEGATIVE

## 2013-06-09 MED ORDER — STERILE WATER FOR INJECTION IJ SOLN
INTRAMUSCULAR | Status: AC
  Administered 2013-06-09: 10 mL
  Filled 2013-06-09: qty 10

## 2013-06-09 MED ORDER — SODIUM CHLORIDE 0.9 % IV BOLUS (SEPSIS)
500.0000 mL | Freq: Once | INTRAVENOUS | Status: AC
  Administered 2013-06-09: 500 mL via INTRAVENOUS

## 2013-06-09 NOTE — ED Notes (Addendum)
Hemoccult negative.

## 2013-06-09 NOTE — ED Provider Notes (Signed)
CSN: [id number]     Arrival date & time 06/09/13  1729 History   First MD Initiated Contact with Patient 06/09/13 1812     Chief Complaint  Patient presents with  . Shortness of Breath  . Fatigue     (Consider location/radiation/quality/duration/timing/severity/associated sxs/prior Treatment) Patient is a 48 y.o. female presenting with shortness of breath. The history is provided by the patient.  Shortness of Breath Associated symptoms: chest pain and cough   Associated symptoms: no abdominal pain, no headaches, no rash and no vomiting    patient presents with left-sided chest pain or shortness of breath. She has a history of ALL in remission. She was recently admitted hospital for new onset DKA and community acquired pneumonia. She had been treated for pneumonia with antibiotics as an outpatient and then with Zosyn and vancomycin as an inpatient, followed by Levaquin as an outpatient. She states she's continued to feel bad so she was discharged 6 days ago. She states she's not getting better and is feeling worse. She has some nausea but no vomiting. She's had some chills. She states she's had a temperature up to 100.8 at home. No diarrhea or constipation. The pain is in her left lower chest. She states the pain was in her right chest previously.  Past Medical History  Diagnosis Date  . Hypertension   . Brain bleed     07/04/11  . Pulmonary embolism     06/22/11  . Headache(784.0)   . Arthritis   . Anemia   . ALL (acute lymphoblastic leukemia)   . Leukemia    Past Surgical History  Procedure Laterality Date  . Cesarean section    . Foot surgery    . Foot surgery    . Cesarean section    . Eye surgery    . Craniotomy    . Greenfield filter     Family History  Problem Relation Age of Onset  . Pancreatic cancer Father   . Cancer Mother     colon  . Pancreatic cancer Mother    History  Substance Use Topics  . Smoking status: Never Smoker   . Smokeless tobacco: Never Used   . Alcohol Use: No   OB History   Grav Para Term Preterm Abortions TAB SAB Ect Mult Living                 Review of Systems  Constitutional: Positive for appetite change and fatigue. Negative for activity change.  Eyes: Negative for pain.  Respiratory: Positive for cough and shortness of breath. Negative for chest tightness.   Cardiovascular: Positive for chest pain. Negative for leg swelling.  Gastrointestinal: Positive for nausea. Negative for vomiting, abdominal pain and diarrhea.  Genitourinary: Negative for flank pain.  Musculoskeletal: Negative for back pain and neck stiffness.  Skin: Negative for rash.  Neurological: Negative for weakness, numbness and headaches.  Psychiatric/Behavioral: Negative for behavioral problems.      Allergies  Other  Home Medications   Prior to Admission medications   Medication Sig Start Date End Date Taking? Authorizing Provider  acyclovir (ZOVIRAX) 800 MG tablet 800 mg. Take 1 tablet (800 mg total) by mouth 2 times daily. 06/03/12   Historical Provider, MD  amitriptyline (ELAVIL) 10 MG tablet Take 1 tablet (10 mg total) by mouth at bedtime. 12/13/12   Philmore Pali, NP  benzonatate (TESSALON) 200 MG capsule Take 1 capsule (200 mg total) by mouth 3 (three) times daily as needed for cough. 06/03/13  Ripudeep Krystal Eaton, MD  Blood Glucose Monitoring Suppl (RA BLOOD GLUCOSE MONITOR) DEVI Check blood glucose twice daily. 04/18/13   Historical Provider, MD  chlorpheniramine-HYDROcodone (TUSSIONEX) 10-8 MG/5ML LQCR Take 5 mLs by mouth every 12 (twelve) hours as needed for cough. 06/03/13   Ripudeep Krystal Eaton, MD  fluconazole (DIFLUCAN) 200 MG tablet Take 400 mg by mouth daily.  02/24/13   Historical Provider, MD  folic acid (FOLVITE) 1 MG tablet Take 1 mg by mouth daily.  03/13/13 03/13/14  Historical Provider, MD  insulin aspart (NOVOLOG FLEXPEN) 100 UNIT/ML FlexPen Inject 4 Units into the skin 3 (three) times daily with meals. If you ate more than 50% of your meal  06/03/13   Ripudeep K Rai, MD  Insulin Glargine (LANTUS SOLOSTAR) 100 UNIT/ML Solostar Pen Inject 20 Units into the skin at bedtime. 06/03/13   Ripudeep Krystal Eaton, MD  Insulin Pen Needle 32G X 5 MM MISC For lantus and novolog flex pen 06/03/13   Ripudeep Krystal Eaton, MD  Lancets (FREESTYLE) lancets Use as instructed, any generic available 06/03/13   Ripudeep Krystal Eaton, MD  levofloxacin (LEVAQUIN) 750 MG tablet Take 1 tablet (750 mg total) by mouth daily. For 10 days 06/03/13 06/17/13  Ripudeep Krystal Eaton, MD  lidocaine-prilocaine (EMLA) cream Apply 1 application topically as needed (apply to port).     Historical Provider, MD  magnesium chloride (SLOW-MAG) 64 MG TBEC SR tablet Take 2 tablets by mouth 3 (three) times daily.  11/29/12   Historical Provider, MD  Multiple Vitamin (MULTIVITAMIN) tablet Take 1 tablet by mouth daily.    Historical Provider, MD  oxyCODONE-acetaminophen (PERCOCET/ROXICET) 5-325 MG per tablet Take 1 tablet by mouth every 6 (six) hours as needed for moderate pain or severe pain. 06/03/13   Ripudeep Krystal Eaton, MD  ponatinib HCl (ICLUSIG) 15 MG tablet Take 15 mg by mouth daily. 05/06/13   Historical Provider, MD  prochlorperazine (COMPAZINE) 5 MG tablet Take 5 mg by mouth every 6 (six) hours as needed (nausea).  11/29/12   Historical Provider, MD  promethazine (PHENERGAN) 12.5 MG tablet Take 12.5 mg by mouth every 6 (six) hours as needed for nausea.    Historical Provider, MD  ranitidine (ZANTAC) 300 MG tablet Take 300 mg by mouth at bedtime.  03/13/13 03/13/14  Historical Provider, MD  sulfamethoxazole-trimethoprim (BACTRIM DS) 800-160 MG per tablet Take 1 tablet by mouth 3 (three) times a week. Takes 1 tablet Monday-Wednesday-Friday 03/14/13   Historical Provider, MD  tacrolimus (PROGRAF) 1 MG capsule Take 2 mg by mouth 2 (two) times daily.  03/04/13 03/04/14  Historical Provider, MD  traMADol (ULTRAM) 50 MG tablet Take 1 tablet (50 mg total) by mouth every 6 (six) hours as needed. 06/03/13   Ripudeep K Rai, MD   BP 113/66   Pulse 107  Temp(Src) 99.1 F (37.3 C) (Rectal)  Resp 20  Wt 235 lb (106.595 kg)  SpO2 97% Physical Exam  Nursing note and vitals reviewed. Constitutional: She is oriented to person, place, and time. She appears well-developed and well-nourished.  HENT:  Head: Normocephalic and atraumatic.  Eyes: EOM are normal. Pupils are equal, round, and reactive to light.  Neck: Normal range of motion. Neck supple.  Cardiovascular: Regular rhythm and normal heart sounds.   No murmur heard. Tachycardia   Pulmonary/Chest: No respiratory distress. She has no wheezes. She has no rales.  Mildly harsh breath sounds on right. Decreased breath sounds on left. Mild tachypnea mild tenderness to left posterior mid chest.  Abdominal: Soft. Bowel sounds are normal. She exhibits no distension. There is no tenderness. There is no rebound and no guarding.  Musculoskeletal: Normal range of motion.  Neurological: She is alert and oriented to person, place, and time. No cranial nerve deficit.  Skin: Skin is warm and dry. There is pallor.  Psychiatric: She has a normal mood and affect. Her speech is normal.    ED Course  Procedures (including critical care time) Labs Review Labs Reviewed  CBC WITH DIFFERENTIAL - Abnormal; Notable for the following:    WBC 16.7 (*)    RBC 1.03 (*)    Hemoglobin 3.3 (*)    HCT 9.4 (*)    Neutro Abs 12.5 (*)    Monocytes Absolute 2.0 (*)    All other components within normal limits  COMPREHENSIVE METABOLIC PANEL - Abnormal; Notable for the following:    Sodium 132 (*)    Potassium 3.3 (*)    Chloride 94 (*)    Glucose, Bld 162 (*)    Creatinine, Ser 1.96 (*)    Albumin 2.6 (*)    AST 55 (*)    ALT 44 (*)    Alkaline Phosphatase 458 (*)    Total Bilirubin 0.2 (*)    GFR calc non Af Amer 29 (*)    GFR calc Af Amer 34 (*)    All other components within normal limits  CBC - Abnormal; Notable for the following:    WBC 11.0 (*)    RBC 2.67 (*)    Hemoglobin 8.8 (*)     HCT 24.5 (*)    All other components within normal limits  LACTATE DEHYDROGENASE - Abnormal; Notable for the following:    LDH 267 (*)    All other components within normal limits  TROPONIN I  BILIRUBIN, DIRECT  HAPTOGLOBIN  POC OCCULT BLOOD, ED  TYPE AND SCREEN    Imaging Review Dg Chest 2 View  06/09/2013   CLINICAL DATA:  Chest pain and shortness of breath  EXAM: CHEST  2 VIEW  COMPARISON:  5/1/shift  FINDINGS: The cardiac shadow is stable. A right-sided chest wall port is again seen. Patchy infiltrative changes are now seen within both lungs new from the prior exam. No sizable effusion is noted. No acute bony abnormality is noted.  IMPRESSION: Patchy bilateral infiltrates.  Followup films are recommended.   Electronically Signed   By: Inez Catalina [name]   On: 06/09/2013 19:38     EKG Interpretation   Date/Time:  Monday Jun 09 2013 18:07:02 EDT Ventricular Rate:  114 PR Interval:  117 QRS Duration: 87 QT Interval:  471 QTC Calculation: 649 R Axis:   35 Text Interpretation:  Sinus tachycardia Low voltage, precordial leads  Abnormal R-wave progression, early transition Borderline T abnormalities,  lateral leads Prolonged QT interval Baseline wander in lead(s) V5  Confirmed by Alvino Chapel  MD, [place] [phone]) on 06/09/2013 6:25:53 PM      MDM   Final diagnoses:  HCAP (healthcare-associated pneumonia)  Renal insufficiency    Patient presents with shortness of breath. Hemoglobin initially found to be 3.3 with a white count of 16 and normal hemoglobin. Hemoglobin had previously been higher in route had been trending down. She has a history of ALL and is in remission, but still on treatment. She was recently admitted for healthcare associated pneumonia and new onset DKA. Patient said she's been feeling bad since. She is afebrile, but tachycardic here. I discussed with Dr. Nydia Bouton at [facility],  who accepted the patient in transfer. some new labs were ordered and hemoglobin  is rechecked. Dr. Nydia Bouton no antibiotics be given in the ER here and there were give them at [facility] before the blood cultures. Chest x-ray showed worsening pneumonia. Repeat hemoglobin was at her baseline. The accepting doctor was notified of this. Patient was transferred to [facility]. Alvino Chapel, MD 06/10/13 0002

## 2013-06-09 NOTE — ED Notes (Signed)
Pt states that she was discharged from here last TUesday after being treated for PNA and states that she is still having trouble breathing and fatigue with intermittent fevers.  Pt has leukemia.

## 2013-06-10 ENCOUNTER — Other Ambulatory Visit: Payer: Self-pay | Admitting: Internal Medicine

## 2013-06-10 ENCOUNTER — Telehealth: Payer: Self-pay | Admitting: Internal Medicine

## 2013-06-10 DIAGNOSIS — J189 Pneumonia, unspecified organism: Secondary | ICD-10-CM | POA: Insufficient documentation

## 2013-06-10 LAB — HAPTOGLOBIN: Haptoglobin: 456 mg/dL — ABNORMAL HIGH (ref 45–215)

## 2013-06-10 NOTE — Telephone Encounter (Signed)
s/w relative re appt for 5/29. per 5/12 pof pt d/c from hosp last week f/u ASAP. pt already on schedule 5/29. schedule mailed.Marland Kitchen

## 2013-06-11 ENCOUNTER — Ambulatory Visit: Payer: Medicaid Other | Admitting: Endocrinology

## 2013-06-12 ENCOUNTER — Ambulatory Visit: Payer: Medicaid Other | Admitting: Nurse Practitioner

## 2013-06-19 HISTORY — PX: LUNG BIOPSY: SHX232

## 2013-06-27 ENCOUNTER — Telehealth: Payer: Self-pay

## 2013-06-27 ENCOUNTER — Other Ambulatory Visit: Payer: Medicaid Other

## 2013-06-27 ENCOUNTER — Ambulatory Visit: Payer: Medicaid Other

## 2013-06-27 NOTE — Telephone Encounter (Signed)
lvm pt missed appt, please call back to r/s

## 2013-07-02 ENCOUNTER — Ambulatory Visit: Payer: Medicaid Other

## 2013-07-09 ENCOUNTER — Ambulatory Visit: Payer: Medicaid Other

## 2013-07-16 ENCOUNTER — Ambulatory Visit: Payer: Medicaid Other

## 2013-07-30 ENCOUNTER — Encounter: Payer: Self-pay | Admitting: Endocrinology

## 2013-07-30 ENCOUNTER — Ambulatory Visit (INDEPENDENT_AMBULATORY_CARE_PROVIDER_SITE_OTHER): Payer: Medicaid Other | Admitting: Endocrinology

## 2013-07-30 VITALS — BP 138/98 | HR 124 | Temp 98.3°F | Resp 16 | Ht 67.0 in | Wt 224.8 lb

## 2013-07-30 DIAGNOSIS — IMO0002 Reserved for concepts with insufficient information to code with codable children: Secondary | ICD-10-CM | POA: Insufficient documentation

## 2013-07-30 DIAGNOSIS — I10 Essential (primary) hypertension: Secondary | ICD-10-CM

## 2013-07-30 DIAGNOSIS — Z794 Long term (current) use of insulin: Secondary | ICD-10-CM

## 2013-07-30 DIAGNOSIS — IMO0001 Reserved for inherently not codable concepts without codable children: Secondary | ICD-10-CM

## 2013-07-30 DIAGNOSIS — E1165 Type 2 diabetes mellitus with hyperglycemia: Secondary | ICD-10-CM | POA: Insufficient documentation

## 2013-07-30 NOTE — Patient Instructions (Addendum)
Lantus 12 units in am only and reduce to 10 if am sugar <90 in ams  Novolog 8 units before Breakfast and lunch, 5 at supper  If sugars after supper are < 100 then stop supper dose  Metformin 1 with lunch also, call when needing refill  Protein at breakfast  Please check blood sugars at least half the time about 2 hours after any meal and 4 times per week on waking up. Please bring blood sugar monitor to each visit

## 2013-07-30 NOTE — Progress Notes (Signed)
Patient ID: LEGACI TARMAN, female   DOB: [birth year], 48 y.o.   MRN: [id number]    Reason for Appointment: Consultation for Type 2 Diabetes  Referring physician: Donnie Coffin  History of Present Illness:          Diagnosis: Type 2 diabetes mellitus, date of diagnosis: 2013        Past history: She weighed nearly 300 lb in 2013 around that time her diabetes was diagnosed Although her A1c at that time was reported at 7.4 she does not remember being told about diabetes and no treatment was given  Recent history:  In 5/15 when she was getting steroids for her bone marrow transplant her blood sugar was over 400 She was started on insulin and has been taking a basal bolus insulin regimen by her oncologist She has not had any insulin adjustments made and is referred here for further management She has not had any diabetes education except initial information given in the hospital Also was given metformin 1 g twice a day but because of diarrhea she has taken only 500 mg twice a day.  She has been checking her blood sugar very sporadically and only once a day in the last 4 days, some readings before breakfast and some.lunchtime. Glucose readings from labs have fluctuated between 99-251 last month       Oral hypoglycemic drugs the patient is taking are: Metformin      Side effects from medications have been: Diarrhea from Metformin 4 daily INSULIN regimen is described as: NovoLog 5 units with meals, Lantus 15 at bedtime     Glucose monitoring:  done one time a day         Glucometer:  Accu-Chek       Blood Glucose readings from meter download: Recent fasting 84-121, around 1-5 PM = 186-242  Hypoglycemia:   none     Glycemic control:  Lab Results  Component Value Date   HGBA1C 7.4* 05/30/2013   HGBA1C 7.4* 06/17/2011   Lab Results  Component Value Date   CREATININE 1.96* 06/09/2013    Self-care: The diet that the patient has been following is: tries to limit  Portions, some sweets    Meals: 3 meals per day. Light supper usually, often eating eggs with breakfast but today had pancakes and fruit only           Exercise: walks 30-45 min         Dietician visit: Most recent: 5/15 at the hospital              Compliance with the medical regimen: Fair Retinal exam: Most recent:.2013     Weight history: Wt Readings from Last 3 Encounters:  07/30/13 224 lb 12.8 oz (101.969 kg)  06/09/13 235 lb (106.595 kg)  05/30/13 234 lb 12.6 oz (106.5 kg)      Medication List       This list is accurate as of: 07/30/13  2:35 PM.  Always use your most recent med list.               acyclovir 800 MG tablet  Commonly known as:  ZOVIRAX  Take 800 mg by mouth 2 (two) times daily. Marland Kitchen     amitriptyline 10 MG tablet  Commonly known as:  ELAVIL  Take 1 tablet (10 mg total) by mouth at bedtime.     benzonatate 200 MG capsule  Commonly known as:  TESSALON  Take 1 capsule (200 mg total) by mouth  3 (three) times daily as needed for cough.     fluconazole 200 MG tablet  Commonly known as:  DIFLUCAN  Take 400 mg by mouth daily.     folic acid 1 MG tablet  Commonly known as:  FOLVITE  Take 1 mg by mouth daily.     guaiFENesin-dextromethorphan 100-10 MG/5ML syrup  Commonly known as:  ROBITUSSIN DM  Take 15 mLs by mouth every 4 (four) hours as needed for cough.     insulin aspart 100 UNIT/ML FlexPen  Commonly known as:  NOVOLOG  Inject 5 Units into the skin 3 (three) times daily with meals. If you ate more than 50% of your meal     Insulin Glargine 100 UNIT/ML Solostar Pen  Commonly known as:  LANTUS  Inject 15 Units into the skin at bedtime.     levofloxacin 750 MG tablet  Commonly known as:  LEVAQUIN  Take 750 mg by mouth daily.     lidocaine-prilocaine cream  Commonly known as:  EMLA  Apply 1 application topically as needed (apply to port).     metFORMIN 500 MG tablet  Commonly known as:  GLUCOPHAGE  Take 500 mg by mouth 2 (two) times daily with a meal.      montelukast 10 MG tablet  Commonly known as:  SINGULAIR  Take 10 mg by mouth at bedtime.     multivitamin tablet  Take 1 tablet by mouth daily.     oxyCODONE-acetaminophen 5-325 MG per tablet  Commonly known as:  PERCOCET/ROXICET  Take 1 tablet by mouth every 6 (six) hours as needed for moderate pain or severe pain.     ponatinib HCl 15 MG tablet  Commonly known as:  ICLUSIG  Take 15 mg by mouth daily.     predniSONE 20 MG tablet  Commonly known as:  DELTASONE  Take 20 mg by mouth daily with breakfast.     prochlorperazine 5 MG tablet  Commonly known as:  COMPAZINE  Take 5 mg by mouth every 6 (six) hours as needed (nausea).     promethazine 12.5 MG tablet  Commonly known as:  PHENERGAN  Take 12.5 mg by mouth every 6 (six) hours as needed for nausea.     SLOW-MAG 64 MG Tbec SR tablet  Generic drug:  magnesium chloride  Take 2 tablets by mouth 3 (three) times daily.     sulfamethoxazole-trimethoprim 800-160 MG per tablet  Commonly known as:  BACTRIM DS  Take 1 tablet by mouth 3 (three) times a week. Takes 1 tablet Monday-Wednesday-Friday     tacrolimus 1 MG capsule  Commonly known as:  PROGRAF  Take 2 mg by mouth 2 (two) times daily.     traMADol 50 MG tablet  Commonly known as:  ULTRAM  Take 1 tablet (50 mg total) by mouth every 6 (six) hours as needed.     ZANTAC 300 MG tablet  Generic drug:  ranitidine  Take 300 mg by mouth at bedtime.        Allergies:  Allergies  Allergen Reactions  . Other Rash    Please do not use clear tegaderm dressings as patient states they pull her skin off.  Mepilex is fine.    Past Medical History  Diagnosis Date  . Hypertension   . Brain bleed     07/04/11  . Pulmonary embolism     06/22/11  . Headache(784.0)   . Arthritis   . Anemia   . ALL (acute lymphoblastic leukemia)   .  Leukemia     Past Surgical History  Procedure Laterality Date  . Cesarean section    . Foot surgery    . Foot surgery    . Cesarean section     . Eye surgery    . Craniotomy    . Greenfield filter      Family History  Problem Relation Age of Onset  . Pancreatic cancer Father   . Cancer Mother     colon  . Pancreatic cancer Mother     Social History:  reports that she has never smoked. She has never used smokeless tobacco. She reports that she does not drink alcohol or use illicit drugs.    Review of Systems       Lipids: Unknown       No results found for this basename: CHOL,  HDL,  LDLCALC,  LDLDIRECT,  TRIG,  CHOLHDL       Skin: No rash or infections     Thyroid:  No  unusual fatigue. She had a thyroid biopsy in 6/14 for a left-sided thyroid nodule which was benign     She has had significant anemia as part of her problem with leukemia and recent hemoglobin was over 10     The blood pressure has been treated previously with Norvasc, this was recently stopped     No swelling of feet.     No shortness of breath on exertion.  Pnuemonia in 5/15     Bowel habits: Normal.       No frequency of urination or nocturia       No joint  pains.      Tired in her legs especially when she is trying to go upstairs          No history of Numbness, tingling but has some burning in feet and fingers at times   All other  review of systems is negative    Physical Examination:  BP 138/98  Pulse 124  Temp(Src) 98.3 F (36.8 C)  Resp 16  Ht 5\' 7"  (1.702 m)  Wt 224 lb 12.8 oz (101.969 kg)  BMI 35.20 kg/m2  SpO2 98%  GENERAL:         Patient has generalized obesity.   HEENT:         Eye exam shows normal external appearance. Fundus exam shows no retinopathy. Oral exam shows normal mucosa .  NECK:         General:  Neck exam shows no lymphadenopathy. Carotids are normal to palpation and no bruit heard.  Thyroid exam shows 2 cm left-sided nodule, right lobe was just palpable on swallowing LUNGS:         Chest is symmetrical. Lungs are clear to auscultation.Marland Kitchen   HEART:         Heart sounds:  S1 and S2 are normal.  No murmurs or clicks heard., no S3 or S4.   ABDOMEN:   There is no distention present. Liver and spleen are not palpable. No other mass or tenderness present.  EXTREMITIES:     There is no edema. No skin lesions present.Marland Kitchen  NEUROLOGICAL:   Vibration sense is moderately reduced in toes. Ankle jerks are absent bilaterally.          Diabetic foot exam:   normal MUSCULOSKELETAL:       There is no enlargement or deformity of the joints. Spine is normal to inspection.Marland Kitchen   SKIN:       No rash or  skin lesions, no significant acanthosis.        ASSESSMENT:  Diabetes type 2, uncontrolled   Although her A1c is only mildly high she has had rather high blood sugars around that time she had high-dose prednisone and other steroids for her leukemia management Since she is doing fairly well on basal bolus insulin regimen we will continue this program; she is compliant with the mealtime injections Her fasting blood sugars appear to be fairly good but including today higher postprandial readings are tending to be high Discussed with patient that she will need a higher dose of NovoLog to cover her meals Also needs more diabetes education including meal planning She is trying to exercise and keep her weight down and encouraged her to continue She does however need to check her blood sugars more frequently than once a day    Complications: None evident, needs urine microalbumin tested and regular eye exams  Thyroid nodule: Clinically stable, benign biopsy last year  Hypertension: Blood pressure is high and she should resume her Norvasc  Lipids: Unknown status  PLAN:   Start checking blood sugars regularly at least twice a day especially after meals. Explained to the patient that since she is on prednisone her blood sugars are going to be mostly higher during the day and not fasting  Discussed blood sugar targets of at least <180  Increase NovoLog at breakfast and lunch to 8 units for now and continue 5  units at suppertime  Change Lantus to the morning and reduce dose to 12 units, discussed adjustment based on blood sugar in the morning every 3 days  Consider tapering off her insulin and using other regimens for diabetes once her prednisone dose is significantly lower  Preventive care including regular eye exams and foot care discussed  Total visit time including counseling = 60 minutes   , 07/30/2013, 2:35 PM   Note: This office note was prepared with Dragon voice recognition system technology. Any transcriptional errors that result from this process are unintentional.

## 2013-08-05 ENCOUNTER — Telehealth: Payer: Self-pay | Admitting: Nurse Practitioner

## 2013-08-05 ENCOUNTER — Ambulatory Visit: Payer: Medicaid Other | Admitting: Nurse Practitioner

## 2013-08-05 NOTE — Telephone Encounter (Signed)
Patient was no show for today's office appointment.  

## 2013-08-07 ENCOUNTER — Encounter: Payer: Self-pay | Admitting: Interventional Cardiology

## 2013-08-08 DIAGNOSIS — J8489 Other specified interstitial pulmonary diseases: Secondary | ICD-10-CM | POA: Insufficient documentation

## 2013-08-20 ENCOUNTER — Emergency Department (HOSPITAL_COMMUNITY)
Admission: EM | Admit: 2013-08-20 | Discharge: 2013-08-20 | Disposition: A | Discharge status: Home / Self Care | Payer: Medicaid Other | Attending: Emergency Medicine | Admitting: Emergency Medicine

## 2013-08-20 ENCOUNTER — Ambulatory Visit: Payer: Medicaid Other | Admitting: Endocrinology

## 2013-08-20 ENCOUNTER — Encounter (HOSPITAL_COMMUNITY): Payer: Self-pay | Admitting: Emergency Medicine

## 2013-08-20 ENCOUNTER — Emergency Department (HOSPITAL_COMMUNITY): Payer: Medicaid Other

## 2013-08-20 DIAGNOSIS — I10 Essential (primary) hypertension: Secondary | ICD-10-CM | POA: Diagnosis not present

## 2013-08-20 DIAGNOSIS — Z79899 Other long term (current) drug therapy: Secondary | ICD-10-CM | POA: Diagnosis not present

## 2013-08-20 DIAGNOSIS — M129 Arthropathy, unspecified: Secondary | ICD-10-CM | POA: Insufficient documentation

## 2013-08-20 DIAGNOSIS — G43909 Migraine, unspecified, not intractable, without status migrainosus: Secondary | ICD-10-CM | POA: Diagnosis not present

## 2013-08-20 DIAGNOSIS — R112 Nausea with vomiting, unspecified: Secondary | ICD-10-CM | POA: Diagnosis not present

## 2013-08-20 DIAGNOSIS — IMO0002 Reserved for concepts with insufficient information to code with codable children: Secondary | ICD-10-CM | POA: Insufficient documentation

## 2013-08-20 DIAGNOSIS — Z794 Long term (current) use of insulin: Secondary | ICD-10-CM | POA: Diagnosis not present

## 2013-08-20 DIAGNOSIS — E860 Dehydration: Secondary | ICD-10-CM | POA: Insufficient documentation

## 2013-08-20 DIAGNOSIS — D649 Anemia, unspecified: Secondary | ICD-10-CM | POA: Diagnosis not present

## 2013-08-20 DIAGNOSIS — R197 Diarrhea, unspecified: Secondary | ICD-10-CM | POA: Insufficient documentation

## 2013-08-20 DIAGNOSIS — E119 Type 2 diabetes mellitus without complications: Secondary | ICD-10-CM | POA: Insufficient documentation

## 2013-08-20 DIAGNOSIS — R000 Tachycardia, unspecified: Secondary | ICD-10-CM | POA: Insufficient documentation

## 2013-08-20 DIAGNOSIS — E872 Acidosis, unspecified: Secondary | ICD-10-CM | POA: Insufficient documentation

## 2013-08-20 DIAGNOSIS — R51 Headache: Secondary | ICD-10-CM | POA: Diagnosis present

## 2013-08-20 DIAGNOSIS — G43009 Migraine without aura, not intractable, without status migrainosus: Secondary | ICD-10-CM

## 2013-08-20 DIAGNOSIS — Z86711 Personal history of pulmonary embolism: Secondary | ICD-10-CM | POA: Diagnosis not present

## 2013-08-20 DIAGNOSIS — Z792 Long term (current) use of antibiotics: Secondary | ICD-10-CM | POA: Insufficient documentation

## 2013-08-20 LAB — COMPREHENSIVE METABOLIC PANEL
ALBUMIN: 3 g/dL — AB (ref 3.5–5.2)
ALT: 102 U/L — ABNORMAL HIGH (ref 0–35)
ANION GAP: 17 — AB (ref 5–15)
AST: 60 U/L — ABNORMAL HIGH (ref 0–37)
Alkaline Phosphatase: 150 U/L — ABNORMAL HIGH (ref 39–117)
BILIRUBIN TOTAL: 0.2 mg/dL — AB (ref 0.3–1.2)
BUN: 20 mg/dL (ref 6–23)
CO2: 20 mEq/L (ref 19–32)
Calcium: 8.9 mg/dL (ref 8.4–10.5)
Chloride: 101 mEq/L (ref 96–112)
Creatinine, Ser: 1.29 mg/dL — ABNORMAL HIGH (ref 0.50–1.10)
GFR calc Af Amer: 56 mL/min — ABNORMAL LOW (ref >90)
GFR calc non Af Amer: 48 mL/min — ABNORMAL LOW (ref >90)
Glucose, Bld: 300 mg/dL — ABNORMAL HIGH (ref 70–99)
POTASSIUM: 3.8 meq/L (ref 3.7–5.3)
Sodium: 138 mEq/L (ref 137–147)
Total Protein: 5.7 g/dL — ABNORMAL LOW (ref 6.0–8.3)

## 2013-08-20 LAB — CBC WITH DIFFERENTIAL/PLATELET
BASOS PCT: 0 % (ref 0–1)
Basophils Absolute: 0 10*3/uL (ref 0.0–0.1)
Eosinophils Absolute: 0.1 10*3/uL (ref 0.0–0.7)
Eosinophils Relative: 1 % (ref 0–5)
HCT: 29.7 % — ABNORMAL LOW (ref 36.0–46.0)
HEMOGLOBIN: 10 g/dL — AB (ref 12.0–15.0)
LYMPHS ABS: 1 10*3/uL (ref 0.7–4.0)
LYMPHS PCT: 15 % (ref 12–46)
MCH: 32.1 pg (ref 26.0–34.0)
MCHC: 33.7 g/dL (ref 30.0–36.0)
MCV: 95.2 fL (ref 78.0–100.0)
Monocytes Absolute: 0.4 10*3/uL (ref 0.1–1.0)
Monocytes Relative: 6 % (ref 3–12)
NEUTROS PCT: 78 % — AB (ref 43–77)
Neutro Abs: 5.2 10*3/uL (ref 1.7–7.7)
Platelets: 177 10*3/uL (ref 150–400)
RBC: 3.12 MIL/uL — AB (ref 3.87–5.11)
RDW: 19.2 % — ABNORMAL HIGH (ref 11.5–15.5)
WBC: 6.6 10*3/uL (ref 4.0–10.5)

## 2013-08-20 LAB — CBG MONITORING, ED: Glucose-Capillary: 234 mg/dL — ABNORMAL HIGH (ref 70–99)

## 2013-08-20 LAB — URINALYSIS, ROUTINE W REFLEX MICROSCOPIC
BILIRUBIN URINE: NEGATIVE
Glucose, UA: >1000 mg/dL — AB
KETONES UR: NEGATIVE mg/dL
NITRITE: NEGATIVE
Protein, ur: 30 mg/dL — AB
SPECIFIC GRAVITY, URINE: 1.023 (ref 1.005–1.030)
UROBILINOGEN UA: 0.2 mg/dL (ref 0.0–1.0)
pH: 5 (ref 5.0–8.0)

## 2013-08-20 LAB — URINE MICROSCOPIC-ADD ON

## 2013-08-20 LAB — I-STAT CG4 LACTIC ACID, ED
Lactic Acid, Venous: 4.57 mmol/L — ABNORMAL HIGH (ref 0.5–2.2)
Lactic Acid, Venous: 1.37 mmol/L (ref 0.5–2.2)

## 2013-08-20 MED ORDER — ONDANSETRON HCL 4 MG PO TABS: 4.0000 mg | ORAL_TABLET | Freq: Three times a day (TID) | ORAL | Status: DC | PRN

## 2013-08-20 MED ORDER — MAGNESIUM SULFATE 40 MG/ML IJ SOLN
2.0000 g | Freq: Once | INTRAMUSCULAR | Status: AC
  Administered 2013-08-20: 2 g via INTRAVENOUS
  Filled 2013-08-20: qty 50

## 2013-08-20 MED ORDER — HEPARIN SOD (PORK) LOCK FLUSH 100 UNIT/ML IV SOLN
500.0000 [IU] | Freq: Once | INTRAVENOUS | Status: AC
  Administered 2013-08-20: 500 [IU]
  Filled 2013-08-20: qty 5

## 2013-08-20 MED ORDER — PROCHLORPERAZINE EDISYLATE 5 MG/ML IJ SOLN
10.0000 mg | Freq: Four times a day (QID) | INTRAMUSCULAR | Status: DC | PRN
  Administered 2013-08-20: 10 mg via INTRAVENOUS
  Filled 2013-08-20: qty 2

## 2013-08-20 MED ORDER — SODIUM CHLORIDE 0.9 % IV SOLN
1000.0000 mL | Freq: Once | INTRAVENOUS | Status: AC
  Administered 2013-08-20: 1000 mL via INTRAVENOUS

## 2013-08-20 MED ORDER — DIPHENHYDRAMINE HCL 50 MG/ML IJ SOLN
25.0000 mg | Freq: Once | INTRAMUSCULAR | Status: AC
  Administered 2013-08-20: 25 mg via INTRAVENOUS
  Filled 2013-08-20: qty 1

## 2013-08-20 MED ORDER — METOCLOPRAMIDE HCL 5 MG/ML IJ SOLN
10.0000 mg | Freq: Once | INTRAMUSCULAR | Status: AC
  Administered 2013-08-20: 10 mg via INTRAVENOUS
  Filled 2013-08-20: qty 2

## 2013-08-20 MED ORDER — SODIUM CHLORIDE 0.9 % IV SOLN
1000.0000 mL | INTRAVENOUS | Status: DC
  Administered 2013-08-20: 1000 mL via INTRAVENOUS

## 2013-08-20 MED ORDER — KETOROLAC TROMETHAMINE 30 MG/ML IJ SOLN
30.0000 mg | Freq: Once | INTRAMUSCULAR | Status: AC
  Administered 2013-08-20: 30 mg via INTRAVENOUS
  Filled 2013-08-20: qty 1

## 2013-08-20 MED ORDER — SODIUM CHLORIDE 0.9 % IV BOLUS (SEPSIS)
1000.0000 mL | Freq: Once | INTRAVENOUS | Status: AC
  Administered 2013-08-20: 1000 mL via INTRAVENOUS

## 2013-08-20 NOTE — ED Notes (Signed)
Pt encouraged to provide stool sample several times since arrival. Pt unable to provide stool sample. MD aware.

## 2013-08-20 NOTE — Progress Notes (Signed)
[facility] provided pt with a list of primary care resources and a [facility] [employer] Card application to help patient establish primary care.  °

## 2013-08-20 NOTE — ED Notes (Signed)
I Knapp notified of pt current temperature reading and that pt states she feels jittery and request sugar checked. MD verbalizes okay to check CBG.

## 2013-08-20 NOTE — ED Notes (Signed)
Lactic Acid 4.57 RN Lilia Pro and [facility] notified of results

## 2013-08-20 NOTE — ED Notes (Signed)
Pt reports SOB and diarrhea over past month. Pt reports new onset of headache last night, constant since. Pt denies abdominal pain but reports SOB/CP with deep breath for past month. Pt reports left sided chest tube a month ago for pneumonia but reports site still sore. Site healed with approximated scarring.

## 2013-08-20 NOTE — ED Notes (Signed)
Pt has leukemia, pt c/o diarrhea x 1 month, SOB constantly and headache x 1days. Pt states she feels like her heart is racing at times.

## 2013-08-20 NOTE — Discharge Instructions (Signed)
Drink plenty of fluids. Use the zofran for nausea or vomiting. If you continue to have diarrhea, try to get a sample of your stool to be tested for C difficile and regular stool cultures.

## 2013-08-20 NOTE — ED Notes (Signed)
Pt denies nausea. Pt reports head "hurts a little" but rates pain 5/10.

## 2013-08-20 NOTE — ED Notes (Signed)
I Knapp aware of pt unchanged response to pain.

## 2013-08-20 NOTE — ED Provider Notes (Signed)
CSN: [id number]     Arrival date & time 08/20/13  3500 History   First MD Initiated Contact with Patient 08/20/13 [phone]     Chief Complaint  Patient presents with  . Diarrhea  . Shortness of Breath  . Headache     (Consider location/radiation/quality/duration/timing/severity/associated sxs/prior Treatment) HPI Patient has a history of a ALL status post bone marrow transplant now in remission. She reports she was admitted at [facility] from May 12 through June 4 with pneumonia. She had a biopsy of her left  lung done which she states showed "COP". She reports she finished her antibiotics 2 weeks ago. She reports she's had diarrhea for the past month. She has diarrhea about 6 times a day described as watery. She states for the past 2 days she's had some lower abdominal crampiness with diarrhea. She has had nausea and has had some vomiting however the last vomiting episode was 5 days ago. She does continue to have constant nausea. She states she feels weak but denies dizziness or feeling lightheaded. She states when she walks she feels unsteady. She reports normal urinary output. She states she has dyspnea on exertion and she also feels like her heart races when she exerts herself. She denies any known fever or chills but states she felt cold yesterday. Patient has developed diabetes while on steroids for her bone marrow transplant. She states about 2 weeks ago she saw her oncologist at [place] and they felt they had tapered her off her prednisone too quickly. She was started back on her prednisone 2 weeks ago however the diarrhea continues. She states her blood sugars are typically about 120 in the morning and during the day go up to like 300. She denies being around anybody else is ill.  Patient also has a history of migraines however she's not had one for while. She states last night she started getting a bilateral frontal constant dull headache with some light and noise  sensitivity similar to her migraine she's had in the past. She states however today the headache is not as bad as what she's had in the past.  PCP Dr [facility] [place]  Past Medical History  Diagnosis Date  . Hypertension   . Brain bleed     07/04/11  . Pulmonary embolism     06/22/11  . Headache(784.0)   . Arthritis   . Anemia   . ALL (acute lymphoblastic leukemia)   . Leukemia   . Diabetes mellitus without complication    Past Surgical History  Procedure Laterality Date  . Cesarean section    . Foot surgery    . Foot surgery    . Cesarean section    . Eye surgery    . Craniotomy    . [place]field filter     Family History  Problem Relation Age of Onset  . Pancreatic cancer Father   . Cancer Mother     colon  . Pancreatic cancer Mother   . Diabetes Neg Hx    History  Substance Use Topics  . Smoking status: Never Smoker   . Smokeless tobacco: Never Used  . Alcohol Use: No   Lives at home Lives with children  OB History   Grav Para Term Preterm Abortions TAB SAB Ect Mult Living                 Review of Systems  All other systems reviewed and are negative.  Allergies  Other  Home Medications   Prior to Admission medications   Medication Sig Start Date End Date Taking? Authorizing Provider  acyclovir (ZOVIRAX) 800 MG tablet Take 800 mg by mouth 2 (two) times daily. . 06/03/12  Yes Historical Provider, MD  amitriptyline (ELAVIL) 10 MG tablet Take 1 tablet (10 mg total) by mouth at bedtime. 12/13/12  Yes Philmore Pali, NP  fluconazole (DIFLUCAN) 200 MG tablet Take 400 mg by mouth daily.  02/24/13  Yes Historical Provider, MD  folic acid (FOLVITE) 1 MG tablet Take 1 mg by mouth daily.  03/13/13 03/13/14 Yes Historical Provider, MD  insulin aspart (NOVOLOG) 100 UNIT/ML FlexPen Inject 5 Units into the skin 3 (three) times daily with meals. (If you have eaten more than 50% of your meal.) 06/03/13  Yes Ripudeep K Rai, MD  Insulin Glargine (LANTUS) 100 UNIT/ML  Solostar Pen Inject into the skin every morning. Sliding scale. 06/03/13  Yes Ripudeep Krystal Eaton, MD  lidocaine-prilocaine (EMLA) cream Apply 1 application topically as needed (apply to port).    Yes Historical Provider, MD  magnesium chloride (SLOW-MAG) 64 MG TBEC SR tablet Take 2 tablets by mouth 3 (three) times daily.  11/29/12  Yes Historical Provider, MD  metFORMIN (GLUCOPHAGE) 500 MG tablet Take 500 mg by mouth 2 (two) times daily with a meal.   Yes Historical Provider, MD  Multiple Vitamin (MULTIVITAMIN) tablet Take 1 tablet by mouth daily.   Yes Historical Provider, MD  oxyCODONE-acetaminophen (PERCOCET/ROXICET) 5-325 MG per tablet Take 1 tablet by mouth every 6 (six) hours as needed for moderate pain or severe pain. 06/03/13  Yes Ripudeep Krystal Eaton, MD  predniSONE (DELTASONE) 10 MG tablet Take 10 mg by mouth daily with breakfast.   Yes Historical Provider, MD  prochlorperazine (COMPAZINE) 5 MG tablet Take 5 mg by mouth every 6 (six) hours as needed (nausea).  11/29/12  Yes Historical Provider, MD  sulfamethoxazole-trimethoprim (BACTRIM DS) 800-160 MG per tablet Take 1 tablet by mouth 3 (three) times a week. Takes 1 tablet Monday-Wednesday-Friday 03/14/13  Yes Historical Provider, MD  tacrolimus (PROGRAF) 1 MG capsule Take 2 mg by mouth 2 (two) times daily.  03/04/13 03/04/14 Yes Historical Provider, MD  traMADol (ULTRAM) 50 MG tablet Take 50 mg by mouth every 6 (six) hours as needed for moderate pain. 06/03/13  Yes Ripudeep Krystal Eaton, MD  ondansetron (ZOFRAN) 4 MG tablet Take 1 tablet (4 mg total) by mouth every 8 (eight) hours as needed for nausea or vomiting. 08/20/13   Janice Norrie, MD  ponatinib HCl (ICLUSIG) 15 MG tablet Take 15 mg by mouth daily. 05/06/13   Historical Provider, MD   BP 111/69  Pulse 110  Temp(Src) 99.1 F (37.3 C) (Oral)  Resp 16  SpO2 99%  Vital signs normal except for a tachycardia and low-grade top  Physical Exam  Nursing note and vitals reviewed. Constitutional: She is oriented to  person, place, and time. She appears well-developed and well-nourished.  Non-toxic appearance. She does not appear ill. No distress.  HENT:  Head: Normocephalic and atraumatic.  Right Ear: External ear normal.  Left Ear: External ear normal.  Nose: Nose normal. No mucosal edema or rhinorrhea.  Mouth/Throat: Oropharynx is clear and moist and mucous membranes are normal. No dental abscesses or uvula swelling.  Eyes: Conjunctivae and EOM are normal. Pupils are equal, round, and reactive to light.  Neck: Normal range of motion and full passive range of motion without pain. Neck supple.  Cardiovascular:  Normal rate, regular rhythm and normal heart sounds.  Exam reveals no gallop and no friction rub.   No murmur heard. Pulmonary/Chest: Effort normal and breath sounds normal. No respiratory distress. She has no wheezes. She has no rhonchi. She has no rales. She exhibits no tenderness and no crepitus.  Abdominal: Soft. Normal appearance and bowel sounds are normal. She exhibits no distension. There is no tenderness. There is no rebound and no guarding.  Musculoskeletal: Normal range of motion. She exhibits no edema and no tenderness.  Moves all extremities well.   Neurological: She is alert and oriented to person, place, and time. She has normal strength. No cranial nerve deficit.  Skin: Skin is warm, dry and intact. No rash noted. No erythema. No pallor.  Psychiatric: She has a normal mood and affect. Her speech is normal and behavior is normal. Her mood appears not anxious.    ED Course  Procedures (including critical care time)  Medications  0.9 %  sodium chloride infusion (0 mLs Intravenous Stopped 08/20/13 1245)    Followed by  0.9 %  sodium chloride infusion (0 mLs Intravenous Stopped 08/20/13 1135)    Followed by  0.9 %  sodium chloride infusion (1,000 mLs Intravenous New Bag/Given 08/20/13 1245)  prochlorperazine (COMPAZINE) injection 10 mg (10 mg Intravenous Given 08/20/13 1529)   metoCLOPramide (REGLAN) injection 10 mg (10 mg Intravenous Given 08/20/13 1045)  diphenhydrAMINE (BENADRYL) injection 25 mg (25 mg Intravenous Given 08/20/13 1045)  sodium chloride 0.9 % bolus 1,000 mL (0 mLs Intravenous Stopped 08/20/13 1513)  ketorolac (TORADOL) 30 MG/ML injection 30 mg (30 mg Intravenous Given 08/20/13 1431)  magnesium sulfate IVPB 2 g 50 mL (0 g Intravenous Stopped 08/20/13 1554)  diphenhydrAMINE (BENADRYL) injection 25 mg (25 mg Intravenous Given 08/20/13 1529)   Patient had no diarrhea during her ED visit which is over 7 hours. She had no vomiting. After getting her IV fluids her heart rate improved. Her lactic acidosis improved. Her main complaint after getting hydrated seemed to be her headache which she states is like her migraine headaches although she states it is not as bad. Despite saying that headache was not throbbing it 1500 she did state that headache was throbbing in nature. She was given more migraine-type medications. She states Imitrex does not help her headaches. By discharge she states her headache was almost gone. She felt ready to be discharged. We discussed getting a stool sample to be checked for C. Difficile.  Patient denies any urinary symptoms such as dysuria or frequency. Urine culture was sent. At this time I am not going to wait to treat for possible UTI. There may be some contamination from her diarrhea.   Labs Review Results for orders placed during the hospital encounter of 08/20/13  CBC WITH DIFFERENTIAL      Result Value Ref Range   WBC 6.6  4.0 - 10.5 K/uL   RBC 3.12 (*) 3.87 - 5.11 MIL/uL   Hemoglobin 10.0 (*) 12.0 - 15.0 g/dL   HCT 29.7 (*) 36.0 - 46.0 %   MCV 95.2  78.0 - 100.0 fL   MCH 32.1  26.0 - 34.0 pg   MCHC 33.7  30.0 - 36.0 g/dL   RDW 19.2 (*) 11.5 - 15.5 %   Platelets 177  150 - 400 K/uL   Neutrophils Relative % 78 (*) 43 - 77 %   Neutro Abs 5.2  1.7 - 7.7 K/uL   Lymphocytes Relative 15  12 - 46 %  Lymphs Abs 1.0  0.7 - 4.0  K/uL   Monocytes Relative 6  3 - 12 %   Monocytes Absolute 0.4  0.1 - 1.0 K/uL   Eosinophils Relative 1  0 - 5 %   Eosinophils Absolute 0.1  0.0 - 0.7 K/uL   Basophils Relative 0  0 - 1 %   Basophils Absolute 0.0  0.0 - 0.1 K/uL  COMPREHENSIVE METABOLIC PANEL      Result Value Ref Range   Sodium 138  137 - 147 mEq/L   Potassium 3.8  3.7 - 5.3 mEq/L   Chloride 101  96 - 112 mEq/L   CO2 20  19 - 32 mEq/L   Glucose, Bld 300 (*) 70 - 99 mg/dL   BUN 20  6 - 23 mg/dL   Creatinine, Ser 1.29 (*) 0.50 - 1.10 mg/dL   Calcium 8.9  8.4 - 10.5 mg/dL   Total Protein 5.7 (*) 6.0 - 8.3 g/dL   Albumin 3.0 (*) 3.5 - 5.2 g/dL   AST 60 (*) 0 - 37 U/L   ALT 102 (*) 0 - 35 U/L   Alkaline Phosphatase 150 (*) 39 - 117 U/L   Total Bilirubin 0.2 (*) 0.3 - 1.2 mg/dL   GFR calc non Af Amer 48 (*) >90 mL/min   GFR calc Af Amer 56 (*) >90 mL/min   Anion gap 17 (*) 5 - 15  URINALYSIS, ROUTINE W REFLEX MICROSCOPIC      Result Value Ref Range   Color, Urine YELLOW  YELLOW   APPearance CLOUDY (*) CLEAR   Specific Gravity, Urine 1.023  1.005 - 1.030   pH 5.0  5.0 - 8.0   Glucose, UA >1000 (*) NEGATIVE mg/dL   Hgb urine dipstick TRACE (*) NEGATIVE   Bilirubin Urine NEGATIVE  NEGATIVE   Ketones, ur NEGATIVE  NEGATIVE mg/dL   Protein, ur 30 (*) NEGATIVE mg/dL   Urobilinogen, UA 0.2  0.0 - 1.0 mg/dL   Nitrite NEGATIVE  NEGATIVE   Leukocytes, UA MODERATE (*) NEGATIVE  URINE MICROSCOPIC-ADD ON      Result Value Ref Range   Squamous Epithelial / LPF RARE  RARE   WBC, UA 21-50  <3 WBC/hpf   RBC / HPF 0-2  <3 RBC/hpf   Bacteria, UA FEW (*) RARE   Casts HYALINE CASTS (*) NEGATIVE  I-STAT CG4 LACTIC ACID, ED      Result Value Ref Range   Lactic Acid, Venous 4.57 (*) 0.5 - 2.2 mmol/L  CBG MONITORING, ED      Result Value Ref Range   Glucose-Capillary 234 (*) 70 - 99 mg/dL  I-STAT CG4 LACTIC ACID, ED      Result Value Ref Range   Lactic Acid, Venous 1.37  0.5 - 2.2 mmol/L   Laboratory interpretation all  normal except elevated LA, stable anemia, ? uti      Imaging Review Dg Chest 2 View  08/20/2013   CLINICAL DATA:  Cough, shortness of breath, and fever; history of leukemia.  EXAM: CHEST  2 VIEW  COMPARISON:  PA and lateral chest x-ray of Jun 09, 2013  FINDINGS: The lungs are mildly hypoinflated. There are new increased interstitial densities present peripherally in the left mid and upper lung and also of the left lung base and in the right infrahilar region. There is no alveolar infiltrate. The heart and pulmonary vascularity are normal. There is no pleural effusion. A power port type appliance is present with its tip in  the midportion of the SVC.  IMPRESSION: Bilateral hypo inflation with subsegmental atelectasis or early interstitial pneumonia in the left mid and lower lung and in the right infrahilar region.   Electronically Signed   By: David  Martinique   On: 08/20/2013 11:40     EKG Interpretation   Date/Time:  Wednesday August 20 2013 09:46:20 EDT Ventricular Rate:  121 PR Interval:  112 QRS Duration: 76 QT Interval:  318 QTC Calculation: 451 R Axis:   34 Text Interpretation:  Sinus tachycardia Low voltage, precordial leads  Borderline T abnormalities, anterior leads Baseline wander in lead(s) V1  V6 No significant change since last tracing 09 Jun 2013 Confirmed by [facility]   MD-I,  ([id number]) on 08/20/2013 11:02:05 AM      MDM   Final diagnoses:  Nausea vomiting and diarrhea  Dehydration  Tachycardia  Nonintractable migraine, unspecified migraine type  Lactic acidosis    New Prescriptions   ONDANSETRON (ZOFRAN) 4 MG TABLET    Take 1 tablet (4 mg total) by mouth every 8 (eight) hours as needed for nausea or vomiting.    Plan discharge  Rolland Porter, MD, Alanson Aly, MD 08/20/13 [phone]

## 2013-08-21 ENCOUNTER — Ambulatory Visit: Payer: Medicaid Other | Admitting: Nurse Practitioner

## 2013-08-22 LAB — URINE CULTURE

## 2013-08-23 ENCOUNTER — Encounter (HOSPITAL_COMMUNITY): Payer: Self-pay | Admitting: Emergency Medicine

## 2013-08-23 ENCOUNTER — Emergency Department (HOSPITAL_COMMUNITY)
Admission: EM | Admit: 2013-08-23 | Discharge: 2013-08-23 | Disposition: A | Discharge status: Home / Self Care | Payer: Medicaid Other | Attending: Emergency Medicine | Admitting: Emergency Medicine

## 2013-08-23 DIAGNOSIS — Z794 Long term (current) use of insulin: Secondary | ICD-10-CM | POA: Diagnosis not present

## 2013-08-23 DIAGNOSIS — IMO0002 Reserved for concepts with insufficient information to code with codable children: Secondary | ICD-10-CM | POA: Diagnosis not present

## 2013-08-23 DIAGNOSIS — R197 Diarrhea, unspecified: Secondary | ICD-10-CM | POA: Diagnosis present

## 2013-08-23 DIAGNOSIS — A0472 Enterocolitis due to Clostridium difficile, not specified as recurrent: Secondary | ICD-10-CM

## 2013-08-23 DIAGNOSIS — E119 Type 2 diabetes mellitus without complications: Secondary | ICD-10-CM | POA: Diagnosis not present

## 2013-08-23 DIAGNOSIS — Z8739 Personal history of other diseases of the musculoskeletal system and connective tissue: Secondary | ICD-10-CM | POA: Insufficient documentation

## 2013-08-23 DIAGNOSIS — I10 Essential (primary) hypertension: Secondary | ICD-10-CM | POA: Insufficient documentation

## 2013-08-23 DIAGNOSIS — R109 Unspecified abdominal pain: Secondary | ICD-10-CM | POA: Insufficient documentation

## 2013-08-23 DIAGNOSIS — Z86711 Personal history of pulmonary embolism: Secondary | ICD-10-CM | POA: Insufficient documentation

## 2013-08-23 DIAGNOSIS — Z79899 Other long term (current) drug therapy: Secondary | ICD-10-CM | POA: Insufficient documentation

## 2013-08-23 DIAGNOSIS — Z862 Personal history of diseases of the blood and blood-forming organs and certain disorders involving the immune mechanism: Secondary | ICD-10-CM | POA: Insufficient documentation

## 2013-08-23 LAB — CBC WITH DIFFERENTIAL/PLATELET
Basophils Absolute: 0 10*3/uL (ref 0.0–0.1)
Basophils Relative: 0 % (ref 0–1)
EOS PCT: 1 % (ref 0–5)
Eosinophils Absolute: 0.1 10*3/uL (ref 0.0–0.7)
HEMATOCRIT: 31.6 % — AB (ref 36.0–46.0)
Hemoglobin: 10.3 g/dL — ABNORMAL LOW (ref 12.0–15.0)
Lymphocytes Relative: 17 % (ref 12–46)
Lymphs Abs: 1.5 10*3/uL (ref 0.7–4.0)
MCH: 31.5 pg (ref 26.0–34.0)
MCHC: 32.6 g/dL (ref 30.0–36.0)
MCV: 96.6 fL (ref 78.0–100.0)
MONO ABS: 0.8 10*3/uL (ref 0.1–1.0)
Monocytes Relative: 9 % (ref 3–12)
Neutro Abs: 6.4 10*3/uL (ref 1.7–7.7)
Neutrophils Relative %: 73 % (ref 43–77)
Platelets: 197 10*3/uL (ref 150–400)
RBC: 3.27 MIL/uL — ABNORMAL LOW (ref 3.87–5.11)
RDW: 19.4 % — AB (ref 11.5–15.5)
WBC: 8.8 10*3/uL (ref 4.0–10.5)

## 2013-08-23 LAB — COMPREHENSIVE METABOLIC PANEL
ALBUMIN: 3.3 g/dL — AB (ref 3.5–5.2)
ALT: 90 U/L — ABNORMAL HIGH (ref 0–35)
AST: 47 U/L — AB (ref 0–37)
Alkaline Phosphatase: 172 U/L — ABNORMAL HIGH (ref 39–117)
Anion gap: 14 (ref 5–15)
BUN: 11 mg/dL (ref 6–23)
CALCIUM: 9.2 mg/dL (ref 8.4–10.5)
CO2: 23 mEq/L (ref 19–32)
CREATININE: 0.89 mg/dL (ref 0.50–1.10)
Chloride: 107 mEq/L (ref 96–112)
GFR calc Af Amer: 87 mL/min — ABNORMAL LOW (ref >90)
GFR, EST NON AFRICAN AMERICAN: 75 mL/min — AB (ref >90)
Glucose, Bld: 135 mg/dL — ABNORMAL HIGH (ref 70–99)
Potassium: 3.7 mEq/L (ref 3.7–5.3)
Sodium: 144 mEq/L (ref 137–147)
Total Bilirubin: <0.2 mg/dL — ABNORMAL LOW (ref 0.3–1.2)
Total Protein: 6.1 g/dL (ref 6.0–8.3)

## 2013-08-23 LAB — LIPASE, BLOOD: LIPASE: 30 U/L (ref 11–59)

## 2013-08-23 MED ORDER — METRONIDAZOLE 500 MG PO TABS: 500.0000 mg | ORAL_TABLET | Freq: Three times a day (TID) | ORAL | Status: DC

## 2013-08-23 MED ORDER — SODIUM CHLORIDE 0.9 % IV BOLUS (SEPSIS)
2000.0000 mL | Freq: Once | INTRAVENOUS | Status: AC
  Administered 2013-08-23: 2000 mL via INTRAVENOUS

## 2013-08-23 MED ORDER — METRONIDAZOLE 500 MG PO TABS
500.0000 mg | ORAL_TABLET | Freq: Once | ORAL | Status: AC
  Administered 2013-08-23: 500 mg via ORAL
  Filled 2013-08-23: qty 1

## 2013-08-23 MED ORDER — DICYCLOMINE HCL 20 MG PO TABS: 20.0000 mg | ORAL_TABLET | Freq: Two times a day (BID) | ORAL | Status: DC

## 2013-08-23 MED ORDER — ONDANSETRON HCL 4 MG/2ML IJ SOLN
4.0000 mg | Freq: Once | INTRAMUSCULAR | Status: AC
  Administered 2013-08-23: 4 mg via INTRAVENOUS
  Filled 2013-08-23: qty 2

## 2013-08-23 MED ORDER — SODIUM CHLORIDE 0.9 % IJ SOLN: 10.0000 mL | Freq: Two times a day (BID) | INTRAMUSCULAR | Status: DC

## 2013-08-23 MED ORDER — GLYCOPYRROLATE 0.2 MG/ML IJ SOLN
0.2000 mg | Freq: Once | INTRAMUSCULAR | Status: AC
  Administered 2013-08-23: 0.2 mg via INTRAVENOUS

## 2013-08-23 MED ORDER — HEPARIN SOD (PORK) LOCK FLUSH 100 UNIT/ML IV SOLN
500.0000 [IU] | INTRAVENOUS | Status: AC | PRN
  Administered 2013-08-23: 500 [IU]

## 2013-08-23 MED ORDER — GLYCOPYRROLATE 0.2 MG/ML IJ SOLN
0.1000 mg | Freq: Once | INTRAMUSCULAR | Status: DC
  Filled 2013-08-23: qty 1

## 2013-08-23 MED ORDER — ONDANSETRON 4 MG PO TBDP: 4.0000 mg | ORAL_TABLET | Freq: Three times a day (TID) | ORAL | Status: DC | PRN

## 2013-08-23 MED ORDER — SODIUM CHLORIDE 0.9 % IJ SOLN
10.0000 mL | INTRAMUSCULAR | Status: DC | PRN
Start: 2013-08-23 — End: 2013-08-24
  Administered 2013-08-23: 10 mL

## 2013-08-23 MED ORDER — DIPHENOXYLATE-ATROPINE 2.5-0.025 MG PO TABS: 1.0000 | ORAL_TABLET | Freq: Four times a day (QID) | ORAL | Status: DC | PRN

## 2013-08-23 NOTE — ED Notes (Signed)
Per iv team, ok to use port right chest

## 2013-08-23 NOTE — ED Notes (Addendum)
Pt reports going to [facility] yesterday for check up and told them about diarrhea x 1 month, pt tested + for c diff. Unable to get antibiotics till Monday and having increase in abd cramping. Also had port accessed yesterday at [facility] and they forgot to deaccess it, right chest.

## 2013-08-23 NOTE — ED Notes (Signed)
Pt is difficulty stick, has port right chest and wants labs drawn from port.

## 2013-08-23 NOTE — Discharge Instructions (Signed)
Clostridium Difficile Infection °Clostridium difficile (C. difficile) is a germ found in the intestines. C. difficile infection can occur after taking some medicines. C. difficile infection can cause watery poop (diarrhea) or severe disease. °HOME CARE °· Drink enough fluids to keep your pee (urine) clear or pale yellow. Avoid milk, caffeine, and alcohol. °· Ask your doctor how to replace body fluid losses (rehydrate). °· Eat small meals more often rather than large meals. °· Take your medicine (antibiotics) as told. Finish it even if you start to feel better. °· Do not  use medicines to slow the watery poop. °· Wash your hands well after using the bathroom and before preparing food. °· Make sure people who live with you wash their hands often. °· Clean all surfaces. Use a product that contains chlorine bleach. °GET HELP RIGHT AWAY IF:  °· The watery poop does not stop, or it comes back after you finish your medicine. °· You feel very dry or thirsty (dehydrated). °· You have a fever. °· You have more belly (abdominal) pain or tenderness. °· There is blood in your poop (stool), or your poop is black and tar-like. °· You cannot eat food or drink liquids without throwing up (vomiting). °MAKE SURE YOU: °· Understand these instructions. °· Will watch your condition. °· Will get help right away if you are not doing well or get worse. °Document Released: 11/13/2008 Document Revised: 06/02/2013 Document Reviewed: 06/24/2010 °[employer]® Patient Information ©2015 [employer]. This information is not intended to replace advice given to you by your health care provider. Make sure you discuss any questions you have with your health care provider. ° °

## 2013-08-23 NOTE — ED Notes (Signed)
Paging iv team about accessing pts port

## 2013-08-23 NOTE — ED Notes (Signed)
IV team notified of patients port needs de-accessing.

## 2013-08-25 ENCOUNTER — Telehealth: Payer: Self-pay | Admitting: Internal Medicine

## 2013-08-25 ENCOUNTER — Other Ambulatory Visit: Payer: Self-pay | Admitting: *Deleted

## 2013-08-25 ENCOUNTER — Telehealth: Payer: Self-pay | Admitting: *Deleted

## 2013-08-25 DIAGNOSIS — C9100 Acute lymphoblastic leukemia not having achieved remission: Secondary | ICD-10-CM

## 2013-08-25 NOTE — Telephone Encounter (Signed)
Spoke with pt and informed pt re: a scheduler will contact pt with appt date and time with Dr. Juliann Mule on  08/27/13.  Pt stated she had missed last office visit due to pt was admitted to [facility] stated she went to ER yesterday and was tested positive for  C. Diff.   Pt stated she had contacted social worker Polo Riley for medication assistance.

## 2013-08-25 NOTE — Telephone Encounter (Signed)
s.w. pt and advised on July appt....pt ok and aware °

## 2013-08-26 ENCOUNTER — Encounter: Payer: Self-pay | Admitting: Internal Medicine

## 2013-08-26 LAB — CULTURE, BLOOD (ROUTINE X 2)
CULTURE: NO GROWTH
Culture: NO GROWTH

## 2013-08-26 NOTE — Progress Notes (Signed)
Patient said she has to meet deductible so medicaid on hold. I advised her to make sure she gets all bills for case worker so she can get active again. She will bring in proof of her income to me on Wed for possible asst with meds from dr Juliann Mule. I also gave her Rx asst for all her other meds. I advised could help with any we wrote for her only.

## 2013-08-27 ENCOUNTER — Other Ambulatory Visit: Payer: Self-pay

## 2013-08-27 ENCOUNTER — Ambulatory Visit (HOSPITAL_BASED_OUTPATIENT_CLINIC_OR_DEPARTMENT_OTHER): Payer: Medicaid Other | Admitting: Internal Medicine

## 2013-08-27 ENCOUNTER — Other Ambulatory Visit (HOSPITAL_BASED_OUTPATIENT_CLINIC_OR_DEPARTMENT_OTHER): Payer: Medicaid Other

## 2013-08-27 ENCOUNTER — Encounter: Payer: Self-pay | Admitting: Internal Medicine

## 2013-08-27 ENCOUNTER — Telehealth: Payer: Self-pay | Admitting: Internal Medicine

## 2013-08-27 ENCOUNTER — Non-Acute Institutional Stay (HOSPITAL_COMMUNITY): Admission: AD | Admit: 2013-08-27 | Payer: Medicaid Other | Source: Ambulatory Visit | Admitting: Internal Medicine

## 2013-08-27 VITALS — BP 130/90 | HR 124 | Temp 98.6°F | Resp 20 | Ht 67.0 in | Wt 228.2 lb

## 2013-08-27 DIAGNOSIS — C9100 Acute lymphoblastic leukemia not having achieved remission: Secondary | ICD-10-CM

## 2013-08-27 DIAGNOSIS — E1165 Type 2 diabetes mellitus with hyperglycemia: Secondary | ICD-10-CM

## 2013-08-27 DIAGNOSIS — Z86711 Personal history of pulmonary embolism: Secondary | ICD-10-CM

## 2013-08-27 DIAGNOSIS — J84116 Cryptogenic organizing pneumonia: Secondary | ICD-10-CM

## 2013-08-27 DIAGNOSIS — C9102 Acute lymphoblastic leukemia, in relapse: Secondary | ICD-10-CM

## 2013-08-27 DIAGNOSIS — R209 Unspecified disturbances of skin sensation: Secondary | ICD-10-CM

## 2013-08-27 DIAGNOSIS — IMO0001 Reserved for inherently not codable concepts without codable children: Secondary | ICD-10-CM

## 2013-08-27 DIAGNOSIS — E860 Dehydration: Secondary | ICD-10-CM

## 2013-08-27 DIAGNOSIS — I2699 Other pulmonary embolism without acute cor pulmonale: Secondary | ICD-10-CM

## 2013-08-27 DIAGNOSIS — I10 Essential (primary) hypertension: Secondary | ICD-10-CM

## 2013-08-27 DIAGNOSIS — Z95828 Presence of other vascular implants and grafts: Secondary | ICD-10-CM

## 2013-08-27 DIAGNOSIS — A0472 Enterocolitis due to Clostridium difficile, not specified as recurrent: Secondary | ICD-10-CM

## 2013-08-27 DIAGNOSIS — K219 Gastro-esophageal reflux disease without esophagitis: Secondary | ICD-10-CM

## 2013-08-27 DIAGNOSIS — R51 Headache: Secondary | ICD-10-CM

## 2013-08-27 DIAGNOSIS — Z9484 Stem cells transplant status: Secondary | ICD-10-CM

## 2013-08-27 LAB — CBC WITH DIFFERENTIAL/PLATELET
BASO%: 0.7 % (ref 0.0–2.0)
Basophils Absolute: 0 10*3/uL (ref 0.0–0.1)
EOS%: 1.3 % (ref 0.0–7.0)
Eosinophils Absolute: 0.1 10*3/uL (ref 0.0–0.5)
HCT: 30.3 % — ABNORMAL LOW (ref 34.8–46.6)
HGB: 10 g/dL — ABNORMAL LOW (ref 11.6–15.9)
LYMPH%: 27.3 % (ref 14.0–49.7)
MCH: 31.8 pg (ref 25.1–34.0)
MCHC: 33 g/dL (ref 31.5–36.0)
MCV: 96.6 fL (ref 79.5–101.0)
MONO#: 0.8 10*3/uL (ref 0.1–0.9)
MONO%: 13.2 % (ref 0.0–14.0)
NEUT%: 57.5 % (ref 38.4–76.8)
NEUTROS ABS: 3.4 10*3/uL (ref 1.5–6.5)
Platelets: 199 10*3/uL (ref 145–400)
RBC: 3.13 10*6/uL — AB (ref 3.70–5.45)
RDW: 21 % — ABNORMAL HIGH (ref 11.2–14.5)
WBC: 5.9 10*3/uL (ref 3.9–10.3)
lymph#: 1.6 10*3/uL (ref 0.9–3.3)

## 2013-08-27 LAB — COMPREHENSIVE METABOLIC PANEL (CC13)
ALT: 46 U/L (ref 0–55)
ANION GAP: 11 meq/L (ref 3–11)
AST: 34 U/L (ref 5–34)
Albumin: 3.1 g/dL — ABNORMAL LOW (ref 3.5–5.0)
Alkaline Phosphatase: 133 U/L (ref 40–150)
BUN: 9.2 mg/dL (ref 7.0–26.0)
CALCIUM: 9 mg/dL (ref 8.4–10.4)
CHLORIDE: 109 meq/L (ref 98–109)
CO2: 23 meq/L (ref 22–29)
Creatinine: 1 mg/dL (ref 0.6–1.1)
Glucose: 199 mg/dl — ABNORMAL HIGH (ref 70–140)
Potassium: 3.5 mEq/L (ref 3.5–5.1)
SODIUM: 143 meq/L (ref 136–145)
TOTAL PROTEIN: 5.6 g/dL — AB (ref 6.4–8.3)
Total Bilirubin: 0.27 mg/dL (ref 0.20–1.20)

## 2013-08-27 MED ORDER — SULFAMETHOXAZOLE-TMP DS 800-160 MG PO TABS: 1.0000 | ORAL_TABLET | ORAL | Status: DC

## 2013-08-27 MED ORDER — METRONIDAZOLE 500 MG PO TABS: 500.0000 mg | ORAL_TABLET | Freq: Three times a day (TID) | ORAL | Status: DC

## 2013-08-27 MED ORDER — INSULIN ASPART 100 UNIT/ML FLEXPEN: 5.0000 [IU] | PEN_INJECTOR | Freq: Three times a day (TID) | SUBCUTANEOUS | Status: DC

## 2013-08-27 MED ORDER — INSULIN GLARGINE 100 UNIT/ML SOLOSTAR PEN: 10.0000 [IU] | PEN_INJECTOR | Freq: Every morning | SUBCUTANEOUS | Status: DC

## 2013-08-27 MED ORDER — ACYCLOVIR 800 MG PO TABS: 800.0000 mg | ORAL_TABLET | Freq: Two times a day (BID) | ORAL | Status: DC

## 2013-08-27 NOTE — Progress Notes (Signed)
[place], [place] [address] [place] [address]  DIAGNOSIS: Leukemia, acute lymphoid - Plan: CBC with Differential, Comprehensive metabolic panel (Cmet) - [facility], Lactate dehydrogenase (LDH) - [facility]  Greenfield filter in place  Clostridium difficile colitis  Cryptogenic organizing pneumonia  Dehydration  Acute pulmonary embolism  Type II or unspecified type diabetes mellitus without mention of complication, uncontrolled  History of peripheral stem cell transplant  History of pulmonary embolism  Chief Complaint  Patient presents with  . Acute lymphoid leukemia   CURRENT THERAPY:   Dasatinib 50 mg daily, which was started on 05/31/2012. The patient had been on Dasatinib 100 mg daily at least since 12/15/2011. The dose was reduced because of neutropenia. The patient also received dasatinib during induction treatment which dates back to May 2013.  She had relapse on Sept 22 and was hospitalized until Oct 31.  She underwent Allogeneic SCT (9/10 match with mismatch at DQ) on January 8th at [facility] (Dr. Harvel Ricks).  INTERVAL HISTORY: Lynn [name] 48 y.o. 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  She was last seen by me on 04/09/2013.  She is being managed by [facility].  She underwent Allogeneic stem cell trasnplant on 02/06/2013.  She was admitted on 01/28/2013 for prepartive regimen of Busulfan/CTX.  Her post transplant course was complicated by c. Difficile positive diarrhea treated with oral vancomycin, pneumonia likely bacterial.  CXR with new patchy opacities treated with zosyn and moxifloxacin and acute kidney injury secondary to dehydration from diarrhea and ATN which improved with hydration.  Her WBC engrafted on 02/24/2013 and platelets on 02/20/2013.    She is day +112 post transplant today.  She last saw Dr. Harvel Ricks on yesterday.  Transplant wise, she reports things are going well.   She started a cough last weekend and random fever of 100.4 with increased shortness of breath with exertion and nausea.   Her glucose monitor also ran high.  They referred her to an endocrinologist center at [facility] but she prefers a local endocrinologist.  She had a CT of sinuses and Chest which revealed PNA on the right.  She is on antibiotics including levaquin 750 mg daily for fourteen days.  She on day #2 of 14.  She denies any changes in her symptoms.  She did have a temperature of 99.8 yesterday.  She has been off steroids for some time. She will see her PCP of [facility] in the next few days for a referral to endocrinology.  She also reports seeing a neurologist (Dr. Phil Dopp) for tingling in her thighs. She also reports elevated glucose of 400s and receiving insulin.   MEDICAL HISTORY: Past Medical History  Diagnosis Date  . Hypertension   . Brain bleed     07/04/11  . Pulmonary embolism     06/22/11  . Headache(784.0)   . Arthritis   . Anemia   . [name] (acute lymphoblastic leukemia)   . Leukemia   . Diabetes mellitus without complication     INTERIM HISTORY: has Microcytosis; Acute pulmonary embolism; Leukocytosis; Thrombocytopenia; Anemia; Hilar lymphadenopathy; Leukemia, acute lymphoid; Acute lymphoid leukemia; Acute lymphoid leukemia in remission; Transfusion reaction; Viral meningitis; Encounter for antineoplastic chemotherapy; Essential hypertension; History of peripheral stem cell transplant; Subdural hemorrhage; Neutropenia; Fever; Right thyroid nodule; Port-a-cath in place; History of pulmonary embolism; Greenfield filter in place; Pneumonia; DKA (diabetic ketoacidoses); CAP (community acquired pneumonia); Type II or  unspecified type diabetes mellitus without mention of complication, uncontrolled; Clostridium difficile colitis; Cryptogenic organizing pneumonia; and Dehydration on her problem list.    ALLERGIES:  is allergic to other.  MEDICATIONS: has a current medication list which includes  the following prescription(s): acyclovir, amitriptyline, dicyclomine, diphenoxylate-atropine, fluconazole, folic acid, insulin aspart, insulin glargine, magnesium chloride, metformin, metronidazole, multivitamin, ondansetron, ondansetron, ponatinib hcl, prednisone, prochlorperazine, sulfamethoxazole-trimethoprim, tacrolimus, and tramadol.  SURGICAL HISTORY:  Past Surgical History  Procedure Laterality Date  . Cesarean section    . Foot surgery    . Foot surgery    . Cesarean section    . Eye surgery    . Craniotomy    . Greenfield filter     PROBLEM LIST:  1. B-cell acute lymphoblastic leukemia, Philadelphia chromosome positive, t(9; 22) initially with 71% blasts seen on bone marrow carried out on Jun 19, 2011. Subsequent bone marrow at [facility] on 06/22/2011 showed 98% blasts and peripheral blood showed 71% blasts. The patient received induction treatment with dasatinib (Sprycel) and Decadron. Her hospital course was complicated by the development of a posterior fossa subdural hematoma requiring a suboccipital craniotomy and evacuation of the hematoma on 07/04/2011. The patient also had neutropenic fever with negative cultures covered with broad-spectrum antibiotics. She had some headaches, vaginal bleeding and transaminitis. Admission to [facility] was from 06/21/2011 through 07/17/2011. Bone marrow carried out on 07/06/2011 showed a hypocellular bone marrow with no evidence for [name]. The patient was enrolled on protocol CALGB 54492. That protocol consisted of treatment with dasatinib along with chemotherapy. A bone marrow carried out on 07/28/2011 showed pan hypoplasia with no evidence for acute leukemia. Cytogenetics were normal. FISH studies were negative for t(9; 22). PCR for small p190 in both the peripheral blood and bone marrow returned negative at 0.000. On 08/08/2011 the patient received high-dose methotrexate with  leucovorin rescue. She also received IV vincristine 2.0 mg and CNS prophylaxis with intrathecal methotrexate/hydrocortisone. CSF was negative. On 09/18/2011 bone marrow biopsy showed no evidence of [name]. PCR for BCR/ABL showed 0.00001 fusion events. It was felt that the patient had achieved a complete hematologic response. On 09/26/2011 mobilization therapy with cytarabine/etoposide was started The patient was admitted to the hospital from 11/14/2011 through 11/28/2011, at which time she received high-dose therapy and her autologous stem cell transplant. She received melphalan on days -2 and days -1. Day 0 was 11/16/2011. The patient also received G-CSF. She had severe mucositis and dysphagia on 11/20/2011 which required morphine by PCA. She had some fevers on 11/23/2011. She was treated with antibiotics for prophylaxis. She had some headaches, but those have subsequently resolved. Venous Dopplers of her lower extremities were done bilaterally because of some swelling of her legs. These were negative for blood clots. It will be recalled that the patient does have an inferior vena cava Greenfield filter that was placed on 07/07/2011 for her prior history of pulmonary emboli. As stated, the patient was discharged from [facility] on October 29th. Minette tells me that she was restarted on dasatinib 100 mg daily in mid November. Records from [facility] indicate that bone marrows carried out on 10/16/2011, 11/02/2011, 12/15/2011, 03/08/2012 and 05/31/2012 were negative for any signs of leukemia.  2. Pulmonary emboli involving the right upper and right lower lobes with positive CT chest angiogram on 06/17/2011 and negative Dopplers.  3. Development of subdural hematoma involving the posterior fossa when the patient  was hospitalized at [facility], status post suboccipital craniotomy and evacuation of hematoma on  07/04/2011.  4. History of hypertension.  5. Morbid obesity.  6. Arthritis involving the right hip.  7. History of palpitations.  8. History of migraine headaches.  9. Placement of inferior vena cava Greenfield filter on 07/07/2011.  10. Mild renal insufficiency.  11. Possible 1.1 cm, hypodense lesion in the right thyroid lobe noted on CT angiogram of the chest from 05/13/2012.  12. Right-sided double lumen Port-A-Cath placed at [facility] on 07/12/2011.   REVIEW OF SYSTEMS:   Constitutional: Denies fevers, chills or abnormal weight loss; reports some periods of shakiness due to low blood glucose. Eyes: Denies blurriness of vision Ears, nose, mouth, throat, and face: Denies mucositis or sore throat Respiratory: Denies cough, dyspnea or wheezes Cardiovascular: Denies palpitation, chest discomfort or lower extremity swelling Gastrointestinal:  Denies nausea, heartburn or change in bowel habits Skin: Reports mild skin hypopigmentation since last transplant.  Lymphatics: Denies new lymphadenopathy or easy bruising Neurological:Denies numbness, tingling or new weaknesses Behavioral/Psych: Mood is stable, no new changes  [name] other systems were reviewed with the patient and are negative.  PHYSICAL EXAMINATION: ECOG PERFORMANCE STATUS: 0 - Asymptomatic  Blood pressure 130/90, pulse 124, temperature 98.6 F (37 C), temperature source Oral, resp. rate 20, height '5\' 7"'  (1.702 m), weight 228 lb 3.2 oz (103.511 kg).  GENERAL:alert, no distress and comfortable; chronically ill appearing woman with alopecia; moderately obese SKIN: skin color, texture, turgor are normal, no rashes or significant lesions EYES: normal, Conjunctiva are pink and non-injected, sclera clear OROPHARYNX:no exudate, no erythema and lips, buccal mucosa, and tongue with dark pigmentation.  NECK: supple, thyroid normal size, non-tender, without nodularity LYMPH:  no palpable lymphadenopathy  in the cervical, axillary or supraclavicular LUNGS: clear to auscultation and percussion with normal breathing effort HEART: tachycardic with regular  rhythm and no murmurs and no lower extremity edema ABDOMEN:abdomen soft, non-tender and normal bowel sounds Musculoskeletal:no cyanosis of digits and no clubbing  NEURO: alert & oriented x 3 with fluent speech, no focal motor/sensory deficits  LABORATORY DATA: Results for orders placed in visit on 08/27/13 (from the past 48 hour(s))  CBC WITH DIFFERENTIAL     Status: Abnormal   Collection Time    08/27/13  8:52 AM      Result Value Ref Range   WBC 5.9  3.9 - 10.3 10e3/uL   NEUT# 3.4  1.5 - 6.5 10e3/uL   HGB 10.0 (*) 11.6 - 15.9 g/dL   HCT 30.3 (*) 34.8 - 46.6 %   Platelets 199  145 - 400 10e3/uL   MCV 96.6  79.5 - 101.0 fL   MCH 31.8  25.1 - 34.0 pg   MCHC 33.0  31.5 - 36.0 g/dL   RBC 3.13 (*) 3.70 - 5.45 10e6/uL   RDW 21.0 (*) 11.2 - 14.5 %   lymph# 1.6  0.9 - 3.3 10e3/uL   MONO# 0.8  0.1 - 0.9 10e3/uL   Eosinophils Absolute 0.1  0.0 - 0.5 10e3/uL   Basophils Absolute 0.0  0.0 - 0.1 10e3/uL   NEUT% 57.5  38.4 - 76.8 %   LYMPH% 27.3  14.0 - 49.7 %   MONO% 13.2  0.0 - 14.0 %   EOS% 1.3  0.0 - 7.0 %   BASO% 0.7  0.0 - 2.0 %  COMPREHENSIVE METABOLIC PANEL (VZ85)     Status: Abnormal   Collection Time  08/27/13  8:52 AM      Result Value Ref Range   Sodium 143  136 - 145 mEq/L   Potassium 3.5  3.5 - 5.1 mEq/L   Chloride 109  98 - 109 mEq/L   CO2 23  22 - 29 mEq/L   Glucose 199 (*) 70 - 140 mg/dl   BUN 9.2  7.0 - 26.0 mg/dL   Creatinine 1.0  0.6 - 1.1 mg/dL   Total Bilirubin 0.27  0.20 - 1.20 mg/dL   Alkaline Phosphatase 133  40 - 150 U/L   AST 34  5 - 34 U/L   ALT 46  0 - 55 U/L   Total Protein 5.6 (*) 6.4 - 8.3 g/dL   Albumin 3.1 (*) 3.5 - 5.0 g/dL   Calcium 9.0  8.4 - 10.4 mg/dL   Anion Gap 11  3 - 11 mEq/L     Labs:  Lab Results  Component Value Date   WBC 5.9 08/27/2013   HGB 10.0* 08/27/2013   HCT 30.3*  08/27/2013   MCV 96.6 08/27/2013   PLT 199 08/27/2013   NEUTROABS 3.4 08/27/2013      Chemistry      Component Value Date/Time   NA 143 08/27/2013 0852   NA 144 08/23/2013 1700   K 3.5 08/27/2013 0852   K 3.7 08/23/2013 1700   CL 107 08/23/2013 1700   CL 106 07/09/2012 0949   CO2 23 08/27/2013 0852   CO2 23 08/23/2013 1700   BUN 9.2 08/27/2013 0852   BUN 11 08/23/2013 1700   CREATININE 1.0 08/27/2013 0852   CREATININE 0.89 08/23/2013 1700      Component Value Date/Time   CALCIUM 9.0 08/27/2013 0852   CALCIUM 9.2 08/23/2013 1700   ALKPHOS 133 08/27/2013 0852   ALKPHOS 172* 08/23/2013 1700   AST 34 08/27/2013 0852   AST 47* 08/23/2013 1700   ALT 46 08/27/2013 0852   ALT 90* 08/23/2013 1700   BILITOT 0.27 08/27/2013 0852   BILITOT <0.2* 08/23/2013 1700     Basic Metabolic Panel:  Recent Labs Lab 08/20/13 1030 08/23/13 1700 08/27/13 0852  NA 138 144 143  K 3.8 3.7 3.5  CL 101 107  --   CO2 '20 23 23  ' GLUCOSE 300* 135* 199*  BUN 20 11 9.2  CREATININE 1.29* 0.89 1.0  CALCIUM 8.9 9.2 9.0   GFR Estimated Creatinine Clearance: 85.2 ml/min (by C-G formula based on Cr of 1). Liver Function Tests:  Recent Labs Lab 08/20/13 1030 08/23/13 1700 08/27/13 0852  AST 60* 47* 34  ALT 102* 90* 46  ALKPHOS 150* 172* 133  BILITOT 0.2* <0.2* 0.27  PROT 5.7* 6.1 5.6*  ALBUMIN 3.0* 3.3* 3.1*   CBC:  Recent Labs Lab 08/20/13 1030 08/23/13 1700 08/27/13 0852  WBC 6.6 8.8 5.9  NEUTROABS 5.2 6.4 3.4  HGB 10.0* 10.3* 10.0*  HCT 29.7* 31.6* 30.3*  MCV 95.2 96.6 96.6  PLT 177 197 199   Studies:  No results found.   RADIOGRAPHIC STUDIES: 1. CT angiogram of the chest on 06/17/2011 showed right-sided pulmonary emboli. There were mildly prominent mediastinal and right hilar lymph nodes.  2. CT-guided iliac bone aspiration and core biopsy were carried out on 06/19/2011.  3. CT of the head without IV contrast on 07/23/2011 showed that the patient was status post suboccipital craniotomy and  right frontal bur hole placement. Otherwise negative noncontrast CT appearance of the brain.  4. Chest x-ray, 2 view, from 10/08/2011  was negative.  5. Chest x-ray, 2 view, on 02/06/2012 was negative.  6. CT angiogram of the chest on 02/06/2012 showed no evidence of pulmonary embolism or any other acute abnormalities. Lungs were clear.  7. Chest x-ray, 2 view, from 03/18/2012, showed no active cardiopulmonary disease.  8. CT scan of the head without IV contrast on 03/18/2012 showed no acute findings. No evidence of intracranial hemorrhage. There was evidence of the previous sub occipital craniotomy and right frontal burhole.  9. 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 CT scan of the head without IV contrast on 04/25/2012 showed no acute intracranial abnormality.  11. Chest x-ray, 2 view, on 05/13/2012 showed no acute disease in the chest.  12. CT angiogram of the chest on 05/13/2012 showed no evidence of pulmonary embolism. There was minimal bibasilar atelectasis. There was a vague 1.1-cm hypodensity within the right thyroid lobe. Consider further evaluation with thyroid ultrasound. 13. Chest x-ray, 2 view, from 12/03/2012, demonstrated increased lung markings in the retrocardiac region on the left suggests subsegmental atelectasis or early infiltrate. There is no pleural effusion. No pulmonary parenchymal mass is demonstrated. There is no evidence of CHF.  ASSESSMENT: Lynn [name] 48 y.o. female with a history of Leukemia, acute lymphoid - Plan: CBC with Differential, Comprehensive metabolic panel (Cmet) - [facility], Lactate dehydrogenase (LDH) - [facility]  Greenfield filter in place  Clostridium difficile colitis  Cryptogenic organizing  pneumonia  Dehydration  Acute pulmonary embolism  Type II or unspecified type diabetes mellitus without mention of complication, uncontrolled  History of peripheral stem cell transplant  History of pulmonary embolism   PLAN:   1. C. Difficile colitis (08/20/2013).  --She was diagnosed with C. Difficile on 07/22 at [facility]. She reports difficulty with continuing her flagyl due to financial concerns.  Her diarrhea eased down from 8 stools daily to less than one with notable improvement in her abdominal discomfort and cramping.  She is seeing financial counselor to resume her flagyl today.  Her last treatment was on 08/23/2013 per patient.   2. Dehydration. --Likely secondary to #1.  We will facilitate receipt of intravenous saline prn.  She is tachycardic on exam today. She will continue oral hydration and we will set up for intravenous fluids as needed.   3. Cryptogenic organizing pneumonia.  --She had a VATs on 05/21 consistent with the above at [facility] per patient.  Started prednisone 60 mg daily now tapered to 10 mg daily.  She notes improvement in her baseline dyspnea and resolution of her cough.   4. [name], relapsed s/p allogeneic stem cell transplant. Day 0 was 02/06/2013.    Counts are stable.   --Patient has experienced relapsed disease as noted above.  She had her  2nd ASCT at [facility].  Her counts demonstrate recovery presently.    --She will continue on prophylaxis medications including acyclovir 800 mg bid, Bactrim DS on Mon, Wed, and Friday.  --She was instructed to contact us should she need any interval labs.  She will forward any neurological records and hematological records to our offices. --She has antiemetics ondansetron 8 mg prn and prochlorperazine 5 mg prn and promethazine 12.5 mg q 6 hours prn.   5. Chronic headaches plus leg tingling. --She is following with neurology.  Continue imitrex 100 mg prn.   6. GERD. --Continue pantoprazole 40 mg daily.   7. History  of pulmonary emboli involving the right upper and right lower lobes with positive CT chest angiogram on 06/17/2011 and negative Dopplers.  --Placement of inferior vena cava Greenfield filter on 07/07/2011.   8. Development of subdural hematoma involving the posterior fossa when the patient was hospitalized at [facility], status post suboccipital craniotomy and evacuation of hematoma on 07/04/2011.   9. History of hypertension.  -- Observing off medication.   10. Elevated liver enzymes, resolved.  --We will continue to trend and avoid hepatoxins.   11. DM2 --She will have a referral.   12. Follow-up --We will plan to see her again in 1 month at which time we will check her CBC, chemistries, LDH.  Previously she was treated according to protocol CALGB 43276.    [name] questions were answered. The patient knows to call the clinic with any problems, questions or concerns. We can certainly see the patient much sooner if necessary.  I spent 15 minutes counseling the patient face to face. The total time spent in the appointment was 25 minutes.    , , MD 08/27/2013 10:17 AM

## 2013-08-27 NOTE — Telephone Encounter (Signed)
gv adn rpinted appt sched and avs for pt for Aug °

## 2013-08-27 NOTE — Telephone Encounter (Signed)
Pt called stating she has financial grant of $400. The meds have to be ordered by [facility]. S/w Dr Juliann Mule and ordered flagyl, bactrim, acyclovir, novolog, and lantus. S/w [facility] outpatient pharmacy staff about the grant.

## 2013-08-27 NOTE — Patient Instructions (Signed)
Metronidazole tablets or capsules What is this medicine? METRONIDAZOLE (me troe NI da zole) is an antiinfective. It is used to treat certain kinds of bacterial and protozoal infections. It will not work for colds, flu, or other viral infections. This medicine may be used for other purposes; ask your health care provider or pharmacist if you have questions. COMMON BRAND NAME(S): Flagyl What should I tell my health care provider before I take this medicine? They need to know if you have any of these conditions: -anemia or other blood disorders -disease of the nervous system -fungal or yeast infection -if you drink alcohol containing drinks -liver disease -seizures -an unusual or allergic reaction to metronidazole, or other medicines, foods, dyes, or preservatives -pregnant or trying to get pregnant -breast-feeding How should I use this medicine? Take this medicine by mouth with a full glass of water. Follow the directions on the prescription label. Take your medicine at regular intervals. Do not take your medicine more often than directed. Take all of your medicine as directed even if you think you are better. Do not skip doses or stop your medicine early. Talk to your pediatrician regarding the use of this medicine in children. Special care may be needed. Overdosage: If you think you have taken too much of this medicine contact a poison control center or emergency room at once. NOTE: This medicine is only for you. Do not share this medicine with others. What if I miss a dose? If you miss a dose, take it as soon as you can. If it is almost time for your next dose, take only that dose. Do not take double or extra doses. What may interact with this medicine? Do not take this medicine with any of the following medications: -alcohol or any product that contains alcohol -amprenavir oral solution -cisapride -disulfiram -dofetilide -dronedarone -paclitaxel injection -pimozide -ritonavir oral  solution -sertraline oral solution -sulfamethoxazole-trimethoprim injection -thioridazine -ziprasidone This medicine may also interact with the following medications: -birth control pills -cimetidine -lithium -other medicines that prolong the QT interval (cause an abnormal heart rhythm) -phenobarbital -phenytoin -warfarin This list may not describe all possible interactions. Give your health care provider a list of all the medicines, herbs, non-prescription drugs, or dietary supplements you use. Also tell them if you smoke, drink alcohol, or use illegal drugs. Some items may interact with your medicine. What should I watch for while using this medicine? Tell your doctor or health care professional if your symptoms do not improve or if they get worse. You may get drowsy or dizzy. Do not drive, use machinery, or do anything that needs mental alertness until you know how this medicine affects you. Do not stand or sit up quickly, especially if you are an older patient. This reduces the risk of dizzy or fainting spells. Avoid alcoholic drinks while you are taking this medicine and for three days afterward. Alcohol may make you feel dizzy, sick, or flushed. If you are being treated for a sexually transmitted disease, avoid sexual contact until you have finished your treatment. Your sexual partner may also need treatment. What side effects may I notice from receiving this medicine? Side effects that you should report to your doctor or health care professional as soon as possible: -allergic reactions like skin rash or hives, swelling of the face, lips, or tongue -confusion, clumsiness -difficulty speaking -discolored or sore mouth -dizziness -fever, infection -numbness, tingling, pain or weakness in the hands or feet -trouble passing urine or change in the amount of   urine -redness, blistering, peeling or loosening of the skin, including inside the mouth -seizures -unusually weak or  tired -vaginal irritation, dryness, or discharge Side effects that usually do not require medical attention (report to your doctor or health care professional if they continue or are bothersome): -diarrhea -headache -irritability -metallic taste -nausea -stomach pain or cramps -trouble sleeping This list may not describe all possible side effects. Call your doctor for medical advice about side effects. You may report side effects to FDA at 1-800-FDA-1088. Where should I keep my medicine? Keep out of the reach of children. Store at room temperature below 25 degrees C (77 degrees F). Protect from light. Keep container tightly closed. Throw away any unused medicine after the expiration date. NOTE: This sheet is a summary. It may not cover all possible information. If you have questions about this medicine, talk to your doctor, pharmacist, or health care provider.  2015, [employer]. (2012-08-23 14:08:39) Clostridium Difficile Infection Clostridium difficile (C. difficile) is a bacteria found in the intestinal tract or colon. Under certain conditions, it causes diarrhea and sometimes severe disease. The severe form of the disease is known as pseudomembranous colitis (often called C. difficile colitis). This disease can damage the lining of the colon or cause the colon to become enlarged (toxic megacolon). CAUSES Your colon normally contains many different bacteria, including C. difficile. The balance of bacteria in your colon can change during illness. This is especially true when you take antibiotic medicine. Taking antibiotics may allow the C. difficile to grow, multiply excessively, and make a toxin that then causes illness. The elderly and people with certain medical conditions have a greater risk of getting C. difficile infections. SYMPTOMS  Watery diarrhea.  Fever.  Fatigue.  Loss of appetite.  Nausea.  Abdominal swelling, pain, or  tenderness.  Dehydration. DIAGNOSIS Your symptoms may make your caregiver suspect a C. difficile infection, especially if you have used antibiotics in the preceding weeks. However, there are only 2 ways to know for certain whether you have a C. difficile infection:  A lab test that finds the toxin in your stool.  The specific appearance of an abnormality (pseudomembrane) in your colon. This can only be seen by doing a sigmoidoscopy or colonoscopy. These procedures involve passing an instrument through your rectum to look at the inside of your colon. Your caregiver will help determine if these tests are necessary. TREATMENT  Most people are successfully treated with one of two specific antibiotics, usually given by mouth. Other antibiotics you are receiving are stopped if possible.  Intravenous (IV) fluids and correction of electrolyte imbalance may be necessary.  Rarely, surgery may be needed to remove the infected part of the intestines.  Careful hand washing by you and your caregivers is important to prevent the spread of infection. In the hospital, your caregivers may also put on gowns and gloves to prevent the spread of the C. difficile bacteria. Your room is also cleaned regularly with a solution containing bleach or a product that is known to kill C. difficile. HOME CARE INSTRUCTIONS  Drink enough fluids to keep your urine clear or pale yellow. Avoid milk, caffeine, and alcohol.  Ask your caregiver for specific rehydration instructions.  Try eating small, frequent meals rather than large meals.  Take your antibiotics as directed. Finish them even if you start to feel better.  Do not use medicines to slow diarrhea. This could delay healing or cause complications.  Wash your hands thoroughly after using the bathroom and  before preparing food.  Make sure people who live with you wash their hands often, too.  Carefully disinfect all surfaces with a product that contains chlorine  bleach. SEEK MEDICAL CARE IF:  Diarrhea persists longer than expected or recurs after completing your course of antibiotic treatment for the C. difficile infection.  You have trouble staying hydrated. SEEK IMMEDIATE MEDICAL CARE IF:  You develop a new fever.  You have increasing abdominal pain or tenderness.  There is blood in your stools, or your stools are dark black and tarry.  You cannot hold down food or liquids. MAKE SURE YOU:  Understand these instructions.  Will watch your condition.  Will get help right away if you are not doing well or get worse. Document Released: 10/26/2004 Document Revised: 06/02/2013 Document Reviewed: 06/24/2010 [facility] Patient Information 2015 [place], [place]. This information is not intended to replace advice given to you by your health care provider. Make sure you discuss any questions you have with your health care provider.

## 2013-08-29 ENCOUNTER — Other Ambulatory Visit: Payer: Self-pay

## 2013-08-29 DIAGNOSIS — Z9484 Stem cells transplant status: Secondary | ICD-10-CM

## 2013-08-29 MED ORDER — TACROLIMUS 1 MG PO CAPS: 2.0000 mg | ORAL_CAPSULE | Freq: Two times a day (BID) | ORAL | Status: DC

## 2013-08-29 NOTE — ED Provider Notes (Signed)
CSN: [id number]     Arrival date & time 08/23/13  1438 History   First MD Initiated Contact with Patient 08/23/13 1726     Chief Complaint  Patient presents with  . Diarrhea  . Abdominal Pain      HPI  Patient presents with diarrhea. Had testing performed yesterday at [facility] in her check up. Patient was called and she tested positive for C. difficile. Had a prescription called in for oral vancomycin. Pharmacies did not have it available. She presents here. Describes abdominal cramping and continued diarrhea. History of bone marrow transplant. Has a right chest port. No blood in her stool. Not lightheaded. Feels generally weak.  Past Medical History  Diagnosis Date  . Hypertension   . Brain bleed     07/04/11  . Pulmonary embolism     06/22/11  . Headache(784.0)   . Arthritis   . Anemia   . ALL (acute lymphoblastic leukemia)   . Leukemia   . Diabetes mellitus without complication    Past Surgical History  Procedure Laterality Date  . Cesarean section    . Foot surgery    . Foot surgery    . Cesarean section    . Eye surgery    . Craniotomy    . [place] filter     Family History  Problem Relation Age of Onset  . Pancreatic cancer Father   . Cancer Mother     colon  . Pancreatic cancer Mother   . Diabetes Neg Hx    History  Substance Use Topics  . Smoking status: Never Smoker   . Smokeless tobacco: Never Used  . Alcohol Use: No   OB History   Grav Para Term Preterm Abortions TAB SAB Ect Mult Living                 Review of Systems  Constitutional: Negative for fever, chills, diaphoresis, appetite change and fatigue.  HENT: Negative for mouth sores, sore throat and trouble swallowing.   Eyes: Negative for visual disturbance.  Respiratory: Negative for cough, chest tightness, shortness of breath and wheezing.   Cardiovascular: Negative for chest pain.  Gastrointestinal: Positive for diarrhea. Negative for nausea, vomiting, abdominal pain and abdominal  distention.  Endocrine: Negative for polydipsia, polyphagia and polyuria.  Genitourinary: Negative for dysuria, frequency and hematuria.  Musculoskeletal: Negative for gait problem.  Skin: Negative for color change, pallor and rash.  Neurological: Positive for weakness. Negative for dizziness, syncope, light-headedness and headaches.  Hematological: Does not bruise/bleed easily.  Psychiatric/Behavioral: Negative for behavioral problems and confusion.      Allergies  Other  Home Medications   Prior to Admission medications   Medication Sig Start Date End Date Taking? Authorizing Provider  amitriptyline (ELAVIL) 10 MG tablet Take 1 tablet (10 mg total) by mouth at bedtime. 12/13/12  Yes Philmore Pali, NP  fluconazole (DIFLUCAN) 200 MG tablet Take 400 mg by mouth daily.  02/24/13  Yes Historical Provider, MD  folic acid (FOLVITE) 1 MG tablet Take 1 mg by mouth daily.  03/13/13 03/13/14 Yes Historical Provider, MD  magnesium chloride (SLOW-MAG) 64 MG TBEC SR tablet Take 2 tablets by mouth 3 (three) times daily.  11/29/12  Yes Historical Provider, MD  metFORMIN (GLUCOPHAGE) 500 MG tablet Take 500 mg by mouth 2 (two) times daily with a meal.   Yes Historical Provider, MD  Multiple Vitamin (MULTIVITAMIN) tablet Take 1 tablet by mouth daily.   Yes Historical Provider, MD  ondansetron [facility])  4 MG tablet Take 1 tablet (4 mg total) by mouth every 8 (eight) hours as needed for nausea or vomiting. 08/20/13  Yes Janice Norrie, MD  predniSONE (DELTASONE) 10 MG tablet Take 10 mg by mouth daily with breakfast.   Yes Historical Provider, MD  prochlorperazine (COMPAZINE) 5 MG tablet Take 5 mg by mouth every 6 (six) hours as needed (nausea).  11/29/12  Yes Historical Provider, MD  tacrolimus (PROGRAF) 1 MG capsule Take 2 mg by mouth 2 (two) times daily.  03/04/13 03/04/14 Yes Historical Provider, MD  traMADol (ULTRAM) 50 MG tablet Take 50 mg by mouth every 6 (six) hours as needed for moderate pain. 06/03/13  Yes  Ripudeep Krystal Eaton, MD  acyclovir (ZOVIRAX) 800 MG tablet Take 1 tablet (800 mg total) by mouth 2 (two) times daily. . 08/27/13   Concha Norway, MD  dicyclomine (BENTYL) 20 MG tablet Take 1 tablet (20 mg total) by mouth 2 (two) times daily. 08/23/13   Tanna Furry, MD  diphenoxylate-atropine (LOMOTIL) 2.5-0.025 MG per tablet Take 1 tablet by mouth 4 (four) times daily as needed for diarrhea or loose stools. 08/23/13   Tanna Furry, MD  insulin aspart (NOVOLOG) 100 UNIT/ML FlexPen Inject 5 Units into the skin 3 (three) times daily with meals. (If you have eaten more than 50% of your meal.) 08/27/13   Concha Norway, MD  Insulin Glargine (LANTUS) 100 UNIT/ML Solostar Pen Inject 10-12 Units into the skin every morning. Sliding scale. 08/27/13   Concha Norway, MD  metroNIDAZOLE (FLAGYL) 500 MG tablet Take 1 tablet (500 mg total) by mouth 3 (three) times daily. 08/27/13   Concha Norway, MD  ondansetron (ZOFRAN ODT) 4 MG disintegrating tablet Take 1 tablet (4 mg total) by mouth every 8 (eight) hours as needed for nausea. 08/23/13   Tanna Furry, MD  ponatinib HCl (ICLUSIG) 15 MG tablet Take 15 mg by mouth daily. 05/06/13   Historical Provider, MD  sulfamethoxazole-trimethoprim (BACTRIM DS) 800-160 MG per tablet Take 1 tablet by mouth every Monday, Wednesday, and Friday. Takes 1 tablet Monday-Wednesday-Friday 08/27/13   Concha Norway, MD   BP 110/72  Pulse 100  Temp(Src) 98.6 F (37 C) (Oral)  Resp 18  SpO2 100% Physical Exam  Constitutional: She is oriented to person, place, and time. She appears well-developed and well-nourished. No distress.  HENT:  Head: Normocephalic.  Eyes: Conjunctivae are normal. Pupils are equal, round, and reactive to light. No scleral icterus.  Neck: Normal range of motion. Neck supple. No thyromegaly present.  Cardiovascular: Normal rate and regular rhythm.  Exam reveals no gallop and no friction rub.   No murmur heard. Pulmonary/Chest: Effort normal and breath sounds normal. No respiratory distress.  She has no wheezes. She has no rales.  Abdominal: Soft. Bowel sounds are normal. She exhibits no distension. There is no tenderness. There is no rebound.  Soft benign abdomen.  Musculoskeletal: Normal range of motion.  Neurological: She is alert and oriented to person, place, and time.  Skin: Skin is warm and dry. No rash noted.  Psychiatric: She has a normal mood and affect. Her behavior is normal.    ED Course  Procedures (including critical care time) Labs Review Labs Reviewed  CBC WITH DIFFERENTIAL - Abnormal; Notable for the following:    RBC 3.27 (*)    Hemoglobin 10.3 (*)    HCT 31.6 (*)    RDW 19.4 (*)    All other components within normal limits  COMPREHENSIVE METABOLIC PANEL - Abnormal;  Notable for the following:    Glucose, Bld 135 (*)    Albumin 3.3 (*)    AST 47 (*)    ALT 90 (*)    Alkaline Phosphatase 172 (*)    Total Bilirubin <0.2 (*)    GFR calc non Af Amer 75 (*)    GFR calc Af Amer 87 (*)    All other components within normal limits  LIPASE, BLOOD    Imaging Review No results found.   EKG Interpretation None      MDM   Final diagnoses:  C. difficile colitis  Clostridium difficile enterocolitis    Hydrated.  Appropriate for discharge. Given a prescription for by mouth Flagyl which should be adequate for her C. difficile. Followup with her physicians at clinic.    Tanna Furry, MD 08/29/13 [phone]

## 2013-09-15 ENCOUNTER — Encounter: Payer: Self-pay | Admitting: Nurse Practitioner

## 2013-09-15 ENCOUNTER — Ambulatory Visit (INDEPENDENT_AMBULATORY_CARE_PROVIDER_SITE_OTHER): Payer: Medicaid Other | Admitting: Nurse Practitioner

## 2013-09-15 VITALS — BP 116/80 | HR 104 | Ht 66.25 in | Wt 232.0 lb

## 2013-09-15 DIAGNOSIS — R209 Unspecified disturbances of skin sensation: Secondary | ICD-10-CM

## 2013-09-15 DIAGNOSIS — R51 Headache: Secondary | ICD-10-CM

## 2013-09-15 DIAGNOSIS — R200 Anesthesia of skin: Secondary | ICD-10-CM

## 2013-09-15 DIAGNOSIS — R202 Paresthesia of skin: Secondary | ICD-10-CM

## 2013-09-15 MED ORDER — AMITRIPTYLINE HCL 10 MG PO TABS: 10.0000 mg | ORAL_TABLET | Freq: Every day | ORAL | Status: DC

## 2013-09-15 NOTE — Progress Notes (Signed)
PATIENT: Lynn [name] DOB: [birth year]  REASON FOR VISIT: routine follow up for headache HISTORY FROM: patient  HISTORY OF PRESENT ILLNESS: 48 year old lady with new chronic daily headaches since June 2013 following intracerebral hemorrhage and treatment with chemotherapy for leukemia.   She returns for followup after her initial consultation with me on 04/09/12. She did not tolerate Topamax as it did not help and hence stopped it after a few weeks. She in fact develop worsening headache and was hospitalized at [facility] with headache and fever he had Topamax was discontinued and she was started on amitriptyline 10 mg at night. She was also given Phenergan for nausea seems to be working quite well. She also states that the chemotherapy dose has been reduced and that may have helped as the headaches have practically disappeared. She hasn't had headache only one day in the last 4 weeks. She did discontinue the Imitrex , oxycodone and Fioricet. 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 Lab work done on 04/09/12 showed normal ESR, ANA panel, B12, blood chemistries. TSH was slightly suppressed at 0.442miu/ml.  09/12/2012 (PS): She returns for followup after last visit on 06/06/12. She states she is doing well and has only occasional minor headaches off and on. She does take Imitrex which seems to help. She uses only once every 2 weeks or so. She continues to take amitriptyline 10 mg at night and seems to tolerate it well. She has had trouble sleeping in recent weeks. She also has some minor sinus headaches her once a week which are not disabling.   12/13/12 (LL): Ms. HRohrigreturns for 3 month revisit for headaches. She [facility] She was found in hospital to have thin subdural  hematomas along the falx, tentorium and cerebral convexities with a small volume of subarachnoid hemorrhage along the sulci of the temporal and frontal lobes. She had another round of chemotherapy and allograft bone marrow transplant; her blood counts are improving. She is not have many headaches, sometimes only sharp pain that is momentary. She is tolerating Elavil well but did not increase to 10 mg for fear of drowsiness. She takes Imitrex infrequently, but states it helps when she has to take it.   09/15/13 (LL): Ms. HTalsmareturns for headache revisit, states her headaches have been manageable, except she had one severe headache in July with nausea, vomiting and diarrhea, tested positive for cdiff.  Feeling better now. Tolerating Elavil at night. Has new complaint os tinging in medial thighs when she puts chin to chest.  No associated pain or numbness.  REVIEW OF SYSTEMS: Full 14 system review of systems performed and notable only for: tingling in medial thigh area  ALLERGIES: Allergies  Allergen Reactions  . Other Rash    Please do not use clear tegaderm dressings as patient states they pull her skin off.  Mepilex is fine.    HOME MEDICATIONS: Outpatient Prescriptions Prior to Visit  Medication Sig Dispense Refill  . acyclovir (ZOVIRAX) 800 MG tablet Take 1 tablet (800 mg total) by mouth 2 (two) times daily. .  60 tablet  5  . dicyclomine (BENTYL) 20 MG tablet Take 1 tablet (20 mg total) by mouth 2 (two) times daily.  20 tablet  0  . diphenoxylate-atropine (LOMOTIL) 2.5-0.025 MG per tablet  Take 1 tablet by mouth 4 (four) times daily as needed for diarrhea or loose stools.  30 tablet  0  . fluconazole (DIFLUCAN) 200 MG tablet Take 400 mg by mouth daily.       . folic acid (FOLVITE) 1 MG tablet Take 1 mg by mouth daily.       . insulin aspart (NOVOLOG) 100 UNIT/ML FlexPen Inject 5 Units into the skin 3 (three) times daily with meals. (If you have eaten more than 50% of your meal.)  15 mL  3    . Insulin Glargine (LANTUS) 100 UNIT/ML Solostar Pen Inject 10-12 Units into the skin every morning. Sliding scale.  15 mL  3  . magnesium chloride (SLOW-MAG) 64 MG TBEC SR tablet Take 2 tablets by mouth 3 (three) times daily.       . metFORMIN (GLUCOPHAGE) 500 MG tablet Take 500 mg by mouth 2 (two) times daily with a meal.      . metroNIDAZOLE (FLAGYL) 500 MG tablet Take 1 tablet (500 mg total) by mouth 3 (three) times daily.  30 tablet  0  . Multiple Vitamin (MULTIVITAMIN) tablet Take 1 tablet by mouth daily.      . ondansetron (ZOFRAN ODT) 4 MG disintegrating tablet Take 1 tablet (4 mg total) by mouth every 8 (eight) hours as needed for nausea.  20 tablet  0  . ondansetron (ZOFRAN) 4 MG tablet Take 1 tablet (4 mg total) by mouth every 8 (eight) hours as needed for nausea or vomiting.  10 tablet  0  . ponatinib HCl (ICLUSIG) 15 MG tablet Take 15 mg by mouth daily.      . predniSONE (DELTASONE) 10 MG tablet Take 10 mg by mouth daily with breakfast.      . prochlorperazine (COMPAZINE) 5 MG tablet Take 5 mg by mouth every 6 (six) hours as needed (nausea).       . sulfamethoxazole-trimethoprim (BACTRIM DS) 800-160 MG per tablet Take 1 tablet by mouth every Monday, Wednesday, and Friday. Takes 1 tablet Monday-Wednesday-Friday  30 tablet  3  . tacrolimus (PROGRAF) 1 MG capsule Take 2 capsules (2 mg total) by mouth 2 (two) times daily.  120 capsule  3  . traMADol (ULTRAM) 50 MG tablet Take 50 mg by mouth every 6 (six) hours as needed for moderate pain.      Marland Kitchen amitriptyline (ELAVIL) 10 MG tablet Take 1 tablet (10 mg total) by mouth at bedtime.  30 tablet  6   No facility-administered medications prior to visit.    PHYSICAL EXAM Filed Vitals:   09/15/13 0910  BP: 116/80  Pulse: 104  Height: 5' 6.25" (1.683 m)  Weight: 232 lb (105.235 kg)   Body mass index is 37.15 kg/(m^2).  Generalized: Well developed, in no acute distress  Head: normocephalic and atraumatic. Oropharynx benign  Neck:  Supple, no carotid bruits  Cardiac: Regular rate rhythm, no murmur  Musculoskeletal: No deformity   Neurological examination  Mentation: Alert oriented to time, place, history taking. Follows all commands speech and language fluent Cranial nerve II-XII: Fundoscopic exam not done. Pupils were equal round reactive to light extraocular movements were full, visual field were full on confrontational test. Facial sensation and strength were normal. hearing was intact to finger rubbing bilaterally. Uvula tongue midline. head turning and shoulder shrug and were normal and symmetric.Tongue protrusion into cheek strength was normal. Motor: The motor testing reveals 5 over 5 strength of all 4 extremities. Good symmetric motor tone is noted  throughout.  Sensory: Sensory testing is intact to soft touch on all 4 extremities. No evidence of extinction is noted.  Coordination: Cerebellar testing reveals good finger-nose-finger and heel-to-shin bilaterally.  Gait and station: Gait is normal. Tandem gait is normal. Romberg is negative. Reflexes: Deep tendon reflexes are symmetric and normal bilaterally.   ASSESSMENT: 48 year old lady with history of ALL with new chronic headaches since June 2013 which seems to have improved over the last couple of months. Exact etiology remains unclear but likely mixed transformed migraine and tension headaches with possible contribution from chemotherapy. ALL again in remission. Headaches are less frequent. New complaint os tingling in medial thighs when looking downward without pain or numbness, physical exam benign.  PLAN:  Continue amitriptyline 10 mg at night for headache prophylaxis that it seems to be tolerated and working well. Continue to use imitrex for headache and Phenergan as needed for nausea.  Check B12 level today. Return for followup in 6 months or call earlier if necessary.   Orders Placed This Encounter  Procedures  . Vitamin B12   Meds ordered this  encounter  Medications  . amitriptyline (ELAVIL) 10 MG tablet    Sig: Take 1 tablet (10 mg total) by mouth at bedtime.    Dispense:  30 tablet    Refill:  6    Order Specific Question:  Supervising Provider    Answer:  Penni Bombard [[id number]]   Return in about 6 months (around 03/18/2014) for headaches, paresthesias.  Rudi Rummage , MSN, FNP-BC, A/GNP-C 09/15/2013, 12:36 PM [facility] [address] [place], [place] [address] [phone]  Note: This document was prepared with digital dictation and possible smart phrase technology. Any transcriptional errors that result from this process are unintentional.

## 2013-09-15 NOTE — Patient Instructions (Signed)
Check B12 level today, we will call you with the result or you may see it on MyChart in 3 days.  If this is low, you may need a daily supplement, Low B12 can cause tingling and numbness especially in the fingers and feet.  Continue Amitriptyline 10 mg each night, and Imitrex as needed.  Follow up in 6 months, sooner as needed.

## 2013-09-16 LAB — VITAMIN B12: Vitamin B-12: 1564 pg/mL — ABNORMAL HIGH (ref 211–946)

## 2013-09-17 ENCOUNTER — Encounter: Payer: Self-pay | Admitting: Endocrinology

## 2013-09-17 ENCOUNTER — Ambulatory Visit (INDEPENDENT_AMBULATORY_CARE_PROVIDER_SITE_OTHER): Payer: Medicaid Other | Admitting: Endocrinology

## 2013-09-17 VITALS — BP 112/74 | HR 112 | Temp 99.1°F | Ht 66.25 in | Wt 232.0 lb

## 2013-09-17 DIAGNOSIS — E1165 Type 2 diabetes mellitus with hyperglycemia: Principal | ICD-10-CM

## 2013-09-17 DIAGNOSIS — IMO0001 Reserved for inherently not codable concepts without codable children: Secondary | ICD-10-CM

## 2013-09-17 NOTE — Patient Instructions (Addendum)
Novolog 10 units with each meal Lantus 10 units and may reduce to 8 if am sugar < 90  Please check blood sugars at least half the time about 2 hours after any meal and 3-4  times per week on waking up. Please bring blood sugar monitor to each visit

## 2013-09-17 NOTE — Progress Notes (Signed)
Patient ID: Lynn [name], female   DOB: 1965/12/08, 48 y.o.   MRN: [id number]    Reason for Appointment: Followup for Type 2 Diabetes  Referring physician: Donnie Coffin  History of Present Illness:          Diagnosis: Type 2 diabetes mellitus, date of diagnosis: 2013        Past history: She weighed nearly 300 lb in 2013 around that time her diabetes was diagnosed Although her A1c at that time was reported at 7.4 she does not remember being told about diabetes and no treatment was given  Recent history:  In 5/15 when she was getting steroids for her bone marrow transplant her blood sugar was over 400 She was started on insulin and has been taking a basal bolus insulin regimen since then On her initial consultation she was felt to have postprandial hyperglycemia since she was taking 20 mg prednisone Her NovoLog was increased to 8 units at breakfast and lunch and she has taken the same dose with every meal Also her Lantus was changed to the morning and she is taking 12 units, may reduce to 10 units if blood sugar is low normal She has checked her blood sugar more frequently but still mostly in the morning before breakfast       Oral hypoglycemic drugs the patient is taking are: Metformin 1000 mg      Side effects from medications have been: Diarrhea from Metformin 4 daily INSULIN regimen is described as: NovoLog 8 units with meals, Lantus 12 in a.m.  Glucose monitoring:  done one time a day         Glucometer:  Accu-Chek        PREMEAL Breakfast Lunch Dinner  7-11 PM  Overall  Glucose range:  91-112   199, 215   174   182-197    Mean/median:  100      135    Hypoglycemia:   none     Glycemic control:  Lab Results  Component Value Date   HGBA1C 7.4* 05/30/2013   HGBA1C 7.4* 06/17/2011   Lab Results  Component Value Date   CREATININE 1.0 08/27/2013    Self-care: The diet that the patient has been following is: tries to limit  Portions, may have some sweets    Meals: 3 meals  per day. Light supper usually, often eating eggs with breakfast but today had pancakes and fruit only           Exercise: walks 30-45 minutes every other day         Dietician visit: Most recent: 5/15 at the hospital              Compliance with the medical regimen: Fair Retinal exam: Most recent:.2013     Weight history: Wt Readings from Last 3 Encounters:  09/17/13 232 lb (105.235 kg)  09/15/13 232 lb (105.235 kg)  08/27/13 228 lb 3.2 oz (103.511 kg)      Medication List       This list is accurate as of: 09/17/13 10:17 AM.  Always use your most recent med list.               acyclovir 800 MG tablet  Commonly known as:  ZOVIRAX  Take 1 tablet (800 mg total) by mouth 2 (two) times daily. Marland Kitchen     ADVAIR DISKUS 250-50 MCG/DOSE Aepb  Generic drug:  Fluticasone-Salmeterol  Inhale 1 puff into the lungs.     amitriptyline  10 MG tablet  Commonly known as:  ELAVIL  Take 1 tablet (10 mg total) by mouth at bedtime.     dicyclomine 20 MG tablet  Commonly known as:  BENTYL  Take 1 tablet (20 mg total) by mouth 2 (two) times daily.     diphenoxylate-atropine 2.5-0.025 MG per tablet  Commonly known as:  LOMOTIL  Take 1 tablet by mouth 4 (four) times daily as needed for diarrhea or loose stools.     fluconazole 200 MG tablet  Commonly known as:  DIFLUCAN  Take 400 mg by mouth daily.     folic acid 1 MG tablet  Commonly known as:  FOLVITE  Take 1 mg by mouth daily.     insulin aspart 100 UNIT/ML FlexPen  Commonly known as:  NOVOLOG  Inject 8 Units into the skin 3 (three) times daily with meals. (If you have eaten more than 50% of your meal.)     Insulin Glargine 100 UNIT/ML Solostar Pen  Commonly known as:  LANTUS  Inject 10-12 Units into the skin every morning. Sliding scale.     metFORMIN 500 MG tablet  Commonly known as:  GLUCOPHAGE  Take 500 mg by mouth 2 (two) times daily with a meal.     metroNIDAZOLE 500 MG tablet  Commonly known as:  FLAGYL  Take 1 tablet (500  mg total) by mouth 3 (three) times daily.     multivitamin tablet  Take 1 tablet by mouth daily.     ondansetron 4 MG disintegrating tablet  Commonly known as:  ZOFRAN ODT  Take 1 tablet (4 mg total) by mouth every 8 (eight) hours as needed for nausea.     ondansetron 4 MG tablet  Commonly known as:  ZOFRAN  Take 1 tablet (4 mg total) by mouth every 8 (eight) hours as needed for nausea or vomiting.     ponatinib HCl 15 MG tablet  Commonly known as:  ICLUSIG  Take 15 mg by mouth daily.     predniSONE 10 MG tablet  Commonly known as:  DELTASONE  Take 10 mg by mouth daily with breakfast.     prochlorperazine 5 MG tablet  Commonly known as:  COMPAZINE  Take 5 mg by mouth every 6 (six) hours as needed (nausea).     SLOW-MAG 64 MG Tbec SR tablet  Generic drug:  magnesium chloride  Take 2 tablets by mouth 3 (three) times daily.     sulfamethoxazole-trimethoprim 800-160 MG per tablet  Commonly known as:  BACTRIM DS  Take 1 tablet by mouth every Monday, Wednesday, and Friday. Takes 1 tablet Monday-Wednesday-Friday     tacrolimus 1 MG capsule  Commonly known as:  PROGRAF  Take 2 capsules (2 mg total) by mouth 2 (two) times daily.     traMADol 50 MG tablet  Commonly known as:  ULTRAM  Take 50 mg by mouth every 6 (six) hours as needed for moderate pain.        Allergies:  Allergies  Allergen Reactions  . Other Rash    Please do not use clear tegaderm dressings as patient states they pull her skin off.  Mepilex is fine.    Past Medical History  Diagnosis Date  . Hypertension   . Brain bleed     07/04/11  . Pulmonary embolism     06/22/11  . Headache(784.0)   . Arthritis   . Anemia   . ALL (acute lymphoblastic leukemia)   . Leukemia   .  Diabetes mellitus without complication     Past Surgical History  Procedure Laterality Date  . Cesarean section    . Foot surgery    . Foot surgery    . Cesarean section    . Eye surgery    . Craniotomy    . [place] filter       Family History  Problem Relation Age of Onset  . Pancreatic cancer Father   . Cancer Mother     colon  . Pancreatic cancer Mother   . Diabetes Neg Hx     Social History:  reports that she has never smoked. She has never used smokeless tobacco. She reports that she does not drink alcohol or use illicit drugs.    Review of Systems       Lipids: Unknown       No results found for this basename: CHOL,  HDL,  LDLCALC,  LDLDIRECT,  TRIG,  CHOLHDL   Some leg weakness in knees  Gets short of breath on exertion     Thyroid:  No  unusual fatigue. She had a thyroid biopsy in 6/14 for a left-sided thyroid nodule which was benign  Physical Examination:  BP 112/74  Pulse 112  Temp(Src) 99.1 F (37.3 C) (Oral)  Ht 5' 6.25" (1.683 m)  Wt 232 lb (105.235 kg)  BMI 37.15 kg/m2  SpO2 99%         ASSESSMENT:  Diabetes type 2, uncontrolled  Her blood sugars are still relatively higher postprandially even though she is only on 10 mg prednisone currently Her fasting readings are consistently controlled with Lantus in the morning However she is not checking enough readings after meals as directed   PLAN:   Start checking blood sugars regularly  2 hours after meals.   Discussed blood sugar targets of at least <180  Increase NovoLog to 10 units with every meal  May reduce Lantus to 10 units now and further if blood sugars in the morning are low normal when she is taken off prednisone   Consider changing metformin to Janumet when she gets of insulin since he probably did not have good control previously also on metformin alone   Encouraged her to be more active when able to    [facility] 09/17/2013, 10:17 AM   Note: This office note was prepared with Dragon voice recognition system technology. Any transcriptional errors that result from this process are unintentional.

## 2013-09-18 NOTE — Progress Notes (Signed)
I agree with the above plan 

## 2013-09-24 ENCOUNTER — Other Ambulatory Visit: Payer: Self-pay | Admitting: *Deleted

## 2013-09-24 ENCOUNTER — Telehealth: Payer: Self-pay | Admitting: Internal Medicine

## 2013-09-24 ENCOUNTER — Other Ambulatory Visit (HOSPITAL_BASED_OUTPATIENT_CLINIC_OR_DEPARTMENT_OTHER): Payer: Medicaid Other

## 2013-09-24 ENCOUNTER — Encounter: Payer: Self-pay | Admitting: Internal Medicine

## 2013-09-24 ENCOUNTER — Ambulatory Visit (HOSPITAL_BASED_OUTPATIENT_CLINIC_OR_DEPARTMENT_OTHER): Payer: Medicaid Other | Admitting: Internal Medicine

## 2013-09-24 VITALS — BP 135/87 | HR 120 | Temp 98.9°F | Resp 20 | Ht 66.25 in | Wt 229.3 lb

## 2013-09-24 DIAGNOSIS — C9100 Acute lymphoblastic leukemia not having achieved remission: Secondary | ICD-10-CM

## 2013-09-24 DIAGNOSIS — R51 Headache: Secondary | ICD-10-CM

## 2013-09-24 DIAGNOSIS — I10 Essential (primary) hypertension: Secondary | ICD-10-CM

## 2013-09-24 DIAGNOSIS — E119 Type 2 diabetes mellitus without complications: Secondary | ICD-10-CM

## 2013-09-24 DIAGNOSIS — K219 Gastro-esophageal reflux disease without esophagitis: Secondary | ICD-10-CM

## 2013-09-24 DIAGNOSIS — Z95828 Presence of other vascular implants and grafts: Secondary | ICD-10-CM

## 2013-09-24 DIAGNOSIS — J84116 Cryptogenic organizing pneumonia: Secondary | ICD-10-CM

## 2013-09-24 DIAGNOSIS — C9102 Acute lymphoblastic leukemia, in relapse: Secondary | ICD-10-CM

## 2013-09-24 DIAGNOSIS — Z9484 Stem cells transplant status: Secondary | ICD-10-CM

## 2013-09-24 DIAGNOSIS — A0472 Enterocolitis due to Clostridium difficile, not specified as recurrent: Secondary | ICD-10-CM

## 2013-09-24 DIAGNOSIS — Z86711 Personal history of pulmonary embolism: Secondary | ICD-10-CM

## 2013-09-24 LAB — COMPREHENSIVE METABOLIC PANEL (CC13)
ALBUMIN: 3.8 g/dL (ref 3.5–5.0)
ALK PHOS: 85 U/L (ref 40–150)
ALT: 37 U/L (ref 0–55)
AST: 24 U/L (ref 5–34)
Anion Gap: 9 mEq/L (ref 3–11)
BUN: 23.6 mg/dL (ref 7.0–26.0)
CO2: 26 mEq/L (ref 22–29)
Calcium: 9.7 mg/dL (ref 8.4–10.4)
Chloride: 107 mEq/L (ref 98–109)
Creatinine: 1.3 mg/dL — ABNORMAL HIGH (ref 0.6–1.1)
Glucose: 101 mg/dl (ref 70–140)
POTASSIUM: 4.1 meq/L (ref 3.5–5.1)
SODIUM: 142 meq/L (ref 136–145)
Total Bilirubin: 0.31 mg/dL (ref 0.20–1.20)
Total Protein: 6.4 g/dL (ref 6.4–8.3)

## 2013-09-24 LAB — CBC WITH DIFFERENTIAL/PLATELET
BASO%: 0.6 % (ref 0.0–2.0)
BASOS ABS: 0 10*3/uL (ref 0.0–0.1)
EOS%: 1.5 % (ref 0.0–7.0)
Eosinophils Absolute: 0.1 10*3/uL (ref 0.0–0.5)
HCT: 34.3 % — ABNORMAL LOW (ref 34.8–46.6)
HGB: 11.3 g/dL — ABNORMAL LOW (ref 11.6–15.9)
LYMPH%: 30.2 % (ref 14.0–49.7)
MCH: 33.9 pg (ref 25.1–34.0)
MCHC: 32.8 g/dL (ref 31.5–36.0)
MCV: 103.2 fL — AB (ref 79.5–101.0)
MONO#: 0.6 10*3/uL (ref 0.1–0.9)
MONO%: 10.6 % (ref 0.0–14.0)
NEUT#: 3 10*3/uL (ref 1.5–6.5)
NEUT%: 57.1 % (ref 38.4–76.8)
Platelets: 195 10*3/uL (ref 145–400)
RBC: 3.32 10*6/uL — AB (ref 3.70–5.45)
RDW: 17.2 % — AB (ref 11.2–14.5)
WBC: 5.3 10*3/uL (ref 3.9–10.3)
lymph#: 1.6 10*3/uL (ref 0.9–3.3)

## 2013-09-24 LAB — LACTATE DEHYDROGENASE (CC13): LDH: 205 U/L (ref 125–245)

## 2013-09-24 MED ORDER — ONDANSETRON 4 MG PO TBDP: 4.0000 mg | ORAL_TABLET | Freq: Three times a day (TID) | ORAL | Status: DC | PRN

## 2013-09-24 MED ORDER — TRAMADOL HCL 50 MG PO TABS: 50.0000 mg | ORAL_TABLET | Freq: Four times a day (QID) | ORAL | Status: DC | PRN

## 2013-09-24 NOTE — Telephone Encounter (Signed)
Pt confirmed labs/ov per 08/26 POF, gave pt AVS....KJ °

## 2013-09-24 NOTE — Patient Instructions (Signed)
Nausea and Vomiting  Nausea is a sick feeling that often comes before throwing up (vomiting). Vomiting is a reflex where stomach contents come out of your mouth. Vomiting can cause severe loss of body fluids (dehydration). Children and elderly adults can become dehydrated quickly, especially if they also have diarrhea. Nausea and vomiting are symptoms of a condition or disease. It is important to find the cause of your symptoms.  CAUSES    Direct irritation of the stomach lining. This irritation can result from increased acid production (gastroesophageal reflux disease), infection, food poisoning, taking certain medicines (such as nonsteroidal anti-inflammatory drugs), alcohol use, or tobacco use.   Signals from the brain.These signals could be caused by a headache, heat exposure, an inner ear disturbance, increased pressure in the brain from injury, infection, a tumor, or a concussion, pain, emotional stimulus, or metabolic problems.   An obstruction in the gastrointestinal tract (bowel obstruction).   Illnesses such as diabetes, hepatitis, gallbladder problems, appendicitis, kidney problems, cancer, sepsis, atypical symptoms of a heart attack, or eating disorders.   Medical treatments such as chemotherapy and radiation.   Receiving medicine that makes you sleep (general anesthetic) during surgery.  DIAGNOSIS  Your caregiver may ask for tests to be done if the problems do not improve after a few days. Tests may also be done if symptoms are severe or if the reason for the nausea and vomiting is not clear. Tests may include:   Urine tests.   Blood tests.   Stool tests.   Cultures (to look for evidence of infection).   X-rays or other imaging studies.  Test results can help your caregiver make decisions about treatment or the need for additional tests.  TREATMENT  You need to stay well hydrated. Drink frequently but in small amounts.You may wish to drink water, sports drinks, clear broth, or eat  frozen ice pops or gelatin dessert to help stay hydrated.When you eat, eating slowly may help prevent nausea.There are also some antinausea medicines that may help prevent nausea.  HOME CARE INSTRUCTIONS    Take all medicine as directed by your caregiver.   If you do not have an appetite, do not force yourself to eat. However, you must continue to drink fluids.   If you have an appetite, eat a normal diet unless your caregiver tells you differently.   Eat a variety of complex carbohydrates (rice, wheat, potatoes, bread), lean meats, yogurt, fruits, and vegetables.   Avoid high-fat foods because they are more difficult to digest.   Drink enough water and fluids to keep your urine clear or pale yellow.   If you are dehydrated, ask your caregiver for specific rehydration instructions. Signs of dehydration may include:   Severe thirst.   Dry lips and mouth.   Dizziness.   Dark urine.   Decreasing urine frequency and amount.   Confusion.   Rapid breathing or pulse.  SEEK IMMEDIATE MEDICAL CARE IF:    You have blood or brown flecks (like coffee grounds) in your vomit.   You have black or bloody stools.   You have a severe headache or stiff neck.   You are confused.   You have severe abdominal pain.   You have chest pain or trouble breathing.   You do not urinate at least once every 8 hours.   You develop cold or clammy skin.   You continue to vomit for longer than 24 to 48 hours.   You have a fever.  MAKE SURE YOU:      Understand these instructions.   Will watch your condition.   Will get help right away if you are not doing well or get worse.  Document Released: 01/16/2005 Document Revised: 04/10/2011 Document Reviewed: 06/15/2010  [employer] Patient Information 2015 [employer]. This information is not intended to replace advice given to you by your health care provider. Make sure you discuss any questions you have with your health care provider.  Dehydration, Adult  Dehydration is when you  lose more fluids from the body than you take in. Vital organs like the kidneys, brain, and heart cannot function without a proper amount of fluids and salt. Any loss of fluids from the body can cause dehydration.   CAUSES    Vomiting.   Diarrhea.   Excessive sweating.   Excessive urine output.   Fever.  SYMPTOMS   Mild dehydration   Thirst.   Dry lips.   Slightly dry mouth.  Moderate dehydration   Very dry mouth.   Sunken eyes.   Skin does not bounce back quickly when lightly pinched and released.   Dark urine and decreased urine production.   Decreased tear production.   Headache.  Severe dehydration   Very dry mouth.   Extreme thirst.   Rapid, weak pulse (more than 100 beats per minute at rest).   Cold hands and feet.   Not able to sweat in spite of heat and temperature.   Rapid breathing.   Blue lips.   Confusion and lethargy.   Difficulty being awakened.   Minimal urine production.   No tears.  DIAGNOSIS   Your caregiver will diagnose dehydration based on your symptoms and your exam. Blood and urine tests will help confirm the diagnosis. The diagnostic evaluation should also identify the cause of dehydration.  TREATMENT   Treatment of mild or moderate dehydration can often be done at home by increasing the amount of fluids that you drink. It is best to drink small amounts of fluid more often. Drinking too much at one time can make vomiting worse. Refer to the home care instructions below.  Severe dehydration needs to be treated at the hospital where you will probably be given intravenous (IV) fluids that contain water and electrolytes.  HOME CARE INSTRUCTIONS    Ask your caregiver about specific rehydration instructions.   Drink enough fluids to keep your urine clear or pale yellow.   Drink small amounts frequently if you have nausea and vomiting.   Eat as you normally do.   Avoid:   Foods or drinks high in sugar.   Carbonated drinks.   Juice.   Extremely hot or cold  fluids.   Drinks with caffeine.   Fatty, greasy foods.   Alcohol.   Tobacco.   Overeating.   Gelatin desserts.   Wash your hands well to avoid spreading bacteria and viruses.   Only take over-the-counter or prescription medicines for pain, discomfort, or fever as directed by your caregiver.   Ask your caregiver if you should continue all prescribed and over-the-counter medicines.   Keep all follow-up appointments with your caregiver.  SEEK MEDICAL CARE IF:   You have abdominal pain and it increases or stays in one area (localizes).   You have a rash, stiff neck, or severe headache.   You are irritable, sleepy, or difficult to awaken.   You are weak, dizzy, or extremely thirsty.  SEEK IMMEDIATE MEDICAL CARE IF:    You are unable to keep fluids down or you get worse despite   treatment.   You have frequent episodes of vomiting or diarrhea.   You have blood or green matter (bile) in your vomit.   You have blood in your stool or your stool looks black and tarry.   You have not urinated in 6 to 8 hours, or you have only urinated a small amount of very dark urine.   You have a fever.   You faint.  MAKE SURE YOU:    Understand these instructions.   Will watch your condition.   Will get help right away if you are not doing well or get worse.  Document Released: 01/16/2005 Document Revised: 04/10/2011 Document Reviewed: 09/05/2010  [employer] Patient Information 2015 [employer]. This information is not intended to replace advice given to you by your health care provider. Make sure you discuss any questions you have with your health care provider.

## 2013-09-24 NOTE — Progress Notes (Signed)
[place], [place] [address] [place] [address]  DIAGNOSIS: Leukemia, acute lymphoid - Plan: CBC with Differential, Basic metabolic panel (Bmet) - [facility]  History of pulmonary embolism  History of peripheral stem cell transplant  Greenfield filter in place  Cryptogenic organizing pneumonia  Clostridium difficile colitis  Chief Complaint  Patient presents with  . Leukemia, acute lymphoid   CURRENT THERAPY:   Dasatinib 50 mg daily, which was started on 05/31/2012. The patient had been on Dasatinib 100 mg daily at least since 12/15/2011. The dose was reduced because of neutropenia. The patient also received dasatinib during induction treatment which dates back to May 2013.  She had relapse on Sept 22 and was hospitalized until Oct 31.  She underwent Allogeneic SCT (9/10 match with mismatch at DQ) on January 8th at [facility] (Dr. Harvel Ricks).  INTERVAL HISTORY: Lynn [name] 48 y.o. 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  She was last seen by me on 08/27/2013.    She is being managed by [facility].  She underwent Allogeneic stem cell trasnplant on 02/06/2013.  She was admitted on 01/28/2013 for prepartive regimen of Busulfan/CTX.  Her post transplant course was complicated by C. Difficile positive diarrhea treated with oral vancomycin, pneumonia likely bacterial.  CXR with new patchy opacities treated with zosyn and moxifloxacin and acute kidney injury secondary to dehydration from diarrhea and ATN which improved with hydration.  Her WBC engrafted on 02/24/2013 and platelets on 02/20/2013.    She last saw Dr. Harvel Ricks on 08/26/2013.  From a transplant standpoint, she reports things are going well.  She reports resolution of C. Difficile with flagyl treatment.  She is not having diarrhea.  She does however note intermittent nausea relieved partially with zofran ODT.  She also reports taking ultram prn  for generalized pain including left back pain.  She reports increasing her oral hydration and feeling her energy level is improving overall.   MEDICAL HISTORY: Past Medical History  Diagnosis Date  . Hypertension   . Brain bleed     07/04/11  . Pulmonary embolism     06/22/11  . Headache(784.0)   . Arthritis   . Anemia   . ALL (acute lymphoblastic leukemia)   . Leukemia   . Diabetes mellitus without complication     INTERIM HISTORY: has Microcytosis; Acute pulmonary embolism; Leukocytosis; Thrombocytopenia; Anemia; Hilar lymphadenopathy; Leukemia, acute lymphoid; Acute lymphoid leukemia; Acute lymphoid leukemia in remission; Transfusion reaction; Viral meningitis; Encounter for antineoplastic chemotherapy; Essential hypertension; History of peripheral stem cell transplant; Subdural hemorrhage; Neutropenia; Fever; Right thyroid nodule; Port-a-cath in place; History of pulmonary embolism; Greenfield filter in place; Pneumonia; CAP (community acquired pneumonia); Type II or unspecified type diabetes mellitus without mention of complication, uncontrolled; Clostridium difficile colitis; Cryptogenic organizing pneumonia; Dehydration; BOOP (bronchiolitis obliterans with organizing pneumonia); and HCAP (healthcare-associated pneumonia) on her problem list.    ALLERGIES:  is allergic to other.  MEDICATIONS: has a current medication list which includes the following prescription(s): acyclovir, amitriptyline, fluconazole, fluticasone-salmeterol, folic acid, insulin aspart, insulin glargine, magnesium chloride, metformin, multivitamin, prednisone, prochlorperazine, sulfamethoxazole-trimethoprim, tacrolimus, ondansetron, ponatinib hcl, and tramadol.  SURGICAL HISTORY:  Past Surgical History  Procedure Laterality Date  . Cesarean section    . Foot surgery    . Foot surgery    . Cesarean section    . Eye surgery    . Craniotomy    .  Greenfield filter     PROBLEM LIST:  1. B-cell acute lymphoblastic  leukemia, Philadelphia chromosome positive, t(9; 22) initially with 71% blasts seen on bone marrow carried out on Jun 19, 2011. Subsequent bone marrow at [facility] on 06/22/2011 showed 98% blasts and peripheral blood showed 71% blasts. The patient received induction treatment with dasatinib (Sprycel) and Decadron. Her hospital course was complicated by the development of a posterior fossa subdural hematoma requiring a suboccipital craniotomy and evacuation of the hematoma on 07/04/2011. The patient also had neutropenic fever with negative cultures covered with broad-spectrum antibiotics. She had some headaches, vaginal bleeding and transaminitis. Admission to [facility] was from 06/21/2011 through 07/17/2011. Bone marrow carried out on 07/06/2011 showed a hypocellular bone marrow with no evidence for ALL. The patient was enrolled on protocol CALGB 83151. That protocol consisted of treatment with dasatinib along with chemotherapy. A bone marrow carried out on 07/28/2011 showed pan hypoplasia with no evidence for acute leukemia. Cytogenetics were normal. FISH studies were negative for t(9; 22). PCR for small p190 in both the peripheral blood and bone marrow returned negative at 0.000. On 08/08/2011 the patient received high-dose methotrexate with leucovorin rescue. She also received IV vincristine 2.0 mg and CNS prophylaxis with intrathecal methotrexate/hydrocortisone. CSF was negative. On 09/18/2011 bone marrow biopsy showed no evidence of ALL. PCR for BCR/ABL showed 0.00001 fusion events. It was felt that the patient had achieved a complete hematologic response. On 09/26/2011 mobilization therapy with cytarabine/etoposide was started The patient was admitted to the hospital from 11/14/2011 through 11/28/2011, at which time she received high-dose therapy and her autologous stem cell transplant. She received melphalan on days -2 and days -1. Day 0  was 11/16/2011. The patient also received G-CSF. She had severe mucositis and dysphagia on 11/20/2011 which required morphine by PCA. She had some fevers on 11/23/2011. She was treated with antibiotics for prophylaxis. She had some headaches, but those have subsequently resolved. Venous Dopplers of her lower extremities were done bilaterally because of some swelling of her legs. These were negative for blood clots. It will be recalled that the patient does have an inferior vena cava Greenfield filter that was placed on 07/07/2011 for her prior history of pulmonary emboli. As stated, the patient was discharged from [facility] on October 29th. Shayna tells me that she was restarted on dasatinib 100 mg daily in mid November. Records from [facility] indicate that bone marrows carried out on 10/16/2011, 11/02/2011, 12/15/2011, 03/08/2012 and 05/31/2012 were negative for any signs of leukemia.  2. Pulmonary emboli involving the right upper and right lower lobes with positive CT chest angiogram on 06/17/2011 and negative Dopplers.  3. Development of subdural hematoma involving the posterior fossa when the patient was hospitalized at [facility], status post suboccipital craniotomy and evacuation of hematoma on 07/04/2011.  4. History of hypertension.  5. Morbid obesity.  6. Arthritis involving the right hip.  7. History of palpitations.  8. History of migraine headaches.  9. Placement of inferior vena cava Greenfield filter on 07/07/2011.  10. Mild renal insufficiency.  11. Possible 1.1 cm, hypodense lesion in the right thyroid lobe noted on CT angiogram of the chest from 05/13/2012.  12. Right-sided double lumen Port-A-Cath placed at [facility] on 07/12/2011.   REVIEW OF SYSTEMS:   Constitutional: Denies fevers, chills or abnormal weight loss; reports some  periods of  shakiness due to low blood glucose. Eyes: Denies blurriness of vision Ears, nose, mouth, throat, and face: Denies mucositis or sore throat Respiratory: Denies cough, dyspnea or wheezes Cardiovascular: Denies palpitation, chest discomfort or lower extremity swelling Gastrointestinal:  Denies nausea, heartburn or change in bowel habits Skin: Reports mild skin hypopigmentation since last transplant.  Lymphatics: Denies new lymphadenopathy or easy bruising Neurological:Denies numbness, tingling or new weaknesses Behavioral/Psych: Mood is stable, no new changes  All other systems were reviewed with the patient and are negative.  PHYSICAL EXAMINATION: ECOG PERFORMANCE STATUS: 0 - Asymptomatic  Blood pressure 135/87, pulse 120, temperature 98.9 F (37.2 C), temperature source Oral, resp. rate 20, height 5' 6.25" (1.683 m), weight 229 lb 4.8 oz (104.01 kg).  GENERAL:alert, no distress and comfortable; chronically ill appearing woman with alopecia; moderately obese SKIN: skin color, texture, turgor are normal, no rashes or significant lesions EYES: normal, Conjunctiva are pink and non-injected, sclera clear OROPHARYNX:no exudate, no erythema and lips, buccal mucosa, and tongue with dark pigmentation.  NECK: supple, thyroid normal size, non-tender, without nodularity LYMPH:  no palpable lymphadenopathy in the cervical, axillary or supraclavicular LUNGS: clear to auscultation and percussion with normal breathing effort HEART: tachycardic with regular  rhythm and no murmurs and no lower extremity edema ABDOMEN:abdomen soft, non-tender and normal bowel sounds Musculoskeletal:no cyanosis of digits and no clubbing  NEURO: alert & oriented x 3 with fluent speech, no focal motor/sensory deficits  LABORATORY DATA: Results for orders placed in visit on 09/24/13 (from the past 48 hour(s))  CBC WITH DIFFERENTIAL     Status: Abnormal   Collection Time    09/24/13  9:03 AM      Result Value Ref Range    WBC 5.3  3.9 - 10.3 10e3/uL   NEUT# 3.0  1.5 - 6.5 10e3/uL   HGB 11.3 (*) 11.6 - 15.9 g/dL   HCT 34.3 (*) 34.8 - 46.6 %   Platelets 195  145 - 400 10e3/uL   MCV 103.2 (*) 79.5 - 101.0 fL   MCH 33.9  25.1 - 34.0 pg   MCHC 32.8  31.5 - 36.0 g/dL   RBC 3.32 (*) 3.70 - 5.45 10e6/uL   RDW 17.2 (*) 11.2 - 14.5 %   lymph# 1.6  0.9 - 3.3 10e3/uL   MONO# 0.6  0.1 - 0.9 10e3/uL   Eosinophils Absolute 0.1  0.0 - 0.5 10e3/uL   Basophils Absolute 0.0  0.0 - 0.1 10e3/uL   NEUT% 57.1  38.4 - 76.8 %   LYMPH% 30.2  14.0 - 49.7 %   MONO% 10.6  0.0 - 14.0 %   EOS% 1.5  0.0 - 7.0 %   BASO% 0.6  0.0 - 2.0 %  COMPREHENSIVE METABOLIC PANEL (EU23)     Status: Abnormal   Collection Time    09/24/13  9:03 AM      Result Value Ref Range   Sodium 142  136 - 145 mEq/L   Potassium 4.1  3.5 - 5.1 mEq/L   Chloride 107  98 - 109 mEq/L   CO2 26  22 - 29 mEq/L   Glucose 101  70 - 140 mg/dl   BUN 23.6  7.0 - 26.0 mg/dL   Creatinine 1.3 (*) 0.6 - 1.1 mg/dL   Total Bilirubin 0.31  0.20 - 1.20 mg/dL   Alkaline Phosphatase 85  40 - 150 U/L   AST 24  5 - 34 U/L   ALT 37  0 - 55  U/L   Total Protein 6.4  6.4 - 8.3 g/dL   Albumin 3.8  3.5 - 5.0 g/dL   Calcium 9.7  8.4 - 10.4 mg/dL   Anion Gap 9  3 - 11 mEq/L  LACTATE DEHYDROGENASE (CC13)     Status: None   Collection Time    09/24/13  9:03 AM      Result Value Ref Range   LDH 205  125 - 245 U/L     Labs:  Lab Results  Component Value Date   WBC 5.3 09/24/2013   HGB 11.3* 09/24/2013   HCT 34.3* 09/24/2013   MCV 103.2* 09/24/2013   PLT 195 09/24/2013   NEUTROABS 3.0 09/24/2013      Chemistry      Component Value Date/Time   NA 142 09/24/2013 0903   NA 144 08/23/2013 1700   K 4.1 09/24/2013 0903   K 3.7 08/23/2013 1700   CL 107 08/23/2013 1700   CL 106 07/09/2012 0949   CO2 26 09/24/2013 0903   CO2 23 08/23/2013 1700   BUN 23.6 09/24/2013 0903   BUN 11 08/23/2013 1700   CREATININE 1.3* 09/24/2013 0903   CREATININE 0.89 08/23/2013 1700      Component Value  Date/Time   CALCIUM 9.7 09/24/2013 0903   CALCIUM 9.2 08/23/2013 1700   ALKPHOS 85 09/24/2013 0903   ALKPHOS 172* 08/23/2013 1700   AST 24 09/24/2013 0903   AST 47* 08/23/2013 1700   ALT 37 09/24/2013 0903   ALT 90* 08/23/2013 1700   BILITOT 0.31 09/24/2013 0903   BILITOT <0.2* 08/23/2013 1700     Basic Metabolic Panel:  Recent Labs Lab 09/24/13 0903  NA 142  K 4.1  CO2 26  GLUCOSE 101  BUN 23.6  CREATININE 1.3*  CALCIUM 9.7   GFR Estimated Creatinine Clearance: 64 ml/min (by C-G formula based on Cr of 1.3). Liver Function Tests:  Recent Labs Lab 09/24/13 0903  AST 24  ALT 37  ALKPHOS 85  BILITOT 0.31  PROT 6.4  ALBUMIN 3.8   CBC:  Recent Labs Lab 09/24/13 0903  WBC 5.3  NEUTROABS 3.0  HGB 11.3*  HCT 34.3*  MCV 103.2*  PLT 195   Studies:  No results found.   RADIOGRAPHIC STUDIES: 1. CT angiogram of the chest on 06/17/2011 showed right-sided pulmonary emboli. There were mildly prominent mediastinal and right hilar lymph nodes.  2. CT-guided iliac bone aspiration and core biopsy were carried out on 06/19/2011.  3. CT of the head without IV contrast on 07/23/2011 showed that the patient was status post suboccipital craniotomy and right frontal bur hole placement. Otherwise negative noncontrast CT appearance of the brain.  4. Chest x-ray, 2 view, from 10/08/2011 was negative.  5. Chest x-ray, 2 view, on 02/06/2012 was negative.  6. CT angiogram of the chest on 02/06/2012 showed no evidence of pulmonary embolism or any other acute abnormalities. Lungs were clear.  7. Chest x-ray, 2 view, from 03/18/2012, showed no active cardiopulmonary disease.  8. CT scan of the head without IV contrast on 03/18/2012 showed no acute findings. No evidence of intracranial hemorrhage. There was evidence of the previous sub occipital craniotomy and right frontal burhole.  9. 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 CT  scan of the head without IV contrast on 04/25/2012 showed no acute intracranial abnormality.  11. Chest x-ray, 2 view, on 05/13/2012 showed no acute disease in the chest.  12. CT angiogram of the chest on 05/13/2012 showed no evidence of pulmonary embolism. There was minimal bibasilar atelectasis. There was a vague 1.1-cm hypodensity within the right thyroid lobe. Consider further evaluation with thyroid ultrasound. 13. Chest x-ray, 2 view, from 12/03/2012, demonstrated increased lung markings in the retrocardiac region on the left suggests subsegmental atelectasis or early infiltrate. There is no pleural effusion. No pulmonary parenchymal mass is demonstrated. There is no evidence of CHF.  ASSESSMENT: Everardo All 48 y.o. female with a history of Leukemia, acute lymphoid - Plan: CBC with Differential, Basic metabolic panel (Bmet) - [facility]  History of pulmonary embolism  History of peripheral stem cell transplant  Greenfield filter in place  Cryptogenic organizing pneumonia  Clostridium difficile colitis   PLAN:   1. C. Difficile colitis (08/20/2013), resolved.  --She was diagnosed with C. Difficile on 07/22 at [place]. She reports difficulty with continuing her flagyl due to financial concerns.  Her diarrhea eased down from 8 stools daily to less than one with notable improvement in her abdominal discomfort and cramping.  She completed a course of flagyl treatment with resolution of her diarrhea.   2. Mild elevated creatinine. --We counseled the patient to avoid nephrotoxins.  She will continue adequate oral hydration.    3. Cryptogenic organizing pneumonia.  --She had a VATs on 05/21 consistent with the above at [facility] per patient.  Started prednisone 60 mg daily then tapered to 10 mg daily and planning to  start 5 mg daily tomorrow.  She notes improvement in her baseline dyspnea and resolution of her cough.   4. ALL, relapsed s/p allogeneic stem cell transplant. Day 0 was 02/06/2013.    Counts are stable.   --Patient has experienced relapsed disease as noted above.  She had her  2nd ASCT at [facility].  Her counts demonstrate recovery presently.    --She will continue on prophylaxis medications including acyclovir 800 mg bid, Bactrim DS on Mon, Wed, and Friday.  --She was instructed to contact us should she need any interval labs.  She will forward any neurological records and hematological records to our offices. --She has antiemetics ondansetron 8 mg prn and prochlorperazine 5 mg prn and promethazine 12.5 mg q 6 hours prn.   5. Chronic headaches plus leg tingling. --She is following with neurology.  Continue imitrex 100 mg prn.   6. GERD. --Continue pantoprazole 40 mg daily.   7. History of pulmonary emboli involving the right upper and right lower lobes with positive CT chest angiogram on 06/17/2011 and negative Dopplers.  --Placement of inferior vena cava Greenfield filter on 07/07/2011.   8. Development of subdural hematoma involving the posterior fossa when the patient was hospitalized at [facility], status post suboccipital craniotomy and evacuation of hematoma on 07/04/2011.   9. History of hypertension.  -- Observing off medication.   10. Elevated liver enzymes, resolved.  --We will continue to trend and avoid hepatoxins.   11. DM2 --She is being followed by endo.   12. Follow-up --We will plan to see her again in 1 month at which time we will check her CBC, chemistries, LDH.  Previously she was treated according to protocol CALGB 49675.    All questions were answered. The patient knows to call the clinic with any problems,  questions or concerns. We can certainly see the patient much sooner if necessary.  I spent 15 minutes counseling the patient  face to face. The total time spent in the appointment was 25 minutes.    , , MD 09/24/2013 10:02 AM

## 2013-10-15 ENCOUNTER — Ambulatory Visit (INDEPENDENT_AMBULATORY_CARE_PROVIDER_SITE_OTHER): Payer: Medicaid Other | Admitting: Endocrinology

## 2013-10-15 ENCOUNTER — Encounter: Payer: Self-pay | Admitting: Endocrinology

## 2013-10-15 VITALS — BP 109/85 | HR 107 | Temp 97.7°F | Resp 16 | Ht 66.25 in | Wt 236.6 lb

## 2013-10-15 DIAGNOSIS — IMO0001 Reserved for inherently not codable concepts without codable children: Secondary | ICD-10-CM

## 2013-10-15 DIAGNOSIS — E1165 Type 2 diabetes mellitus with hyperglycemia: Principal | ICD-10-CM

## 2013-10-15 MED ORDER — SITAGLIP PHOS-METFORMIN HCL ER 100-1000 MG PO TB24: ORAL_TABLET | ORAL | Status: DC

## 2013-10-15 NOTE — Progress Notes (Signed)
Patient ID: Lynn [name], female   DOB: March 05, 1965, 48 y.o.   MRN: [id number]    Reason for Appointment: Followup for Type 2 Diabetes  Referring physician: Donnie Coffin  History of Present Illness:          Diagnosis: Type 2 diabetes mellitus, date of diagnosis: 2013        Past history: She weighed nearly 300 lb in 2013 around that time her diabetes was diagnosed Although her A1c at that time was reported at 7.4 she does not remember being told about diabetes and no treatment was given In 5/15 when she was getting steroids for her bone marrow transplant her blood sugar was over 400 She was started on insulin and had been taking a basal bolus insulin regimen subsequently  Recent history:   On her initial consultation she was felt to have postprandial hyperglycemia and was taking 20 mg prednisone Over the last 10 days her blood sugars appear to be coming down with stopping prednisone, was taking 10 mg She continues to take 10 units of NovoLog with meals and also 10 units of Lantus Currently she is checking her blood sugars mostly in the afternoons and sporadically in the evenings She did have an unusually high reading on 9/1 in the evening and she thinks this was from eating a lot of fruit However has had readings in the 60s twice, once at 5:30 PM and another one at 11:30 PM, the last one after eating a light meal She has checked her blood sugar less than once a day and somewhat irregularly, eating meals at irregular times also No recent A1c available       Oral hypoglycemic drugs the patient is taking are: Metformin 1000 mg      Side effects from medications have been: Diarrhea from Metformin 4 daily INSULIN regimen is described as: NovoLog10 units with meals, Lantus  10 in a.m.  Glucose monitoring:  done one time a day         Glucometer:  Accu-Chek       PREMEAL Breakfast Lunch Dinner Bedtime Overall  Glucose range:  86-116   83-144   66-179   67-314    Mean/median:      129     Glycemic control:  Lab Results  Component Value Date   HGBA1C 7.4* 05/30/2013   HGBA1C 7.4* 06/17/2011   Lab Results  Component Value Date   CREATININE 1.3* 09/24/2013    Self-care: The diet that the patient has been following is: tries to limit  Portions, may have some sweets    Meals: 2 meals per day. Light supper usually,  eating eggs or cereal with breakfast. Some fruits  Exercise: none except housework         Dietician visit: Most recent: 5/15 at the hospital              Compliance with the medical regimen: Fair Retinal exam: Most recent:.2013     Weight history: Wt Readings from Last 3 Encounters:  10/15/13 236 lb 9.6 oz (107.321 kg)  09/24/13 229 lb 4.8 oz (104.01 kg)  09/17/13 232 lb (105.235 kg)      Medication List       This list is accurate as of: 10/15/13 11:59 PM.  Always use your most recent med list.               acyclovir 800 MG tablet  Commonly known as:  ZOVIRAX  Take 1 tablet (800 mg  total) by mouth 2 (two) times daily. Marland Kitchen     ADVAIR DISKUS 250-50 MCG/DOSE Aepb  Generic drug:  Fluticasone-Salmeterol  Inhale 1 puff into the lungs as needed.     amitriptyline 10 MG tablet  Commonly known as:  ELAVIL  Take 1 tablet (10 mg total) by mouth at bedtime.     fluconazole 200 MG tablet  Commonly known as:  DIFLUCAN  Take 400 mg by mouth daily.     folic acid 1 MG tablet  Commonly known as:  FOLVITE  Take 1 mg by mouth daily.     insulin aspart 100 UNIT/ML FlexPen  Commonly known as:  NOVOLOG  Inject 10 Units into the skin 3 (three) times daily with meals. (If you have eaten more than 50% of your meal.)     Insulin Glargine 100 UNIT/ML Solostar Pen  Commonly known as:  LANTUS  Inject 10 Units into the skin every morning. Sliding scale.     metFORMIN 500 MG tablet  Commonly known as:  GLUCOPHAGE  Take 500 mg by mouth 2 (two) times daily with a meal.     montelukast 5 MG chewable tablet  Commonly known as:  SINGULAIR  Chew 5 mg by mouth at  bedtime.     multivitamin tablet  Take 1 tablet by mouth daily.     ondansetron 4 MG disintegrating tablet  Commonly known as:  ZOFRAN ODT  Take 1 tablet (4 mg total) by mouth every 8 (eight) hours as needed for nausea.     prochlorperazine 5 MG tablet  Commonly known as:  COMPAZINE  Take 5 mg by mouth every 6 (six) hours as needed (nausea).     SitaGLIPtin-MetFORMIN HCl [phone] MG Tb24  Commonly known as:  JANUMET XR  One tab with dinner daily     SLOW-MAG 64 MG Tbec SR tablet  Generic drug:  magnesium chloride  Take 2 tablets by mouth 3 (three) times daily.     sulfamethoxazole-trimethoprim 800-160 MG per tablet  Commonly known as:  BACTRIM DS  Take 1 tablet by mouth every Monday, Wednesday, and Friday. Takes 1 tablet Monday-Wednesday-Friday     tacrolimus 1 MG capsule  Commonly known as:  PROGRAF  Take 2 capsules (2 mg total) by mouth 2 (two) times daily.     traMADol 50 MG tablet  Commonly known as:  ULTRAM  Take 1 tablet (50 mg total) by mouth every 6 (six) hours as needed for moderate pain.        Allergies:  Allergies  Allergen Reactions  . Other Rash    Please do not use clear tegaderm dressings as patient states they pull her skin off.  Mepilex is fine.    Past Medical History  Diagnosis Date  . Hypertension   . Brain bleed     07/04/11  . Pulmonary embolism     06/22/11  . Headache(784.0)   . Arthritis   . Anemia   . ALL (acute lymphoblastic leukemia)   . Leukemia   . Diabetes mellitus without complication     Past Surgical History  Procedure Laterality Date  . Cesarean section    . Foot surgery    . Foot surgery    . Cesarean section    . Eye surgery    . Craniotomy    . [place] filter      Family History  Problem Relation Age of Onset  . Pancreatic cancer Father   . Cancer Mother  colon  . Pancreatic cancer Mother   . Diabetes Neg Hx     Social History:  reports that she has never smoked. She has never used smokeless  tobacco. She reports that she does not drink alcohol or use illicit drugs.    Review of Systems       Lipids: Unknown       No results found for this basename: CHOL,  HDL,  LDLCALC,  LDLDIRECT,  TRIG,  CHOLHDL   Some leg weakness in knees  Gets short of breath on exertion     Thyroid:  No  unusual fatigue. She had a thyroid biopsy in 6/14 for a left-sided thyroid nodule which was benign  Physical Examination:  BP 109/85  Pulse 107  Temp(Src) 97.7 F (36.5 C)  Resp 16  Ht 5' 6.25" (1.683 m)  Wt 236 lb 9.6 oz (107.321 kg)  BMI 37.89 kg/m2  SpO2 99%         ASSESSMENT:  Diabetes type 2, uncontrolled  Her blood sugars are gradually improving with her coming off prednisone However, for high readings are related to inconsistent carbohydrate intake  Since she has lost weight since her initial diagnosis of diabetes and she will not be needing prednisone most likely will not need insulin also  PLAN:   Start Janumet XR 100/1000 daily  Stop NovoLog now and Lantus insulin in about a week  Encouraged her to be more active when able to  She will call if she has any difficulties with her blood sugar control  Balanced meals with controlled carbohydrate   , 10/16/2013, 1:05 PM   Note: This office note was prepared with Dragon voice recognition system technology. Any transcriptional errors that result from this process are unintentional.

## 2013-10-15 NOTE — Patient Instructions (Addendum)
Please check blood sugars at least half the time about 2 hours after any meal and 3 times per week on waking up. Please bring blood sugar monitor to each visit  Avoid cereal in ams   Lantus 8 units daily and stop Novolog  If sugar in am < 90  Then stop Lantus

## 2013-10-16 ENCOUNTER — Other Ambulatory Visit (INDEPENDENT_AMBULATORY_CARE_PROVIDER_SITE_OTHER): Payer: Medicaid Other

## 2013-10-16 DIAGNOSIS — E1165 Type 2 diabetes mellitus with hyperglycemia: Principal | ICD-10-CM

## 2013-10-16 DIAGNOSIS — IMO0001 Reserved for inherently not codable concepts without codable children: Secondary | ICD-10-CM

## 2013-10-16 LAB — BASIC METABOLIC PANEL
BUN: 25 mg/dL — ABNORMAL HIGH (ref 6–23)
CALCIUM: 9.3 mg/dL (ref 8.4–10.5)
CO2: 23 meq/L (ref 19–32)
Chloride: 107 mEq/L (ref 96–112)
Creatinine, Ser: 1.9 mg/dL — ABNORMAL HIGH (ref 0.4–1.2)
GFR: 36.12 mL/min — ABNORMAL LOW (ref >60.00)
GLUCOSE: 179 mg/dL — AB (ref 70–99)
Potassium: 4.4 mEq/L (ref 3.5–5.1)
SODIUM: 139 meq/L (ref 135–145)

## 2013-10-16 LAB — HEMOGLOBIN A1C: Hgb A1c MFr Bld: 6.5 % (ref 4.6–6.5)

## 2013-10-21 ENCOUNTER — Telehealth: Payer: Self-pay | Admitting: *Deleted

## 2013-10-21 NOTE — Telephone Encounter (Signed)
When I gave the patient her lab results about her kidney function being worse, she wanted to know if she was suppose to start taking the Janumet?  Please advise

## 2013-10-21 NOTE — Telephone Encounter (Signed)
She can hold off on this until kidney function better, continue with both insulin doses as long as sugars are not getting low

## 2013-10-21 NOTE — Telephone Encounter (Signed)
Noted, left message on patients vm

## 2013-10-24 ENCOUNTER — Other Ambulatory Visit (HOSPITAL_BASED_OUTPATIENT_CLINIC_OR_DEPARTMENT_OTHER): Payer: Medicaid Other

## 2013-10-24 ENCOUNTER — Telehealth: Payer: Self-pay | Admitting: Hematology

## 2013-10-24 ENCOUNTER — Encounter: Payer: Self-pay | Admitting: Hematology

## 2013-10-24 ENCOUNTER — Ambulatory Visit (HOSPITAL_BASED_OUTPATIENT_CLINIC_OR_DEPARTMENT_OTHER): Payer: Medicaid Other | Admitting: Hematology

## 2013-10-24 ENCOUNTER — Ambulatory Visit (HOSPITAL_BASED_OUTPATIENT_CLINIC_OR_DEPARTMENT_OTHER): Payer: Medicaid Other

## 2013-10-24 VITALS — BP 124/90 | HR 113 | Temp 98.6°F | Ht 66.25 in | Wt 236.0 lb

## 2013-10-24 DIAGNOSIS — E860 Dehydration: Secondary | ICD-10-CM

## 2013-10-24 DIAGNOSIS — C9102 Acute lymphoblastic leukemia, in relapse: Secondary | ICD-10-CM

## 2013-10-24 DIAGNOSIS — E119 Type 2 diabetes mellitus without complications: Secondary | ICD-10-CM

## 2013-10-24 DIAGNOSIS — Z86718 Personal history of other venous thrombosis and embolism: Secondary | ICD-10-CM

## 2013-10-24 DIAGNOSIS — C9100 Acute lymphoblastic leukemia not having achieved remission: Secondary | ICD-10-CM

## 2013-10-24 LAB — BASIC METABOLIC PANEL (CC13)
ANION GAP: 10 meq/L (ref 3–11)
BUN: 27.7 mg/dL — ABNORMAL HIGH (ref 7.0–26.0)
CALCIUM: 9.7 mg/dL (ref 8.4–10.4)
CO2: 23 mEq/L (ref 22–29)
CREATININE: 1.9 mg/dL — AB (ref 0.6–1.1)
Chloride: 108 mEq/L (ref 98–109)
GLUCOSE: 178 mg/dL — AB (ref 70–140)
Potassium: 4.7 mEq/L (ref 3.5–5.1)
Sodium: 141 mEq/L (ref 136–145)

## 2013-10-24 LAB — CBC WITH DIFFERENTIAL/PLATELET
BASO%: 0.9 % (ref 0.0–2.0)
BASOS ABS: 0 10*3/uL (ref 0.0–0.1)
EOS ABS: 0.1 10*3/uL (ref 0.0–0.5)
EOS%: 3.4 % (ref 0.0–7.0)
HEMATOCRIT: 34.6 % — AB (ref 34.8–46.6)
HEMOGLOBIN: 11.5 g/dL — AB (ref 11.6–15.9)
LYMPH%: 34.4 % (ref 14.0–49.7)
MCH: 34.5 pg — ABNORMAL HIGH (ref 25.1–34.0)
MCHC: 33.2 g/dL (ref 31.5–36.0)
MCV: 104 fL — ABNORMAL HIGH (ref 79.5–101.0)
MONO#: 0.5 10*3/uL (ref 0.1–0.9)
MONO%: 12.4 % (ref 0.0–14.0)
NEUT%: 48.9 % (ref 38.4–76.8)
NEUTROS ABS: 1.9 10*3/uL (ref 1.5–6.5)
PLATELETS: 195 10*3/uL (ref 145–400)
RBC: 3.33 10*6/uL — AB (ref 3.70–5.45)
RDW: 13.5 % (ref 11.2–14.5)
WBC: 3.8 10*3/uL — ABNORMAL LOW (ref 3.9–10.3)
lymph#: 1.3 10*3/uL (ref 0.9–3.3)

## 2013-10-24 MED ORDER — SODIUM CHLORIDE 0.9 % IV SOLN
Freq: Once | INTRAVENOUS | Status: AC
  Administered 2013-10-24: 11:00:00 via INTRAVENOUS

## 2013-10-24 NOTE — Telephone Encounter (Signed)
Pt confirmed labs/ov per 09/25 POF, sent msg to add IVF for next wk, gave pt AVS......Marland Kitchen  KJ

## 2013-10-24 NOTE — Patient Instructions (Signed)
Dehydration, Adult Dehydration is when you lose more fluids from the body than you take in. Vital organs like the kidneys, brain, and heart cannot function without a proper amount of fluids and salt. Any loss of fluids from the body can cause dehydration.  CAUSES   Vomiting.  Diarrhea.  Excessive sweating.  Excessive urine output.  Fever. SYMPTOMS  Mild dehydration  Thirst.  Dry lips.  Slightly dry mouth. Moderate dehydration  Very dry mouth.  Sunken eyes.  Skin does not bounce back quickly when lightly pinched and released.  Dark urine and decreased urine production.  Decreased tear production.  Headache. Severe dehydration  Very dry mouth.  Extreme thirst.  Rapid, weak pulse (more than 100 beats per minute at rest).  Cold hands and feet.  Not able to sweat in spite of heat and temperature.  Rapid breathing.  Blue lips.  Confusion and lethargy.  Difficulty being awakened.  Minimal urine production.  No tears. DIAGNOSIS  Your caregiver will diagnose dehydration based on your symptoms and your exam. Blood and urine tests will help confirm the diagnosis. The diagnostic evaluation should also identify the cause of dehydration. TREATMENT  Treatment of mild or moderate dehydration can often be done at home by increasing the amount of fluids that you drink. It is best to drink small amounts of fluid more often. Drinking too much at one time can make vomiting worse. Refer to the home care instructions below. Severe dehydration needs to be treated at the hospital where you will probably be given intravenous (IV) fluids that contain water and electrolytes. HOME CARE INSTRUCTIONS   Ask your caregiver about specific rehydration instructions.  Drink enough fluids to keep your urine clear or pale yellow.  Drink small amounts frequently if you have nausea and vomiting.  Eat as you normally do.  Avoid:  Foods or drinks high in sugar.  Carbonated  drinks.  Juice.  Extremely hot or cold fluids.  Drinks with caffeine.  Fatty, greasy foods.  Alcohol.  Tobacco.  Overeating.  Gelatin desserts.  Wash your hands well to avoid spreading bacteria and viruses.  Only take over-the-counter or prescription medicines for pain, discomfort, or fever as directed by your caregiver.  Ask your caregiver if you should continue all prescribed and over-the-counter medicines.  Keep all follow-up appointments with your caregiver. SEEK MEDICAL CARE IF:  You have abdominal pain and it increases or stays in one area (localizes).  You have a rash, stiff neck, or severe headache.  You are irritable, sleepy, or difficult to awaken.  You are weak, dizzy, or extremely thirsty. SEEK IMMEDIATE MEDICAL CARE IF:   You are unable to keep fluids down or you get worse despite treatment.  You have frequent episodes of vomiting or diarrhea.  You have blood or green matter (bile) in your vomit.  You have blood in your stool or your stool looks black and tarry.  You have not urinated in 6 to 8 hours, or you have only urinated a small amount of very dark urine.  You have a fever.  You faint. MAKE SURE YOU:   Understand these instructions.  Will watch your condition.  Will get help right away if you are not doing well or get worse. Document Released: 01/16/2005 Document Revised: 04/10/2011 Document Reviewed: 09/05/2010 [employer] Patient Information 2015 [employer]. This information is not intended to replace advice given to you by your health care provider. Make sure you discuss any questions you have with your health care   provider.  

## 2013-10-24 NOTE — Progress Notes (Signed)
[place], [place] [address] [place] [address]  DIAGNOSIS:  1. ALL (Philadelphia Chromosome positive +) 2. Patient is s/p an autotransplant and an allogenic transplant and follows with [facility] Transplant Team. 3. Follow up and first encounter with me as Dr Juliann Mule is not here any more.   Chief Complaint  Patient presents with  . Follow-up   CURRENT THERAPY:   Observation. No TKI therapy as of now but there has been some discussion regarding putting her on Maintenance Ponatinib.  INTERVAL HISTORY:  Lynn [name] 48 y.o. 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  She was last seen by Dr Juliann Mule on 09/24/2013. She is being managed by [facility].  She underwent Allogeneic stem cell trasnplant on 02/06/2013.  She was admitted on 01/28/2013 for prepartive regimen of Busulfan/CTX.  Her post transplant course was complicated by C. Difficile positive diarrhea treated with oral vancomycin, pneumonia likely bacterial.  CXR with new patchy opacities treated with zosyn and moxifloxacin and acute kidney injury secondary to dehydration from diarrhea and ATN which improved with hydration.  Her WBC engrafted on 02/24/2013 and platelets on 02/20/2013.    She last saw Dr. Harvel Ricks on 10/03/2013.  From a transplant standpoint, she reports things are going well.  She reports resolution of C. Difficile with flagyl treatment.  She is not having diarrhea.  She does however note intermittent nausea relieved partially with zofran ODT.  She also reports taking ultram prn for generalized pain including left back pain.  She reports increasing her oral hydration and feeling her energy level is improving overall. She is going back to [facility] on 10/31/13 for a Pulmonary function test, immunizations and to see Dr Beaulah Dinning. Her last PCR 190 testing for bcrabl was negative indicating remission status. She is also seeing  a Dr Dwyane Dee here in Endocrinology for managing her diabetes and is currently on a regimen of lantus and novolog. Metformin extended release was not initiated because of her creatinine being high and she was little dehydrated in office today so we did gave her hydration in office today. She said post-transplant she only had a brief trial of Ponatinib around February or so but now we are trying to optimize her health problems before she starts a TKI regimen. Interestingly she had 5 sisters who did not match but then she got a good match through the bone marrow registry.   MEDICAL HISTORY: Past Medical History  Diagnosis Date  . Hypertension   . Brain bleed     07/04/11  . Pulmonary embolism     06/22/11  . Headache(784.0)   . Arthritis   . Anemia   . ALL (acute lymphoblastic leukemia)   . Leukemia   . Diabetes mellitus without complication     INTERIM HISTORY: has Microcytosis; Acute pulmonary embolism; Leukocytosis; Thrombocytopenia; Anemia; Hilar lymphadenopathy; Leukemia, acute lymphoid; Acute lymphoid leukemia; Acute lymphoid leukemia in remission; Transfusion reaction; Viral meningitis; Encounter for antineoplastic chemotherapy; Essential hypertension; History of peripheral stem cell transplant; Subdural hemorrhage; Neutropenia; Fever; Right thyroid nodule; Port-a-cath in place; History of pulmonary embolism; Greenfield filter in place; Pneumonia; CAP (community acquired pneumonia); Type II or unspecified type diabetes mellitus without mention of complication, uncontrolled; Clostridium difficile colitis; Cryptogenic organizing pneumonia; Dehydration; BOOP (bronchiolitis obliterans with organizing pneumonia); and HCAP (healthcare-associated pneumonia) on her problem list.    ALLERGIES:  is allergic to other.  MEDICATIONS: has a current medication list which includes the following prescription(s): acyclovir, amitriptyline, fluconazole, fluticasone-salmeterol, folic acid, insulin aspart, insulin  glargine, magnesium chloride, metformin, montelukast, multivitamin, ondansetron, prochlorperazine, sulfamethoxazole-trimethoprim, tacrolimus, tramadol, and sitagliptin-metformin hcl.  SURGICAL HISTORY:  Past Surgical History  Procedure Laterality Date  . Cesarean section    . Foot surgery    . Foot surgery    . Cesarean section    . Eye surgery    . Craniotomy    . Greenfield filter     PROBLEM LIST:  1. B-cell acute lymphoblastic leukemia, Philadelphia chromosome positive, t(9; 22) initially with 71% blasts seen on bone marrow carried out on Jun 19, 2011. Subsequent bone marrow at [facility] on 06/22/2011 showed 98% blasts and peripheral blood showed 71% blasts. The patient received induction treatment with dasatinib (Sprycel) and Decadron. Her hospital course was complicated by the development of a posterior fossa subdural hematoma requiring a suboccipital craniotomy and evacuation of the hematoma on 07/04/2011. The patient also had neutropenic fever with negative cultures covered with broad-spectrum antibiotics. She had some headaches, vaginal bleeding and transaminitis. Admission to [facility] was from 06/21/2011 through 07/17/2011. Bone marrow carried out on 07/06/2011 showed a hypocellular bone marrow with no evidence for ALL. The patient was enrolled on protocol CALGB 58099. That protocol consisted of treatment with dasatinib along with chemotherapy. A bone marrow carried out on 07/28/2011 showed pan hypoplasia with no evidence for acute leukemia. Cytogenetics were normal. FISH studies were negative for t(9; 22). PCR for small p190 in both the peripheral blood and bone marrow returned negative at 0.000. On 08/08/2011 the patient received high-dose methotrexate with leucovorin rescue. She also received IV vincristine 2.0 mg and CNS prophylaxis with intrathecal methotrexate/hydrocortisone. CSF was negative. On 09/18/2011 bone  marrow biopsy showed no evidence of ALL. PCR for BCR/ABL showed 0.00001 fusion events. It was felt that the patient had achieved a complete hematologic response. On 09/26/2011 mobilization therapy with cytarabine/etoposide was started The patient was admitted to the hospital from 11/14/2011 through 11/28/2011, at which time she received high-dose therapy and her autologous stem cell transplant. She received melphalan on days -2 and days -1. Day 0 was 11/16/2011. The patient also received G-CSF. She had severe mucositis and dysphagia on 11/20/2011 which required morphine by PCA. She had some fevers on 11/23/2011. She was treated with antibiotics for prophylaxis. She had some headaches, but those have subsequently resolved. Venous Dopplers of her lower extremities were done bilaterally because of some swelling of her legs. These were negative for blood clots. It will be recalled that the patient does have an inferior vena cava Greenfield filter that was placed on 07/07/2011 for her prior history of pulmonary emboli. As stated, the patient was discharged from [facility] on October 29th. Betzayda tells me that she was restarted on dasatinib 100 mg daily in mid November. Records from [facility] indicate that bone marrows carried out on 10/16/2011, 11/02/2011, 12/15/2011, 03/08/2012 and 05/31/2012 were negative for any signs of leukemia.  2. Pulmonary emboli involving the right upper and right lower lobes with positive CT chest angiogram on 06/17/2011 and negative Dopplers.  3. Development of subdural hematoma involving the posterior fossa when the patient was hospitalized at [facility], status post suboccipital craniotomy and evacuation of hematoma on 07/04/2011.  4. History of hypertension.  5. Morbid obesity.  6. Arthritis involving the right  hip.  7. History of palpitations.  8. History of migraine headaches.    9. Placement of inferior vena cava Greenfield filter on 07/07/2011.  10. Mild renal insufficiency.  11. Possible 1.1 cm, hypodense lesion in the right thyroid lobe noted on CT angiogram of the chest from 05/13/2012.  12. Right-sided double lumen Port-A-Cath placed at [facility] on 07/12/2011.  13. Diabetes -2 on Insulin regimen managed by Dr Dwyane Dee.  REVIEW OF SYSTEMS:   Constitutional: Denies fevers, chills or abnormal weight loss; reports some periods of shakiness due to low blood glucose. Eyes: Denies blurriness of vision Ears, nose, mouth, throat, and face: Denies mucositis or sore throat Respiratory: Denies cough, dyspnea or wheezes Cardiovascular: Denies palpitation, chest discomfort or lower extremity swelling Gastrointestinal:  Denies nausea, heartburn or change in bowel habits Skin: Reports mild skin hypopigmentation since last transplant.  Lymphatics: Denies new lymphadenopathy or easy bruising Neurological:Denies numbness, tingling or new weaknesses Behavioral/Psych: Mood is stable, no new changes  All other systems were reviewed with the patient and are negative.  PHYSICAL EXAMINATION: ECOG PERFORMANCE STATUS: 0  Blood pressure 124/90, pulse 113, temperature 98.6 F (37 C), temperature source Oral, height 5' 6.25" (1.683 m), weight 236 lb (107.049 kg).  GENERAL:alert, no distress and comfortable; chronically ill appearing woman with alopecia; moderately obese SKIN: skin color, texture, turgor are normal, no rashes or significant lesions EYES: normal, Conjunctiva are pink and non-injected, sclera clear OROPHARYNX:no exudate, no erythema and lips, buccal mucosa, and tongue with dark pigmentation.  NECK: supple, thyroid normal size, non-tender, without nodularity LYMPH:  no palpable lymphadenopathy in the cervical, axillary or supraclavicular LUNGS: clear to auscultation and percussion with normal breathing effort HEART: tachycardic with  regular  rhythm and no murmurs and no lower extremity edema ABDOMEN:abdomen soft, non-tender and normal bowel sounds Musculoskeletal:no cyanosis of digits and no clubbing  NEURO: alert & oriented x 3 with fluent speech, no focal motor/sensory deficits  LABORATORY DATA: Results for orders placed in visit on 10/24/13 (from the past 48 hour(s))  CBC WITH DIFFERENTIAL     Status: Abnormal   Collection Time    10/24/13 10:00 AM      Result Value Ref Range   WBC 3.8 (*) 3.9 - 10.3 10e3/uL   NEUT# 1.9  1.5 - 6.5 10e3/uL   HGB 11.5 (*) 11.6 - 15.9 g/dL   HCT 34.6 (*) 34.8 - 46.6 %   Platelets 195  145 - 400 10e3/uL   MCV 104.0 (*) 79.5 - 101.0 fL   MCH 34.5 (*) 25.1 - 34.0 pg   MCHC 33.2  31.5 - 36.0 g/dL   RBC 3.33 (*) 3.70 - 5.45 10e6/uL   RDW 13.5  11.2 - 14.5 %   lymph# 1.3  0.9 - 3.3 10e3/uL   MONO# 0.5  0.1 - 0.9 10e3/uL   Eosinophils Absolute 0.1  0.0 - 0.5 10e3/uL   Basophils Absolute 0.0  0.0 - 0.1 10e3/uL   NEUT% 48.9  38.4 - 76.8 %   LYMPH% 34.4  14.0 - 49.7 %   MONO% 12.4  0.0 - 14.0 %   EOS% 3.4  0.0 - 7.0 %   BASO% 0.9  0.0 - 2.0 %  BASIC METABOLIC PANEL (UX32)     Status: Abnormal   Collection Time    10/24/13 10:00 AM      Result Value Ref Range   Sodium 141  136 - 145 mEq/L   Potassium 4.7  3.5 -  5.1 mEq/L   Chloride 108  98 - 109 mEq/L   CO2 23  22 - 29 mEq/L   Glucose 178 (*) 70 - 140 mg/dl   BUN 27.7 (*) 7.0 - 26.0 mg/dL   Creatinine 1.9 (*) 0.6 - 1.1 mg/dL   Calcium 9.7  8.4 - 10.4 mg/dL   Anion Gap 10  3 - 11 mEq/L     Labs:  Lab Results  Component Value Date   WBC 3.8* 10/24/2013   HGB 11.5* 10/24/2013   HCT 34.6* 10/24/2013   MCV 104.0* 10/24/2013   PLT 195 10/24/2013   NEUTROABS 1.9 10/24/2013      Chemistry      Component Value Date/Time   NA 141 10/24/2013 1000   NA 139 10/16/2013 1422   K 4.7 10/24/2013 1000   K 4.4 10/16/2013 1422   CL 107 10/16/2013 1422   CL 106 07/09/2012 0949   CO2 23 10/24/2013 1000   CO2 23 10/16/2013 1422   BUN 27.7*  10/24/2013 1000   BUN 25* 10/16/2013 1422   CREATININE 1.9* 10/24/2013 1000   CREATININE 1.9* 10/16/2013 1422      Component Value Date/Time   CALCIUM 9.7 10/24/2013 1000   CALCIUM 9.3 10/16/2013 1422   ALKPHOS 85 09/24/2013 0903   ALKPHOS 172* 08/23/2013 1700   AST 24 09/24/2013 0903   AST 47* 08/23/2013 1700   ALT 37 09/24/2013 0903   ALT 90* 08/23/2013 1700   BILITOT 0.31 09/24/2013 0903   BILITOT <0.2* 08/23/2013 1700     Basic Metabolic Panel:  Recent Labs Lab 10/24/13 1000  NA 141  K 4.7  CO2 23  GLUCOSE 178*  BUN 27.7*  CREATININE 1.9*  CALCIUM 9.7   GFR Estimated Creatinine Clearance: 44.5 ml/min (by C-G formula based on Cr of 1.9). Liver Function Tests: No results found for this basename: AST, ALT, ALKPHOS, BILITOT, PROT, ALBUMIN,  in the last 168 hours CBC:  Recent Labs Lab 10/24/13 1000  WBC 3.8*  NEUTROABS 1.9  HGB 11.5*  HCT 34.6*  MCV 104.0*  PLT 195   Studies:  No results found.   RADIOGRAPHIC STUDIES: 1. CT angiogram of the chest on 06/17/2011 showed right-sided pulmonary emboli. There were mildly prominent mediastinal and right hilar lymph nodes.  2. CT-guided iliac bone aspiration and core biopsy were carried out on 06/19/2011.  3. CT of the head without IV contrast on 07/23/2011 showed that the patient was status post suboccipital craniotomy and right frontal bur hole placement. Otherwise negative noncontrast CT appearance of the brain.  4. Chest x-ray, 2 view, from 10/08/2011 was negative.  5. Chest x-ray, 2 view, on 02/06/2012 was negative.  6. CT angiogram of the chest on 02/06/2012 showed no evidence of pulmonary embolism or any other acute abnormalities. Lungs were clear.  7. Chest x-ray, 2 view, from 03/18/2012, showed no active cardiopulmonary disease.  8. CT scan of the head without IV contrast on 03/18/2012 showed no acute findings. No evidence of intracranial hemorrhage. There was evidence of the previous sub occipital craniotomy and right  frontal burhole.  9. 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 CT scan of the head without IV contrast on 04/25/2012 showed no acute intracranial abnormality.  11. Chest x-ray, 2 view,  on 05/13/2012 showed no acute disease in the chest.  12. CT angiogram of the chest on 05/13/2012 showed no evidence of pulmonary embolism. There was minimal bibasilar atelectasis. There was a vague 1.1-cm hypodensity within the right thyroid lobe. Consider further evaluation with thyroid ultrasound. 13. Chest x-ray, 2 view, from 12/03/2012, demonstrated increased lung markings in the retrocardiac region on the left suggests subsegmental atelectasis or early infiltrate. There is no pleural effusion. No pulmonary parenchymal mass is demonstrated. There is no evidence of CHF. 14. CXR 2 VIEW 08/20/2013 Bilateral hypo inflation with subsegmental atelectasis or early interstitial pneumonia in the left mid and lower lung and in the right infrahilar region      ASSESSMENT: Everardo All 48 y.o. female with a history of ALL (Ph+) s/p 2 transplants came for follow up. She was mildly dehydrated and not feeling well so we gave her IVF in office.   PLAN:   1. C. Difficile colitis (08/20/2013), resolved.  --She was diagnosed with C. Difficile on 07/22 at [facility]. She reports difficulty with continuing her flagyl due to financial concerns.  Her diarrhea eased down from 8 stools daily to less than one with notable improvement in her abdominal discomfort and cramping.  She completed a course of flagyl treatment with resolution of her diarrhea.   2. Mild elevated creatinine. --We counseled the patient to avoid nephrotoxins.  She will continue adequate oral hydration.  Follow with  Endocrinology.  3. Cryptogenic organizing pneumonia.  --She had a VATs on 05/21 consistent with the above at [facility] per patient.  Started prednisone 60 mg daily then tapered to 10 mg daily and planning to start 5 mg daily tomorrow.  She notes improvement in her baseline dyspnea and resolution of her cough. Last xray showed improvement.  4. ALL, relapsed s/p allogeneic stem cell transplant. Day 0 was 02/06/2013.    Counts are stable.   --Patient has experienced relapsed disease as noted above.  She had her  2nd ASCT at [facility].  Her counts demonstrate recovery presently.   --She will continue on prophylaxis medications including acyclovir 800 mg bid, Bactrim DS on Mon, Wed, and Friday.  --She was instructed to contact us should she need any interval labs.  She will forward any neurological records and hematological records to our offices.--She has antiemetics ondansetron 8 mg prn and prochlorperazine 5 mg prn and promethazine 12.5 mg q 6 hours prn.   5. Chronic headaches plus leg tingling. --She is following with neurology.  Continue imitrex 100 mg prn.   6. GERD. --Continue pantoprazole 40 mg daily.   7. History of pulmonary emboli involving the right upper and right lower lobes with positive CT chest angiogram on 06/17/2011 and negative Dopplers. --Placement of inferior vena cava Greenfield filter on 07/07/2011.   8. Development of subdural hematoma involving the posterior fossa when the patient was hospitalized at [facility], status post suboccipital craniotomy and evacuation of hematoma on 07/04/2011.   9. History of hypertension.  -- Observing off medication.   10. Elevated liver enzymes, resolved.  --We will continue to trend and avoid hepatoxins.   11. DM2 --She is being followed by endo.   12. Follow-up --We will plan to see her again in 1 month at which time we will check her CBC, chemistries, LDH.  Previously she was treated according to  protocol CALGB 62229. She is also going to [facility] and will have PFT'S done, immunizations and some discussion regarding  her maintenance therapy.   All questions were answered. The patient knows to call the clinic with any problems, questions or concerns. We can certainly see the patient much sooner if necessary.  I spent 30 minutes counseling the patient face to face. The total time spent in the appointment was 35 minutes.    Bernadene Bell, MD Medical Hematologist/Oncologist [place] Pager: [phone] Office No: [phone]

## 2013-11-12 ENCOUNTER — Ambulatory Visit: Payer: Medicaid Other | Admitting: Endocrinology

## 2013-11-12 ENCOUNTER — Ambulatory Visit (INDEPENDENT_AMBULATORY_CARE_PROVIDER_SITE_OTHER): Payer: Medicaid Other | Admitting: Endocrinology

## 2013-11-12 ENCOUNTER — Encounter: Payer: Self-pay | Admitting: Endocrinology

## 2013-11-12 VITALS — BP 126/78 | HR 119 | Temp 97.7°F | Resp 16 | Ht 66.5 in | Wt 241.2 lb

## 2013-11-12 DIAGNOSIS — E1165 Type 2 diabetes mellitus with hyperglycemia: Secondary | ICD-10-CM

## 2013-11-12 DIAGNOSIS — IMO0002 Reserved for concepts with insufficient information to code with codable children: Secondary | ICD-10-CM

## 2013-11-12 DIAGNOSIS — N289 Disorder of kidney and ureter, unspecified: Secondary | ICD-10-CM

## 2013-11-12 NOTE — Patient Instructions (Signed)
Lantus 12 units daily and try doing that at 6-7 pm

## 2013-11-12 NOTE — Progress Notes (Signed)
Patient ID: Lynn [name], female   DOB: 1965/12/11, 48 y.o.   MRN: [id number]    Reason for Appointment: Followup for Type 2 Diabetes  Referring physician: Donnie Coffin  History of Present Illness:          Diagnosis: Type 2 diabetes mellitus, date of diagnosis: 2013        Past history: She weighed nearly 300 lb in 2013 around that time her diabetes was diagnosed Although her A1c at that time was reported at 7.4 she does not remember being told about diabetes and no treatment was given In 5/15 when she was getting steroids for her bone marrow transplant her blood sugar was over 400 She was started on insulin and had been taking a basal bolus insulin regimen subsequently  Recent history:   On her initial consultation she was taking 20 mg prednisone and was given a basal bolus insulin regimen On her last visit because of her getting off prednisone and blood sugars starting to improve she was given a prescription for Janumet XR and told to taper off her insulin However her renal functions were abnormal and she was told to continue insulin instead of oral agents She is still taking about the same amount of insulin despite not taking any prednisone Currently fasting blood sugars are relatively high Postprandial readings appear to be fairly good but has had a little variability especially last month A1c last month was upper normal She takes her Lantus is in the morning with her breakfast and is generally eating 2 meals a day       Oral hypoglycemic drugs the patient is taking are: None    Side effects from medications have been: Diarrhea from Metformin 4 daily INSULIN regimen is described as: NovoLog 8-10 units with meals, Lantus  10 in a.m.  Glucose monitoring:  done one time a day         Glucometer:  Accu-Chek      PREMEAL Breakfast  PCP   PCs  Bedtime Overall  Glucose range:  88-159   100-135   116-151   110, 67    Mean/median:      121    Glycemic control:  Lab Results   Component Value Date   HGBA1C 6.5 10/16/2013   HGBA1C 7.4* 05/30/2013   HGBA1C 7.4* 06/17/2011   Lab Results  Component Value Date   CREATININE 1.9* 10/24/2013    Self-care: The diet that the patient has been following is: tries to limit  Portions, may have some sweets    Meals: 2 meals per day. Light supper usually,  eating eggs or cereal with breakfast at 10 am.   Exercise: none except housework, joint pains        Dietician visit: Most recent: 5/15 at the hospital              Compliance with the medical regimen: Fair Retinal exam: Most recent:.2013     Weight history: Wt Readings from Last 3 Encounters:  11/12/13 241 lb 3.2 oz (109.408 kg)  10/24/13 236 lb (107.049 kg)  10/15/13 236 lb 9.6 oz (107.321 kg)      Medication List       This list is accurate as of: 11/12/13  9:26 PM.  Always use your most recent med list.               acyclovir 800 MG tablet  Commonly known as:  ZOVIRAX  Take 1 tablet (800 mg total) by mouth  2 (two) times daily. Marland Kitchen     ADVAIR DISKUS 250-50 MCG/DOSE Aepb  Generic drug:  Fluticasone-Salmeterol  Inhale 1 puff into the lungs as needed.     amitriptyline 10 MG tablet  Commonly known as:  ELAVIL  Take 1 tablet (10 mg total) by mouth at bedtime.     fluconazole 200 MG tablet  Commonly known as:  DIFLUCAN  Take 400 mg by mouth daily.     folic acid 1 MG tablet  Commonly known as:  FOLVITE  Take 1 mg by mouth daily.     insulin aspart 100 UNIT/ML FlexPen  Commonly known as:  NOVOLOG  Inject 10 Units into the skin 3 (three) times daily with meals. (If you have eaten more than 50% of your meal.)     Insulin Glargine 100 UNIT/ML Solostar Pen  Commonly known as:  LANTUS  Inject 10 Units into the skin every morning. Sliding scale.     montelukast 5 MG chewable tablet  Commonly known as:  SINGULAIR  Chew 5 mg by mouth at bedtime.     multivitamin tablet  Take 1 tablet by mouth daily.     ondansetron 4 MG disintegrating tablet   Commonly known as:  ZOFRAN ODT  Take 1 tablet (4 mg total) by mouth every 8 (eight) hours as needed for nausea.     prochlorperazine 5 MG tablet  Commonly known as:  COMPAZINE  Take 5 mg by mouth every 6 (six) hours as needed (nausea).     SLOW-MAG 64 MG Tbec SR tablet  Generic drug:  magnesium chloride  Take 2 tablets by mouth 3 (three) times daily.     sulfamethoxazole-trimethoprim 800-160 MG per tablet  Commonly known as:  BACTRIM DS  Take 1 tablet by mouth every Monday, Wednesday, and Friday. Takes 1 tablet Monday-Wednesday-Friday     tacrolimus 1 MG capsule  Commonly known as:  PROGRAF  Take 0.5 mg by mouth 2 (two) times daily. Patient is taking a total of 2 mg daily     traMADol 50 MG tablet  Commonly known as:  ULTRAM  Take 1 tablet (50 mg total) by mouth every 6 (six) hours as needed for moderate pain.        Allergies:  Allergies  Allergen Reactions  . Other Rash    Please do not use clear tegaderm dressings as patient states they pull her skin off.  Mepilex is fine.    Past Medical History  Diagnosis Date  . Hypertension   . Brain bleed     07/04/11  . Pulmonary embolism     06/22/11  . Headache(784.0)   . Arthritis   . Anemia   . ALL (acute lymphoblastic leukemia)   . Leukemia   . Diabetes mellitus without complication     Past Surgical History  Procedure Laterality Date  . Cesarean section    . Foot surgery    . Foot surgery    . Cesarean section    . Eye surgery    . Craniotomy    . [place] filter      Family History  Problem Relation Age of Onset  . Pancreatic cancer Father   . Cancer Mother     colon  . Pancreatic cancer Mother   . Diabetes Neg Hx     Social History:  reports that she has never smoked. She has never used smokeless tobacco. She reports that she does not drink alcohol or use illicit drugs.  Review of Systems       Lipids: Unknown       No results found for this basename: CHOL,  HDL,  LDLCALC,  LDLDIRECT,   TRIG,  CHOLHDL   Some leg weakness in knees  Gets short of breath on exertion     Thyroid:  No  unusual fatigue. She had a thyroid biopsy in 6/14 for a left-sided thyroid nodule which was benign  Physical Examination:  BP 126/78  Pulse 119  Temp(Src) 97.7 F (36.5 C)  Resp 16  Ht 5' 6.5" (1.689 m)  Wt 241 lb 3.2 oz (109.408 kg)  BMI 38.35 kg/m2  SpO2 96%         ASSESSMENT:  Diabetes type 2, uncontrolled  Her blood sugars are fairly well controlled overall She has been continued on basal bolus insulin regimen because of her renal insufficiency, metformin was stopped She is comfortable with doing insulin although may be also a candidate for GLP-1 drug like Victoza since she is not on prednisone  Renal insufficiency: Recent creatinine is 1.99 and she was told that this was related to her taking Prograf, dose has been reduced   PLAN:   Start taking Lantus in the evening and use 12 units  Keep adjusting mealtime dose based on meal size and call if blood sugars are more difficult control   , 11/12/2013, 9:26 PM   Note: This office note was prepared with Dragon voice recognition system technology. Any transcriptional errors that result from this process are unintentional.

## 2013-11-13 ENCOUNTER — Encounter: Payer: Self-pay | Admitting: Neurology

## 2013-11-13 ENCOUNTER — Telehealth: Payer: Self-pay | Admitting: Neurology

## 2013-11-13 NOTE — Telephone Encounter (Signed)
Left message for patient regarding moving appointment time on 03/18/14 per Dr. Clydene Fake schedule, printed and mailed letter with new appointment time.

## 2013-11-18 ENCOUNTER — Telehealth: Payer: Self-pay | Admitting: Hematology

## 2013-11-18 NOTE — Telephone Encounter (Signed)
Confirm appt r/s from 11/21/13 to 12/01/13.

## 2013-11-21 ENCOUNTER — Other Ambulatory Visit: Payer: Medicaid Other

## 2013-11-21 ENCOUNTER — Ambulatory Visit: Payer: Medicaid Other

## 2013-12-01 ENCOUNTER — Ambulatory Visit (HOSPITAL_BASED_OUTPATIENT_CLINIC_OR_DEPARTMENT_OTHER): Payer: Medicare Other | Admitting: Hematology

## 2013-12-01 ENCOUNTER — Telehealth: Payer: Self-pay | Admitting: Hematology

## 2013-12-01 ENCOUNTER — Other Ambulatory Visit (HOSPITAL_BASED_OUTPATIENT_CLINIC_OR_DEPARTMENT_OTHER): Payer: Medicare Other

## 2013-12-01 ENCOUNTER — Encounter: Payer: Self-pay | Admitting: Hematology

## 2013-12-01 ENCOUNTER — Other Ambulatory Visit: Payer: Self-pay | Admitting: *Deleted

## 2013-12-01 ENCOUNTER — Non-Acute Institutional Stay (HOSPITAL_COMMUNITY)
Admission: AD | Admit: 2013-12-01 | Discharge: 2013-12-01 | Disposition: A | Discharge status: Home / Self Care | Payer: Medicare Other | Source: Ambulatory Visit | Attending: Hematology | Admitting: Hematology

## 2013-12-01 ENCOUNTER — Ambulatory Visit (HOSPITAL_BASED_OUTPATIENT_CLINIC_OR_DEPARTMENT_OTHER): Payer: Medicare Other

## 2013-12-01 ENCOUNTER — Other Ambulatory Visit: Payer: Self-pay

## 2013-12-01 VITALS — BP 112/76 | HR 106 | Temp 98.7°F | Resp 18 | Ht 66.5 in | Wt 244.9 lb

## 2013-12-01 DIAGNOSIS — C9100 Acute lymphoblastic leukemia not having achieved remission: Secondary | ICD-10-CM

## 2013-12-01 DIAGNOSIS — R0602 Shortness of breath: Secondary | ICD-10-CM

## 2013-12-01 DIAGNOSIS — C9102 Acute lymphoblastic leukemia, in relapse: Secondary | ICD-10-CM

## 2013-12-01 DIAGNOSIS — R0600 Dyspnea, unspecified: Secondary | ICD-10-CM

## 2013-12-01 DIAGNOSIS — E860 Dehydration: Secondary | ICD-10-CM

## 2013-12-01 LAB — COMPREHENSIVE METABOLIC PANEL (CC13)
ALK PHOS: 199 U/L — AB (ref 40–150)
ALT: 67 U/L — AB (ref 0–55)
AST: 57 U/L — ABNORMAL HIGH (ref 5–34)
Albumin: 3.4 g/dL — ABNORMAL LOW (ref 3.5–5.0)
Anion Gap: 9 mEq/L (ref 3–11)
BILIRUBIN TOTAL: 0.3 mg/dL (ref 0.20–1.20)
BUN: 12.4 mg/dL (ref 7.0–26.0)
CO2: 25 mEq/L (ref 22–29)
Calcium: 9.3 mg/dL (ref 8.4–10.4)
Chloride: 109 mEq/L (ref 98–109)
Creatinine: 1.3 mg/dL — ABNORMAL HIGH (ref 0.6–1.1)
GLUCOSE: 132 mg/dL (ref 70–140)
Potassium: 3.5 mEq/L (ref 3.5–5.1)
Sodium: 143 mEq/L (ref 136–145)
Total Protein: 5.9 g/dL — ABNORMAL LOW (ref 6.4–8.3)

## 2013-12-01 LAB — MAGNESIUM (CC13): Magnesium: 2.1 mg/dl (ref 1.5–2.5)

## 2013-12-01 LAB — CBC WITH DIFFERENTIAL/PLATELET
BASO%: 0.2 % (ref 0.0–2.0)
Basophils Absolute: 0 10*3/uL (ref 0.0–0.1)
EOS%: 4.5 % (ref 0.0–7.0)
Eosinophils Absolute: 0.2 10*3/uL (ref 0.0–0.5)
HCT: 29.9 % — ABNORMAL LOW (ref 34.8–46.6)
HGB: 10 g/dL — ABNORMAL LOW (ref 11.6–15.9)
LYMPH%: 21.5 % (ref 14.0–49.7)
MCH: 33.6 pg (ref 25.1–34.0)
MCHC: 33.4 g/dL (ref 31.5–36.0)
MCV: 100.3 fL (ref 79.5–101.0)
MONO#: 0.6 10*3/uL (ref 0.1–0.9)
MONO%: 10.8 % (ref 0.0–14.0)
NEUT#: 3.2 10*3/uL (ref 1.5–6.5)
NEUT%: 63 % (ref 38.4–76.8)
Platelets: 189 10*3/uL (ref 145–400)
RBC: 2.98 10*6/uL — AB (ref 3.70–5.45)
RDW: 12.7 % (ref 11.2–14.5)
WBC: 5.1 10*3/uL (ref 3.9–10.3)
lymph#: 1.1 10*3/uL (ref 0.9–3.3)

## 2013-12-01 LAB — D-DIMER, QUANTITATIVE: D-Dimer, Quant: 0.8 ug/mL-FEU — ABNORMAL HIGH (ref 0.00–0.48)

## 2013-12-01 LAB — PHOSPHORUS: PHOSPHORUS: 4.5 mg/dL (ref 2.3–4.6)

## 2013-12-01 MED ORDER — LORAZEPAM 0.5 MG PO TABS: 0.5000 mg | ORAL_TABLET | Freq: Two times a day (BID) | ORAL | Status: DC

## 2013-12-01 MED ORDER — HEPARIN SOD (PORK) LOCK FLUSH 100 UNIT/ML IV SOLN
500.0000 [IU] | Freq: Every day | INTRAVENOUS | Status: DC | PRN
  Administered 2013-12-01: 500 [IU] via INTRAVENOUS
  Filled 2013-12-01: qty 5

## 2013-12-01 MED ORDER — LORAZEPAM 0.5 MG PO TABS: 0.5000 mg | ORAL_TABLET | Freq: Three times a day (TID) | ORAL | Status: DC

## 2013-12-01 MED ORDER — SODIUM CHLORIDE 0.9 % IV SOLN
Freq: Once | INTRAVENOUS | Status: AC
  Administered 2013-12-01: 15:00:00 via INTRAVENOUS

## 2013-12-01 NOTE — Telephone Encounter (Signed)
LM to confirm appt d/t for 12/02/13 CT scan and sickle cell clinic.

## 2013-12-01 NOTE — Progress Notes (Signed)
Pt arrived from [facility]. Pt diagnosis Leukemia, acute lymphoid. 1L NS bolus administered per MD order. Pt tolerated well. Rt chest Portacath flushed and packed with heparin per protocol and deaccessed, pt tolerated well.

## 2013-12-01 NOTE — Progress Notes (Signed)
[facility] ONCOLOGY OFFICE PROGRESS NOTE Date of visit: 12/01/2013  Donnie Coffin, [address] [place] [address]  DIAGNOSIS:  1. ALL (Philadelphia Chromosome positive +) 2. Patient is s/p an autotransplant and an allogenic transplant and follows with [facility] Transplant Team. 3. Follow up visit patient last seen here on 10/24/2013.   Chief Complaint  Patient presents with  . Follow-up   CURRENT THERAPY:   Observation. No TKI therapy as of now but there has been some discussion regarding putting her on Maintenance Ponatinib.  INTERVAL HISTORY:  Lynn [name] 48 y.o. 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  She was last seen by me on 10/24/2013.  She is being managed by [facility].  She underwent Allogeneic stem cell trasnplant on 02/06/2013.  She was admitted on 01/28/2013 for prepartive regimen of Busulfan/CTX.  Her post transplant course was complicated by C. Difficile positive diarrhea treated with oral vancomycin, pneumonia likely bacterial.  CXR with new patchy opacities treated with zosyn and moxifloxacin and acute kidney injury secondary to dehydration from diarrhea and ATN which improved with hydration.  Her WBC engrafted on 02/24/2013 and platelets on 02/20/2013.    She last saw Dr. Harvel Ricks on 10/03/2013.  From a transplant standpoint, she reports things are going well.  She reports resolution of C. Difficile with flagyl treatment.  She is not having diarrhea.  She does however note intermittent nausea relieved partially with zofran ODT.  She also reports taking ultram prn for generalized pain including left back pain.  She reports increasing her oral hydration and feeling her energy level is improving overall. She is going back to [facility] on 10/31/13 for a Pulmonary function test, immunizations and to see Dr Beaulah Dinning. Her last PCR 190 testing for bcrabl was negative indicating remission status. She is also  seeing a Dr Dwyane Dee here in Endocrinology for managing her diabetes and is currently on a regimen of lantus and novolog. Metformin extended release was not initiated because of her creatinine being high and she was little dehydrated in office today so we did gave her hydration in office today. She said post-transplant she only had a brief trial of Ponatinib around February or so but now we are trying to optimize her health problems before she starts a TKI regimen. Interestingly she had 5 sisters who did not match but then she got a good match through the bone marrow registry.   Today patient was complaining of having some low-grade temperature for a few days. The highest recorded temperature was 100.6. There was no associated nausea vomiting or diarrhea. Patient also said when she inhales deeply she feels the pain in the center of the chest. She also has some exertional dyspnea and at times she feels her heart rate is going up and she gets lightheaded. She is a prior history of pulmonary emboli and at one point she was on Coumadin but she has had subdural hematoma and it was stopped. She also has an IVC filter I ordered a d-dimer today which came back as 0.80 elevated at creatinine today was 1.3. I'm giving her hydration today and tomorrow and ordering a CT angiogram to be done tomorrow to rule out pulmonary embolism.  MEDICAL HISTORY: Past Medical History  Diagnosis Date  . Hypertension   . Brain bleed     07/04/11  . Pulmonary embolism     06/22/11  . Headache(784.0)   .  Arthritis   . Anemia   . ALL (acute lymphoblastic leukemia)   . Leukemia   . Diabetes mellitus without complication     INTERIM HISTORY: has Microcytosis; Acute pulmonary embolism; Leukocytosis; Thrombocytopenia; Anemia; Hilar lymphadenopathy; Leukemia, acute lymphoid; Acute lymphoid leukemia; Acute lymphoid leukemia in remission; Transfusion reaction; Viral meningitis; Encounter for antineoplastic chemotherapy; Essential  hypertension; History of peripheral stem cell transplant; Subdural hemorrhage; Neutropenia; Fever; Right thyroid nodule; Port-a-cath in place; History of pulmonary embolism; Greenfield filter in place; Pneumonia; CAP (community acquired pneumonia); Type II or unspecified type diabetes mellitus without mention of complication, uncontrolled; Clostridium difficile colitis; Cryptogenic organizing pneumonia; Dehydration; BOOP (bronchiolitis obliterans with organizing pneumonia); and HCAP (healthcare-associated pneumonia) on her problem list.    ALLERGIES:  is allergic to other.  MEDICATIONS: has a current medication list which includes the following prescription(s): acyclovir, amitriptyline, fluconazole, fluticasone-salmeterol, folic acid, insulin aspart, insulin glargine, magnesium chloride, montelukast, multivitamin, ondansetron, prochlorperazine, sulfamethoxazole-trimethoprim, tacrolimus, tacrolimus, tramadol, and lorazepam, and the following Facility-Administered Medications: heparin lock flush.  SURGICAL HISTORY:  Past Surgical History  Procedure Laterality Date  . Cesarean section    . Foot surgery    . Foot surgery    . Cesarean section    . Eye surgery    . Craniotomy    . Greenfield filter     PROBLEM LIST:  1. B-cell acute lymphoblastic leukemia, Philadelphia chromosome positive, t(9; 22) initially with 71% blasts seen on bone marrow carried out on Jun 19, 2011. Subsequent bone marrow at [facility] on 06/22/2011 showed 98% blasts and peripheral blood showed 71% blasts. The patient received induction treatment with dasatinib (Sprycel) and Decadron. Her hospital course was complicated by the development of a posterior fossa subdural hematoma requiring a suboccipital craniotomy and evacuation of the hematoma on 07/04/2011. The patient also had neutropenic fever with negative cultures covered with broad-spectrum antibiotics. She had some headaches, vaginal  bleeding and transaminitis. Admission to [facility] was from 06/21/2011 through 07/17/2011. Bone marrow carried out on 07/06/2011 showed a hypocellular bone marrow with no evidence for ALL. The patient was enrolled on protocol CALGB 38101. That protocol consisted of treatment with dasatinib along with chemotherapy. A bone marrow carried out on 07/28/2011 showed pan hypoplasia with no evidence for acute leukemia. Cytogenetics were normal. FISH studies were negative for t(9; 22). PCR for small p190 in both the peripheral blood and bone marrow returned negative at 0.000. On 08/08/2011 the patient received high-dose methotrexate with leucovorin rescue. She also received IV vincristine 2.0 mg and CNS prophylaxis with intrathecal methotrexate/hydrocortisone. CSF was negative. On 09/18/2011 bone marrow biopsy showed no evidence of ALL. PCR for BCR/ABL showed 0.00001 fusion events. It was felt that the patient had achieved a complete hematologic response. On 09/26/2011 mobilization therapy with cytarabine/etoposide was started The patient was admitted to the hospital from 11/14/2011 through 11/28/2011, at which time she received high-dose therapy and her autologous stem cell transplant. She received melphalan on days -2 and days -1. Day 0 was 11/16/2011. The patient also received G-CSF. She had severe mucositis and dysphagia on 11/20/2011 which required morphine by PCA. She had some fevers on 11/23/2011. She was treated with antibiotics for prophylaxis. She had some headaches, but those have subsequently resolved. Venous Dopplers of her lower extremities were done bilaterally because of some swelling of her legs. These were negative for blood clots. It will be recalled that the patient does have an inferior vena cava Greenfield filter that  was placed on 07/07/2011 for her prior history of pulmonary emboli. As stated, the patient was discharged from [facility] on October 29th. Chetara tells me that she was restarted on dasatinib 100 mg daily in mid November. Records from [facility] indicate that bone marrows carried out on 10/16/2011, 11/02/2011, 12/15/2011, 03/08/2012 and 05/31/2012 were negative for any signs of leukemia.  2. Pulmonary emboli involving the right upper and right lower lobes with positive CT chest angiogram on 06/17/2011 and negative Dopplers.  3. Development of subdural hematoma involving the posterior fossa when the patient was hospitalized at [facility], status post suboccipital craniotomy and evacuation of hematoma on 07/04/2011.  4. History of hypertension.  5. Morbid obesity.  6. Arthritis involving the right hip.  7. History of palpitations.  8. History of migraine headaches.  9. Placement of inferior vena cava Greenfield filter on 07/07/2011.  10. Mild renal insufficiency.  11. Possible 1.1 cm, hypodense lesion in the right thyroid lobe noted on CT angiogram of the chest from 05/13/2012.  12. Right-sided double lumen Port-A-Cath placed at [facility] on 07/12/2011.  13. Diabetes -2 on Insulin regimen managed by Dr Dwyane Dee.  REVIEW OF SYSTEMS:   Constitutional: Denies fevers, chills or abnormal weight loss; reports some periods of shakiness due to low blood glucose. Eyes: Denies blurriness of vision Ears, nose, mouth, throat, and face: Denies mucositis or sore throat Respiratory: Denies cough, dyspnea or wheezes Cardiovascular: Denies palpitation, chest discomfort or lower extremity swelling Gastrointestinal:  Denies nausea, heartburn or change in bowel habits Skin: Reports mild skin hypopigmentation since last transplant.  Lymphatics: Denies new lymphadenopathy or easy bruising Neurological:Denies numbness, tingling or new weaknesses Behavioral/Psych: Mood is stable, no new changes  All other systems were reviewed  with the patient and are negative.  PHYSICAL EXAMINATION: ECOG PERFORMANCE STATUS: 0  Blood pressure 112/76, pulse 106, temperature 98.7 F (37.1 C), temperature source Oral, resp. rate 18, height 5' 6.5" (1.689 m), weight 244 lb 14.4 oz (111.086 kg), SpO2 100 %.  GENERAL:alert, no distress and comfortable; chronically ill appearing woman with alopecia; moderately obese SKIN: skin color, texture, turgor are normal, no rashes or significant lesions EYES: normal, Conjunctiva are pink and non-injected, sclera clear OROPHARYNX:no exudate, no erythema and lips, buccal mucosa, and tongue with dark pigmentation.  NECK: supple, thyroid normal size, non-tender, without nodularity LYMPH:  no palpable lymphadenopathy in the cervical, axillary or supraclavicular LUNGS: clear to auscultation and percussion with normal breathing effort HEART: tachycardic with regular  rhythm and no murmurs and no lower extremity edema ABDOMEN:abdomen soft, non-tender and normal bowel sounds Musculoskeletal:no cyanosis of digits and no clubbing  NEURO: alert & oriented x 3 with fluent speech, no focal motor/sensory deficits  LABORATORY DATA: Results for orders placed or performed in visit on 12/01/13 (from the past 48 hour(s))  CBC with Differential     Status: Abnormal   Collection Time: 12/01/13 12:38 PM  Result Value Ref Range   WBC 5.1 3.9 - 10.3 10e3/uL   NEUT# 3.2 1.5 - 6.5 10e3/uL   HGB 10.0 (L) 11.6 - 15.9 g/dL   HCT 29.9 (L) 34.8 - 46.6 %   Platelets 189 145 - 400 10e3/uL   MCV 100.3 79.5 - 101.0 fL   MCH 33.6 25.1 - 34.0 pg   MCHC 33.4 31.5 - 36.0 g/dL   RBC 2.98 (L) 3.70 - 5.45 10e6/uL   RDW  12.7 11.2 - 14.5 %   lymph# 1.1 0.9 - 3.3 10e3/uL   MONO# 0.6 0.1 - 0.9 10e3/uL   Eosinophils Absolute 0.2 0.0 - 0.5 10e3/uL   Basophils Absolute 0.0 0.0 - 0.1 10e3/uL   NEUT% 63.0 38.4 - 76.8 %   LYMPH% 21.5 14.0 - 49.7 %   MONO% 10.8 0.0 - 14.0 %   EOS% 4.5 0.0 - 7.0 %   BASO% 0.2 0.0 - 2.0 %  Phosphorus      Status: None   Collection Time: 12/01/13 12:38 PM  Result Value Ref Range   Phosphorus 4.5 2.3 - 4.6 mg/dL  Comprehensive metabolic panel (Cmet) - [facility]     Status: Abnormal   Collection Time: 12/01/13 12:39 PM  Result Value Ref Range   Sodium 143 136 - 145 mEq/L   Potassium 3.5 3.5 - 5.1 mEq/L   Chloride 109 98 - 109 mEq/L   CO2 25 22 - 29 mEq/L   Glucose 132 70 - 140 mg/dl   BUN 12.4 7.0 - 26.0 mg/dL   Creatinine 1.3 (H) 0.6 - 1.1 mg/dL   Total Bilirubin 0.30 0.20 - 1.20 mg/dL   Alkaline Phosphatase 199 (H) 40 - 150 U/L   AST 57 (H) 5 - 34 U/L   ALT 67 (H) 0 - 55 U/L   Total Protein 5.9 (L) 6.4 - 8.3 g/dL   Albumin 3.4 (L) 3.5 - 5.0 g/dL   Calcium 9.3 8.4 - 10.4 mg/dL   Anion Gap 9 3 - 11 mEq/L  Magnesium     Status: None   Collection Time: 12/01/13 12:39 PM  Result Value Ref Range   Magnesium 2.1 1.5 - 2.5 mg/dl     Labs:  Lab Results  Component Value Date   WBC 5.1 12/01/2013   HGB 10.0* 12/01/2013   HCT 29.9* 12/01/2013   MCV 100.3 12/01/2013   PLT 189 12/01/2013   NEUTROABS 3.2 12/01/2013      Chemistry      Component Value Date/Time   NA 143 12/01/2013 1239   NA 139 10/16/2013 1422   K 3.5 12/01/2013 1239   K 4.4 10/16/2013 1422   CL 107 10/16/2013 1422   CL 106 07/09/2012 0949   CO2 25 12/01/2013 1239   CO2 23 10/16/2013 1422   BUN 12.4 12/01/2013 1239   BUN 25* 10/16/2013 1422   CREATININE 1.3* 12/01/2013 1239   CREATININE 1.9* 10/16/2013 1422      Component Value Date/Time   CALCIUM 9.3 12/01/2013 1239   CALCIUM 9.3 10/16/2013 1422   ALKPHOS 199* 12/01/2013 1239   ALKPHOS 172* 08/23/2013 1700   AST 57* 12/01/2013 1239   AST 47* 08/23/2013 1700   ALT 67* 12/01/2013 1239   ALT 90* 08/23/2013 1700   BILITOT 0.30 12/01/2013 1239   BILITOT <0.2* 08/23/2013 1700     Basic Metabolic Panel:  Recent Labs Lab 12/01/13 1238 12/01/13 1239  NA  --  143  K  --  3.5  CO2  --  25  GLUCOSE  --  132  BUN  --  12.4  CREATININE  --  1.3*    CALCIUM  --  9.3  MG  --  2.1  PHOS 4.5  --    GFR Estimated Creatinine Clearance: 66.7 mL/min (by C-G formula based on Cr of 1.3). Liver Function Tests:  Recent Labs Lab 12/01/13 1239  AST 57*  ALT 67*  ALKPHOS 199*  BILITOT 0.30  PROT 5.9*  ALBUMIN  3.4*   CBC:  Recent Labs Lab 12/01/13 1238  WBC 5.1  NEUTROABS 3.2  HGB 10.0*  HCT 29.9*  MCV 100.3  PLT 189   D-dimer 0.80 high Studies:    RADIOGRAPHIC STUDIES: 1. CT angiogram of the chest on 06/17/2011 showed right-sided pulmonary emboli. There were mildly prominent mediastinal and right hilar lymph nodes.  2. CT-guided iliac bone aspiration and core biopsy were carried out on 06/19/2011.  3. CT of the head without IV contrast on 07/23/2011 showed that the patient was status post suboccipital craniotomy and right frontal bur hole placement. Otherwise negative noncontrast CT appearance of the brain.  4. Chest x-ray, 2 view, from 10/08/2011 was negative.  5. Chest x-ray, 2 view, on 02/06/2012 was negative.  6. CT angiogram of the chest on 02/06/2012 showed no evidence of pulmonary embolism or any other acute abnormalities. Lungs were clear.  7. Chest x-ray, 2 view, from 03/18/2012, showed no active cardiopulmonary disease.  8. CT scan of the head without IV contrast on 03/18/2012 showed no acute findings. No evidence of intracranial hemorrhage. There was evidence of the previous sub occipital craniotomy and right frontal burhole.  9. 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 CT scan of the head without IV contrast on 04/25/2012 showed no acute intracranial abnormality.  11. Chest x-ray, 2 view, on 05/13/2012 showed no acute disease in the chest.  12. CT angiogram of the chest on  05/13/2012 showed no evidence of pulmonary embolism. There was minimal bibasilar atelectasis. There was a vague 1.1-cm hypodensity within the right thyroid lobe. Consider further evaluation with thyroid ultrasound. 13. Chest x-ray, 2 view, from 12/03/2012, demonstrated increased lung markings in the retrocardiac region on the left suggests subsegmental atelectasis or early infiltrate. There is no pleural effusion. No pulmonary parenchymal mass is demonstrated. There is no evidence of CHF. 14. CXR 2 VIEW 08/20/2013 Bilateral hypo inflation with subsegmental atelectasis or early interstitial pneumonia in the left mid and lower lung and in the right infrahilar region      ASSESSMENT: Everardo All 48 y.o. female with a history of ALL (Ph+) s/p 2 transplants came for follow up.She was mildly dehydrated and not feeling well so we gave her IVF in office.   PLAN:   1. C. Difficile colitis (08/20/2013), resolved.  --She was diagnosed with C. Difficile on 07/22 at [facility]. She reports difficulty with continuing her flagyl due to financial concerns.  Her diarrhea eased down from 8 stools daily to less than one with notable improvement in her abdominal discomfort and cramping.  She completed a course of flagyl treatment with resolution of her diarrhea.   2. Mild elevated creatinine. --We counseled the patient to avoid nephrotoxins.  She will continue adequate oral hydration.  Follow with Endocrinology.she getting a CT scan with contrast tomorrow and also get hydration after that.  3. Cryptogenic organizing pneumonia.  --She had a VATs on 05/21 consistent with the above at [facility] per patient.  Started prednisone 60 mg daily then tapered to 10 mg daily and planning to start 5 mg daily tomorrow.  She notes improvement in her baseline dyspnea and resolution of her cough. Last xray showed improvement.  4. ALL, relapsed s/p allogeneic stem cell transplant. Day 0 was 02/06/2013.  Counts are stable.     --Patient has experienced relapsed disease as noted above.  She had her  2nd ASCT at [facility].  Her counts demonstrate recovery presently.   --She will continue on prophylaxis medications including acyclovir 800 mg bid, Bactrim DS on Mon, Wed, and Friday.  --She was instructed to contact us should she need any interval labs.  She will forward any neurological records and hematological records to our offices.--She has antiemetics ondansetron 8 mg prn and prochlorperazine 5 mg prn and promethazine 12.5 mg q 6 hours prn.   5. Chronic headaches plus leg tingling. --She is following with neurology.  Continue imitrex 100 mg prn.   6. GERD. --Continue pantoprazole 40 mg daily.   7. History of pulmonary emboli involving the right upper and right lower lobes with positive CT chest angiogram on 06/17/2011 and negative Dopplers. --Placement of inferior vena cava Greenfield filter on 07/07/2011. CT imaging gram tomorrow as her d-dimer is elevated and she is having some breathing problems which may or may not be related to a new PE. If she does have a pulmonary emboli, I will call [facility] and try to get an opinion regarding anticoagulation.  8. Development of subdural hematoma involving the posterior fossa when the patient was hospitalized at [facility], status post suboccipital craniotomy and evacuation of hematoma on 07/04/2011.   9. History of hypertension.  -- Observing off medication.   10. Elevated liver enzymes, resolved.  --We will continue to trend and avoid hepatoxins.   11. DM2 --She is being followed by endo.   12. Follow-up --We will plan to see her again in 1 month at which time we will check her CBC, chemistries, LDH.  Previously she was treated according to protocol CALGB 09983. She is also going to [facility] and will have PFT'S done, immunizations and some discussion regarding her maintenance therapy.   All questions were answered. The  patient knows to call the clinic with any problems, questions or concerns. We can certainly see the patient much sooner if necessary.  I spent 30 minutes counseling the patient face to face. The total time spent in the appointment was 35 minutes.    Bernadene Bell, MD Medical Hematologist/Oncologist [place] Pager: [phone] Office No: [phone]

## 2013-12-02 ENCOUNTER — Encounter (HOSPITAL_COMMUNITY): Payer: Self-pay

## 2013-12-02 ENCOUNTER — Non-Acute Institutional Stay (HOSPITAL_COMMUNITY)
Admission: AD | Admit: 2013-12-02 | Discharge: 2013-12-02 | Disposition: A | Discharge status: Home / Self Care | Payer: Medicare Other | Source: Ambulatory Visit | Attending: Hematology | Admitting: Hematology

## 2013-12-02 ENCOUNTER — Ambulatory Visit (HOSPITAL_COMMUNITY)
Admission: AD | Admit: 2013-12-02 | Discharge: 2013-12-02 | Disposition: A | Discharge status: Home / Self Care | Payer: Medicare Other | Source: Ambulatory Visit | Attending: Hematology | Admitting: Hematology

## 2013-12-02 DIAGNOSIS — C9100 Acute lymphoblastic leukemia not having achieved remission: Secondary | ICD-10-CM | POA: Insufficient documentation

## 2013-12-02 DIAGNOSIS — R0602 Shortness of breath: Secondary | ICD-10-CM

## 2013-12-02 MED ORDER — SODIUM CHLORIDE 0.9 % IV SOLN
Freq: Once | INTRAVENOUS | Status: AC
  Administered 2013-12-02: 10:00:00 via INTRAVENOUS

## 2013-12-02 MED ORDER — IOHEXOL 350 MG/ML SOLN
100.0000 mL | Freq: Once | INTRAVENOUS | Status: AC | PRN
  Administered 2013-12-02: 100 mL via INTRAVENOUS

## 2013-12-02 NOTE — Progress Notes (Signed)
Pt arrived from [place] center. Pt diagnosis Leukemia, acute lymphoid. 1L NS bolus administered per MD order. Pt tolerated well. Rt AC PIV discontinued, dry gauze and bandage applied. pt tolerated well.

## 2013-12-03 ENCOUNTER — Encounter: Payer: Self-pay | Admitting: Hematology

## 2013-12-08 ENCOUNTER — Encounter: Payer: Self-pay | Admitting: *Deleted

## 2013-12-08 ENCOUNTER — Telehealth: Payer: Self-pay | Admitting: *Deleted

## 2013-12-08 ENCOUNTER — Ambulatory Visit (HOSPITAL_BASED_OUTPATIENT_CLINIC_OR_DEPARTMENT_OTHER): Payer: Medicare Other | Admitting: Nurse Practitioner

## 2013-12-08 ENCOUNTER — Ambulatory Visit (HOSPITAL_COMMUNITY)
Admission: RE | Admit: 2013-12-08 | Discharge: 2013-12-08 | Disposition: A | Discharge status: Home / Self Care | Payer: Medicare Other | Source: Ambulatory Visit | Attending: Nurse Practitioner | Admitting: Nurse Practitioner

## 2013-12-08 ENCOUNTER — Ambulatory Visit (HOSPITAL_BASED_OUTPATIENT_CLINIC_OR_DEPARTMENT_OTHER): Payer: Medicare Other

## 2013-12-08 VITALS — BP 111/69 | HR 113 | Temp 99.0°F | Resp 20 | Ht 66.5 in | Wt 240.7 lb

## 2013-12-08 VITALS — BP 110/65 | HR 112 | Temp 98.7°F

## 2013-12-08 DIAGNOSIS — C9102 Acute lymphoblastic leukemia, in relapse: Secondary | ICD-10-CM

## 2013-12-08 DIAGNOSIS — R509 Fever, unspecified: Secondary | ICD-10-CM

## 2013-12-08 DIAGNOSIS — M545 Low back pain: Secondary | ICD-10-CM

## 2013-12-08 DIAGNOSIS — C9100 Acute lymphoblastic leukemia not having achieved remission: Secondary | ICD-10-CM | POA: Insufficient documentation

## 2013-12-08 DIAGNOSIS — R05 Cough: Secondary | ICD-10-CM | POA: Diagnosis not present

## 2013-12-08 DIAGNOSIS — Z949 Transplanted organ and tissue status, unspecified: Secondary | ICD-10-CM

## 2013-12-08 DIAGNOSIS — Z95828 Presence of other vascular implants and grafts: Secondary | ICD-10-CM

## 2013-12-08 DIAGNOSIS — J069 Acute upper respiratory infection, unspecified: Secondary | ICD-10-CM

## 2013-12-08 LAB — COMPREHENSIVE METABOLIC PANEL (CC13)
ALBUMIN: 3.4 g/dL — AB (ref 3.5–5.0)
ALT: 26 U/L (ref 0–55)
AST: 24 U/L (ref 5–34)
Alkaline Phosphatase: 178 U/L — ABNORMAL HIGH (ref 40–150)
Anion Gap: 8 mEq/L (ref 3–11)
BUN: 9.6 mg/dL (ref 7.0–26.0)
CO2: 27 mEq/L (ref 22–29)
CREATININE: 1.2 mg/dL — AB (ref 0.6–1.1)
Calcium: 9.6 mg/dL (ref 8.4–10.4)
Chloride: 105 mEq/L (ref 98–109)
GLUCOSE: 125 mg/dL (ref 70–140)
POTASSIUM: 3.6 meq/L (ref 3.5–5.1)
Sodium: 140 mEq/L (ref 136–145)
Total Bilirubin: 0.28 mg/dL (ref 0.20–1.20)
Total Protein: 6.5 g/dL (ref 6.4–8.3)

## 2013-12-08 LAB — URINALYSIS, MICROSCOPIC - CHCC
BILIRUBIN (URINE): NEGATIVE
GLUCOSE UR CHCC: NEGATIVE mg/dL
Ketones: NEGATIVE mg/dL
Nitrite: NEGATIVE
Protein: 30 mg/dL
SPECIFIC GRAVITY, URINE: 1.02 (ref 1.003–1.035)
UROBILINOGEN UR: 0.2 mg/dL (ref 0.2–1)
pH: 6 (ref 4.6–8.0)

## 2013-12-08 LAB — CBC WITH DIFFERENTIAL/PLATELET
BASO%: 0.3 % (ref 0.0–2.0)
BASOS ABS: 0 10*3/uL (ref 0.0–0.1)
EOS ABS: 0.2 10*3/uL (ref 0.0–0.5)
EOS%: 3.2 % (ref 0.0–7.0)
HCT: 30.4 % — ABNORMAL LOW (ref 34.8–46.6)
HGB: 10.2 g/dL — ABNORMAL LOW (ref 11.6–15.9)
LYMPH%: 16.5 % (ref 14.0–49.7)
MCH: 33.2 pg (ref 25.1–34.0)
MCHC: 33.6 g/dL (ref 31.5–36.0)
MCV: 99 fL (ref 79.5–101.0)
MONO#: 0.8 10*3/uL (ref 0.1–0.9)
MONO%: 11.4 % (ref 0.0–14.0)
NEUT#: 4.9 10*3/uL (ref 1.5–6.5)
NEUT%: 68.6 % (ref 38.4–76.8)
Platelets: 250 10*3/uL (ref 145–400)
RBC: 3.07 10*6/uL — ABNORMAL LOW (ref 3.70–5.45)
RDW: 12.7 % (ref 11.2–14.5)
WBC: 7.2 10*3/uL (ref 3.9–10.3)
lymph#: 1.2 10*3/uL (ref 0.9–3.3)

## 2013-12-08 MED ORDER — SODIUM CHLORIDE 0.9 % IJ SOLN
10.0000 mL | INTRAMUSCULAR | Status: DC | PRN
  Administered 2013-12-08: 10 mL via INTRAVENOUS
  Filled 2013-12-08: qty 10

## 2013-12-08 MED ORDER — HEPARIN SOD (PORK) LOCK FLUSH 100 UNIT/ML IV SOLN
500.0000 [IU] | Freq: Once | INTRAVENOUS | Status: AC
  Administered 2013-12-08: 500 [IU] via INTRAVENOUS
  Filled 2013-12-08: qty 5

## 2013-12-08 MED ORDER — AZITHROMYCIN 250 MG PO TABS: ORAL_TABLET | ORAL | Status: DC

## 2013-12-08 MED ORDER — PREDNISONE 10 MG PO TABS: ORAL_TABLET | ORAL | Status: DC

## 2013-12-08 NOTE — Progress Notes (Signed)
No new note. 

## 2013-12-08 NOTE — Telephone Encounter (Signed)
Verbal order received and read back from Selena Lesser NP for STAT CXR, cbc, cmet, blood cultures x two sites.  Order given to schedulers at this time.  Called patient and confirmed port-a-cath and appointment instructions.

## 2013-12-08 NOTE — Telephone Encounter (Signed)
   Provider input needed:  Fever, pain, weakness, heart racing   Reason for call: Request appointment for fever, pain   Constitutional: positive for chills, fatigue and fevers Respiratory: positive for cough Cardiovascular: positive for "heart racing at times" Musculoskeletal:positive for "knees, elbows, neck joint pain, hurts when trying to get out car"    ALLERGIES:  is allergic to other.  Patient last received chemotherapy/ treatment on 06-03-2013 ponatinib, STEM CELL transplant 02-06-2013  Patient was last seen in the office on 12-01-2013  Next appt is 01-05-2014  Is patient having fevers greater than 100.5?  yes, "fevers started again on Friday evening, temperature = 101.2 at the highest, this morning temp = 99.7   Is patient having uncontrolled pain, or new pain? "Joint pain to knees, elbows, neck"   Is patient having new back pain that changes with position (worsens or eases when laying down?)  "Back pain to lower back but I haver a history of disc problems"   Is patient able to eat and drink? "Not really, I don't have an appetite"  Denies trouble swallowing or mucositis.    Is patient able to pass stool without difficulty?   yes, LBM yesterday and emptied well.     Is patient having uncontrolled nausea?  no, Denies nausea    patient calls 12/08/2013 with complaint of  Constitutional: positive for chills and fatigue Respiratory: positive for cough Cardiovascular: positive for racing heart rate at times Musculoskeletal:positive for "joint pain"   Summary Based on the above information advised patient to  Await return call with next instructions   [place],   12/08/2013, 9:28 AM   Background Info  Lynn [name]   DOB: 24-Oct-1965   MR#: [id number]   CSN#   [id number] 12/08/2013

## 2013-12-09 ENCOUNTER — Encounter: Payer: Self-pay | Admitting: Nurse Practitioner

## 2013-12-09 DIAGNOSIS — J069 Acute upper respiratory infection, unspecified: Secondary | ICD-10-CM | POA: Insufficient documentation

## 2013-12-09 LAB — URINE CULTURE

## 2013-12-09 NOTE — Assessment & Plan Note (Signed)
Patient is status post allogenic stem cell transplant per [facility] on 02/06/2013.  She continues with her primary followup per [facility].  She has plans to followup again at [facility] this coming Friday, 12/12/2013. Her next followup here [facility] is scheduled for 01/05/2014.  Patient was advised to call or return if she has any new or worsening symptoms whatsoever.

## 2013-12-09 NOTE — Assessment & Plan Note (Addendum)
Patient developed fever/chills, increased fatigue, mild nonproductive cough, and body aches over the weekend.  Labs were essentially stable.  Urinalysis was normal.  Urine culture pending results. Blood culture results pending. CT angiogram of the chest obtained on 12/02/2013 was negative for pulmonary embolism. Chest x-ray obtained today did reveal some new bilateral patchy airspace disease in the midlung; with the right greater than the left- which is most concerning for either an infectious or inflammatory etiology.  Patient did not appear in any acute distress whatsoever on exam.  No shortness of breath or cough on exam.  Patient does have a history of atypical lung infection in the past.  Will place patient on both antibiotics and quick taper prednisone for this next week- pending pulmonology referral. Patient was prescribed Zithromax instead of previously planned Levaquin due to interaction with Prograf patient was previously prescribed due to stem cell transplant.

## 2013-12-09 NOTE — Progress Notes (Signed)
SYMPTOM MANAGEMENT CLINIC   HPI: Lynn [name] 48 y.o. female diagnosed with acute lymphoblastic leukemia.  Patient is status post allogenic stem cell transplant on 02/06/2013.  Patient called the cancer Center today requesting urgent care visit.  She developed fever, chills, increased fatigue, body aches, and mild nonproductive cough over the weekend.  She denies any GI symptoms or dysuria.  She does complain of some mild shortness of breath on exertion only.  She denies any chest pain, chest pressure, or pain with inspiration.  Patient reports that she had a pulmonary function test per [facility] in October 2015; and states that this test was within normal limits.  She obtained a CT angiogram of the chest on 12/02/2013 which was negative for pulmonary embolism.  HPI  CURRENT THERAPY: No active treatment plan for patient.   ROS  Past Medical History  Diagnosis Date  . Hypertension   . Brain bleed     07/04/11  . Pulmonary embolism     06/22/11  . Headache(784.0)   . Arthritis   . Anemia   . ALL (acute lymphoblastic leukemia)   . Leukemia   . Diabetes mellitus without complication     Past Surgical History  Procedure Laterality Date  . Cesarean section    . Foot surgery    . Foot surgery    . Cesarean section    . Eye surgery    . Craniotomy    . Greenfield filter      has Microcytosis; Acute pulmonary embolism; Leukocytosis; Thrombocytopenia; Anemia; Hilar lymphadenopathy; Leukemia, acute lymphoid; Acute lymphoid leukemia; Acute lymphoid leukemia in remission; Transfusion reaction; Viral meningitis; Encounter for antineoplastic chemotherapy; Essential hypertension; History of peripheral stem cell transplant; Subdural hemorrhage; Neutropenia; Fever; Right thyroid nodule; Port-a-cath in place; History of pulmonary embolism; Greenfield filter in place; Pneumonia; CAP (community acquired pneumonia); Type II or unspecified type diabetes mellitus without  mention of complication, uncontrolled; Clostridium difficile colitis; Cryptogenic organizing pneumonia; Dehydration; BOOP (bronchiolitis obliterans with organizing pneumonia); HCAP (healthcare-associated pneumonia); and URI (upper respiratory infection) on her problem list.     is allergic to other.    Medication List       This list is accurate as of: 12/08/13 11:59 PM.  Always use your most recent med list.               acyclovir 800 MG tablet  Commonly known as:  ZOVIRAX  Take 1 tablet (800 mg total) by mouth 2 (two) times daily. Marland Kitchen     ADVAIR DISKUS 250-50 MCG/DOSE Aepb  Generic drug:  Fluticasone-Salmeterol  Inhale 1 puff into the lungs as needed.     amitriptyline 10 MG tablet  Commonly known as:  ELAVIL  Take 1 tablet (10 mg total) by mouth at bedtime.     azithromycin 250 MG tablet  Commonly known as:  ZITHROMAX Z-PAK  Take 2 tabs (500 mg) PO x 1 day; then take 1 tab (250 mg) PO QD till gone.     fluconazole 200 MG tablet  Commonly known as:  DIFLUCAN  Take 400 mg by mouth daily.     folic acid 1 MG tablet  Commonly known as:  FOLVITE  Take 1 mg by mouth daily.     insulin aspart 100 UNIT/ML FlexPen  Commonly known as:  NOVOLOG  Inject 10 Units into the skin 3 (three) times daily with meals. (If you have eaten more than 50% of your meal.)  Insulin Glargine 100 UNIT/ML Solostar Pen  Commonly known as:  LANTUS  Inject 10 Units into the skin every morning. Sliding scale.     LORazepam 0.5 MG tablet  Commonly known as:  ATIVAN  Take 1 tablet (0.5 mg total) by mouth 2 (two) times daily.     montelukast 5 MG chewable tablet  Commonly known as:  SINGULAIR  Chew 5 mg by mouth at bedtime.     multivitamin tablet  Take 1 tablet by mouth daily.     ondansetron 4 MG disintegrating tablet  Commonly known as:  ZOFRAN ODT  Take 1 tablet (4 mg total) by mouth every 8 (eight) hours as needed for nausea.     predniSONE 10 MG tablet  Commonly known as:  DELTASONE    60 mg PO QD x 1 day, then 50 mg QD, then 40 mg QD, then 30 mg QD, then 20 mg QD, then 10 mg QD till gone.     prochlorperazine 5 MG tablet  Commonly known as:  COMPAZINE  Take 5 mg by mouth every 6 (six) hours as needed (nausea).     SLOW-MAG 64 MG Tbec SR tablet  Generic drug:  magnesium chloride  Take 2 tablets by mouth 3 (three) times daily.     sulfamethoxazole-trimethoprim 800-160 MG per tablet  Commonly known as:  BACTRIM DS  Take 1 tablet by mouth every Monday, Wednesday, and Friday. Takes 1 tablet Monday-Wednesday-Friday     tacrolimus 1 MG capsule  Commonly known as:  PROGRAF  Take 0.5 mg by mouth 2 (two) times daily. Patient is taking a total of 2 mg daily     tacrolimus 0.5 MG capsule  Commonly known as:  PROGRAF  Take 1 mg by mouth.     traMADol 50 MG tablet  Commonly known as:  ULTRAM  Take 1 tablet (50 mg total) by mouth every 6 (six) hours as needed for moderate pain.         PHYSICAL EXAMINATION  Blood pressure 111/69, pulse 113, temperature 99 F (37.2 C), temperature source Oral, resp. rate 20, height 5' 6.5" (1.689 m), weight 240 lb 11.2 oz (109.181 kg), last menstrual period 08/09/2011, SpO2 99 %.  Physical Exam  Constitutional: She is oriented to person, place, and time and well-developed, well-nourished, and in no distress.  HENT:  Head: Normocephalic and atraumatic.  Right Ear: External ear normal.  Left Ear: External ear normal.  Nose: Nose normal.  Mouth/Throat: Oropharynx is clear and moist.  Eyes: Conjunctivae and EOM are normal. Pupils are equal, round, and reactive to light. Right eye exhibits no discharge. Left eye exhibits no discharge. No scleral icterus.  Neck: Normal range of motion. Neck supple. No JVD present. No tracheal deviation present. No thyromegaly present.  Cardiovascular: Regular rhythm, normal heart sounds and intact distal pulses.   Pulmonary/Chest: Effort normal and breath sounds normal. No respiratory distress. She has no  wheezes. She has no rales.  Abdominal: Soft. Bowel sounds are normal. She exhibits no distension and no mass. There is no tenderness. There is no rebound and no guarding.  Musculoskeletal: Normal range of motion. She exhibits edema. She exhibits no tenderness.  Trace edema to bilateral lower extremities.  Lymphadenopathy:    She has no cervical adenopathy.  Neurological: She is alert and oriented to person, place, and time. Gait normal.  Skin: Skin is warm and dry. No rash noted. No erythema.  Psychiatric: Affect normal.  Nursing note and vitals reviewed.  LABORATORY DATA:. Appointment on 12/08/2013  Component Date Value Ref Range Status  . WBC 12/08/2013 7.2  3.9 - 10.3 10e3/uL Final  . NEUT# 12/08/2013 4.9  1.5 - 6.5 10e3/uL Final  . HGB 12/08/2013 10.2* 11.6 - 15.9 g/dL Final  . HCT 12/08/2013 30.4* 34.8 - 46.6 % Final  . Platelets 12/08/2013 250  145 - 400 10e3/uL Final  . MCV 12/08/2013 99.0  79.5 - 101.0 fL Final  . MCH 12/08/2013 33.2  25.1 - 34.0 pg Final  . MCHC 12/08/2013 33.6  31.5 - 36.0 g/dL Final  . RBC 12/08/2013 3.07* 3.70 - 5.45 10e6/uL Final  . RDW 12/08/2013 12.7  11.2 - 14.5 % Final  . lymph# 12/08/2013 1.2  0.9 - 3.3 10e3/uL Final  . MONO# 12/08/2013 0.8  0.1 - 0.9 10e3/uL Final  . Eosinophils Absolute 12/08/2013 0.2  0.0 - 0.5 10e3/uL Final  . Basophils Absolute 12/08/2013 0.0  0.0 - 0.1 10e3/uL Final  . NEUT% 12/08/2013 68.6  38.4 - 76.8 % Final  . LYMPH% 12/08/2013 16.5  14.0 - 49.7 % Final  . MONO% 12/08/2013 11.4  0.0 - 14.0 % Final  . EOS% 12/08/2013 3.2  0.0 - 7.0 % Final  . BASO% 12/08/2013 0.3  0.0 - 2.0 % Final  . Sodium 12/08/2013 140  136 - 145 mEq/L Final  . Potassium 12/08/2013 3.6  3.5 - 5.1 mEq/L Final  . Chloride 12/08/2013 105  98 - 109 mEq/L Final  . CO2 12/08/2013 27  22 - 29 mEq/L Final  . Glucose 12/08/2013 125  70 - 140 mg/dl Final  . BUN 12/08/2013 9.6  7.0 - 26.0 mg/dL Final  . Creatinine 12/08/2013 1.2* 0.6 - 1.1 mg/dL Final  .  Total Bilirubin 12/08/2013 0.28  0.20 - 1.20 mg/dL Final  . Alkaline Phosphatase 12/08/2013 178* 40 - 150 U/L Final  . AST 12/08/2013 24  5 - 34 U/L Final  . ALT 12/08/2013 26  0 - 55 U/L Final  . Total Protein 12/08/2013 6.5  6.4 - 8.3 g/dL Final  . Albumin 12/08/2013 3.4* 3.5 - 5.0 g/dL Final  . Calcium 12/08/2013 9.6  8.4 - 10.4 mg/dL Final  . Anion Gap 12/08/2013 8  3 - 11 mEq/L Final  . Glucose 12/08/2013 Negative  Negative mg/dL Final  . Bilirubin (Urine) 12/08/2013 Negative  Negative Final  . Ketones 12/08/2013 Negative  Negative mg/dL Final  . Specific Gravity, Urine 12/08/2013 1.020  1.003 - 1.035 Final  . Blood 12/08/2013 Trace  Negative Final  . pH 12/08/2013 6.0  4.6 - 8.0 Final  . Protein 12/08/2013 30  Negative- <30 mg/dL Final  . Urobilinogen, UR 12/08/2013 0.2  0.2 - 1 mg/dL Final  . Nitrite 12/08/2013 Negative  Negative Final  . Leukocyte Esterase 12/08/2013 Small  Negative Final  . RBC / HPF 12/08/2013 0-2  0 - 2 Final  . WBC, UA 12/08/2013 3-6  0 - 2 Final  . Bacteria, UA 12/08/2013 Few  Negative- Trace Final  . Epithelial Cells 12/08/2013 Few  Negative- Few Final  . Mucus, UA 12/08/2013 Small  Negative- Small Final  . Urine Culture, Routine 12/08/2013 Culture, Urine   Final   Comment: Final - ===== COLONY COUNT: ===== 15,000 COLONIES/ML Multiple bacterial morphotypes present, none predominant. Suggest appropriate recollection if  clinically indicated.      RADIOGRAPHIC STUDIES: Dg Chest 2 View  12/08/2013   CLINICAL DATA:  Acute lymphoid leukemia.  Fevers, chills, cough  EXAM: CHEST  2 VIEW  COMPARISON:  08/20/2013  FINDINGS: There is a right-sided Port-A-Cath in satisfactory position. There is left upper lobe scarring again noted. There is new bilateral patchy airspace disease in the mid lung, right greater than left, most concerning for an infectious or inflammatory etiology. The heart and mediastinal contours are unremarkable.  The osseous structures are  unremarkable.  IMPRESSION: There is new bilateral patchy airspace disease in the mid lung, right greater than left, most concerning for an infectious or inflammatory etiology.   Electronically Signed   By: Kathreen Devoid   On: 12/08/2013 11:57    ASSESSMENT/PLAN:    Acute lymphoblastic leukemia: Patient is status post allogenic stem cell transplant [facility] on 02/06/2013.  She continues with her primary followup per [facility].  She has plans to followup again at [facility] this coming Friday, 12/12/2013. Her next followup here [place] [facility] is scheduled for 01/05/2014.  Patient was advised to call or return if she has any new or worsening symptoms whatsoever.  Patient developed fever/chills, increased fatigue, mild nonproductive cough, and body aches over the weekend.  Labs were essentially stable.  Urinalysis was normal.  Urine culture pending results. Blood culture results pending. CT angiogram of the chest obtained on 12/02/2013 was negative for pulmonary embolism. Chest x-ray obtained today did reveal some new bilateral patchy airspace disease in the midlung; with the right greater than the left- which is most concerning for either an infectious or inflammatory etiology.  Patient did not appear in any acute distress whatsoever on exam.  No shortness of breath or cough on exam.  Patient does have a history of atypical lung infection in the past.  Will place patient on both antibiotics and quick taper prednisone for this next week- pending pulmonology referral. Patient was prescribed Zithromax instead of previously planned Levaquin due to interaction with Prograf patient was previously prescribed due to stem cell transplant.  URI:   Patient stated understanding of all instructions; and was in agreement with this plan of care. The patient knows to call the clinic with any problems, questions or concerns.   Review/collaboration with Dr. Lona Kettle regarding  all aspects of patient's visit today.   Total time spent with patient was 40 minutes;  with greater than 80 percent of that time spent in face to face counseling regarding her symptoms, and and coordination of care and follow up.  Disclaimer: This note was dictated with voice recognition software. Similar sounding words can inadvertently be transcribed and may not be corrected upon review.   Drue Second, NP 12/09/2013

## 2013-12-10 ENCOUNTER — Telehealth: Payer: Self-pay | Admitting: *Deleted

## 2013-12-10 ENCOUNTER — Telehealth: Payer: Self-pay | Admitting: Nurse Practitioner

## 2013-12-10 NOTE — Telephone Encounter (Signed)
, °

## 2013-12-10 NOTE — Telephone Encounter (Signed)
Called pt as noted below by Selena Lesser, NP. Pt states she is feeling much better. She started both the prednisone taper pack and antibiotic yesterday. She reports she has not run a fever and has much more energy and is not as short of breath as she has been. She has appt with Dr. Melvyn Novas, pulmonologist, tomorrow. Told pt to call us with any questions or concerns. She is agreeable to this.

## 2013-12-10 NOTE — Telephone Encounter (Signed)
-----   Message from Drue Second, NP sent at 12/09/2013  6:20 PM EST ----- PROVIDER:  [place] Triage: follow up call 24-48 hours please.

## 2013-12-11 ENCOUNTER — Ambulatory Visit (INDEPENDENT_AMBULATORY_CARE_PROVIDER_SITE_OTHER): Payer: Medicare Other | Admitting: Internal Medicine

## 2013-12-11 ENCOUNTER — Encounter: Payer: Self-pay | Admitting: Internal Medicine

## 2013-12-11 VITALS — BP 108/62 | HR 109 | Temp 98.5°F | Ht 66.0 in | Wt 243.6 lb

## 2013-12-11 DIAGNOSIS — J4530 Mild persistent asthma, uncomplicated: Secondary | ICD-10-CM

## 2013-12-11 DIAGNOSIS — R918 Other nonspecific abnormal finding of lung field: Secondary | ICD-10-CM

## 2013-12-11 MED ORDER — BUDESONIDE-FORMOTEROL FUMARATE 160-4.5 MCG/ACT IN AERO: INHALATION_SPRAY | RESPIRATORY_TRACT | Status: DC

## 2013-12-11 NOTE — Progress Notes (Signed)
Subjective:    Patient ID: Lynn [name], female    DOB: November 02, 1965,    MRN: [id number]  HPI  54 yobf no asthma as child never smoker some allergies = spring and fall itching sneezing when moved to [place] from ct in 1992 no cough / wheezing  Dx [name]  in [id number] complicated by [facility] and pna May 2015 admitted [place] complicated by L chest tube but residual fatigue and sob rx intermittently with prednisone but found that with taper noted more  Sob and fatigue  So referred by Retta Mac at Cancer center to pulmonary clinic 12/11/13.   12/11/2013 1st [facility] office visit/   On singulair/ advair maint rx and on zpak since 12/08/13   Chief Complaint  Patient presents with  . Pulmonary Consult    Referred by Drue Second, NP. Pt c/o SOB and CP for the past 2 months- worse x 10 days with fever. She states that she has also had prod cough with minimal, thick, clear sputum for the past few days.   CP present ever since chest tube placed only a little better since removed   In terms of sob some better with saba but no obvious other patterns in day to day or daytime variabilty or assoc    chest tightness, subjective wheeze overt sinus or hb symptoms. No unusual exp hx or h/o childhood pna/ asthma or knowledge of premature birth.  Sleeping ok without nocturnal  or early am exacerbation  of respiratory  c/o's or need for noct saba. Also denies any obvious fluctuation of symptoms with weather or environmental changes or other aggravating or alleviating factors except as outlined above   Current Medications, Allergies, Complete Past Medical History, Past Surgical History, Family History, and Social History were reviewed in [employer] record.            Review of Systems  Constitutional: Negative for fever, chills and unexpected weight change.  HENT: Negative for congestion, dental problem, ear pain, nosebleeds, postnasal drip, rhinorrhea, sinus pressure, sneezing, sore  throat, trouble swallowing and voice change.   Eyes: Negative for visual disturbance.  Respiratory: Positive for cough and shortness of breath. Negative for choking.   Cardiovascular: Positive for chest pain. Negative for leg swelling.  Gastrointestinal: Negative for vomiting, abdominal pain and diarrhea.  Genitourinary: Negative for difficulty urinating.  Musculoskeletal: Negative for arthralgias.  Skin: Negative for rash.  Neurological: Positive for headaches. Negative for tremors and syncope.  Hematological: Does not bruise/bleed easily.       Objective:   Physical Exam   amb mod cushingnoid obese bf nad  Wt Readings from Last 3 Encounters:  12/11/13 243 lb 9.6 oz (110.496 kg)  12/08/13 240 lb 11.2 oz (109.181 kg)  12/01/13 244 lb 14.4 oz (111.086 kg)    Vital signs reviewed    HEENT: nl dentition, turbinates, and orophanx. Nl external ear canals without cough reflex   NECK :  without JVD/Nodes/TM/ nl carotid upstrokes bilaterally   LUNGS: no acc muscle use, clear to A and P bilaterally without cough on insp or exp maneuvers   CV:  RRR  no s3 or murmur or increase in P2, no edema   ABD:  soft and nontender with nl excursion in the supine position. No bruits or organomegaly, bowel sounds nl  MS:  warm without deformities, calf tenderness, cyanosis or clubbing  SKIN: warm and dry without lesions    NEURO:  alert, approp, no deficits  CTa  12/02/13  1. No CT findings for pulmonary embolism. 2. Normal thoracic aorta. 3. Postoperative changes in the left upper lobe with pulmonary scarring and possible atelectasis. 4. Small scattered mediastinal and hilar lymph nodes appear relatively stable. 5. Patchy areas of tree-in-bud appearance in both lungs.        Assessment & Plan:

## 2013-12-11 NOTE — Patient Instructions (Signed)
symbicort 160 Take 2 puffs first thing in am and then another 2 puffs about 12 hours later and stop advair Stop singulair   Work on inhaler technique:  relax and gently blow all the way out then take a nice smooth deep breath back in, triggering the inhaler at same time you start breathing in.  Hold for up to 5 seconds if you can.  Rinse and gargle with water when done  Please schedule a follow up office visit in 2 weeks, sooner if needed

## 2013-12-13 DIAGNOSIS — R918 Other nonspecific abnormal finding of lung field: Secondary | ICD-10-CM | POA: Insufficient documentation

## 2013-12-13 DIAGNOSIS — J45909 Unspecified asthma, uncomplicated: Secondary | ICD-10-CM | POA: Insufficient documentation

## 2013-12-13 NOTE — Assessment & Plan Note (Addendum)
DDX of  difficult airways management all start with A and  include Adherence, Ace Inhibitors, Acid Reflux, Active Sinus Disease, Alpha 1 Antitripsin deficiency, Anxiety masquerading as Airways dz,  ABPA,  allergy(esp in young), Aspiration (esp in elderly), Adverse effects of DPI,  Active smokers, plus two Bs  = Bronchiectasis and Beta blocker use..and one C= CHF  Adherence is always the initial "prime suspect" and is a multilayered concern that requires a "trust but verify" approach in every patient - starting with knowing how to use medications, especially inhalers, correctly, keeping up with refills and understanding the fundamental difference between maintenance and prns vs those medications only taken for a very short course and then stopped and not refilled.  The proper method of use, as well as anticipated side effects, of a metered-dose inhaler are discussed and demonstrated to the patient. Improved effectiveness after extensive coaching during this visit to a level of approximately  75% > try symbicort 160 2bid  ? Allergy > doubt since symptoms break thru prednsione > d/c singulair as apparently not helping to reduce prednisone dependence.  ? adverse effects from dpi > d/c advair  ? Active sinus dz > possible, low threshold to add sinus CT if mucus stays discolored p zpak complete

## 2013-12-13 NOTE — Assessment & Plan Note (Signed)
Concerned with opportunistic infection vs GVH in this pt and may need to be referred to [facility] pulmonary if [facility] oncology is adjusting her immunosuppressives o  as this is a very tough call/ double edged sword.    We could offer to do BAL here in [place] if desired but not needed at present

## 2013-12-14 LAB — CULTURE, BLOOD (SINGLE)

## 2013-12-26 ENCOUNTER — Ambulatory Visit: Payer: Medicare Other | Admitting: Internal Medicine

## 2013-12-28 ENCOUNTER — Emergency Department (HOSPITAL_COMMUNITY): Payer: Medicare Other

## 2013-12-28 ENCOUNTER — Encounter (HOSPITAL_COMMUNITY): Payer: Self-pay | Admitting: Emergency Medicine

## 2013-12-28 ENCOUNTER — Emergency Department (HOSPITAL_COMMUNITY)
Admission: EM | Admit: 2013-12-28 | Discharge: 2013-12-28 | Disposition: A | Discharge status: Home / Self Care | Payer: Medicare Other | Attending: Emergency Medicine | Admitting: Emergency Medicine

## 2013-12-28 DIAGNOSIS — Z9481 Bone marrow transplant status: Secondary | ICD-10-CM | POA: Insufficient documentation

## 2013-12-28 DIAGNOSIS — Z86711 Personal history of pulmonary embolism: Secondary | ICD-10-CM | POA: Insufficient documentation

## 2013-12-28 DIAGNOSIS — J189 Pneumonia, unspecified organism: Secondary | ICD-10-CM | POA: Diagnosis not present

## 2013-12-28 DIAGNOSIS — Z856 Personal history of leukemia: Secondary | ICD-10-CM | POA: Diagnosis not present

## 2013-12-28 DIAGNOSIS — Z7952 Long term (current) use of systemic steroids: Secondary | ICD-10-CM | POA: Insufficient documentation

## 2013-12-28 DIAGNOSIS — Z794 Long term (current) use of insulin: Secondary | ICD-10-CM | POA: Insufficient documentation

## 2013-12-28 DIAGNOSIS — E119 Type 2 diabetes mellitus without complications: Secondary | ICD-10-CM | POA: Diagnosis not present

## 2013-12-28 DIAGNOSIS — Z79899 Other long term (current) drug therapy: Secondary | ICD-10-CM | POA: Diagnosis not present

## 2013-12-28 DIAGNOSIS — Z862 Personal history of diseases of the blood and blood-forming organs and certain disorders involving the immune mechanism: Secondary | ICD-10-CM | POA: Diagnosis not present

## 2013-12-28 DIAGNOSIS — Z8739 Personal history of other diseases of the musculoskeletal system and connective tissue: Secondary | ICD-10-CM | POA: Insufficient documentation

## 2013-12-28 DIAGNOSIS — Z792 Long term (current) use of antibiotics: Secondary | ICD-10-CM | POA: Insufficient documentation

## 2013-12-28 DIAGNOSIS — I10 Essential (primary) hypertension: Secondary | ICD-10-CM | POA: Insufficient documentation

## 2013-12-28 DIAGNOSIS — R079 Chest pain, unspecified: Secondary | ICD-10-CM | POA: Diagnosis present

## 2013-12-28 HISTORY — DX: Pneumonia, unspecified organism: J18.9

## 2013-12-28 LAB — CBC
HCT: 29.8 % — ABNORMAL LOW (ref 36.0–46.0)
HEMOGLOBIN: 10.1 g/dL — AB (ref 12.0–15.0)
MCH: 33.2 pg (ref 26.0–34.0)
MCHC: 33.9 g/dL (ref 30.0–36.0)
MCV: 98 fL (ref 78.0–100.0)
Platelets: 178 10*3/uL (ref 150–400)
RBC: 3.04 MIL/uL — AB (ref 3.87–5.11)
RDW: 13.7 % (ref 11.5–15.5)
WBC: 3.9 10*3/uL — ABNORMAL LOW (ref 4.0–10.5)

## 2013-12-28 LAB — DIFFERENTIAL
BASOS ABS: 0 10*3/uL (ref 0.0–0.1)
BASOS PCT: 1 % (ref 0–1)
EOS ABS: 0.5 10*3/uL (ref 0.0–0.7)
Eosinophils Relative: 13 % — ABNORMAL HIGH (ref 0–5)
Lymphocytes Relative: 31 % (ref 12–46)
Lymphs Abs: 1.2 10*3/uL (ref 0.7–4.0)
Monocytes Absolute: 0.5 10*3/uL (ref 0.1–1.0)
Monocytes Relative: 12 % (ref 3–12)
NEUTROS ABS: 1.7 10*3/uL (ref 1.7–7.7)
Neutrophils Relative %: 43 % (ref 43–77)

## 2013-12-28 LAB — BASIC METABOLIC PANEL
Anion gap: 13 (ref 5–15)
BUN: 13 mg/dL (ref 6–23)
CHLORIDE: 106 meq/L (ref 96–112)
CO2: 24 meq/L (ref 19–32)
Calcium: 9.1 mg/dL (ref 8.4–10.5)
Creatinine, Ser: 1.07 mg/dL (ref 0.50–1.10)
GFR calc Af Amer: 69 mL/min — ABNORMAL LOW (ref >90)
GFR calc non Af Amer: 60 mL/min — ABNORMAL LOW (ref >90)
GLUCOSE: 98 mg/dL (ref 70–99)
POTASSIUM: 3.7 meq/L (ref 3.7–5.3)
Sodium: 143 mEq/L (ref 137–147)

## 2013-12-28 LAB — I-STAT TROPONIN, ED: Troponin i, poc: 0 ng/mL (ref 0.00–0.08)

## 2013-12-28 MED ORDER — HEPARIN SOD (PORK) LOCK FLUSH 100 UNIT/ML IV SOLN
500.0000 [IU] | Freq: Once | INTRAVENOUS | Status: DC
  Filled 2013-12-28: qty 5

## 2013-12-28 NOTE — ED Provider Notes (Signed)
CSN: [id number]     Arrival date & time 12/28/13  0654 History   First MD Initiated Contact with Patient 12/28/13 0750     Chief Complaint  Patient presents with  . Chest Pain   Lynn [name] is a 48 y.o. female with a history of ALL s/p allogenic stem cell transplant Jan 2015 presenting with pain under left shoulder blade.   This started gradually 1 week ago and has remained constant; occurs at rest and is worse with deep breathing, nothing else (tylenol, NSAIDs, position, exertion) makes it better or worse. She also reports mild 20-sec long paroxysms of "tingling" on her anterior chest wall since yesterday. This is not exertional, positional, pleuritic, and not incredibly bothersome to the patient. She is currently being treated for pneumonia, and taking 10mg  prednisone every other day; has had recent negative CTA of the chest. Denies fever, chills, productive cough, chest pain, shortness of breath, abd pain, N/V/D, dysuria, leg swelling, recent travel, history of clots.   (Consider location/radiation/quality/duration/timing/severity/associated sxs/prior Treatment) HPI  Past Medical History  Diagnosis Date  . Hypertension   . Brain bleed     07/04/11  . Pulmonary embolism     06/22/11  . Headache(784.0)   . Arthritis   . Anemia   . ALL (acute lymphoblastic leukemia)   . Leukemia   . Diabetes mellitus without complication   . Pneumonia    Past Surgical History  Procedure Laterality Date  . Cesarean section    . Foot surgery    . Foot surgery    . Cesarean section    . Eye surgery    . Craniotomy    . Greenfield filter    . Bone marrow transplant    . Lung biopsy     Family History  Problem Relation Age of Onset  . Pancreatic cancer Father   . Cancer Mother     colon  . Pancreatic cancer Mother   . Diabetes Neg Hx   . Asthma Son    History  Substance Use Topics  . Smoking status: Never Smoker   . Smokeless tobacco: Never Used  . Alcohol Use: No   OB History     No data available     Review of Systems Allergies  Other  Home Medications   Prior to Admission medications   Medication Sig Start Date End Date Taking? Authorizing Provider  acyclovir (ZOVIRAX) 800 MG tablet Take 1 tablet (800 mg total) by mouth 2 (two) times daily. . 08/27/13  Yes Concha Norway, MD  amitriptyline (ELAVIL) 10 MG tablet Take 1 tablet (10 mg total) by mouth at bedtime. 09/15/13  Yes Philmore Pali, NP  amLODipine (NORVASC) 5 MG tablet Take 5 mg by mouth daily.   Yes Historical Provider, MD  azithromycin (ZITHROMAX) 250 MG tablet Take 250 mg by mouth daily. For 30 days   Yes Historical Provider, MD  folic acid (FOLVITE) 1 MG tablet Take 1 mg by mouth daily.  03/13/13 03/13/14 Yes Historical Provider, MD  insulin aspart (NOVOLOG) 100 UNIT/ML FlexPen Inject 10 Units into the skin 3 (three) times daily with meals. (If you have eaten more than 50% of your meal.) 08/27/13  Yes Concha Norway, MD  Insulin Glargine (LANTUS) 100 UNIT/ML Solostar Pen Inject 10 Units into the skin every morning. Sliding scale. 08/27/13  Yes Concha Norway, MD  LORazepam (ATIVAN) 0.5 MG tablet Take 1 tablet (0.5 mg total) by mouth 2 (two) times daily. Patient taking differently: Take  0.5 mg by mouth 2 (two) times daily as needed for anxiety or sleep.  12/01/13  Yes Aasim Marla Roe, MD  magnesium chloride (SLOW-MAG) 64 MG TBEC SR tablet Take 2 tablets by mouth 3 (three) times daily.  11/29/12  Yes Historical Provider, MD  Multiple Vitamin (MULTIVITAMIN) tablet Take 1 tablet by mouth daily.   Yes Historical Provider, MD  ondansetron (ZOFRAN ODT) 4 MG disintegrating tablet Take 1 tablet (4 mg total) by mouth every 8 (eight) hours as needed for nausea. 09/24/13  Yes Concha Norway, MD  predniSONE (DELTASONE) 10 MG tablet Take 10 mg by mouth every other day.   Yes Historical Provider, MD  prochlorperazine (COMPAZINE) 5 MG tablet Take 5 mg by mouth every 6 (six) hours as needed (nausea).  11/29/12  Yes Historical Provider, MD   sulfamethoxazole-trimethoprim (BACTRIM DS) 800-160 MG per tablet Take 1 tablet by mouth every Monday, Wednesday, and Friday. Takes 1 tablet Monday-Wednesday-Friday 08/27/13  Yes Concha Norway, MD  tacrolimus (PROGRAF) 0.5 MG capsule Take 1 mg by mouth 2 (two) times daily.  11/18/13 02/16/14 Yes Historical Provider, MD  traMADol (ULTRAM) 50 MG tablet Take 1 tablet (50 mg total) by mouth every 6 (six) hours as needed for moderate pain. 09/24/13  Yes Concha Norway, MD  azithromycin (ZITHROMAX Z-PAK) 250 MG tablet Take 2 tabs (500 mg) PO x 1 day; then take 1 tab (250 mg) PO QD till gone. Patient not taking: Reported on 12/28/2013 12/08/13   Drue Second, NP  budesonide-formoterol [facility]) 160-4.5 MCG/ACT inhaler Take 2 puffs first thing in am and then another 2 puffs about 12 hours later. Patient not taking: Reported on 12/28/2013 12/11/13   Tanda Rockers, MD  predniSONE (DELTASONE) 10 MG tablet 60 mg PO QD x 1 day, then 50 mg QD, then 40 mg QD, then 30 mg QD, then 20 mg QD, then 10 mg QD till gone. Patient not taking: Reported on 12/28/2013 12/08/13   Drue Second, NP   BP 103/67 mmHg  Pulse 86  Temp(Src) 98.2 F (36.8 C) (Oral)  Resp 16  Ht 5\' 7"  (1.702 m)  Wt 243 lb (110.224 kg)  BMI 38.05 kg/m2  SpO2 100%  LMP 08/09/2011 Physical Exam  Constitutional: She is oriented to person, place, and time. She appears well-developed and well-nourished. No distress.  HENT:  Mouth/Throat: Oropharynx is clear and moist.  Eyes: Conjunctivae are normal. Pupils are equal, round, and reactive to light.  Neck: Neck supple. No JVD present.  Cardiovascular: Normal rate, regular rhythm, normal heart sounds and intact distal pulses.   No murmur heard. Pulmonary/Chest: Effort normal and breath sounds normal. No respiratory distress. She has no wheezes. She has no rales.  99% saturation breathing ambient air without tachypnea.   Abdominal: Soft. Bowel sounds are normal. She exhibits no distension. There is no  tenderness.  Musculoskeletal: Normal range of motion. She exhibits no edema or tenderness.  Lymphadenopathy:    She has no cervical adenopathy.  Neurological: She is alert and oriented to person, place, and time. She exhibits normal muscle tone.  Skin: Skin is warm and dry. No rash noted.  Vitals reviewed.   ED Course  Procedures (including critical care time) Labs Review Labs Reviewed  CBC - Abnormal; Notable for the following:    WBC 3.9 (*)    RBC 3.04 (*)    Hemoglobin 10.1 (*)    HCT 29.8 (*)    All other components within normal limits  BASIC METABOLIC PANEL -  Abnormal; Notable for the following:    GFR calc non Af Amer 60 (*)    GFR calc Af Amer 69 (*)    All other components within normal limits  DIFFERENTIAL - Abnormal; Notable for the following:    Eosinophils Relative 13 (*)    All other components within normal limits  CBC WITH DIFFERENTIAL  I-STAT TROPOININ, ED   Imaging Review No results found.   EKG Interpretation   Date/Time:  Sunday December 28 2013 07:08:44 EST Ventricular Rate:  98 PR Interval:  125 QRS Duration: 84 QT Interval:  358 QTC Calculation: 457 R Axis:   20 Text Interpretation:  Sinus rhythm Normal ECG Confirmed by BEATON  MD,  ROBERT ([id number]) on 12/28/2013 9:17:39 AM     MDM   Final diagnoses:  Pneumonia    47 y.o. female ALL patient being treated for pneumonia with imaging evidence of LUL pulmonary scarring s/p left chest tube placement presenting with pleuritic pain affecting left subscapular area. Doubt PE in light of no change in clinical status since recent negative CTA chest, no hypoxemia, tachycardia or tachypnea. CXR here is clear, pt isn't neutropenic, troponins and ECG are normal. Will discharge with NSAIDs.     Patrecia Pour, MD 12/28/13 [place], MD 12/29/13 Lurline Hare

## 2013-12-28 NOTE — ED Notes (Signed)
Pt c/o L posterior rib pain x 5 days, midsternal CP onset yesterday, resolved at this moment, not reproducable. pt is currently being tx for PNA, pt is being tx for Leukemia, bone marrow transplant 1/15.

## 2013-12-28 NOTE — Discharge Instructions (Signed)
The cause of your pain is due to inflammation of the lungs and the lining of the lungs. This is likely due to the chest tube you had as well as the pneumonia you're being treated for. This problem should improve. Your heart appears normal.  You can take tylenol or ibuprofen for the pain as directed. Return to the ED or seek medical attention if you experience worsening pain or trouble breathing or any fevers.

## 2013-12-28 NOTE — ED Notes (Signed)
Patient transported to X-ray 

## 2013-12-29 ENCOUNTER — Encounter: Payer: Self-pay | Admitting: Internal Medicine

## 2014-01-02 ENCOUNTER — Telehealth: Payer: Self-pay | Admitting: Hematology and Oncology

## 2014-01-02 NOTE — Telephone Encounter (Signed)
s.w pt and advised on Dec appt......pt ok and aware °

## 2014-01-05 ENCOUNTER — Ambulatory Visit: Payer: Medicaid Other

## 2014-01-07 ENCOUNTER — Other Ambulatory Visit: Payer: Self-pay | Admitting: Internal Medicine

## 2014-01-08 ENCOUNTER — Encounter: Payer: Self-pay | Admitting: Hematology and Oncology

## 2014-01-08 ENCOUNTER — Ambulatory Visit (HOSPITAL_BASED_OUTPATIENT_CLINIC_OR_DEPARTMENT_OTHER): Payer: Medicare Other | Admitting: Hematology and Oncology

## 2014-01-08 ENCOUNTER — Telehealth: Payer: Self-pay | Admitting: Hematology and Oncology

## 2014-01-08 VITALS — BP 113/75 | HR 98 | Temp 98.8°F | Resp 18 | Ht 67.0 in | Wt 245.3 lb

## 2014-01-08 DIAGNOSIS — D72819 Decreased white blood cell count, unspecified: Secondary | ICD-10-CM

## 2014-01-08 DIAGNOSIS — Z86711 Personal history of pulmonary embolism: Secondary | ICD-10-CM

## 2014-01-08 DIAGNOSIS — R918 Other nonspecific abnormal finding of lung field: Secondary | ICD-10-CM

## 2014-01-08 DIAGNOSIS — D630 Anemia in neoplastic disease: Secondary | ICD-10-CM

## 2014-01-08 DIAGNOSIS — Z9481 Bone marrow transplant status: Secondary | ICD-10-CM

## 2014-01-08 DIAGNOSIS — C9101 Acute lymphoblastic leukemia, in remission: Secondary | ICD-10-CM

## 2014-01-08 NOTE — Assessment & Plan Note (Signed)
She is on prednisone and Prograf as part of her immunosuppressive therapy. She will continue Zithromax for now for recent infection. She is due for her posttransplant vaccination program, scheduled to be given tomorrow at the transplant center at [facility]

## 2014-01-08 NOTE — Assessment & Plan Note (Signed)
Clinically, she has no signs of recurrence of disease. She has appointment to see her transplant physician with the possibility of starting on a tyrosine kinase inhibitor to reduce risk of relapse In the meantime, I plan to see her back next year for supportive care visits.

## 2014-01-08 NOTE — Assessment & Plan Note (Signed)
She is not on chronic anticoagulation therapy due to history of subdural hematoma. Recent CT angiogram dated 12/02/2013 was negative for PE. 

## 2014-01-08 NOTE — Progress Notes (Signed)
[place] FOLLOW-UP progress notes  Patient Care Team: Donnie Coffin, MD as PCP - General (Family Medicine)  CHIEF COMPLAINTS/PURPOSE OF VISIT:  Lynn [name], status post allogenic stem cell transplant  HISTORY OF PRESENTING ILLNESS:  Lynn Lynn [name] 48 y.o. female was transferred to my care after her prior physician has left.  I reviewed the patient's records extensive and collaborated the history with the patient. Summary of her history is as follows: The patient was diagnosed with precursor B-cell Lynn [name], Philadelphia chromosome positive. She initially presented in 2013 with leg swelling and was found to have pulmonary emboli. In the course of workup, she was found to have Lynn [name]. Her anticoagulation therapy was discontinued when she developed complication during chemotherapy with subdural hematoma requiring placement of drainage. She received autologous stem cell transplant in October 2013 and had relapse. Subsequently, on 02/06/2013, she has unrelated allogeneic stem cell transplant.  Her post transplant course was complicated by C. Difficile positive diarrhea treated with oral vancomycin, pneumonia likely bacterial.  CXR with new patchy opacities treated with zosyn and moxifloxacin and acute kidney injury secondary to dehydration from diarrhea and ATN which improved with hydration.   Recently she has recurrence of pneumonia. CT angiogram on 12/02/2013 showed no evidence of PE. Dose of her prednisone was increased and currently she is on azithromycin for chronic suppressive therapy. She denies chest pain, shortness of breath or cough. 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  MEDICAL HISTORY:  Past Medical History  Diagnosis Date  . Hypertension   . Brain bleed     07/04/11  . Pulmonary embolism     06/22/11  . Headache(784.0)   . Arthritis   . Anemia   . Lynn [name] (acute lymphoblastic leukemia)   . Leukemia   . Diabetes mellitus without complication   .  Pneumonia     SURGICAL HISTORY: Past Surgical History  Procedure Laterality Date  . Cesarean section    . Foot surgery    . Foot surgery    . Cesarean section    . Eye surgery    . Craniotomy    . Greenfield filter    . Bone marrow transplant    . Lung biopsy      SOCIAL HISTORY: History   Social History  . Marital Status: Divorced    Spouse Name: N/A    Number of Children: 2  . Years of Education: Assoc   Occupational History  . N/A    Social History Main Topics  . Smoking status: Never Smoker   . Smokeless tobacco: Never Used  . Alcohol Use: No  . Drug Use: No  . Sexual Activity: Not Currently    Birth Control/ Protection: Abstinence   Other Topics Concern  . Not on file   Social History Narrative   Pt lives with children.    Caffeine Use: 1 cup daily.    FAMILY HISTORY: Family History  Problem Relation Age of Onset  . Pancreatic cancer Father   . Cancer Mother     colon  . Pancreatic cancer Mother   . Diabetes Neg Hx   . Asthma Son     ALLERGIES:  is allergic to other.  MEDICATIONS:  Current Outpatient Prescriptions  Medication Sig Dispense Refill  . acyclovir (ZOVIRAX) 800 MG tablet Take 1 tablet (800 mg total) by mouth 2 (two) times daily. . 60 tablet 5  . amitriptyline (ELAVIL) 10 MG tablet Take 1 tablet (10 mg total)  by mouth at bedtime. 30 tablet 6  . amLODipine (NORVASC) 5 MG tablet Take 5 mg by mouth daily.    Marland Kitchen azithromycin (ZITHROMAX) 250 MG tablet Take 250 mg by mouth daily. For 30 days    . folic acid (FOLVITE) 1 MG tablet Take 1 mg by mouth daily.     . insulin aspart (NOVOLOG) 100 UNIT/ML FlexPen Inject 10 Units into the skin 3 (three) times daily with meals. (If you have eaten more than 50% of your meal.)    . Insulin Glargine (LANTUS) 100 UNIT/ML Solostar Pen Inject 10 Units into the skin every morning. Sliding scale.    Marland Kitchen LORazepam (ATIVAN) 0.5 MG tablet Take 1 tablet (0.5 mg total) by mouth 2 (two) times daily. (Patient taking  differently: Take 0.5 mg by mouth 2 (two) times daily as needed for anxiety or sleep. ) 30 tablet 5  . magnesium chloride (SLOW-MAG) 64 MG TBEC SR tablet Take 2 tablets by mouth 3 (three) times daily.     . Multiple Vitamin (MULTIVITAMIN) tablet Take 1 tablet by mouth daily.    . ondansetron (ZOFRAN ODT) 4 MG disintegrating tablet Take 1 tablet (4 mg total) by mouth every 8 (eight) hours as needed for nausea. 30 tablet 1  . predniSONE (DELTASONE) 10 MG tablet 60 mg PO QD x 1 day, then 50 mg QD, then 40 mg QD, then 30 mg QD, then 20 mg QD, then 10 mg QD till gone. 22 tablet 0  . predniSONE (DELTASONE) 10 MG tablet Take 10 mg by mouth daily with breakfast.     . prochlorperazine (COMPAZINE) 5 MG tablet Take 5 mg by mouth every 6 (six) hours as needed (nausea).     . sulfamethoxazole-trimethoprim (BACTRIM DS) 800-160 MG per tablet Take 1 tablet by mouth every Monday, Wednesday, and Friday. Takes 1 tablet Monday-Wednesday-Friday 30 tablet 3  . tacrolimus (PROGRAF) 0.5 MG capsule Take 1 mg by mouth 2 (two) times daily.     . traMADol (ULTRAM) 50 MG tablet Take 1 tablet (50 mg total) by mouth every 6 (six) hours as needed for moderate pain. 30 tablet 0   No current facility-administered medications for this visit.    REVIEW OF SYSTEMS:   Constitutional: Denies fevers, chills or abnormal night sweats Eyes: Denies blurriness of vision, double vision or watery eyes Ears, nose, mouth, throat, and face: Denies mucositis or sore throat Cardiovascular: Denies palpitation, chest discomfort or lower extremity swelling Gastrointestinal:  Denies nausea, heartburn or change in bowel habits  Lymphatics: Denies new lymphadenopathy or easy bruising Neurological:Denies numbness, tingling or new weaknesses Behavioral/Psych: Mood is stable, no new changes  Lynn [name] other systems were reviewed with the patient and are negative.  PHYSICAL EXAMINATION: ECOG PERFORMANCE STATUS: 0 - Asymptomatic  Filed Vitals:    01/08/14 1012  BP: 113/75  Pulse: 98  Temp: 98.8 F (37.1 C)  Resp: 18   Filed Weights   01/08/14 1012  Weight: 245 lb 4.8 oz (111.267 kg)    GENERAL:alert, no distress and comfortable. She is morbidly obese SKIN: skin color, texture, turgor are normal, no rashes or significant lesions EYES: normal, conjunctiva are pink and non-injected, sclera clear OROPHARYNX:no exudate, normal lips, buccal mucosa, and tongue  NECK: supple, thyroid normal size, non-tender, without nodularity LYMPH:  no palpable lymphadenopathy in the cervical, axillary or inguinal LUNGS: clear to auscultation and percussion with normal breathing effort HEART: regular rate & rhythm and no murmurs without lower extremity edema ABDOMEN:abdomen soft, non-tender and  normal bowel sounds Musculoskeletal:no cyanosis of digits and no clubbing  PSYCH: alert & oriented x 3 with fluent speech NEURO: no focal motor/sensory deficits  LABORATORY DATA:  I have reviewed the data as listed Lab Results  Component Value Date   WBC 3.9* 12/28/2013   HGB 10.1* 12/28/2013   HCT 29.8* 12/28/2013   MCV 98.0 12/28/2013   PLT 178 12/28/2013    Recent Labs  06/09/13 2100 08/20/13 1030 08/23/13 1700  09/24/13 0903 10/16/13 1422  12/01/13 1239 12/08/13 1144 12/28/13 0815  NA  --  138 144  < > 142 139  < > 143 140 143  K  --  3.8 3.7  < > 4.1 4.4  < > 3.5 3.6 3.7  CL  --  101 107  --   --  107  --   --   --  106  CO2  --  20 23  < > 26 23  < > '25 27 24  ' GLUCOSE  --  300* 135*  < > 101 179*  < > 132 125 98  BUN  --  20 11  < > 23.6 25*  < > 12.4 9.6 13  CREATININE  --  1.29* 0.89  < > 1.3* 1.9*  < > 1.3* 1.2* 1.07  CALCIUM  --  8.9 9.2  < > 9.7 9.3  < > 9.3 9.6 9.1  GFRNONAA  --  48* 75*  --   --   --   --   --   --  60*  GFRAA  --  56* 87*  --   --   --   --   --   --  69*  PROT  --  5.7* 6.1  < > 6.4  --   --  5.9* 6.5  --   ALBUMIN  --  3.0* 3.3*  < > 3.8  --   --  3.4* 3.4*  --   AST  --  60* 47*  < > 24  --   --  57*  24  --   ALT  --  102* 90*  < > 37  --   --  67* 26  --   ALKPHOS  --  150* 172*  < > 85  --   --  199* 178*  --   BILITOT  --  0.2* <0.2*  < > 0.31  --   --  0.30 0.28  --   BILIDIR <0.2  --   --   --   --   --   --   --   --   --   < > = values in this interval not displayed.  RADIOGRAPHIC STUDIES: I have personally reviewed the radiological images as listed and agreed with the findings in the report. Dg Chest 2 View  12/28/2013   CLINICAL DATA:  Left sided posterior upper back pain x several days, weakness, recent pna, hx leukemia, nonsmoker  EXAM: CHEST - 2 VIEW  COMPARISON:  12/08/2013  FINDINGS: Right IJ dual-lumen power port stable. Some improvement in the right lower lung and left upper lobe interstitial and airspace opacities with some residual linear opacities still evident. Heart size remains normal. No effusion. Visualized skeletal structures are unremarkable. IVC filter partially visualized.  IMPRESSION: 1. Partial improvement in left upper lobe and right lower lobe infiltrates.   Electronically Signed   By: Arne Cleveland [name]   On: 12/28/2013 09:37  ASSESSMENT & PLAN:  Acute lymphoblastic leukemia in remission Clinically, she has no signs of recurrence of disease. She has appointment to see her transplant physician with the possibility of starting on a tyrosine kinase inhibitor to reduce risk of relapse In the meantime, I plan to see her back next year for supportive care visits.  Pulmonary infiltrates The cause is unknown, could be due to opportunistic infection. She is currently on chronic suppressive antibiotic therapy for a month. Recent chest x-ray is stable. She follows closely with pulmonologist for this.  Leukopenia This is due to her immunosuppressive therapy. Her platelet levels are currently being monitored by her transplant physician.  S/P allogeneic bone marrow transplant She is on prednisone and Prograf as part of her immunosuppressive therapy. She will  continue Zithromax for now for recent infection. She is due for her posttransplant vaccination program, scheduled to be given tomorrow at the transplant center at [facility]  Anemia in neoplastic disease This is likely anemia of chronic disease. The patient denies recent history of bleeding such as epistaxis, hematuria or hematochezia. She is asymptomatic from the anemia. We will observe for now.  She does not require transfusion now. She is currently taking folic acid supplement.   History of pulmonary embolism She is not on chronic anticoagulation therapy due to history of subdural hematoma. Recent CT angiogram dated 12/02/2013 was negative for PE.   Orders Placed This Encounter  Procedures  . CBC with Differential    Standing Status: Future     Number of Occurrences:      Standing Expiration Date: 02/12/2015  . Comprehensive metabolic panel    Standing Status: Future     Number of Occurrences:      Standing Expiration Date: 02/12/2015  . Lactate dehydrogenase    Standing Status: Future     Number of Occurrences:      Standing Expiration Date: 02/12/2015  . Morphology    Standing Status: Future     Number of Occurrences:      Standing Expiration Date: 02/12/2015    Lynn [name] questions were answered. The patient knows to call the clinic with any problems, questions or concerns. I spent 40 minutes counseling the patient face to face. The total time spent in the appointment was 60 minutes and more than 50% was on counseling.     [facility], [place], MD 01/08/2014 9:16 PM

## 2014-01-08 NOTE — Assessment & Plan Note (Signed)
This is due to her immunosuppressive therapy. Her platelet levels are currently being monitored by her transplant physician. 

## 2014-01-08 NOTE — Assessment & Plan Note (Signed)
This is likely anemia of chronic disease. The patient denies recent history of bleeding such as epistaxis, hematuria or hematochezia. She is asymptomatic from the anemia. We will observe for now.  She does not require transfusion now. She is currently taking folic acid supplement.

## 2014-01-08 NOTE — Assessment & Plan Note (Signed)
The cause is unknown, could be due to opportunistic infection. She is currently on chronic suppressive antibiotic therapy for a month. Recent chest x-ray is stable. She follows closely with pulmonologist for this.

## 2014-01-08 NOTE — Telephone Encounter (Signed)
, °

## 2014-01-12 ENCOUNTER — Ambulatory Visit (INDEPENDENT_AMBULATORY_CARE_PROVIDER_SITE_OTHER): Payer: Medicare Other | Admitting: Endocrinology

## 2014-01-12 ENCOUNTER — Encounter: Payer: Self-pay | Admitting: Endocrinology

## 2014-01-12 VITALS — BP 107/74 | HR 103 | Temp 97.7°F | Resp 14 | Ht 67.0 in | Wt 244.2 lb

## 2014-01-12 DIAGNOSIS — E1165 Type 2 diabetes mellitus with hyperglycemia: Secondary | ICD-10-CM

## 2014-01-12 DIAGNOSIS — N289 Disorder of kidney and ureter, unspecified: Secondary | ICD-10-CM

## 2014-01-12 DIAGNOSIS — I10 Essential (primary) hypertension: Secondary | ICD-10-CM

## 2014-01-12 DIAGNOSIS — IMO0002 Reserved for concepts with insufficient information to code with codable children: Secondary | ICD-10-CM

## 2014-01-12 LAB — COMPREHENSIVE METABOLIC PANEL
ALBUMIN: 3.8 g/dL (ref 3.5–5.2)
ALK PHOS: 128 U/L — AB (ref 39–117)
ALT: 96 U/L — ABNORMAL HIGH (ref 0–35)
AST: 90 U/L — ABNORMAL HIGH (ref 0–37)
BILIRUBIN TOTAL: 0.3 mg/dL (ref 0.2–1.2)
BUN: 15 mg/dL (ref 6–23)
CO2: 28 mEq/L (ref 19–32)
Calcium: 9 mg/dL (ref 8.4–10.5)
Chloride: 105 mEq/L (ref 96–112)
Creatinine, Ser: 1.1 mg/dL (ref 0.4–1.2)
GFR: 65.06 mL/min (ref >60.00)
Glucose, Bld: 126 mg/dL — ABNORMAL HIGH (ref 70–99)
POTASSIUM: 3.6 meq/L (ref 3.5–5.1)
SODIUM: 137 meq/L (ref 135–145)
TOTAL PROTEIN: 6.3 g/dL (ref 6.0–8.3)

## 2014-01-12 LAB — LIPID PANEL
CHOL/HDL RATIO: 3
Cholesterol: 208 mg/dL — ABNORMAL HIGH (ref 0–200)
HDL: 62.3 mg/dL (ref >39.00)
NONHDL: 145.7
Triglycerides: 258 mg/dL — ABNORMAL HIGH (ref 0.0–149.0)
VLDL: 51.6 mg/dL — AB (ref 0.0–40.0)

## 2014-01-12 LAB — HEMOGLOBIN A1C: Hgb A1c MFr Bld: 5.5 % (ref 4.6–6.5)

## 2014-01-12 LAB — LDL CHOLESTEROL, DIRECT: Direct LDL: 109.3 mg/dL

## 2014-01-12 NOTE — Patient Instructions (Addendum)
Base the dose of Novolog on amount of food and try to keep 2 hour after meal sugars under 160  Lantus stays at 10 unless am sugar stays over 130 or goes < 80  Janumet 1 daily

## 2014-01-12 NOTE — Progress Notes (Signed)
Patient ID: Lynn [name], female   DOB: [birth year], 48 y.o.   MRN: [id number]    Reason for Appointment: Followup for Type 2 Diabetes  Referring physician: Donnie Coffin  History of Present Illness:          Diagnosis: Type 2 diabetes mellitus, date of diagnosis: 2013        Past history: She weighed nearly 300 lb in 2013 around that time her diabetes was diagnosed Although her A1c at that time was reported at 7.4 she does not remember being told about diabetes and no treatment was given In 5/15 when she was getting steroids for her bone marrow transplant her blood sugar was over 400 She was started on insulin and had been taking a basal bolus insulin regimen subsequently  Recent history:   On her initial consultation she was taking 20 mg prednisone and was given a basal bolus insulin regimen for control Although she required small amounts of insulin she could not taper off insulin even when off prednisone She is back on prednisone again this month and will continue this for 2 months Although she is generally having fairly good blood sugars she tends to have high postprandial readings She is not adjusting her mealtime doses much based on what she is eating; also checking readings after supper very rarely Also has only a couple of fasting readings She has sporadic high readings up to 186 but also 1 afternoon low sugar episode when she was nauseated Has not been able to lose any weight even though she is recently able to walk a little A1c in 9/15 was upper normal She takes her Lantus is in the morning with her breakfast and is generally eating 2 meals a day She was told to get off her metformin nor Janumet because of renal dysfunction but this appears to be better now       Oral hypoglycemic drugs the patient is taking are: None    Side effects from medications have been: Diarrhea from Metformin over 1000 mg dose INSULIN regimen is described as: NovoLog 8-10 units with meals, Lantus   10 in a.m.  Glucose monitoring:  done one time a day         Glucometer:  Accu-Chek      PRE-MEAL Breakfast Lunch  1 PM-6 PM  Bedtime Overall  Glucose range:  104, 113   102   50-186   170, 155    Mean/median:     127    Glycemic control:  Lab Results  Component Value Date   HGBA1C 6.5 10/16/2013   HGBA1C 7.4* 05/30/2013   HGBA1C 7.4* 06/17/2011   Lab Results  Component Value Date   CREATININE 1.07 12/28/2013    Self-care: The diet that the patient has been following is: tries to limit  Portions, may have some sweets    Meals: 2 meals per day. Light supper usually,  eating eggs or cereal with breakfast at 10 am.   Exercise: none except housework, joint pains        Dietician visit: Most recent: 5/15 at the hospital              Compliance with the medical regimen: Fair Retinal exam: Most recent:.2013     Weight history: Wt Readings from Last 3 Encounters:  01/12/14 244 lb 3.2 oz (110.768 kg)  01/08/14 245 lb 4.8 oz (111.267 kg)  12/28/13 243 lb (110.224 kg)      Medication List  This list is accurate as of: 01/12/14 11:10 AM.  Always use your most recent med list.               acyclovir 800 MG tablet  Commonly known as:  ZOVIRAX  Take 1 tablet (800 mg total) by mouth 2 (two) times daily. Marland Kitchen     ADVAIR DISKUS 250-50 MCG/DOSE Aepb  Generic drug:  Fluticasone-Salmeterol  Inhale 1 puff into the lungs.     amitriptyline 10 MG tablet  Commonly known as:  ELAVIL  Take 1 tablet (10 mg total) by mouth at bedtime.     amLODipine 5 MG tablet  Commonly known as:  NORVASC  Take 5 mg by mouth daily.     azithromycin 250 MG tablet  Commonly known as:  ZITHROMAX  Take 250 mg by mouth daily. For 30 days     benzonatate 100 MG capsule  Commonly known as:  TESSALON  Take 100 mg by mouth.     folic acid 1 MG tablet  Commonly known as:  FOLVITE  Take 1 mg by mouth daily.     insulin aspart 100 UNIT/ML FlexPen  Commonly known as:  NOVOLOG  Inject 10 Units  into the skin 3 (three) times daily with meals. (If you have eaten more than 50% of your meal.)     Insulin Glargine 100 UNIT/ML Solostar Pen  Commonly known as:  LANTUS  Inject 10 Units into the skin every morning. Sliding scale.     LORazepam 0.5 MG tablet  Commonly known as:  ATIVAN  Take 1 tablet (0.5 mg total) by mouth 2 (two) times daily.     multivitamin tablet  Take 1 tablet by mouth daily.     ondansetron 4 MG disintegrating tablet  Commonly known as:  ZOFRAN ODT  Take 1 tablet (4 mg total) by mouth every 8 (eight) hours as needed for nausea.     predniSONE 10 MG tablet  Commonly known as:  DELTASONE  Take 10 mg by mouth daily with breakfast.     predniSONE 10 MG tablet  Commonly known as:  DELTASONE  Take 10 mg by mouth daily.     prochlorperazine 5 MG tablet  Commonly known as:  COMPAZINE  Take 5 mg by mouth every 6 (six) hours as needed (nausea).     SINGULAIR 10 MG tablet  Generic drug:  montelukast  Take 10 mg by mouth.     SLOW-MAG 64 MG Tbec SR tablet  Generic drug:  magnesium chloride  Take 2 tablets by mouth 3 (three) times daily.     sulfamethoxazole-trimethoprim 800-160 MG per tablet  Commonly known as:  BACTRIM DS  Take 1 tablet by mouth every Monday, Wednesday, and Friday. Takes 1 tablet Monday-Wednesday-Friday     tacrolimus 0.5 MG capsule  Commonly known as:  PROGRAF  Take 1 mg by mouth 2 (two) times daily.     traMADol 50 MG tablet  Commonly known as:  ULTRAM  Take 1 tablet (50 mg total) by mouth every 6 (six) hours as needed for moderate pain.        Allergies:  Allergies  Allergen Reactions  . Other Rash    Please do not use clear tegaderm dressings as patient states they pull her skin off.  Mepilex is fine.    Past Medical History  Diagnosis Date  . Hypertension   . Brain bleed     07/04/11  . Pulmonary embolism     06/22/11  .  Headache(784.0)   . Arthritis   . Anemia   . ALL (acute lymphoblastic leukemia)   . Leukemia    . Diabetes mellitus without complication   . Pneumonia     Past Surgical History  Procedure Laterality Date  . Cesarean section    . Foot surgery    . Foot surgery    . Cesarean section    . Eye surgery    . Craniotomy    . [place] filter    . Bone marrow transplant    . Lung biopsy      Family History  Problem Relation Age of Onset  . Pancreatic cancer Father   . Cancer Mother     colon  . Pancreatic cancer Mother   . Diabetes Neg Hx   . Asthma Son     Social History:  reports that she has never smoked. She has never used smokeless tobacco. She reports that she does not drink alcohol or use illicit drugs.    Review of Systems       Lipids: Unknown, followed by PCP       No results found for: CHOL  She is currently taking amlodipine for hypertension  Gets short of breath on exertion which is recently better with using prednisone     Thyroid:  No  unusual fatigue. She had a thyroid biopsy in 6/14 for a left-sided thyroid nodule which was benign  Physical Examination:  BP 107/74 mmHg  Pulse 103  Temp(Src) 97.7 F (36.5 C)  Resp 14  Ht 5\' 7"  (1.702 m)  Wt 244 lb 3.2 oz (110.768 kg)  BMI 38.24 kg/m2  SpO2 99%  LMP 08/09/2011      no ankle edema present    ASSESSMENT:  Diabetes type 2, uncontrolled  Her blood sugars are fairly well controlled overall with occasional high postprandial readings However as discussed in history of present illness she is not monitoring readings after meals and off Currently does not appear to understand how to adjust Humalog based on meal size and carbohydrate intake Also not understanding how to adjust Lantus based on fasting blood sugar trend  Currently she is back on prednisone and appears to be acquiring a little more mealtime coverage However her recent creatinine level is back to normal and may be more to go back on Janumet She also has some difficulty losing weight especially with taking prednisone  Renal  insufficiency: Recent creatinine is back to normal   PLAN:   Continue taking Lantus in the evening and adjust the dose as discussed based on fasting blood sugar trend over 3 days  Discussed blood sugar targets  May need to adjust her mealtime dose based on type of foods and carbohydrates that she is eating  More postprandial readings especially after supper  Restart Janumet XR 100/1000 daily  Follow-up in 2 months  Counseling time over 50% of today's 25 minute visit  , 01/12/2014, 11:10 AM   Note: This office note was prepared with Estate agent. Any transcriptional errors that result from this process are unintentional.

## 2014-01-13 ENCOUNTER — Other Ambulatory Visit: Payer: Self-pay | Admitting: Nurse Practitioner

## 2014-01-13 NOTE — Progress Notes (Signed)
Quick Note:  Please let patient know that A1c is better at 5.5 but her liver tests are high. Need to send labs to PCP and other physicians involved  ______

## 2014-02-25 ENCOUNTER — Telehealth: Payer: Self-pay | Admitting: *Deleted

## 2014-02-25 NOTE — Telephone Encounter (Signed)
Called patient and left voice message for patient to call back and r/s 03/18/14 appointment with Dr Jaynee Eagles. Please schedule patient with Dr Jaynee Eagles as new patient.

## 2014-03-07 ENCOUNTER — Emergency Department (INDEPENDENT_AMBULATORY_CARE_PROVIDER_SITE_OTHER)
Admission: EM | Admit: 2014-03-07 | Discharge: 2014-03-07 | Disposition: A | Discharge status: Home / Self Care | Payer: Medicare Other | Source: Home / Self Care | Attending: Emergency Medicine | Admitting: Emergency Medicine

## 2014-03-07 ENCOUNTER — Encounter (HOSPITAL_COMMUNITY): Payer: Self-pay | Admitting: *Deleted

## 2014-03-07 DIAGNOSIS — H1033 Unspecified acute conjunctivitis, bilateral: Secondary | ICD-10-CM

## 2014-03-07 DIAGNOSIS — B370 Candidal stomatitis: Secondary | ICD-10-CM

## 2014-03-07 MED ORDER — CETIRIZINE HCL 10 MG PO TABS: 10.0000 mg | ORAL_TABLET | Freq: Every day | ORAL | Status: AC

## 2014-03-07 MED ORDER — POLYMYXIN B-TRIMETHOPRIM 10000-0.1 UNIT/ML-% OP SOLN: 1.0000 [drp] | OPHTHALMIC | Status: DC

## 2014-03-07 MED ORDER — NYSTATIN 100000 UNIT/ML MT SUSP: 500000.0000 [IU] | Freq: Four times a day (QID) | OROMUCOSAL | Status: DC

## 2014-03-07 NOTE — ED Notes (Signed)
Eyes gunky in AM since [place].  Has been cleaning them with saline.  Pain in R jaw, when she opens her mouth it feels tight.  Pain around her tongue.  Denies tooth issues.  Hurts a little when she swallows.

## 2014-03-07 NOTE — Discharge Instructions (Signed)
It hard to tell if you're eyes are due to an infection or allergies. Take Zyrtec daily for the next few weeks. Use the eyedrops as prescribed for the next week.  I think your mouth pain is due to thrush. Use the nystatin 4 times a day for the next week.  Follow-up here or with your PCP if no improvement in 2-3 days.

## 2014-03-07 NOTE — ED Provider Notes (Signed)
CSN: [id number]     Arrival date & time 03/07/14  0912 History   First MD Initiated Contact with Patient 03/07/14 [phone]     Chief Complaint  Patient presents with  . Conjunctivitis   (Consider location/radiation/quality/duration/timing/severity/associated sxs/prior Treatment) HPI  She is a 49 year old woman here for evaluation of eye irritation and mouth pain.  She reports for the last 3 days she has had significant matting of her eyes in the morning. She also reports some eye discomfort as well as intermittent blurred vision. She describes some redness. She has been washing them with saline with some improvement.  She also reports that in the last week she has developed some mouth pain. It is worse on the right side and along her gums. It is worse with opening her mouth wide. She also reports some soreness with swallowing. She denies any dental issues.  Past Medical History  Diagnosis Date  . Hypertension   . Brain bleed     07/04/11  . Pulmonary embolism     06/22/11  . Headache(784.0)   . Arthritis   . Anemia   . ALL (acute lymphoblastic leukemia)   . Leukemia   . Diabetes mellitus without complication   . Pneumonia    Past Surgical History  Procedure Laterality Date  . Cesarean section  2000  . Foot surgery    . Foot surgery    . Cesarean section    . Craniotomy    . [place] filter    . Bone marrow transplant    . Lung biopsy    . Eye surgery Right 2005    repair crossed eye   Family History  Problem Relation Age of Onset  . Pancreatic cancer Father   . Cancer Mother     colon  . Pancreatic cancer Mother   . Diabetes Neg Hx   . Asthma Son    History  Substance Use Topics  . Smoking status: Never Smoker   . Smokeless tobacco: Never Used  . Alcohol Use: No   OB History    No data available     Review of Systems As in history of present illness Allergies  Other  Home Medications   Prior to Admission medications   Medication Sig Start Date End Date  Taking? Authorizing Provider  acyclovir (ZOVIRAX) 800 MG tablet Take 1 tablet (800 mg total) by mouth 2 (two) times daily. . 08/27/13  Yes Concha Norway, MD  amitriptyline (ELAVIL) 10 MG tablet TAKE ONE TABLET BY MOUTH NIGHTLY AT BEDTIME  01/14/14  Yes Antony Contras, MD  amLODipine (NORVASC) 5 MG tablet Take 5 mg by mouth daily.   Yes Historical Provider, MD  Fluticasone-Salmeterol (ADVAIR DISKUS) 250-50 MCG/DOSE AEPB Inhale 1 puff into the lungs. 12/12/13 12/12/14 Yes Historical Provider, MD  folic acid (FOLVITE) 1 MG tablet Take 1 mg by mouth daily.  03/13/13 03/13/14 Yes Historical Provider, MD  insulin aspart (NOVOLOG) 100 UNIT/ML FlexPen Inject 10 Units into the skin 3 (three) times daily with meals. (If you have eaten more than 50% of your meal.) 08/27/13  Yes Concha Norway, MD  Insulin Glargine (LANTUS) 100 UNIT/ML Solostar Pen Inject 10 Units into the skin every morning. Sliding scale. 08/27/13  Yes Concha Norway, MD  LORazepam (ATIVAN) 0.5 MG tablet Take 1 tablet (0.5 mg total) by mouth 2 (two) times daily. Patient taking differently: Take 0.5 mg by mouth 2 (two) times daily as needed for anxiety or sleep.  12/01/13  Yes [employer]  Lona Kettle, MD  magnesium chloride (SLOW-MAG) 64 MG TBEC SR tablet Take 2 tablets by mouth 3 (three) times daily.  11/29/12  Yes Historical Provider, MD  montelukast (SINGULAIR) 10 MG tablet Take 10 mg by mouth. 12/12/13  Yes Historical Provider, MD  Multiple Vitamin (MULTIVITAMIN) tablet Take 1 tablet by mouth daily.   Yes Historical Provider, MD  predniSONE (DELTASONE) 10 MG tablet Take 10 mg by mouth daily. 12/12/13  Yes Historical Provider, MD  prochlorperazine (COMPAZINE) 5 MG tablet Take 5 mg by mouth every 6 (six) hours as needed (nausea).  11/29/12  Yes Historical Provider, MD  sulfamethoxazole-trimethoprim (BACTRIM DS) 800-160 MG per tablet Take 1 tablet by mouth every Monday, Wednesday, and Friday. Takes 1 tablet Monday-Wednesday-Friday 08/27/13  Yes Concha Norway, MD   tacrolimus (PROGRAF) 0.5 MG capsule Take 1.5 mg by mouth 2 (two) times daily.   Yes Historical Provider, MD  traMADol (ULTRAM) 50 MG tablet Take 1 tablet (50 mg total) by mouth every 6 (six) hours as needed for moderate pain. 09/24/13  Yes Concha Norway, MD  azithromycin (ZITHROMAX) 250 MG tablet Take 250 mg by mouth daily. For 30 days    Historical Provider, MD  benzonatate (TESSALON) 100 MG capsule Take 100 mg by mouth. 01/09/14   Historical Provider, MD  cetirizine (ZYRTEC) 10 MG tablet Take 1 tablet (10 mg total) by mouth daily. 03/07/14   Melony Overly, MD  nystatin (MYCOSTATIN) 100000 UNIT/ML suspension Take 5 mLs (500,000 Units total) by mouth 4 (four) times daily. 03/07/14   Melony Overly, MD  ondansetron (ZOFRAN ODT) 4 MG disintegrating tablet Take 1 tablet (4 mg total) by mouth every 8 (eight) hours as needed for nausea. 09/24/13   Concha Norway, MD  predniSONE (DELTASONE) 10 MG tablet Take 10 mg by mouth daily with breakfast.     Historical Provider, MD  trimethoprim-polymyxin b (POLYTRIM) ophthalmic solution Place 1 drop into both eyes every 4 (four) hours. For 7 days 03/07/14   Melony Overly, MD   BP 119/88 mmHg  Pulse 100  Temp(Src) 98.2 F (36.8 C) (Oral)  Resp 16  SpO2 99%  LMP 08/09/2011 Physical Exam  Constitutional: She is oriented to person, place, and time. She appears well-developed and well-nourished. No distress.  HENT:  Patches of white film on buccal mucosa and gum line. No pharyngeal erythema or exudate. No dental tenderness.  Eyes: EOM are normal. Pupils are equal, round, and reactive to light. Right eye exhibits discharge. Left eye exhibits discharge. Right conjunctiva is injected. Left conjunctiva is injected.  Cardiovascular: Normal rate.   Pulmonary/Chest: Effort normal.  Neurological: She is alert and oriented to person, place, and time.    ED Course  Procedures (including critical care time) Labs Review Labs Reviewed - No data to display  Imaging Review No  results found.   MDM   1. Conjunctivitis, acute, bilateral   2. Oral thrush    Polytrim eyedrops and Zyrtec for conjunctivitis. Nystatin for oral thrush. Follow-up here or with PCP in 3 days if no improvement.    Melony Overly, MD 03/07/14 1006

## 2014-03-10 ENCOUNTER — Ambulatory Visit (INDEPENDENT_AMBULATORY_CARE_PROVIDER_SITE_OTHER): Payer: Medicare Other | Admitting: Neurology

## 2014-03-10 ENCOUNTER — Encounter: Payer: Self-pay | Admitting: Neurology

## 2014-03-10 VITALS — BP 123/89 | HR 93 | Ht 67.0 in | Wt 247.5 lb

## 2014-03-10 DIAGNOSIS — G441 Vascular headache, not elsewhere classified: Secondary | ICD-10-CM

## 2014-03-10 MED ORDER — AMITRIPTYLINE HCL 10 MG PO TABS: 10.0000 mg | ORAL_TABLET | Freq: Every day | ORAL | Status: DC

## 2014-03-10 MED ORDER — GABAPENTIN 300 MG PO CAPS: 300.0000 mg | ORAL_CAPSULE | Freq: Three times a day (TID) | ORAL | Status: DC

## 2014-03-10 NOTE — Progress Notes (Signed)
[facility]    Provider:  Dr Jaynee Eagles Referring Provider: Donnie Coffin, MD Primary Care Physician:  Lynn Coffin, MD  CC:  Headache  HPI:  Lynn [name] is a 49 y.o. female here as a follow up. She is a former patient of Dr. Leonie [name] and is transitioning to me. She has chronic daily headaches since June 2013 following intracerebral hemorrhage and treatment with chemotherapy for leukemia.   She is still on Amitriptyline which works well. She is having shooting pain on the left(points to the occipital area). Happens a few days a week. Sharp, brief, throbbing, lightning bolt. It doesn't happen all day. It is a few times a week. Sesitive to the touch. She has tenderness a few hours a day in another spot where the drain was, feels like pressure, sore and tender to the touch on the right frontal area behind the hairline. The pain started this winter. 3-4 times a week. She has taken ibuprofen and it doesn't really help.   Dr. Leonie [name] and Charlott [name] 09/15/2013: She returns for followup after her initial consultation with me on 04/09/12. She did not tolerate Topamax as it did not help and hence stopped it after a few weeks. She in fact develop worsening headache and was hospitalized at [facility] with headache and fever he had Topamax was discontinued and she was started on amitriptyline 10 mg at night. She was also given Phenergan for nausea seems to be working quite well. She also states that the chemotherapy dose has been reduced and that may have helped as the headaches have practically disappeared. She hasn't had headache only one day in the last 4 weeks. She did discontinue the Imitrex , oxycodone and Fioricet. 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 Lab work done on 04/09/12 showed normal ESR, ANA panel, B12, blood chemistries. TSH was  slightly suppressed at 0.474miu/ml.   Ms. HBushongreturns for headache revisit, states her headaches have been manageable, except she had one severe headache in July with nausea, vomiting and diarrhea, tested positive for cdiff. Feeling better now. Tolerating Elavil at night. Has new complaint os tinging in medial thighs when she puts chin to chest. No associated pain or numbness.  09/12/2012 (PS): She returns for followup after last visit on 06/06/12. She states she is doing well and has only occasional minor headaches off and on. She does take Imitrex which seems to help. She uses only once every 2 weeks or so. She continues to take amitriptyline 10 mg at night and seems to tolerate it well. She has had trouble sleeping in recent weeks. She also has some minor sinus headaches her once a week which are not disabling.   12/13/12 (LL): Ms. HTreiberreturns for 3 month revisit for headaches. She was [facility]. She was found in hospital to have thin subdural hematomas along the falx, tentorium and cerebral convexities with a small volume of subarachnoid hemorrhage along the sulci of the temporal and frontal lobes. She had another round of chemotherapy and allograft bone marrow transplant; her blood counts are improving. She is not have many headaches, sometimes only sharp pain that is momentary. She is tolerating Elavil well but did not increase to 10 mg for fear of drowsiness. She takes Imitrex infrequently, but states it helps when she  has to take it.    Review of Systems: Patient complains of symptoms per HPI as well as the following symptoms:feeling cold, headache, . Pertinent negatives per HPI. All others negative.   History   Social History  . Marital Status: Divorced    Spouse Name: N/A    Number of Children: 2  . Years of Education: Associates   Occupational History  . N/A    Social History Main Topics  . Smoking status:  Never Smoker   . Smokeless tobacco: Never Used  . Alcohol Use: No  . Drug Use: No  . Sexual Activity: Not Currently    Birth Control/ Protection: Abstinence, None   Other Topics Concern  . Not on file   Social History Narrative   Pt lives with her 2 children.    Associates degree   Caffeine Use: 8oz coffee/day     Family History  Problem Relation Age of Onset  . Pancreatic cancer Father   . Cancer Mother     colon  . Pancreatic cancer Mother   . Diabetes Neg Hx   . Asthma Son   . Hypertension Sister     Past Medical History  Diagnosis Date  . Hypertension   . Brain bleed     07/04/11  . Pulmonary embolism     06/22/11  . Headache(784.0)   . Arthritis   . Anemia   . ALL (acute lymphoblastic leukemia)   . Leukemia   . Diabetes mellitus without complication   . Pneumonia     Past Surgical History  Procedure Laterality Date  . Cesarean section  2000  . Foot surgery    . Craniotomy  07/04/11  . [place] filter    . Bone marrow transplant    . Lung biopsy  06/19/13  . Eye surgery Right 2005    repair crossed eye    Current Outpatient Prescriptions  Medication Sig Dispense Refill  . acyclovir (ZOVIRAX) 800 MG tablet Take 1 tablet (800 mg total) by mouth 2 (two) times daily. . 60 tablet 5  . amitriptyline (ELAVIL) 10 MG tablet TAKE ONE TABLET BY MOUTH NIGHTLY AT BEDTIME  90 tablet 0  . amLODipine (NORVASC) 5 MG tablet Take 5 mg by mouth daily.    . benzonatate (TESSALON) 100 MG capsule Take 100 mg by mouth.    . cetirizine (ZYRTEC) 10 MG tablet Take 1 tablet (10 mg total) by mouth daily. 30 tablet 0  . Fluticasone-Salmeterol (ADVAIR DISKUS) 250-50 MCG/DOSE AEPB Inhale 1 puff into the lungs.    . folic acid (FOLVITE) 1 MG tablet Take 1 mg by mouth daily.     . insulin aspart (NOVOLOG) 100 UNIT/ML FlexPen Inject 10 Units into the skin 3 (three) times daily with meals. (If you have eaten more than 50% of your meal.)    . Insulin Glargine (LANTUS) 100 UNIT/ML Solostar  Pen Inject 10 Units into the skin every morning. Sliding scale.    Lynn [name] LORazepam (ATIVAN) 1 MG tablet Take 1 mg by mouth.    . magnesium chloride (SLOW-MAG) 64 MG TBEC SR tablet Take 2 tablets by mouth 3 (three) times daily.     . Multiple Vitamin (MULTIVITAMIN) tablet Take 1 tablet by mouth daily.    Lynn [name] nystatin (MYCOSTATIN) 100000 UNIT/ML suspension Take 5 mLs (500,000 Units total) by mouth 4 (four) times daily. 240 mL 0  . ondansetron (ZOFRAN ODT) 4 MG disintegrating tablet Take 1 tablet (4 mg total) by mouth every 8 (  eight) hours as needed for nausea. 30 tablet 1  . predniSONE (DELTASONE) 10 MG tablet Take 10 mg by mouth every other day.     . prochlorperazine (COMPAZINE) 5 MG tablet Take 5 mg by mouth every 6 (six) hours as needed (nausea).     . SitaGLIPtin-MetFORMIN HCl 50-1000 MG TB24 Take 1 tablet by mouth.    . sulfamethoxazole-trimethoprim (BACTRIM DS) 800-160 MG per tablet Take 1 tablet by mouth every Monday, Wednesday, and Friday. Takes 1 tablet Monday-Wednesday-Friday 30 tablet 3  . tacrolimus (PROGRAF) 0.5 MG capsule Take 1.5 mg by mouth 2 (two) times daily.    . traMADol (ULTRAM) 50 MG tablet Take 1 tablet (50 mg total) by mouth every 6 (six) hours as needed for moderate pain. 30 tablet 0  . trimethoprim-polymyxin b (POLYTRIM) ophthalmic solution Place 1 drop into both eyes every 4 (four) hours. For 7 days 10 mL 0  . azithromycin (ZITHROMAX) 250 MG tablet Take 250 mg by mouth daily. For 30 days    . montelukast (SINGULAIR) 10 MG tablet Take 10 mg by mouth.    . predniSONE (DELTASONE) 10 MG tablet Take 10 mg by mouth daily.     No current facility-administered medications for this visit.    Allergies as of 03/10/2014 - Review Complete 03/10/2014  Allergen Reaction Noted  . Other Rash 05/28/2013    Vitals: BP 123/89 mmHg  Pulse 93  Ht '5\' 7"'  (1.702 m)  Wt 247 lb 8 oz (112.265 kg)  BMI 38.75 kg/m2  LMP 08/09/2011 Last Weight:  Wt Readings from Last 1 Encounters:  03/10/14  247 lb 8 oz (112.265 kg)   Last Height:   Ht Readings from Last 1 Encounters:  03/10/14 '5\' 7"'  (1.702 m)   Physical exam: Exam: Gen: NAD, conversant, well nourised, obese, well groomed                     Eyes: Conjunctivae clear without exudates or hemorrhage  Neuro: Detailed Neurologic Exam  Speech:    Speech is normal; fluent and spontaneous with normal comprehension.  Cognition:    The patient is oriented to person, place, and time;     recent and remote memory intact;     language fluent;     normal attention, concentration,     fund of knowledge Cranial Nerves:    The pupils are equal, round, and reactive to light. The fundi are normal and spontaneous venous pulsations are present. Visual fields are full to finger confrontation. Extraocular movements are intact. Trigeminal sensation is intact and the muscles of mastication are normal. The face is symmetric. The palate elevates in the midline. Hearing intact. Voice is normal. Shoulder shrug is normal. The tongue has normal motion without fasciculations.   Motor Observation:    No asymmetry, no atrophy, and no involuntary movements noted. Tone:    Normal muscle tone.    Posture:    Posture is normal. normal erect    Strength:    Strength is V/V in the upper and lower limbs.      Assessment/Plan:  49 year old lady with history of ALL with new chronic headaches since June 2013 which seems to have improved over the last couple of months. Exact etiology remains unclear but likely mixed transformed migraine and tension headaches with possible contribution from chemotherapy. ALL again in remission.  PLAN:  Continue amitriptyline 10 mg at night for headache prophylaxis since it seems to be tolerated and working well. Continue to  use imitrex for headache and Phenergan as needed for nausea. Will also start neurontin as needed for the atypical neuralgia she is experiencing in the left occipital and right frontal areas.  Return for  followup in 4-6 months or call earlier if necessary.    Sarina Ill, MD  [facility] [address] [place], [place] [address]  Phone [phone] Fax [phone]  A total of 30 minutes was spent face-to-face with this patient. Over half this time was spent on counseling patient on the headache diagnosis and different diagnostic and therapeutic options available.

## 2014-03-10 NOTE — Patient Instructions (Signed)
Overall you are doing fairly well but I do want to suggest a few things today:   Remember to drink plenty of fluid, eat healthy meals and do not skip any meals. Try to eat protein with a every meal and eat a healthy snack such as fruit or nuts in between meals. Try to keep a regular sleep-wake schedule and try to exercise daily, particularly in the form of walking, 20-30 minutes a day, if you can.   As far as your medications are concerned, I would like to suggest: starte neurontin 300mg  up to 3x a day as needed.   I would like to see you back in 6 months, sooner if we need to. Please call us with any interim questions, concerns, problems, updates or refill requests.   Please also call us for any test results so we can go over those with you on the phone.  My clinical assistant and will answer any of your questions and relay your messages to me and also relay most of my messages to you.   Our phone number is [phone]. We also have an after hours call service for urgent matters and there is a physician on-call for urgent questions. For any emergencies you know to call 911 or go to the nearest emergency room

## 2014-03-16 ENCOUNTER — Ambulatory Visit: Payer: Medicare Other | Admitting: Endocrinology

## 2014-03-18 ENCOUNTER — Ambulatory Visit: Payer: Medicaid Other | Admitting: Neurology

## 2014-03-18 NOTE — Telephone Encounter (Signed)
Patient was seen by Dr Jaynee Eagles on 03/10/14

## 2014-04-09 ENCOUNTER — Other Ambulatory Visit: Payer: Self-pay | Admitting: Hematology and Oncology

## 2014-04-09 ENCOUNTER — Ambulatory Visit (HOSPITAL_BASED_OUTPATIENT_CLINIC_OR_DEPARTMENT_OTHER): Payer: Medicare Other | Admitting: Hematology and Oncology

## 2014-04-09 ENCOUNTER — Other Ambulatory Visit (HOSPITAL_BASED_OUTPATIENT_CLINIC_OR_DEPARTMENT_OTHER): Payer: Medicare Other

## 2014-04-09 ENCOUNTER — Telehealth: Payer: Self-pay | Admitting: *Deleted

## 2014-04-09 ENCOUNTER — Encounter: Payer: Self-pay | Admitting: Hematology and Oncology

## 2014-04-09 VITALS — BP 119/75 | HR 101 | Temp 98.3°F | Resp 20 | Ht 67.0 in | Wt 244.8 lb

## 2014-04-09 DIAGNOSIS — C9101 Acute lymphoblastic leukemia, in remission: Secondary | ICD-10-CM

## 2014-04-09 DIAGNOSIS — Z86711 Personal history of pulmonary embolism: Secondary | ICD-10-CM

## 2014-04-09 DIAGNOSIS — B3781 Candidal esophagitis: Secondary | ICD-10-CM

## 2014-04-09 DIAGNOSIS — D89811 Chronic graft-versus-host disease: Secondary | ICD-10-CM

## 2014-04-09 DIAGNOSIS — J8489 Other specified interstitial pulmonary diseases: Secondary | ICD-10-CM

## 2014-04-09 DIAGNOSIS — Z9481 Bone marrow transplant status: Secondary | ICD-10-CM

## 2014-04-09 DIAGNOSIS — B370 Candidal stomatitis: Secondary | ICD-10-CM | POA: Diagnosis not present

## 2014-04-09 DIAGNOSIS — D72819 Decreased white blood cell count, unspecified: Secondary | ICD-10-CM

## 2014-04-09 HISTORY — DX: Candidal stomatitis: B37.0

## 2014-04-09 LAB — COMPREHENSIVE METABOLIC PANEL (CC13)
ALBUMIN: 3.2 g/dL — AB (ref 3.5–5.0)
ALK PHOS: 308 U/L — AB (ref 40–150)
ALT: 376 U/L (ref 0–55)
AST: 346 U/L (ref 5–34)
Anion Gap: 10 mEq/L (ref 3–11)
BILIRUBIN TOTAL: 1.34 mg/dL — AB (ref 0.20–1.20)
BUN: 13.5 mg/dL (ref 7.0–26.0)
CO2: 23 mEq/L (ref 22–29)
Calcium: 9 mg/dL (ref 8.4–10.4)
Chloride: 108 mEq/L (ref 98–109)
Creatinine: 1 mg/dL (ref 0.6–1.1)
EGFR: 81 mL/min/{1.73_m2} — ABNORMAL LOW (ref >90)
GLUCOSE: 110 mg/dL (ref 70–140)
Potassium: 3.8 mEq/L (ref 3.5–5.1)
SODIUM: 141 meq/L (ref 136–145)
Total Protein: 6.1 g/dL — ABNORMAL LOW (ref 6.4–8.3)

## 2014-04-09 LAB — MORPHOLOGY: PLT EST: ADEQUATE

## 2014-04-09 LAB — CBC WITH DIFFERENTIAL/PLATELET
BASO%: 0.7 % (ref 0.0–2.0)
Basophils Absolute: 0 10*3/uL (ref 0.0–0.1)
EOS%: 12.3 % — AB (ref 0.0–7.0)
Eosinophils Absolute: 0.5 10*3/uL (ref 0.0–0.5)
HCT: 38.3 % (ref 34.8–46.6)
HEMOGLOBIN: 13.1 g/dL (ref 11.6–15.9)
LYMPH%: 45.3 % (ref 14.0–49.7)
MCH: 32.1 pg (ref 25.1–34.0)
MCHC: 34.2 g/dL (ref 31.5–36.0)
MCV: 93.9 fL (ref 79.5–101.0)
MONO#: 0.7 10*3/uL (ref 0.1–0.9)
MONO%: 17.1 % — AB (ref 0.0–14.0)
NEUT%: 24.6 % — AB (ref 38.4–76.8)
NEUTROS ABS: 1 10*3/uL — AB (ref 1.5–6.5)
Platelets: 161 10*3/uL (ref 145–400)
RBC: 4.08 10*6/uL (ref 3.70–5.45)
RDW: 15.4 % — ABNORMAL HIGH (ref 11.2–14.5)
WBC: 4.2 10*3/uL (ref 3.9–10.3)
lymph#: 1.9 10*3/uL (ref 0.9–3.3)

## 2014-04-09 LAB — LACTATE DEHYDROGENASE (CC13): LDH: 359 U/L — AB (ref 125–245)

## 2014-04-09 MED ORDER — FLUCONAZOLE 100 MG PO TABS: 100.0000 mg | ORAL_TABLET | Freq: Every day | ORAL | Status: DC

## 2014-04-09 NOTE — Assessment & Plan Note (Signed)
She is not on chronic anticoagulation therapy due to history of subdural hematoma. Recent CT angiogram dated 12/02/2013 was negative for PE.

## 2014-04-09 NOTE — Assessment & Plan Note (Signed)
She have recent nystatin recently. I recommend she starts fluconazole today.

## 2014-04-09 NOTE — Progress Notes (Signed)
[facility] OFFICE PROGRESS NOTE  Patient Care Team: Lynn Coffin, MD as PCP - General (Family Medicine)  SUMMARY OF ONCOLOGIC HISTORY:  CHIEF COMPLAINTS/PURPOSE OF VISIT:  Lynn [name], status post allogenic stem cell transplant  HISTORY OF PRESENTING ILLNESS:  Lynn Lynn [name] 49 y.o. female was transferred to my care after her prior physician has left.  I reviewed the patient's records extensive and collaborated the history with the patient. Summary of her history is as follows: The patient was diagnosed with precursor B-cell Lynn [name], Philadelphia chromosome positive. She initially presented in 2013 with leg swelling and was found to have pulmonary emboli. In the course of workup, she was found to have Lynn [name]. Her anticoagulation therapy was discontinued when she developed complication during chemotherapy with subdural hematoma requiring placement of drainage. She received autologous stem cell transplant in October 2013 and had relapse. Subsequently, on 02/06/2013, she has unrelated allogeneic stem cell transplant.  Her post transplant course was complicated by C. Difficile positive diarrhea treated with oral vancomycin, pneumonia likely bacterial.  CXR with new patchy opacities treated with zosyn and moxifloxacin and acute kidney injury secondary to dehydration from diarrhea and ATN which improved with hydration.   Recently she has recurrence of pneumonia. CT angiogram on 12/02/2013 showed no evidence of PE. Dose of her prednisone was increased and currently she is on azithromycin for chronic suppressive therapy. 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 She was noted to have elevated liver enzymes and liver biopsy last week, suspicious for graft-versus-host disease involving the liver  INTERVAL HISTORY: Please see below for problem oriented charting. Her shortness of breath has improved. No recent cough. She complained of recurrent mucositis and yeast  infection of her mouth and painful swallowing. She complained of dry skin and itchiness of the skin.  REVIEW OF SYSTEMS:   Constitutional: Denies fevers, chills or abnormal weight loss Eyes: Denies blurriness of vision Respiratory: Denies cough, dyspnea or wheezes Cardiovascular: Denies palpitation, chest discomfort or lower extremity swelling Gastrointestinal:  Denies nausea, heartburn or change in bowel habits Lymphatics: Denies new lymphadenopathy or easy bruising Neurological:Denies numbness, tingling or new weaknesses Behavioral/Psych: Mood is stable, no new changes  Lynn [name] other systems were reviewed with the patient and are negative.  I have reviewed the past medical history, past surgical history, social history and family history with the patient and they are unchanged from previous note.  ALLERGIES:  is allergic to other.  MEDICATIONS:  Current Outpatient Prescriptions  Medication Sig Dispense Refill  . acyclovir (ZOVIRAX) 800 MG tablet Take 1 tablet (800 mg total) by mouth 2 (two) times daily. . 60 tablet 5  . amitriptyline (ELAVIL) 10 MG tablet Take 1 tablet (10 mg total) by mouth at bedtime. 90 tablet 3  . amLODipine (NORVASC) 5 MG tablet Take 5 mg by mouth daily.    . cetirizine (ZYRTEC) 10 MG tablet Take 1 tablet (10 mg total) by mouth daily. (Patient taking differently: Take 10 mg by mouth daily as needed. ) 30 tablet 0  . Fluticasone-Salmeterol (ADVAIR DISKUS) 250-50 MCG/DOSE AEPB Inhale 1 puff into the lungs.    . insulin aspart (NOVOLOG) 100 UNIT/ML FlexPen Inject 10 Units into the skin 3 (three) times daily with meals. (If you have eaten more than 50% of your meal.)    . Insulin Glargine (LANTUS) 100 UNIT/ML Solostar Pen Inject 10 Units into the skin every morning. Sliding scale.    Marland Kitchen LORazepam (ATIVAN) 1 MG tablet Take 1 mg  by mouth.    . magnesium chloride (SLOW-MAG) 64 MG TBEC SR tablet Take 2 tablets by mouth 3 (three) times daily.     . montelukast (SINGULAIR) 10  MG tablet Take 10 mg by mouth.    . Multiple Vitamin (MULTIVITAMIN) tablet Take 1 tablet by mouth daily.    . ondansetron (ZOFRAN ODT) 4 MG disintegrating tablet Take 1 tablet (4 mg total) by mouth every 8 (eight) hours as needed for nausea. 30 tablet 1  . predniSONE (DELTASONE) 10 MG tablet Take 20 mg by mouth daily.     . prochlorperazine (COMPAZINE) 5 MG tablet Take 5 mg by mouth every 6 (six) hours as needed (nausea).     . SitaGLIPtin-MetFORMIN HCl 50-1000 MG TB24 Take 1 tablet by mouth.    . sulfamethoxazole-trimethoprim (BACTRIM DS) 800-160 MG per tablet Take 1 tablet by mouth every Monday, Wednesday, and Friday. Takes 1 tablet Monday-Wednesday-Friday 30 tablet 3  . tacrolimus (PROGRAF) 0.5 MG capsule Take 1.5 mg by mouth 2 (two) times daily.    . traMADol (ULTRAM) 50 MG tablet Take 1 tablet (50 mg total) by mouth every 6 (six) hours as needed for moderate pain. 30 tablet 0  . trimethoprim-polymyxin b (POLYTRIM) ophthalmic solution Place 1 drop into both eyes every 4 (four) hours. For 7 days 10 mL 0  . fluconazole (DIFLUCAN) 100 MG tablet Take 1 tablet (100 mg total) by mouth daily. 7 tablet 0   No current facility-administered medications for this visit.    PHYSICAL EXAMINATION: ECOG PERFORMANCE STATUS: 1 - Symptomatic but completely ambulatory  Filed Vitals:   04/09/14 0908  BP: 119/75  Pulse: 101  Temp: 98.3 F (36.8 C)  Resp: 20   Filed Weights   04/09/14 0908  Weight: 244 lb 12.8 oz (111.041 kg)    GENERAL:alert, no distress and comfortable. She is morbidly obese and mildly cushingoid SKIN: skin color, texture, turgor are normal, no rashes or significant lesions EYES: normal, Conjunctiva are pink and non-injected, sclera clear OROPHARYNX:no exudate, no erythema and lips, buccal mucosa, and tongue normal . Noted white coating on her tongue, likely partially treated thrush NECK: supple, thyroid normal size, non-tender, without nodularity LYMPH:  no palpable  lymphadenopathy in the cervical, axillary or inguinal LUNGS: clear to auscultation and percussion with normal breathing effort HEART: regular rate & rhythm and no murmurs and no lower extremity edema ABDOMEN:abdomen soft, non-tender and normal bowel sounds Musculoskeletal:no cyanosis of digits and no clubbing  NEURO: alert & oriented x 3 with fluent speech, no focal motor/sensory deficits  LABORATORY DATA:  I have reviewed the data as listed    Component Value Date/Time   NA 137 01/12/2014 1144   NA 140 12/08/2013 1144   K 3.6 01/12/2014 1144   K 3.6 12/08/2013 1144   CL 105 01/12/2014 1144   CL 106 07/09/2012 0949   CO2 28 01/12/2014 1144   CO2 27 12/08/2013 1144   GLUCOSE 126* 01/12/2014 1144   GLUCOSE 125 12/08/2013 1144   GLUCOSE 109* 07/09/2012 0949   BUN 15 01/12/2014 1144   BUN 9.6 12/08/2013 1144   CREATININE 1.1 01/12/2014 1144   CREATININE 1.2* 12/08/2013 1144   CALCIUM 9.0 01/12/2014 1144   CALCIUM 9.6 12/08/2013 1144   PROT 6.3 01/12/2014 1144   PROT 6.5 12/08/2013 1144   ALBUMIN 3.8 01/12/2014 1144   ALBUMIN 3.4* 12/08/2013 1144   AST 90* 01/12/2014 1144   AST 24 12/08/2013 1144   ALT 96* 01/12/2014 1144  ALT 26 12/08/2013 1144   ALKPHOS 128* 01/12/2014 1144   ALKPHOS 178* 12/08/2013 1144   BILITOT 0.3 01/12/2014 1144   BILITOT 0.28 12/08/2013 1144   GFRNONAA 60* 12/28/2013 0815   GFRAA 69* 12/28/2013 0815    No results found for: SPEP, UPEP  Lab Results  Component Value Date   WBC 4.2 04/09/2014   NEUTROABS 1.0* 04/09/2014   HGB 13.1 04/09/2014   HCT 38.3 04/09/2014   MCV 93.9 04/09/2014   PLT 161 04/09/2014      Chemistry      Component Value Date/Time   NA 137 01/12/2014 1144   NA 140 12/08/2013 1144   K 3.6 01/12/2014 1144   K 3.6 12/08/2013 1144   CL 105 01/12/2014 1144   CL 106 07/09/2012 0949   CO2 28 01/12/2014 1144   CO2 27 12/08/2013 1144   BUN 15 01/12/2014 1144   BUN 9.6 12/08/2013 1144   CREATININE 1.1 01/12/2014 1144    CREATININE 1.2* 12/08/2013 1144      Component Value Date/Time   CALCIUM 9.0 01/12/2014 1144   CALCIUM 9.6 12/08/2013 1144   ALKPHOS 128* 01/12/2014 1144   ALKPHOS 178* 12/08/2013 1144   AST 90* 01/12/2014 1144   AST 24 12/08/2013 1144   ALT 96* 01/12/2014 1144   ALT 26 12/08/2013 1144   BILITOT 0.3 01/12/2014 1144   BILITOT 0.28 12/08/2013 1144      ASSESSMENT & PLAN:  Acute lymphoblastic leukemia in remission Clinically, she has no signs of recurrence of disease. She has appointment to see her transplant physician soon. In the meantime, I plan to see her periodically for supportive care   History of pulmonary embolism She is not on chronic anticoagulation therapy due to history of subdural hematoma. Recent CT angiogram dated 12/02/2013 was negative for PE.   Leukopenia This is due to her immunosuppressive therapy. Her platelet levels are currently being monitored by her transplant physician.   BOOP (bronchiolitis obliterans with organizing pneumonia) The cause is likely due to port transplant GVH She is currently on chronic suppressive antibiotic therapy for several months. Recent chest x-ray is stable and her shortness of breath has improved. She follows closely with pulmonologist for this.   S/P allogeneic bone marrow transplant She is on prednisone and Prograf as part of her immunosuppressive therapy. She will continue Zithromax for now for recent infection. She had received recent post transplant vaccination at the transplant center at [facility]     Chronic graft-versus-host disease Apparently, she have chronic graft-versus-host disease affecting her skin, her lung and her liver. She is on immunosuppressive therapy for this. I will defer to her transplant physician for management.   Thrush of mouth and esophagus She have recent nystatin recently. I recommend she starts fluconazole today.    No orders of the defined types were placed in  this encounter.   Lynn [name] questions were answered. The patient knows to call the clinic with any problems, questions or concerns. No barriers to learning was detected. I spent 25 minutes counseling the patient face to face. The total time spent in the appointment was 30 minutes and more than 50% was on counseling and review of test results     [facility], [place], MD 04/09/2014 9:33 AM

## 2014-04-09 NOTE — Assessment & Plan Note (Signed)
Apparently, she have chronic graft-versus-host disease affecting her skin, her lung and her liver. She is on immunosuppressive therapy for this. I will defer to her transplant physician for management.

## 2014-04-09 NOTE — Assessment & Plan Note (Signed)
Clinically, she has no signs of recurrence of disease. She has appointment to see her transplant physician soon. In the meantime, I plan to see her periodically for supportive care

## 2014-04-09 NOTE — Assessment & Plan Note (Signed)
This is due to her immunosuppressive therapy. Her platelet levels are currently being monitored by her transplant physician.

## 2014-04-09 NOTE — Telephone Encounter (Signed)
Faxed office notes and labs from today to Dr. Harvel Ricks at [facility] fax [phone].

## 2014-04-09 NOTE — Telephone Encounter (Signed)
-----   Message from Heath Lark, MD sent at 04/09/2014  9:57 AM EST ----- Regarding: fax results [place] fax results to [facility] attention to Dr. Harvel Ricks. You can probably let Urban Gibson know also ----- Message -----    From: [facility]    Sent: 04/09/2014   9:04 AM      To: Heath Lark, MD

## 2014-04-09 NOTE — Assessment & Plan Note (Signed)
The cause is likely due to port transplant GVH She is currently on chronic suppressive antibiotic therapy for several months. Recent chest x-ray is stable and her shortness of breath has improved. She follows closely with pulmonologist for this.

## 2014-04-09 NOTE — Assessment & Plan Note (Signed)
She is on prednisone and Prograf as part of her immunosuppressive therapy. She will continue Zithromax for now for recent infection. She had received recent post transplant vaccination at the transplant center at [facility]

## 2014-04-15 ENCOUNTER — Ambulatory Visit (INDEPENDENT_AMBULATORY_CARE_PROVIDER_SITE_OTHER): Payer: Medicare Other | Admitting: Endocrinology

## 2014-04-15 ENCOUNTER — Encounter: Payer: Self-pay | Admitting: Endocrinology

## 2014-04-15 VITALS — BP 112/70 | HR 112 | Temp 99.0°F | Resp 12 | Wt 242.0 lb

## 2014-04-15 DIAGNOSIS — E1165 Type 2 diabetes mellitus with hyperglycemia: Secondary | ICD-10-CM | POA: Diagnosis not present

## 2014-04-15 DIAGNOSIS — IMO0002 Reserved for concepts with insufficient information to code with codable children: Secondary | ICD-10-CM

## 2014-04-15 NOTE — Progress Notes (Signed)
Patient ID: Lynn [name], female   DOB: [birth year], 49 y.o.   MRN: [id number]    Reason for Appointment: Followup for Type 2 Diabetes  Referring physician: Donnie Coffin  History of Present Illness:          Diagnosis: Type 2 diabetes mellitus, date of diagnosis: 2013        Past history: She weighed nearly 300 lb in 2013 around that time her diabetes was diagnosed Although her A1c at that time was reported at 7.4 she does not remember being told about diabetes and no treatment was given In 5/15 when she was getting steroids for her bone marrow transplant her blood sugar was over 400 She was started on insulin and had been taking a basal bolus insulin regimen subsequently while on prednisone Although she required small amounts of insulin she could not taper off insulin even when off prednisone  Recent history:  She is back on prednisone again this month and is taking 20 mg daily over the last few days She also was taken off her Janumet because of liver dysfunction She is starting get significantly high blood sugars after her evening meal; however even previously was getting readings as high as 182 after evening meal No recent A1c available Current blood sugar patterns and problems:  Her fasting blood sugars are reasonably good with small doses of Lantus  Her blood sugars are variably high in the afternoons with some readings over 300 and this may be related to when she takes her prednisone.  Blood sugars are consistently high after evening meal for the last week or so with readings over 300  She did not understand how to adjust her insulin for supper and also not sure if she can take extra insulin postprandially when blood sugars are high  Unable to do much physical activity usually because of joint pains although more recently she is able to start walking       Oral hypoglycemic drugs the patient is taking are: None    Side effects from medications have been: Diarrhea from  Metformin over 1000 mg dose INSULIN regimen is described as: NovoLog 10 units with meals, Lantus  12 in a.m.  Glucose monitoring:  done one time a day         Glucometer:  Accu-Chek      PRE-MEAL Breakfast  11 AM   3-5 PM   7-11 PM  Overall  Glucose range:  86-109   88-92   80-347   71-376    Mean/median:     220   181     Glycemic control:   Lab Results  Component Value Date   HGBA1C 5.5 01/12/2014   HGBA1C 6.5 10/16/2013   HGBA1C 7.4* 05/30/2013   Lab Results  Component Value Date   CREATININE 1.0 04/09/2014    Self-care: The diet that the patient has been following is: tries to limit  Portions, may have some sweets    Meals: 2 meals per day. Light supper usually,  eating eggs or cereal with breakfast at 10 am. Supper 5 pm  Exercise: walking or housework, has joint pains        Dietician visit: Most recent: 5/15 at the hospital              Compliance with the medical regimen: Fair Retinal exam: Most recent:.2013     Weight history: Wt Readings from Last 3 Encounters:  04/15/14 242 lb (109.77 kg)  04/09/14 244 lb 12.8  oz (111.041 kg)  03/10/14 247 lb 8 oz (112.265 kg)      Medication List       This list is accurate as of: 04/15/14  4:43 PM.  Always use your most recent med list.               acyclovir 800 MG tablet  Commonly known as:  ZOVIRAX  Take 1 tablet (800 mg total) by mouth 2 (two) times daily. Marland Kitchen     ADVAIR DISKUS 250-50 MCG/DOSE Aepb  Generic drug:  Fluticasone-Salmeterol  Inhale 1 puff into the lungs.     amitriptyline 10 MG tablet  Commonly known as:  ELAVIL  Take 1 tablet (10 mg total) by mouth at bedtime.     amLODipine 5 MG tablet  Commonly known as:  NORVASC  Take 5 mg by mouth daily.     cetirizine 10 MG tablet  Commonly known as:  ZYRTEC  Take 1 tablet (10 mg total) by mouth daily.     fluconazole 100 MG tablet  Commonly known as:  DIFLUCAN  Take 1 tablet (100 mg total) by mouth daily.     insulin aspart 100 UNIT/ML FlexPen   Commonly known as:  NOVOLOG  Inject 10 Units into the skin 3 (three) times daily with meals. (If you have eaten more than 50% of your meal.)     Insulin Glargine 100 UNIT/ML Solostar Pen  Commonly known as:  LANTUS  Inject 10 Units into the skin every morning. Sliding scale.     LORazepam 1 MG tablet  Commonly known as:  ATIVAN  Take 1 mg by mouth.     multivitamin tablet  Take 1 tablet by mouth daily.     ondansetron 4 MG disintegrating tablet  Commonly known as:  ZOFRAN ODT  Take 1 tablet (4 mg total) by mouth every 8 (eight) hours as needed for nausea.     predniSONE 10 MG tablet  Commonly known as:  DELTASONE  Take 20 mg by mouth daily.     prochlorperazine 5 MG tablet  Commonly known as:  COMPAZINE  Take 5 mg by mouth every 6 (six) hours as needed (nausea).     SINGULAIR 10 MG tablet  Generic drug:  montelukast  Take 10 mg by mouth.     SitaGLIPtin-MetFORMIN HCl 50-1000 MG Tb24  Take 1 tablet by mouth.     SLOW-MAG 64 MG Tbec SR tablet  Generic drug:  magnesium chloride  Take 2 tablets by mouth 3 (three) times daily.     sulfamethoxazole-trimethoprim 800-160 MG per tablet  Commonly known as:  BACTRIM DS  Take 1 tablet by mouth every Monday, Wednesday, and Friday. Takes 1 tablet Monday-Wednesday-Friday     tacrolimus 0.5 MG capsule  Commonly known as:  PROGRAF  Take 1.5 mg by mouth 2 (two) times daily.     traMADol 50 MG tablet  Commonly known as:  ULTRAM  Take 1 tablet (50 mg total) by mouth every 6 (six) hours as needed for moderate pain.     trimethoprim-polymyxin b ophthalmic solution  Commonly known as:  POLYTRIM  Place 1 drop into both eyes every 4 (four) hours. For 7 days        Allergies:  Allergies  Allergen Reactions  . Other Rash    Please do not use clear tegaderm dressings as patient states they pull her skin off.  Mepilex is fine.    Past Medical History  Diagnosis Date  .  Hypertension   . Brain bleed     07/04/11  . Pulmonary  embolism     06/22/11  . Headache(784.0)   . Arthritis   . Anemia   . ALL (acute lymphoblastic leukemia)   . Leukemia   . Diabetes mellitus without complication   . Pneumonia   . Thrush of mouth and esophagus 04/09/2014    Past Surgical History  Procedure Laterality Date  . Cesarean section  2000  . Foot surgery    . Craniotomy  07/04/11  . [place] filter    . Bone marrow transplant    . Lung biopsy  06/19/13  . Eye surgery Right 2005    repair crossed eye    Family History  Problem Relation Age of Onset  . Pancreatic cancer Father   . Cancer Mother     colon  . Pancreatic cancer Mother   . Diabetes Neg Hx   . Asthma Son   . Hypertension Sister     Social History:  reports that she has never smoked. She has never used smokeless tobacco. She reports that she does not drink alcohol or use illicit drugs.    Review of Systems       Lipids: These are followed by PCP and tends to have high triglycerides and mildly increased LDL      Lab Results  Component Value Date   CHOL 208* 01/12/2014   HDL 62.30 01/12/2014   LDLDIRECT 109.3 01/12/2014   TRIG 258.0* 01/12/2014   CHOLHDL 3 01/12/2014    She is currently taking amlodipine for hypertension  She is being followed closely for her ALL      Thyroid:  No  unusual fatigue. She had a thyroid biopsy in 6/14 for a left-sided thyroid nodule which was benign  Physical Examination:  BP 112/70 mmHg  Pulse 112  Temp(Src) 99 F (37.2 C) (Oral)  Resp 12  Wt 242 lb (109.77 kg)  SpO2 98%  LMP 08/09/2011      no ankle edema present    ASSESSMENT:  Diabetes type 2, uncontrolled  Her blood sugars are much higher with starting prednisone 20 minute gram recently Most of these sugars are going up in the late afternoon and evening time Her blood sugar is not consistently higher after her morning meal and she is not eating at consistent times either. Today blood sugar was fairly good with breakfast but may need higher  doses with larger meals Currently fasting blood sugars are still excellent with her taking 12 units of Lantus   PLAN:   Continue taking Lantus 12 units and adjust the dose as discussed based on fasting blood sugar trend over 3 days  Increase suppertime dose by at least 6-8 units and further if blood sugars are still going up over 200 after the meal  She should not need to take extra insulin after her evening meal as blood sugars are usually improved overnight  May need to increase her coverage for her first meal or if she is eating a meal in the afternoon  To call if blood sugars are consistently controlled but will need to follow-up in one month  Also she will need to let us know when she is starting to reduce her prednisone again  Check A1c on the next visit  Balanced meals with some protein at each meal  Continue regular walking  Patient Instructions  Increase supper dose of Novolog to 18, more if sugar >200 after supper  Increase  Novolog in am to 12-14 if eating full meal   Counseling time over 50% of today's 25 minute visit  , 04/15/2014, 4:43 PM   Note: This office note was prepared with Dragon voice recognition system technology. Any transcriptional errors that result from this process are unintentional.

## 2014-04-15 NOTE — Patient Instructions (Signed)
Increase supper dose of Novolog to 18, more if sugar >200 after supper  Increase Novolog in am to 12-14 if eating full meal

## 2014-05-08 ENCOUNTER — Other Ambulatory Visit (INDEPENDENT_AMBULATORY_CARE_PROVIDER_SITE_OTHER): Payer: Medicare Other

## 2014-05-08 ENCOUNTER — Other Ambulatory Visit: Payer: Self-pay

## 2014-05-08 DIAGNOSIS — IMO0002 Reserved for concepts with insufficient information to code with codable children: Secondary | ICD-10-CM

## 2014-05-08 DIAGNOSIS — E1165 Type 2 diabetes mellitus with hyperglycemia: Secondary | ICD-10-CM | POA: Diagnosis not present

## 2014-05-08 LAB — HEMOGLOBIN A1C: Hgb A1c MFr Bld: 8.5 % — ABNORMAL HIGH (ref 4.6–6.5)

## 2014-05-08 LAB — BASIC METABOLIC PANEL
BUN: 18 mg/dL (ref 6–23)
CHLORIDE: 102 meq/L (ref 96–112)
CO2: 28 meq/L (ref 19–32)
CREATININE: 1.1 mg/dL (ref 0.40–1.20)
Calcium: 9.7 mg/dL (ref 8.4–10.5)
GFR: 67.71 mL/min (ref >60.00)
Glucose, Bld: 177 mg/dL — ABNORMAL HIGH (ref 70–99)
Potassium: 3.8 mEq/L (ref 3.5–5.1)
Sodium: 136 mEq/L (ref 135–145)

## 2014-05-13 ENCOUNTER — Encounter: Payer: Self-pay | Admitting: Endocrinology

## 2014-05-13 ENCOUNTER — Ambulatory Visit (INDEPENDENT_AMBULATORY_CARE_PROVIDER_SITE_OTHER): Payer: Medicare Other | Admitting: Endocrinology

## 2014-05-13 VITALS — BP 118/74 | HR 128 | Temp 98.1°F | Ht 67.0 in | Wt 242.0 lb

## 2014-05-13 DIAGNOSIS — E1165 Type 2 diabetes mellitus with hyperglycemia: Secondary | ICD-10-CM

## 2014-05-13 DIAGNOSIS — I10 Essential (primary) hypertension: Secondary | ICD-10-CM | POA: Diagnosis not present

## 2014-05-13 DIAGNOSIS — IMO0002 Reserved for concepts with insufficient information to code with codable children: Secondary | ICD-10-CM

## 2014-05-13 DIAGNOSIS — E785 Hyperlipidemia, unspecified: Secondary | ICD-10-CM | POA: Diagnosis not present

## 2014-05-13 NOTE — Progress Notes (Signed)
(Patient ID: Lynn [name], female   DOB: 11-01-1965, 49 y.o.   MRN: [id number]    Reason for Appointment: Followup for Type 2 Diabetes  Referring physician: Donnie Coffin  History of Present Illness:          Diagnosis: Type 2 diabetes mellitus, date of diagnosis: 2013        Past history: She weighed nearly 300 lb in 2013 around that time her diabetes was diagnosed Although her A1c at that time was reported at 7.4 she does not remember being told about diabetes and no treatment was given In 5/15 when she was getting steroids for her bone marrow transplant her blood sugar was over 400 She was started on insulin and had been taking a basal bolus insulin regimen subsequently while on prednisone Although she required small amounts of insulin she could not taper off insulin even when off prednisone  Recent history:  She is still on prednisone although has reduced her dose to 10 mg about 5 days ago On her last visit because of high postprandial readings she was advised to take NOVOLOG before her meals and increase the dose if postprandial readings were high She had a significantly high A1c now She also was taken off her Janumet because of liver dysfunction  Current blood sugar patterns and problems:  Her fasting blood sugars have not been checked  She has not understood the instructions for insulin and is only taking the NovoLog when the blood sugar goes up after eating  She is taking 16-18 units of NovoLog based on her blood sugar after meals, usually checked about 1 hour after eating; however in the morning she may take the NovoLog with her breakfast for convenience.  She has had one episode of hypoglycemia at 2:30 AM caused by her taking her NovoLog late in the evening  Even though she has reduced her prednisone the last few days she is still getting readings nearly 300 after meals  Not clear if she is consistent with her diet with modifying carbohydrate and sugar intake  She has  done a little walking and her weight is stable       Oral hypoglycemic drugs the patient is taking are: None    Side effects from medications have been: Diarrhea from Metformin over 1000 mg dose INSULIN regimen is described as: NovoLog 16-18 units with meals, Lantus  12 in a.m.  Glucose monitoring:  done one time a day         Glucometer:  Accu-Chek      PRE-MEAL Breakfast Lunch Dinner Bedtime Overall  Glucose range: ?    92-128   185     Mean/median:     225   POST-MEAL PC Breakfast PC Lunch PC Dinner  Glucose range:   224-392   207-427   Mean/median:      Glycemic control:   Lab Results  Component Value Date   HGBA1C 8.5* 05/08/2014   HGBA1C 5.5 01/12/2014   HGBA1C 6.5 10/16/2013   Lab Results  Component Value Date   CREATININE 1.10 05/08/2014    Self-care: The diet that the patient has been following is: tries to limit  Portions, may have some sweets    Meals: 2 meals per day. Light supper usually,  eating eggs or cereal with breakfast at 10 am. Supper 5 pm  Exercise: walking more     Dietician visit: Most recent: 5/15 at the hospital  Compliance with the medical regimen: Fair Retinal exam: Most recent:.2013     Weight history: Wt Readings from Last 3 Encounters:  05/13/14 242 lb (109.77 kg)  04/15/14 242 lb (109.77 kg)  04/09/14 244 lb 12.8 oz (111.041 kg)      Medication List       This list is accurate as of: 05/13/14 11:59 PM.  Always use your most recent med list.               acyclovir 800 MG tablet  Commonly known as:  ZOVIRAX  Take 1 tablet (800 mg total) by mouth 2 (two) times daily. Marland Kitchen     ADVAIR DISKUS 250-50 MCG/DOSE Aepb  Generic drug:  Fluticasone-Salmeterol  Inhale 1 puff into the lungs.     amitriptyline 10 MG tablet  Commonly known as:  ELAVIL  Take 1 tablet (10 mg total) by mouth at bedtime.     amLODipine 5 MG tablet  Commonly known as:  NORVASC  Take 5 mg by mouth daily.     cetirizine 10 MG tablet  Commonly  known as:  ZYRTEC  Take 1 tablet (10 mg total) by mouth daily.     fluconazole 100 MG tablet  Commonly known as:  DIFLUCAN  Take 1 tablet (100 mg total) by mouth daily.     insulin aspart 100 UNIT/ML FlexPen  Commonly known as:  NOVOLOG  Inject 10 Units into the skin 3 (three) times daily with meals. (If you have eaten more than 50% of your meal.)     Insulin Glargine 100 UNIT/ML Solostar Pen  Commonly known as:  LANTUS  Inject 10 Units into the skin every morning. Sliding scale.     LORazepam 1 MG tablet  Commonly known as:  ATIVAN  Take 1 mg by mouth.     multivitamin tablet  Take 1 tablet by mouth daily.     ondansetron 4 MG disintegrating tablet  Commonly known as:  ZOFRAN ODT  Take 1 tablet (4 mg total) by mouth every 8 (eight) hours as needed for nausea.     predniSONE 10 MG tablet  Commonly known as:  DELTASONE  Take 20 mg by mouth daily.     SINGULAIR 10 MG tablet  Generic drug:  montelukast  Take 10 mg by mouth.     SLOW-MAG 64 MG Tbec SR tablet  Generic drug:  magnesium chloride  Take 2 tablets by mouth 3 (three) times daily.     sulfamethoxazole-trimethoprim 800-160 MG per tablet  Commonly known as:  BACTRIM DS  Take 1 tablet by mouth every Monday, Wednesday, and Friday. Takes 1 tablet Monday-Wednesday-Friday     tacrolimus 0.5 MG capsule  Commonly known as:  PROGRAF  Take 1.5 mg by mouth 2 (two) times daily.     traMADol 50 MG tablet  Commonly known as:  ULTRAM  Take 1 tablet (50 mg total) by mouth every 6 (six) hours as needed for moderate pain.        Allergies:  Allergies  Allergen Reactions  . Other Rash    Please do not use clear tegaderm dressings as patient states they pull her skin off.  Mepilex is fine.    Past Medical History  Diagnosis Date  . Hypertension   . Brain bleed     07/04/11  . Pulmonary embolism     06/22/11  . Headache(784.0)   . Arthritis   . Anemia   . [name] (acute lymphoblastic leukemia)   .  Leukemia   . Diabetes  mellitus without complication   . Pneumonia   . Thrush of mouth and esophagus 04/09/2014    Past Surgical History  Procedure Laterality Date  . Cesarean section  2000  . Foot surgery    . Craniotomy  07/04/11  . [place] filter    . Bone marrow transplant    . Lung biopsy  06/19/13  . Eye surgery Right 2005    repair crossed eye    Family History  Problem Relation Age of Onset  . Pancreatic cancer Father   . Cancer Mother     colon  . Pancreatic cancer Mother   . Diabetes Neg Hx   . Asthma Son   . Hypertension Sister     Social History:  reports that she has never smoked. She has never used smokeless tobacco. She reports that she does not drink alcohol or use illicit drugs.    Review of Systems   She is asking about problems with tingling and burning in her hands and feet and this is usually much worse if she is putting her hands or feet in warm water and when bathing.  She also has some symptoms otherwise including some at night She was given gabapentin by neurologist for other reasons but was told not to take it because of liver dysfunction      Lipids: These are followed by PCP and tends to have high triglycerides and mildly increased LDL      Lab Results  Component Value Date   CHOL 208* 01/12/2014   HDL 62.30 01/12/2014   LDLDIRECT 109.3 01/12/2014   TRIG 258.0* 01/12/2014   CHOLHDL 3 01/12/2014    She is currently taking amlodipine for hypertension  She is being followed at [facility] for her various problems including transplant rejection She thinks her liver functions have been much better recently      Thyroid:  No  unusual fatigue. She had a thyroid biopsy in 6/14 for a left-sided thyroid nodule which was benign  Physical Examination:  BP 118/74 mmHg  Pulse 128  Temp(Src) 98.1 F (36.7 C) (Oral)  Ht 5\' 7"  (1.702 m)  Wt 242 lb (109.77 kg)  BMI 37.89 kg/m2  SpO2 98%  LMP 08/09/2011      no  edema present    ASSESSMENT:  Diabetes type 2,  uncontrolled  Her blood sugars are much higher after meals mostly because of her not following instructions for her mealtime insulin He will lower his prednisone dose has been reduced recently her blood sugars are still tending to be well over 200 after meals especially in the evenings As discussed in history of present illness her compliance is somewhat erratic and not clear if she is following her diet consistently also She has variable meal times partly due to intercurrent problems with her liver and variable appetite  Although previously her fasting readings were controlled with low-dose Lantus she has not checked her fasting readings recently Also currently not taking insulin sensitizer, previously on Janumet  PLAN:   Continue taking Lantus 12 units and check some readings in the mornings also, discussed blood sugar targets in the mornings for adjusting the dose up or down 2 units  Stressed the importance of her taking NovoLog 10-15 minutes before eating regardless of the blood sugar  If her blood sugars after meals are still over 180 she can increase the dose beyond 18 units  Also may take larger doses if she is eating  more carbohydrates or a larger meal  Balanced meals with some protein at each meal  Consider restarting Janumet if liver functions are consistently normal  Continue regular walking  May take gabapentin as needed since her liver functions reportedly are better    Patient Instructions  Please check blood sugars at least half the time about 2 hours after any meal and 3 times per week on waking up.  Please bring blood sugar monitor to each visit.  Recommended blood sugar levels about 2 hours after meal is 140-180 and on waking up 90-130  Novolog 14-16 before breakfast and 18 BEFORE eating supper to keep sugar from rising with food, may need less later this week  May reduce Lantus if waking up with sugars <90 on waking    Counseling time over 50% of today's 25  minute visit  , 05/14/2014, 7:54 AM   Note: This office note was prepared with Estate agent. Any transcriptional errors that result from this process are unintentional.

## 2014-05-13 NOTE — Patient Instructions (Signed)
Please check blood sugars at least half the time about 2 hours after any meal and 3 times per week on waking up.  Please bring blood sugar monitor to each visit.  Recommended blood sugar levels about 2 hours after meal is 140-180 and on waking up 90-130  Novolog 14-16 before breakfast and 18 BEFORE eating supper to keep sugar from rising with food, may need less later this week  May reduce Lantus if waking up with sugars <90 on waking

## 2014-05-13 NOTE — Progress Notes (Signed)
Pre visit review using our clinic review tool, if applicable. No additional management support is needed unless otherwise documented below in the visit note. 

## 2014-05-19 ENCOUNTER — Telehealth: Payer: Self-pay | Admitting: Hematology and Oncology

## 2014-05-19 ENCOUNTER — Other Ambulatory Visit: Payer: Self-pay | Admitting: *Deleted

## 2014-05-19 ENCOUNTER — Other Ambulatory Visit: Payer: Self-pay | Admitting: Hematology and Oncology

## 2014-05-19 ENCOUNTER — Telehealth: Payer: Self-pay | Admitting: *Deleted

## 2014-05-19 DIAGNOSIS — C9101 Acute lymphoblastic leukemia, in remission: Secondary | ICD-10-CM

## 2014-05-19 NOTE — Telephone Encounter (Signed)
Spoke with patient re appointment tomorrow @ 11am. Per 4/19 pof will try to work patient in for IVF's tomorrow.

## 2014-05-19 NOTE — Telephone Encounter (Signed)
I can add her on tomorrow morning to see me at 1145 am. She needs labs prior and we can try to work her in for IVF If this is acceptable, please arrange for her to come in I will place order for labs

## 2014-05-19 NOTE — Telephone Encounter (Signed)
Error in opening

## 2014-05-19 NOTE — Telephone Encounter (Signed)
Will notify patient of appts for tomorrow, 05/20/14

## 2014-05-19 NOTE — Telephone Encounter (Signed)
Pt. Receives prednisone and prograf for immunosuppressive therapy. Please advise re: questionable dehydration

## 2014-05-19 NOTE — Telephone Encounter (Signed)
TC from patient. She states she 'thinks' she needs some IV fluids. Asked pt to explain this-she states her mouth feels dry, her blood sugar levels have been elevated in the 200-300 range (after meals). Feels only a little bit dizzy upon standing and not very often. Was treated last month for oral thrush. States her throat is a little bit sore when she tried to swallow bread. Pt. Not getting active treatment (other than immunotherapy s/p at this time and has had visits to Internal Medicine MD for uncontrolled Diabetes.

## 2014-05-20 ENCOUNTER — Other Ambulatory Visit (HOSPITAL_BASED_OUTPATIENT_CLINIC_OR_DEPARTMENT_OTHER): Payer: Medicare Other

## 2014-05-20 ENCOUNTER — Ambulatory Visit (HOSPITAL_COMMUNITY)
Admission: RE | Admit: 2014-05-20 | Discharge: 2014-05-20 | Disposition: A | Discharge status: Home / Self Care | Payer: Medicare Other | Source: Ambulatory Visit | Attending: Hematology and Oncology | Admitting: Hematology and Oncology

## 2014-05-20 ENCOUNTER — Encounter: Payer: Self-pay | Admitting: Hematology and Oncology

## 2014-05-20 ENCOUNTER — Telehealth: Payer: Self-pay | Admitting: Hematology and Oncology

## 2014-05-20 ENCOUNTER — Ambulatory Visit (HOSPITAL_BASED_OUTPATIENT_CLINIC_OR_DEPARTMENT_OTHER): Payer: Medicare Other | Admitting: Hematology and Oncology

## 2014-05-20 VITALS — BP 119/80 | HR 94 | Temp 98.2°F | Resp 18 | Ht 67.0 in | Wt 248.5 lb

## 2014-05-20 DIAGNOSIS — B3781 Candidal esophagitis: Secondary | ICD-10-CM | POA: Diagnosis not present

## 2014-05-20 DIAGNOSIS — B370 Candidal stomatitis: Secondary | ICD-10-CM | POA: Diagnosis not present

## 2014-05-20 DIAGNOSIS — C9101 Acute lymphoblastic leukemia, in remission: Secondary | ICD-10-CM

## 2014-05-20 DIAGNOSIS — D89811 Chronic graft-versus-host disease: Secondary | ICD-10-CM | POA: Diagnosis not present

## 2014-05-20 DIAGNOSIS — E860 Dehydration: Secondary | ICD-10-CM

## 2014-05-20 DIAGNOSIS — N19 Unspecified kidney failure: Secondary | ICD-10-CM | POA: Diagnosis not present

## 2014-05-20 LAB — COMPREHENSIVE METABOLIC PANEL (CC13)
ALBUMIN: 3.4 g/dL — AB (ref 3.5–5.0)
ALT: 36 U/L (ref 0–55)
ANION GAP: 12 meq/L — AB (ref 3–11)
AST: 33 U/L (ref 5–34)
Alkaline Phosphatase: 142 U/L (ref 40–150)
BUN: 17.1 mg/dL (ref 7.0–26.0)
CO2: 23 mEq/L (ref 22–29)
Calcium: 9.1 mg/dL (ref 8.4–10.4)
Chloride: 107 mEq/L (ref 98–109)
Creatinine: 1.3 mg/dL — ABNORMAL HIGH (ref 0.6–1.1)
EGFR: 58 mL/min/{1.73_m2} — AB (ref >90)
Glucose: 135 mg/dl (ref 70–140)
POTASSIUM: 4.5 meq/L (ref 3.5–5.1)
Sodium: 143 mEq/L (ref 136–145)
Total Bilirubin: 0.41 mg/dL (ref 0.20–1.20)
Total Protein: 6.2 g/dL — ABNORMAL LOW (ref 6.4–8.3)

## 2014-05-20 LAB — CBC WITH DIFFERENTIAL/PLATELET
BASO%: 0.4 % (ref 0.0–2.0)
BASOS ABS: 0 10*3/uL (ref 0.0–0.1)
EOS%: 3.4 % (ref 0.0–7.0)
Eosinophils Absolute: 0.2 10*3/uL (ref 0.0–0.5)
HCT: 36.5 % (ref 34.8–46.6)
HEMOGLOBIN: 12.4 g/dL (ref 11.6–15.9)
LYMPH#: 2.8 10*3/uL (ref 0.9–3.3)
LYMPH%: 53.3 % — ABNORMAL HIGH (ref 14.0–49.7)
MCH: 33.1 pg (ref 25.1–34.0)
MCHC: 34 g/dL (ref 31.5–36.0)
MCV: 97.3 fL (ref 79.5–101.0)
MONO#: 0.7 10*3/uL (ref 0.1–0.9)
MONO%: 12.6 % (ref 0.0–14.0)
NEUT#: 1.6 10*3/uL (ref 1.5–6.5)
NEUT%: 30.3 % — ABNORMAL LOW (ref 38.4–76.8)
Platelets: 165 10*3/uL (ref 145–400)
RBC: 3.75 10*6/uL (ref 3.70–5.45)
RDW: 15.9 % — ABNORMAL HIGH (ref 11.2–14.5)
WBC: 5.2 10*3/uL (ref 3.9–10.3)

## 2014-05-20 LAB — LACTATE DEHYDROGENASE (CC13): LDH: 264 U/L — ABNORMAL HIGH (ref 125–245)

## 2014-05-20 MED ORDER — HEPARIN SOD (PORK) LOCK FLUSH 100 UNIT/ML IV SOLN
500.0000 [IU] | Freq: Once | INTRAVENOUS | Status: AC
  Administered 2014-05-20: 500 [IU] via INTRAVENOUS
  Filled 2014-05-20: qty 5

## 2014-05-20 MED ORDER — SODIUM CHLORIDE 0.9 % IJ SOLN
10.0000 mL | Freq: Once | INTRAMUSCULAR | Status: AC
  Administered 2014-05-20: 10 mL via INTRAVENOUS

## 2014-05-20 MED ORDER — SODIUM CHLORIDE 0.9 % IV SOLN
INTRAVENOUS | Status: DC
  Administered 2014-05-20: 1000 mL via INTRAVENOUS

## 2014-05-20 MED ORDER — PROMETHAZINE HCL 25 MG/ML IJ SOLN
25.0000 mg | Freq: Once | INTRAMUSCULAR | Status: AC
  Administered 2014-05-20: 25 mg via INTRAVENOUS
  Filled 2014-05-20: qty 1

## 2014-05-20 NOTE — Progress Notes (Signed)
Dr. Heath Lark Diagnosis: 1.C91.01   2. D89.81  Accessed Patient's Right implanted port, gave infusion of 1000 ml NS @ 500 ml/hr per MD order. Port flushed, discharged and ambulated out of center by self.

## 2014-05-20 NOTE — Telephone Encounter (Signed)
gave and printed appt sched adn avs for pt for April and May

## 2014-05-21 ENCOUNTER — Ambulatory Visit (HOSPITAL_BASED_OUTPATIENT_CLINIC_OR_DEPARTMENT_OTHER): Payer: Medicare Other

## 2014-05-21 VITALS — BP 132/85 | HR 73 | Temp 97.1°F

## 2014-05-21 DIAGNOSIS — E860 Dehydration: Secondary | ICD-10-CM | POA: Diagnosis not present

## 2014-05-21 DIAGNOSIS — C9101 Acute lymphoblastic leukemia, in remission: Secondary | ICD-10-CM | POA: Diagnosis not present

## 2014-05-21 DIAGNOSIS — D89811 Chronic graft-versus-host disease: Secondary | ICD-10-CM

## 2014-05-21 DIAGNOSIS — N19 Unspecified kidney failure: Secondary | ICD-10-CM | POA: Insufficient documentation

## 2014-05-21 MED ORDER — PROMETHAZINE HCL 25 MG/ML IJ SOLN
25.0000 mg | Freq: Once | INTRAMUSCULAR | Status: AC
  Administered 2014-05-21: 25 mg via INTRAVENOUS
  Filled 2014-05-21: qty 1

## 2014-05-21 MED ORDER — SODIUM CHLORIDE 0.9 % IV SOLN
Freq: Once | INTRAVENOUS | Status: AC
  Administered 2014-05-21: 13:00:00 via INTRAVENOUS

## 2014-05-21 NOTE — Progress Notes (Signed)
[place] OFFICE PROGRESS NOTE  Patient Care Team: L.Donnie Coffin, MD as PCP - General (Family Medicine) Garry Heater, DO as Referring Physician (Hematology)  SUMMARY OF ONCOLOGIC HISTORY:  CHIEF COMPLAINTS/PURPOSE OF VISIT:  Lynn [name], status post allogenic stem cell transplant, severe dehydration  HISTORY OF PRESENTING ILLNESS:  Lynn Lynn [name] 49 y.o. female was transferred to my care after her prior physician has left.  I reviewed the patient's records extensive and collaborated the history with the patient. Summary of her history is as follows: The patient was diagnosed with precursor B-cell Lynn [name], Philadelphia chromosome positive. She initially presented in 2013 with leg swelling and was found to have pulmonary emboli. In the course of workup, she was found to have Lynn [name]. Her anticoagulation therapy was discontinued when she developed complication during chemotherapy with subdural hematoma requiring placement of drainage. She received autologous stem cell transplant in October 2013 and had relapse. Subsequently, on 02/06/2013, she has unrelated allogeneic stem cell transplant.  Her post transplant course was complicated by C. Difficile positive diarrhea treated with oral vancomycin, pneumonia likely bacterial.  CXR with new patchy opacities treated with zosyn and moxifloxacin and acute kidney injury secondary to dehydration from diarrhea and ATN which improved with hydration.   Recently she has recurrence of pneumonia. CT angiogram on 12/02/2013 showed no evidence of PE. Dose of her prednisone was increased and currently she is on azithromycin for chronic suppressive therapy. 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 She was noted to have elevated liver enzymes and liver biopsy last week, suspicious for graft-versus-host disease involving the liver she was placed on high-dose prednisone therapy  INTERVAL HISTORY: Please see below for  problem oriented charting. She requests urgent visit due to dehydration. She has significant hyperglycemia from recent increased dose prednisone. She said developed mucositis and thrush. She denies diarrhea. She is not able to maintain adequate oral intake and felt dizzy  REVIEW OF SYSTEMS:   Constitutional: Denies fevers, chills or abnormal weight loss Eyes: Denies blurriness of vision Respiratory: Denies cough, dyspnea or wheezes Cardiovascular: Denies palpitation, chest discomfort or lower extremity swelling Skin: Denies abnormal skin rashes Lymphatics: Denies new lymphadenopathy or easy bruising Neurological:Denies numbness, tingling or new weaknesses Behavioral/Psych: Mood is stable, no new changes  Lynn [name] other systems were reviewed with the patient and are negative.  I have reviewed the past medical history, past surgical history, social history and family history with the patient and they are unchanged from previous note.  ALLERGIES:  is allergic to other.  MEDICATIONS:  Current Outpatient Prescriptions  Medication Sig Dispense Refill  . acyclovir (ZOVIRAX) 800 MG tablet Take 1 tablet (800 mg total) by mouth 2 (two) times daily. . 60 tablet 5  . amitriptyline (ELAVIL) 10 MG tablet Take 1 tablet (10 mg total) by mouth at bedtime. 90 tablet 3  . amLODipine (NORVASC) 5 MG tablet Take 5 mg by mouth daily.    . cetirizine (ZYRTEC) 10 MG tablet Take 1 tablet (10 mg total) by mouth daily. (Patient taking differently: Take 10 mg by mouth daily as needed. ) 30 tablet 0  . fluconazole (DIFLUCAN) 100 MG tablet Take 1 tablet (100 mg total) by mouth daily. 7 tablet 0  . insulin aspart (NOVOLOG) 100 UNIT/ML FlexPen Inject 16 Units into the skin 3 (three) times daily with meals. (If you have eaten more than 50% of your meal.)    . Insulin Glargine (LANTUS) 100 UNIT/ML Solostar Pen Inject 12 Units  into the skin every morning. Sliding scale.    Marland Kitchen LORazepam (ATIVAN) 1 MG tablet Take 1 mg by mouth  as needed.     . magnesium chloride (SLOW-MAG) 64 MG TBEC SR tablet Take 2 tablets by mouth 3 (three) times daily.     . Multiple Vitamin (MULTIVITAMIN) tablet Take 1 tablet by mouth daily.    . ondansetron (ZOFRAN ODT) 4 MG disintegrating tablet Take 1 tablet (4 mg total) by mouth every 8 (eight) hours as needed for nausea. 30 tablet 1  . predniSONE (DELTASONE) 10 MG tablet Take 10 mg by mouth daily.     Marland Kitchen sulfamethoxazole-trimethoprim (BACTRIM DS) 800-160 MG per tablet Take 1 tablet by mouth every Monday, Wednesday, and Friday. Takes 1 tablet Monday-Wednesday-Friday 30 tablet 3  . tacrolimus (PROGRAF) 0.5 MG capsule Take 1.5 mg by mouth 2 (two) times daily.    . traMADol (ULTRAM) 50 MG tablet Take 1 tablet (50 mg total) by mouth every 6 (six) hours as needed for moderate pain. 30 tablet 0   No current facility-administered medications for this visit.    PHYSICAL EXAMINATION: ECOG PERFORMANCE STATUS: 1 - Symptomatic but completely ambulatory  Filed Vitals:   05/20/14 1113  BP: 119/80  Pulse: 94  Temp: 98.2 F (36.8 C)  Resp: 18   Filed Weights   05/20/14 1113  Weight: 248 lb 8 oz (112.719 kg)    GENERAL:alert, no distress and comfortable. She is morbidly obese SKIN: skin color, texture, turgor are normal, no rashes or significant lesions EYES: normal, Conjunctiva are pink and non-injected, sclera clear OROPHARYNX: She has dry mucous membrane. No active thrush NECK: supple, thyroid normal size, non-tender, without nodularity LYMPH:  no palpable lymphadenopathy in the cervical, axillary or inguinal LUNGS: clear to auscultation and percussion with normal breathing effort HEART: regular rate & rhythm and no murmurs and no lower extremity edema ABDOMEN:abdomen soft, non-tender and normal bowel sounds Musculoskeletal:no cyanosis of digits and no clubbing  NEURO: alert & oriented x 3 with fluent speech, no focal motor/sensory deficits  LABORATORY DATA:  I have reviewed the data as  listed    Component Value Date/Time   NA 143 05/20/2014 1102   NA 136 05/08/2014 1455   K 4.5 05/20/2014 1102   K 3.8 05/08/2014 1455   CL 102 05/08/2014 1455   CL 106 07/09/2012 0949   CO2 23 05/20/2014 1102   CO2 28 05/08/2014 1455   GLUCOSE 135 05/20/2014 1102   GLUCOSE 177* 05/08/2014 1455   GLUCOSE 109* 07/09/2012 0949   BUN 17.1 05/20/2014 1102   BUN 18 05/08/2014 1455   CREATININE 1.3* 05/20/2014 1102   CREATININE 1.10 05/08/2014 1455   CALCIUM 9.1 05/20/2014 1102   CALCIUM 9.7 05/08/2014 1455   PROT 6.2* 05/20/2014 1102   PROT 6.3 01/12/2014 1144   ALBUMIN 3.4* 05/20/2014 1102   ALBUMIN 3.8 01/12/2014 1144   AST 33 05/20/2014 1102   AST 90* 01/12/2014 1144   ALT 36 05/20/2014 1102   ALT 96* 01/12/2014 1144   ALKPHOS 142 05/20/2014 1102   ALKPHOS 128* 01/12/2014 1144   BILITOT 0.41 05/20/2014 1102   BILITOT 0.3 01/12/2014 1144   GFRNONAA 60* 12/28/2013 0815   GFRAA 69* 12/28/2013 0815    No results found for: SPEP, UPEP  Lab Results  Component Value Date   WBC 5.2 05/20/2014   NEUTROABS 1.6 05/20/2014   HGB 12.4 05/20/2014   HCT 36.5 05/20/2014   MCV 97.3 05/20/2014   PLT 165  05/20/2014      Chemistry      Component Value Date/Time   NA 143 05/20/2014 1102   NA 136 05/08/2014 1455   K 4.5 05/20/2014 1102   K 3.8 05/08/2014 1455   CL 102 05/08/2014 1455   CL 106 07/09/2012 0949   CO2 23 05/20/2014 1102   CO2 28 05/08/2014 1455   BUN 17.1 05/20/2014 1102   BUN 18 05/08/2014 1455   CREATINE 1.3* 05/20/2014 1102   CREATINE 1.10 05/08/2014 1455      Component Value Date/Time   CALCIUM 9.1 05/20/2014 1102   CALCIUM 9.7 05/08/2014 1455   ALKPHOS 142 05/20/2014 1102   ALKPHOS 128* 01/12/2014 1144   AST 33 05/20/2014 1102   AST 90* 01/12/2014 1144   ALT 36 05/20/2014 1102   ALT 96* 01/12/2014 1144   BILITOT 0.41 05/20/2014 1102   BILITOT 0.3 01/12/2014 1144       ASSESSMENT & PLAN:  Acute lymphoblastic leukemia in  remission Clinically, she has no signs of recurrence of disease. She has appointment to see her transplant physician soon. In the meantime, I plan to see her periodically for supportive care   Dehydration This was related to recent hypoglycemia from prednisone and thrush. I recommend IV fluid support.   Prerenal renal failure She has significant dehydration and prerenal failure. I recommend IV fluid support as above.   Thrush of mouth and esophagus This was related to recent prednisone therapy. Clinically, she is improving on fluconazole. Continue the same.   Chronic graft-versus-host disease Apparently, she have chronic graft-versus-host disease affecting her skin, her lung and her liver. She is on immunosuppressive therapy for this. I will defer to her transplant physician for management.      No orders of the defined types were placed in this encounter.   Lynn [name] questions were answered. The patient knows to call the clinic with any problems, questions or concerns. No barriers to learning was detected. I spent 25 minutes counseling the patient face to face. The total time spent in the appointment was 30 minutes and more than 50% was on counseling and review of test results     [facility], , MD 05/21/2014 10:22 AM

## 2014-05-21 NOTE — Assessment & Plan Note (Signed)
This was related to recent hypoglycemia from prednisone and thrush. I recommend IV fluid support.

## 2014-05-21 NOTE — Assessment & Plan Note (Signed)
Apparently, she have chronic graft-versus-host disease affecting her skin, her lung and her liver. She is on immunosuppressive therapy for this. I will defer to her transplant physician for management.

## 2014-05-21 NOTE — Assessment & Plan Note (Signed)
This was related to recent prednisone therapy. Clinically, she is improving on fluconazole. Continue the same.

## 2014-05-21 NOTE — Patient Instructions (Signed)
Dehydration, Adult Dehydration is when you lose more fluids from the body than you take in. Vital organs like the kidneys, brain, and heart cannot function without a proper amount of fluids and salt. Any loss of fluids from the body can cause dehydration.  CAUSES   Vomiting.  Diarrhea.  Excessive sweating.  Excessive urine output.  Fever. SYMPTOMS  Mild dehydration  Thirst.  Dry lips.  Slightly dry mouth. Moderate dehydration  Very dry mouth.  Sunken eyes.  Skin does not bounce back quickly when lightly pinched and released.  Dark urine and decreased urine production.  Decreased tear production.  Headache. Severe dehydration  Very dry mouth.  Extreme thirst.  Rapid, weak pulse (more than 100 beats per minute at rest).  Cold hands and feet.  Not able to sweat in spite of heat and temperature.  Rapid breathing.  Blue lips.  Confusion and lethargy.  Difficulty being awakened.  Minimal urine production.  No tears. DIAGNOSIS  Your caregiver will diagnose dehydration based on your symptoms and your exam. Blood and urine tests will help confirm the diagnosis. The diagnostic evaluation should also identify the cause of dehydration. TREATMENT  Treatment of mild or moderate dehydration can often be done at home by increasing the amount of fluids that you drink. It is best to drink small amounts of fluid more often. Drinking too much at one time can make vomiting worse. Refer to the home care instructions below. Severe dehydration needs to be treated at the hospital where you will probably be given intravenous (IV) fluids that contain water and electrolytes. HOME CARE INSTRUCTIONS   Ask your caregiver about specific rehydration instructions.  Drink enough fluids to keep your urine clear or pale yellow.  Drink small amounts frequently if you have nausea and vomiting.  Eat as you normally do.  Avoid:  Foods or drinks high in sugar.  Carbonated  drinks.  Juice.  Extremely hot or cold fluids.  Drinks with caffeine.  Fatty, greasy foods.  Alcohol.  Tobacco.  Overeating.  Gelatin desserts.  Wash your hands well to avoid spreading bacteria and viruses.  Only take over-the-counter or prescription medicines for pain, discomfort, or fever as directed by your caregiver.  Ask your caregiver if you should continue all prescribed and over-the-counter medicines.  Keep all follow-up appointments with your caregiver. SEEK MEDICAL CARE IF:  You have abdominal pain and it increases or stays in one area (localizes).  You have a rash, stiff neck, or severe headache.  You are irritable, sleepy, or difficult to awaken.  You are weak, dizzy, or extremely thirsty. SEEK IMMEDIATE MEDICAL CARE IF:   You are unable to keep fluids down or you get worse despite treatment.  You have frequent episodes of vomiting or diarrhea.  You have blood or green matter (bile) in your vomit.  You have blood in your stool or your stool looks black and tarry.  You have not urinated in 6 to 8 hours, or you have only urinated a small amount of very dark urine.  You have a fever.  You faint. MAKE SURE YOU:   Understand these instructions.  Will watch your condition.  Will get help right away if you are not doing well or get worse. Document Released: 01/16/2005 Document Revised: 04/10/2011 Document Reviewed: 09/05/2010 [employer] Patient Information 2015 [employer]. This information is not intended to replace advice given to you by your health care provider. Make sure you discuss any questions you have with your health care   provider.  

## 2014-05-21 NOTE — Assessment & Plan Note (Signed)
She has significant dehydration and prerenal failure. I recommend IV fluid support as above.

## 2014-05-21 NOTE — Assessment & Plan Note (Signed)
Clinically, she has no signs of recurrence of disease. She has appointment to see her transplant physician soon. In the meantime, I plan to see her periodically for supportive care

## 2014-05-22 ENCOUNTER — Ambulatory Visit (HOSPITAL_BASED_OUTPATIENT_CLINIC_OR_DEPARTMENT_OTHER): Payer: Medicare Other

## 2014-05-22 VITALS — BP 132/86 | HR 91 | Temp 98.3°F

## 2014-05-22 DIAGNOSIS — D89811 Chronic graft-versus-host disease: Secondary | ICD-10-CM

## 2014-05-22 DIAGNOSIS — C9101 Acute lymphoblastic leukemia, in remission: Secondary | ICD-10-CM | POA: Diagnosis not present

## 2014-05-22 DIAGNOSIS — E860 Dehydration: Secondary | ICD-10-CM | POA: Diagnosis not present

## 2014-05-22 MED ORDER — HEPARIN SOD (PORK) LOCK FLUSH 100 UNIT/ML IV SOLN
500.0000 [IU] | Freq: Once | INTRAVENOUS | Status: AC | PRN
  Administered 2014-05-22: 500 [IU]
  Filled 2014-05-22: qty 5

## 2014-05-22 MED ORDER — SODIUM CHLORIDE 0.9 % IJ SOLN
10.0000 mL | INTRAMUSCULAR | Status: DC | PRN
  Administered 2014-05-22: 10 mL
  Filled 2014-05-22: qty 10

## 2014-05-22 MED ORDER — SODIUM CHLORIDE 0.9 % IV SOLN
Freq: Once | INTRAVENOUS | Status: AC
  Administered 2014-05-22: 16:00:00 via INTRAVENOUS

## 2014-05-22 NOTE — Patient Instructions (Signed)
Dehydration, Adult Dehydration is when you lose more fluids from the body than you take in. Vital organs like the kidneys, brain, and heart cannot function without a proper amount of fluids and salt. Any loss of fluids from the body can cause dehydration.  CAUSES   Vomiting.  Diarrhea.  Excessive sweating.  Excessive urine output.  Fever. SYMPTOMS  Mild dehydration  Thirst.  Dry lips.  Slightly dry mouth. Moderate dehydration  Very dry mouth.  Sunken eyes.  Skin does not bounce back quickly when lightly pinched and released.  Dark urine and decreased urine production.  Decreased tear production.  Headache. Severe dehydration  Very dry mouth.  Extreme thirst.  Rapid, weak pulse (more than 100 beats per minute at rest).  Cold hands and feet.  Not able to sweat in spite of heat and temperature.  Rapid breathing.  Blue lips.  Confusion and lethargy.  Difficulty being awakened.  Minimal urine production.  No tears. DIAGNOSIS  Your caregiver will diagnose dehydration based on your symptoms and your exam. Blood and urine tests will help confirm the diagnosis. The diagnostic evaluation should also identify the cause of dehydration. TREATMENT  Treatment of mild or moderate dehydration can often be done at home by increasing the amount of fluids that you drink. It is best to drink small amounts of fluid more often. Drinking too much at one time can make vomiting worse. Refer to the home care instructions below. Severe dehydration needs to be treated at the hospital where you will probably be given intravenous (IV) fluids that contain water and electrolytes. HOME CARE INSTRUCTIONS   Ask your caregiver about specific rehydration instructions.  Drink enough fluids to keep your urine clear or pale yellow.  Drink small amounts frequently if you have nausea and vomiting.  Eat as you normally do.  Avoid:  Foods or drinks high in sugar.  Carbonated  drinks.  Juice.  Extremely hot or cold fluids.  Drinks with caffeine.  Fatty, greasy foods.  Alcohol.  Tobacco.  Overeating.  Gelatin desserts.  Wash your hands well to avoid spreading bacteria and viruses.  Only take over-the-counter or prescription medicines for pain, discomfort, or fever as directed by your caregiver.  Ask your caregiver if you should continue all prescribed and over-the-counter medicines.  Keep all follow-up appointments with your caregiver. SEEK MEDICAL CARE IF:  You have abdominal pain and it increases or stays in one area (localizes).  You have a rash, stiff neck, or severe headache.  You are irritable, sleepy, or difficult to awaken.  You are weak, dizzy, or extremely thirsty. SEEK IMMEDIATE MEDICAL CARE IF:   You are unable to keep fluids down or you get worse despite treatment.  You have frequent episodes of vomiting or diarrhea.  You have blood or green matter (bile) in your vomit.  You have blood in your stool or your stool looks black and tarry.  You have not urinated in 6 to 8 hours, or you have only urinated a small amount of very dark urine.  You have a fever.  You faint. MAKE SURE YOU:   Understand these instructions.  Will watch your condition.  Will get help right away if you are not doing well or get worse. Document Released: 01/16/2005 Document Revised: 04/10/2011 Document Reviewed: 09/05/2010 [employer] Patient Information 2015 [employer]. This information is not intended to replace advice given to you by your health care provider. Make sure you discuss any questions you have with your health care   provider.  

## 2014-05-25 ENCOUNTER — Ambulatory Visit (HOSPITAL_BASED_OUTPATIENT_CLINIC_OR_DEPARTMENT_OTHER): Payer: Medicare Other

## 2014-05-25 VITALS — BP 129/58 | HR 100 | Temp 98.8°F | Resp 18

## 2014-05-25 DIAGNOSIS — C9101 Acute lymphoblastic leukemia, in remission: Secondary | ICD-10-CM | POA: Diagnosis not present

## 2014-05-25 DIAGNOSIS — D89811 Chronic graft-versus-host disease: Secondary | ICD-10-CM

## 2014-05-25 DIAGNOSIS — E860 Dehydration: Secondary | ICD-10-CM

## 2014-05-25 MED ORDER — SODIUM CHLORIDE 0.9 % IV SOLN
Freq: Once | INTRAVENOUS | Status: AC
  Administered 2014-05-25: 14:00:00 via INTRAVENOUS

## 2014-05-25 MED ORDER — HEPARIN SOD (PORK) LOCK FLUSH 100 UNIT/ML IV SOLN
500.0000 [IU] | Freq: Once | INTRAVENOUS | Status: AC | PRN
  Administered 2014-05-25: 500 [IU]
  Filled 2014-05-25: qty 5

## 2014-05-25 MED ORDER — SODIUM CHLORIDE 0.9 % IJ SOLN
10.0000 mL | INTRAMUSCULAR | Status: DC | PRN
  Administered 2014-05-25: 10 mL
  Filled 2014-05-25: qty 10

## 2014-05-25 MED ORDER — PROMETHAZINE HCL 25 MG/ML IJ SOLN
25.0000 mg | Freq: Once | INTRAMUSCULAR | Status: AC
  Administered 2014-05-25: 25 mg via INTRAVENOUS
  Filled 2014-05-25: qty 1

## 2014-05-25 NOTE — Patient Instructions (Signed)
Dehydration, Adult Dehydration is when you lose more fluids from the body than you take in. Vital organs like the kidneys, brain, and heart cannot function without a proper amount of fluids and salt. Any loss of fluids from the body can cause dehydration.  CAUSES   Vomiting.  Diarrhea.  Excessive sweating.  Excessive urine output.  Fever. SYMPTOMS  Mild dehydration  Thirst.  Dry lips.  Slightly dry mouth. Moderate dehydration  Very dry mouth.  Sunken eyes.  Skin does not bounce back quickly when lightly pinched and released.  Dark urine and decreased urine production.  Decreased tear production.  Headache. Severe dehydration  Very dry mouth.  Extreme thirst.  Rapid, weak pulse (more than 100 beats per minute at rest).  Cold hands and feet.  Not able to sweat in spite of heat and temperature.  Rapid breathing.  Blue lips.  Confusion and lethargy.  Difficulty being awakened.  Minimal urine production.  No tears. DIAGNOSIS  Your caregiver will diagnose dehydration based on your symptoms and your exam. Blood and urine tests will help confirm the diagnosis. The diagnostic evaluation should also identify the cause of dehydration. TREATMENT  Treatment of mild or moderate dehydration can often be done at home by increasing the amount of fluids that you drink. It is best to drink small amounts of fluid more often. Drinking too much at one time can make vomiting worse. Refer to the home care instructions below. Severe dehydration needs to be treated at the hospital where you will probably be given intravenous (IV) fluids that contain water and electrolytes. HOME CARE INSTRUCTIONS   Ask your caregiver about specific rehydration instructions.  Drink enough fluids to keep your urine clear or pale yellow.  Drink small amounts frequently if you have nausea and vomiting.  Eat as you normally do.  Avoid:  Foods or drinks high in sugar.  Carbonated  drinks.  Juice.  Extremely hot or cold fluids.  Drinks with caffeine.  Fatty, greasy foods.  Alcohol.  Tobacco.  Overeating.  Gelatin desserts.  Wash your hands well to avoid spreading bacteria and viruses.  Only take over-the-counter or prescription medicines for pain, discomfort, or fever as directed by your caregiver.  Ask your caregiver if you should continue all prescribed and over-the-counter medicines.  Keep all follow-up appointments with your caregiver. SEEK MEDICAL CARE IF:  You have abdominal pain and it increases or stays in one area (localizes).  You have a rash, stiff neck, or severe headache.  You are irritable, sleepy, or difficult to awaken.  You are weak, dizzy, or extremely thirsty. SEEK IMMEDIATE MEDICAL CARE IF:   You are unable to keep fluids down or you get worse despite treatment.  You have frequent episodes of vomiting or diarrhea.  You have blood or green matter (bile) in your vomit.  You have blood in your stool or your stool looks black and tarry.  You have not urinated in 6 to 8 hours, or you have only urinated a small amount of very dark urine.  You have a fever.  You faint. MAKE SURE YOU:   Understand these instructions.  Will watch your condition.  Will get help right away if you are not doing well or get worse. Document Released: 01/16/2005 Document Revised: 04/10/2011 Document Reviewed: 09/05/2010 [employer] Patient Information 2015 [employer]. This information is not intended to replace advice given to you by your health care provider. Make sure you discuss any questions you have with your health care   provider.  

## 2014-05-27 ENCOUNTER — Ambulatory Visit (HOSPITAL_BASED_OUTPATIENT_CLINIC_OR_DEPARTMENT_OTHER): Payer: Medicare Other

## 2014-05-27 VITALS — BP 119/74 | HR 87 | Temp 98.3°F | Resp 16

## 2014-05-27 DIAGNOSIS — D89811 Chronic graft-versus-host disease: Secondary | ICD-10-CM

## 2014-05-27 DIAGNOSIS — C9101 Acute lymphoblastic leukemia, in remission: Secondary | ICD-10-CM | POA: Diagnosis not present

## 2014-05-27 DIAGNOSIS — E860 Dehydration: Secondary | ICD-10-CM

## 2014-05-27 MED ORDER — PROMETHAZINE HCL 25 MG/ML IJ SOLN
25.0000 mg | Freq: Once | INTRAMUSCULAR | Status: AC
  Administered 2014-05-27: 25 mg via INTRAVENOUS
  Filled 2014-05-27: qty 1

## 2014-05-27 MED ORDER — HEPARIN SOD (PORK) LOCK FLUSH 100 UNIT/ML IV SOLN
500.0000 [IU] | Freq: Once | INTRAVENOUS | Status: AC | PRN
  Administered 2014-05-27: 500 [IU]
  Filled 2014-05-27: qty 5

## 2014-05-27 MED ORDER — SODIUM CHLORIDE 0.9 % IV SOLN
1000.0000 mL | Freq: Once | INTRAVENOUS | Status: AC
  Administered 2014-05-27: 1000 mL via INTRAVENOUS

## 2014-05-27 MED ORDER — SODIUM CHLORIDE 0.9 % IJ SOLN
10.0000 mL | INTRAMUSCULAR | Status: DC | PRN
  Administered 2014-05-27: 10 mL
  Filled 2014-05-27: qty 10

## 2014-05-27 NOTE — Patient Instructions (Signed)
Dehydration, Adult Dehydration is when you lose more fluids from the body than you take in. Vital organs like the kidneys, brain, and heart cannot function without a proper amount of fluids and salt. Any loss of fluids from the body can cause dehydration.  CAUSES   Vomiting.  Diarrhea.  Excessive sweating.  Excessive urine output.  Fever. SYMPTOMS  Mild dehydration  Thirst.  Dry lips.  Slightly dry mouth. Moderate dehydration  Very dry mouth.  Sunken eyes.  Skin does not bounce back quickly when lightly pinched and released.  Dark urine and decreased urine production.  Decreased tear production.  Headache. Severe dehydration  Very dry mouth.  Extreme thirst.  Rapid, weak pulse (more than 100 beats per minute at rest).  Cold hands and feet.  Not able to sweat in spite of heat and temperature.  Rapid breathing.  Blue lips.  Confusion and lethargy.  Difficulty being awakened.  Minimal urine production.  No tears. DIAGNOSIS  Your caregiver will diagnose dehydration based on your symptoms and your exam. Blood and urine tests will help confirm the diagnosis. The diagnostic evaluation should also identify the cause of dehydration. TREATMENT  Treatment of mild or moderate dehydration can often be done at home by increasing the amount of fluids that you drink. It is best to drink small amounts of fluid more often. Drinking too much at one time can make vomiting worse. Refer to the home care instructions below. Severe dehydration needs to be treated at the hospital where you will probably be given intravenous (IV) fluids that contain water and electrolytes. HOME CARE INSTRUCTIONS   Ask your caregiver about specific rehydration instructions.  Drink enough fluids to keep your urine clear or pale yellow.  Drink small amounts frequently if you have nausea and vomiting.  Eat as you normally do.  Avoid:  Foods or drinks high in sugar.  Carbonated  drinks.  Juice.  Extremely hot or cold fluids.  Drinks with caffeine.  Fatty, greasy foods.  Alcohol.  Tobacco.  Overeating.  Gelatin desserts.  Wash your hands well to avoid spreading bacteria and viruses.  Only take over-the-counter or prescription medicines for pain, discomfort, or fever as directed by your caregiver.  Ask your caregiver if you should continue all prescribed and over-the-counter medicines.  Keep all follow-up appointments with your caregiver. SEEK MEDICAL CARE IF:  You have abdominal pain and it increases or stays in one area (localizes).  You have a rash, stiff neck, or severe headache.  You are irritable, sleepy, or difficult to awaken.  You are weak, dizzy, or extremely thirsty. SEEK IMMEDIATE MEDICAL CARE IF:   You are unable to keep fluids down or you get worse despite treatment.  You have frequent episodes of vomiting or diarrhea.  You have blood or green matter (bile) in your vomit.  You have blood in your stool or your stool looks black and tarry.  You have not urinated in 6 to 8 hours, or you have only urinated a small amount of very dark urine.  You have a fever.  You faint. MAKE SURE YOU:   Understand these instructions.  Will watch your condition.  Will get help right away if you are not doing well or get worse. Document Released: 01/16/2005 Document Revised: 04/10/2011 Document Reviewed: 09/05/2010 [employer] Patient Information 2015 [employer]. This information is not intended to replace advice given to you by your health care provider. Make sure you discuss any questions you have with your health care   provider.  

## 2014-05-29 ENCOUNTER — Ambulatory Visit (HOSPITAL_BASED_OUTPATIENT_CLINIC_OR_DEPARTMENT_OTHER): Payer: Medicare Other

## 2014-05-29 VITALS — BP 118/81 | HR 105 | Temp 98.3°F | Resp 18

## 2014-05-29 DIAGNOSIS — E860 Dehydration: Secondary | ICD-10-CM | POA: Diagnosis not present

## 2014-05-29 DIAGNOSIS — C9101 Acute lymphoblastic leukemia, in remission: Secondary | ICD-10-CM | POA: Diagnosis not present

## 2014-05-29 DIAGNOSIS — D89811 Chronic graft-versus-host disease: Secondary | ICD-10-CM

## 2014-05-29 MED ORDER — SODIUM CHLORIDE 0.9 % IV SOLN
Freq: Once | INTRAVENOUS | Status: AC
  Administered 2014-05-29: 14:00:00 via INTRAVENOUS

## 2014-05-29 MED ORDER — PROMETHAZINE HCL 25 MG/ML IJ SOLN
25.0000 mg | Freq: Once | INTRAMUSCULAR | Status: AC
  Administered 2014-05-29: 25 mg via INTRAVENOUS
  Filled 2014-05-29: qty 1

## 2014-05-29 MED ORDER — HEPARIN SOD (PORK) LOCK FLUSH 100 UNIT/ML IV SOLN
500.0000 [IU] | Freq: Once | INTRAVENOUS | Status: AC | PRN
  Administered 2014-05-29: 500 [IU]
  Filled 2014-05-29: qty 5

## 2014-05-29 MED ORDER — SODIUM CHLORIDE 0.9 % IJ SOLN
10.0000 mL | INTRAMUSCULAR | Status: DC | PRN
  Administered 2014-05-29: 10 mL
  Filled 2014-05-29: qty 10

## 2014-05-29 NOTE — Patient Instructions (Signed)
Dehydration, Adult Dehydration is when you lose more fluids from the body than you take in. Vital organs like the kidneys, brain, and heart cannot function without a proper amount of fluids and salt. Any loss of fluids from the body can cause dehydration.  CAUSES   Vomiting.  Diarrhea.  Excessive sweating.  Excessive urine output.  Fever. SYMPTOMS  Mild dehydration  Thirst.  Dry lips.  Slightly dry mouth. Moderate dehydration  Very dry mouth.  Sunken eyes.  Skin does not bounce back quickly when lightly pinched and released.  Dark urine and decreased urine production.  Decreased tear production.  Headache. Severe dehydration  Very dry mouth.  Extreme thirst.  Rapid, weak pulse (more than 100 beats per minute at rest).  Cold hands and feet.  Not able to sweat in spite of heat and temperature.  Rapid breathing.  Blue lips.  Confusion and lethargy.  Difficulty being awakened.  Minimal urine production.  No tears. DIAGNOSIS  Your caregiver will diagnose dehydration based on your symptoms and your exam. Blood and urine tests will help confirm the diagnosis. The diagnostic evaluation should also identify the cause of dehydration. TREATMENT  Treatment of mild or moderate dehydration can often be done at home by increasing the amount of fluids that you drink. It is best to drink small amounts of fluid more often. Drinking too much at one time can make vomiting worse. Refer to the home care instructions below. Severe dehydration needs to be treated at the hospital where you will probably be given intravenous (IV) fluids that contain water and electrolytes. HOME CARE INSTRUCTIONS   Ask your caregiver about specific rehydration instructions.  Drink enough fluids to keep your urine clear or pale yellow.  Drink small amounts frequently if you have nausea and vomiting.  Eat as you normally do.  Avoid:  Foods or drinks high in sugar.  Carbonated  drinks.  Juice.  Extremely hot or cold fluids.  Drinks with caffeine.  Fatty, greasy foods.  Alcohol.  Tobacco.  Overeating.  Gelatin desserts.  Wash your hands well to avoid spreading bacteria and viruses.  Only take over-the-counter or prescription medicines for pain, discomfort, or fever as directed by your caregiver.  Ask your caregiver if you should continue all prescribed and over-the-counter medicines.  Keep all follow-up appointments with your caregiver. SEEK MEDICAL CARE IF:  You have abdominal pain and it increases or stays in one area (localizes).  You have a rash, stiff neck, or severe headache.  You are irritable, sleepy, or difficult to awaken.  You are weak, dizzy, or extremely thirsty. SEEK IMMEDIATE MEDICAL CARE IF:   You are unable to keep fluids down or you get worse despite treatment.  You have frequent episodes of vomiting or diarrhea.  You have blood or green matter (bile) in your vomit.  You have blood in your stool or your stool looks black and tarry.  You have not urinated in 6 to 8 hours, or you have only urinated a small amount of very dark urine.  You have a fever.  You faint. MAKE SURE YOU:   Understand these instructions.  Will watch your condition.  Will get help right away if you are not doing well or get worse. Document Released: 01/16/2005 Document Revised: 04/10/2011 Document Reviewed: 09/05/2010 [employer] Patient Information 2015 [employer]. This information is not intended to replace advice given to you by your health care provider. Make sure you discuss any questions you have with your health care   provider.  

## 2014-06-01 ENCOUNTER — Ambulatory Visit (HOSPITAL_BASED_OUTPATIENT_CLINIC_OR_DEPARTMENT_OTHER): Payer: Medicare Other

## 2014-06-01 VITALS — BP 138/80 | HR 80 | Temp 98.4°F | Resp 18

## 2014-06-01 DIAGNOSIS — E860 Dehydration: Secondary | ICD-10-CM

## 2014-06-01 DIAGNOSIS — C9101 Acute lymphoblastic leukemia, in remission: Secondary | ICD-10-CM | POA: Diagnosis not present

## 2014-06-01 DIAGNOSIS — D89811 Chronic graft-versus-host disease: Secondary | ICD-10-CM

## 2014-06-01 MED ORDER — PROMETHAZINE HCL 25 MG/ML IJ SOLN
25.0000 mg | Freq: Once | INTRAMUSCULAR | Status: AC
  Administered 2014-06-01: 25 mg via INTRAVENOUS
  Filled 2014-06-01: qty 1

## 2014-06-01 MED ORDER — SODIUM CHLORIDE 0.9 % IV SOLN
1000.0000 mL | Freq: Once | INTRAVENOUS | Status: AC
  Administered 2014-06-01: 1000 mL via INTRAVENOUS

## 2014-06-01 MED ORDER — SODIUM CHLORIDE 0.9 % IJ SOLN
10.0000 mL | INTRAMUSCULAR | Status: DC | PRN
  Administered 2014-06-01: 10 mL
  Filled 2014-06-01: qty 10

## 2014-06-01 MED ORDER — HEPARIN SOD (PORK) LOCK FLUSH 100 UNIT/ML IV SOLN
500.0000 [IU] | Freq: Once | INTRAVENOUS | Status: AC | PRN
  Administered 2014-06-01: 500 [IU]
  Filled 2014-06-01: qty 5

## 2014-06-03 ENCOUNTER — Ambulatory Visit (HOSPITAL_BASED_OUTPATIENT_CLINIC_OR_DEPARTMENT_OTHER): Payer: Medicare Other

## 2014-06-03 VITALS — BP 115/79 | HR 102 | Temp 98.3°F | Resp 18

## 2014-06-03 DIAGNOSIS — C9101 Acute lymphoblastic leukemia, in remission: Secondary | ICD-10-CM | POA: Diagnosis not present

## 2014-06-03 DIAGNOSIS — D89811 Chronic graft-versus-host disease: Secondary | ICD-10-CM

## 2014-06-03 DIAGNOSIS — E860 Dehydration: Secondary | ICD-10-CM | POA: Diagnosis not present

## 2014-06-03 MED ORDER — HEPARIN SOD (PORK) LOCK FLUSH 100 UNIT/ML IV SOLN
500.0000 [IU] | Freq: Once | INTRAVENOUS | Status: AC | PRN
  Administered 2014-06-03: 500 [IU]
  Filled 2014-06-03: qty 5

## 2014-06-03 MED ORDER — SODIUM CHLORIDE 0.9 % IV SOLN
Freq: Once | INTRAVENOUS | Status: AC
  Administered 2014-06-03: 14:00:00 via INTRAVENOUS

## 2014-06-03 MED ORDER — SODIUM CHLORIDE 0.9 % IJ SOLN
10.0000 mL | INTRAMUSCULAR | Status: DC | PRN
  Administered 2014-06-03: 10 mL
  Filled 2014-06-03: qty 10

## 2014-06-03 MED ORDER — PROMETHAZINE HCL 25 MG/ML IJ SOLN
25.0000 mg | Freq: Once | INTRAMUSCULAR | Status: AC
  Administered 2014-06-03: 25 mg via INTRAVENOUS
  Filled 2014-06-03: qty 1

## 2014-06-03 NOTE — Patient Instructions (Signed)

## 2014-06-05 ENCOUNTER — Ambulatory Visit (HOSPITAL_BASED_OUTPATIENT_CLINIC_OR_DEPARTMENT_OTHER): Payer: Medicare Other

## 2014-06-05 VITALS — BP 107/68 | HR 97 | Temp 98.9°F | Resp 18

## 2014-06-05 DIAGNOSIS — E860 Dehydration: Secondary | ICD-10-CM

## 2014-06-05 DIAGNOSIS — C9101 Acute lymphoblastic leukemia, in remission: Secondary | ICD-10-CM

## 2014-06-05 DIAGNOSIS — N19 Unspecified kidney failure: Secondary | ICD-10-CM | POA: Diagnosis not present

## 2014-06-05 DIAGNOSIS — D89811 Chronic graft-versus-host disease: Secondary | ICD-10-CM

## 2014-06-05 MED ORDER — HEPARIN SOD (PORK) LOCK FLUSH 100 UNIT/ML IV SOLN
500.0000 [IU] | Freq: Once | INTRAVENOUS | Status: AC | PRN
  Administered 2014-06-05: 500 [IU]
  Filled 2014-06-05: qty 5

## 2014-06-05 MED ORDER — SODIUM CHLORIDE 0.9 % IV SOLN
Freq: Once | INTRAVENOUS | Status: AC
  Administered 2014-06-05: 14:00:00 via INTRAVENOUS

## 2014-06-05 MED ORDER — PROMETHAZINE HCL 25 MG/ML IJ SOLN
25.0000 mg | Freq: Once | INTRAMUSCULAR | Status: AC
  Administered 2014-06-05: 25 mg via INTRAVENOUS
  Filled 2014-06-05: qty 1

## 2014-06-05 MED ORDER — SODIUM CHLORIDE 0.9 % IJ SOLN
10.0000 mL | INTRAMUSCULAR | Status: DC | PRN
  Administered 2014-06-05: 10 mL
  Filled 2014-06-05: qty 10

## 2014-06-05 NOTE — Patient Instructions (Signed)
Dehydration, Adult Dehydration is when you lose more fluids from the body than you take in. Vital organs like the kidneys, brain, and heart cannot function without a proper amount of fluids and salt. Any loss of fluids from the body can cause dehydration.  CAUSES   Vomiting.  Diarrhea.  Excessive sweating.  Excessive urine output.  Fever. SYMPTOMS  Mild dehydration  Thirst.  Dry lips.  Slightly dry mouth. Moderate dehydration  Very dry mouth.  Sunken eyes.  Skin does not bounce back quickly when lightly pinched and released.  Dark urine and decreased urine production.  Decreased tear production.  Headache. Severe dehydration  Very dry mouth.  Extreme thirst.  Rapid, weak pulse (more than 100 beats per minute at rest).  Cold hands and feet.  Not able to sweat in spite of heat and temperature.  Rapid breathing.  Blue lips.  Confusion and lethargy.  Difficulty being awakened.  Minimal urine production.  No tears. DIAGNOSIS  Your caregiver will diagnose dehydration based on your symptoms and your exam. Blood and urine tests will help confirm the diagnosis. The diagnostic evaluation should also identify the cause of dehydration. TREATMENT  Treatment of mild or moderate dehydration can often be done at home by increasing the amount of fluids that you drink. It is best to drink small amounts of fluid more often. Drinking too much at one time can make vomiting worse. Refer to the home care instructions below. Severe dehydration needs to be treated at the hospital where you will probably be given intravenous (IV) fluids that contain water and electrolytes. HOME CARE INSTRUCTIONS   Ask your caregiver about specific rehydration instructions.  Drink enough fluids to keep your urine clear or pale yellow.  Drink small amounts frequently if you have nausea and vomiting.  Eat as you normally do.  Avoid:  Foods or drinks high in sugar.  Carbonated  drinks.  Juice.  Extremely hot or cold fluids.  Drinks with caffeine.  Fatty, greasy foods.  Alcohol.  Tobacco.  Overeating.  Gelatin desserts.  Wash your hands well to avoid spreading bacteria and viruses.  Only take over-the-counter or prescription medicines for pain, discomfort, or fever as directed by your caregiver.  Ask your caregiver if you should continue all prescribed and over-the-counter medicines.  Keep all follow-up appointments with your caregiver. SEEK MEDICAL CARE IF:  You have abdominal pain and it increases or stays in one area (localizes).  You have a rash, stiff neck, or severe headache.  You are irritable, sleepy, or difficult to awaken.  You are weak, dizzy, or extremely thirsty. SEEK IMMEDIATE MEDICAL CARE IF:   You are unable to keep fluids down or you get worse despite treatment.  You have frequent episodes of vomiting or diarrhea.  You have blood or green matter (bile) in your vomit.  You have blood in your stool or your stool looks black and tarry.  You have not urinated in 6 to 8 hours, or you have only urinated a small amount of very dark urine.  You have a fever.  You faint. MAKE SURE YOU:   Understand these instructions.  Will watch your condition.  Will get help right away if you are not doing well or get worse. Document Released: 01/16/2005 Document Revised: 04/10/2011 Document Reviewed: 09/05/2010 [employer] Patient Information 2015 [employer]. This information is not intended to replace advice given to you by your health care provider. Make sure you discuss any questions you have with your health care   provider.  

## 2014-06-10 ENCOUNTER — Encounter: Payer: Self-pay | Admitting: Endocrinology

## 2014-06-10 ENCOUNTER — Ambulatory Visit (INDEPENDENT_AMBULATORY_CARE_PROVIDER_SITE_OTHER): Payer: Medicare Other | Admitting: Endocrinology

## 2014-06-10 VITALS — BP 126/84 | HR 114 | Temp 97.7°F | Resp 16 | Ht 67.0 in | Wt 252.2 lb

## 2014-06-10 DIAGNOSIS — IMO0002 Reserved for concepts with insufficient information to code with codable children: Secondary | ICD-10-CM

## 2014-06-10 DIAGNOSIS — E1342 Other specified diabetes mellitus with diabetic polyneuropathy: Secondary | ICD-10-CM

## 2014-06-10 DIAGNOSIS — G629 Polyneuropathy, unspecified: Secondary | ICD-10-CM

## 2014-06-10 DIAGNOSIS — E1165 Type 2 diabetes mellitus with hyperglycemia: Secondary | ICD-10-CM

## 2014-06-10 DIAGNOSIS — E1142 Type 2 diabetes mellitus with diabetic polyneuropathy: Secondary | ICD-10-CM

## 2014-06-10 MED ORDER — GABAPENTIN 100 MG PO CAPS: 100.0000 mg | ORAL_CAPSULE | Freq: Three times a day (TID) | ORAL | Status: DC

## 2014-06-10 NOTE — Progress Notes (Signed)
Patient ID: Lynn [name], female   DOB: 09-07-1965, 49 y.o.   MRN: [id number]    Reason for Appointment: Followup for Type 2 Diabetes  Referring physician: Donnie Coffin  History of Present Illness:          Diagnosis: Type 2 diabetes mellitus, date of diagnosis: 2013        Past history: She weighed nearly 300 lb in 2013 around that time her diabetes was diagnosed Although her A1c at that time was reported at 7.4 she does not remember being told about diabetes and no treatment was given In 5/15 when she was getting steroids for her bone marrow transplant her blood sugar was over 400 She was started on insulin and had been taking a basal bolus insulin regimen subsequently while on prednisone Although she required small amounts of insulin she could not taper off insulin even when off prednisone  Recent history:   INSULIN regimen is described as: Novolog 10-12 acb and 14-18 units with meals, Lantus 12 in a.m.  She is still on prednisone although has reduced her dose to 10 mg  On her last visit because of high postprandial readings she was advised to take NOVOLOG before her meals and increase the dose if postprandial readings were high She had a significantly high A1c in 4/16  She also was taken off her Janumet because of liver dysfunction  Current blood sugar patterns and problems:  Her fasting blood sugars have again mostly not been checked  She is checking blood sugars somewhat sporadically and mostly after supper  Most of her sugars after supper are higher except recent reading of 140  Blood sugars in the afternoons usually excellent  She thinks she is taking her NovoLog with her evening meal rather than postprandially although she still says that sometimes she will take the insulin-dependent on the blood sugar level  Is not adjusting her NovoLog enough to control her post prandial readings as she still has several readings over 200 and overall average reading  after supper is also 200+  Her weight is continuing to go up possibly with continuing her prednisone   No hypoglycemia       Oral hypoglycemic drugs the patient is taking are: None    Side effects from medications have been: Diarrhea from Metformin over 1000 mg dose  Glucose monitoring:  done less than 1 time a day         Glucometer:  Accu-Chek    PRE-MEAL Breakfast  early afternoon  Dinner  PCS  Overall  Glucose range:  101   91-224   153   140-296    Mean/median:     225  186    Glycemic control:   Lab Results  Component Value Date   HGBA1C 8.5* 05/08/2014   HGBA1C 5.5 01/12/2014   HGBA1C 6.5 10/16/2013   Lab Results  Component Value Date   CREATININE 1.3* 05/20/2014    Self-care: The diet that the patient has been following is: tries to limit  Portions, may have some sweets    Meals: 2 meals per day.   eating eggs or cereal with breakfast at 10 am. Supper 5-6 pm  Exercise: walking some     Dietician visit: Most recent: 5/15 at the hospital              Compliance with the medical regimen: Fair Retinal exam: Most recent:.2013     Weight history: Wt Readings from Last 3 Encounters:  06/10/14 252 lb 3.2 oz (114.397 kg)  05/20/14 248 lb 8 oz (112.719 kg)  05/13/14 242 lb (109.77 kg)      Medication List       This list is accurate as of: 06/10/14  5:04 PM.  Always use your most recent med list.               acyclovir 800 MG tablet  Commonly known as:  ZOVIRAX  Take 1 tablet (800 mg total) by mouth 2 (two) times daily. Marland Kitchen     amitriptyline 10 MG tablet  Commonly known as:  ELAVIL  Take 1 tablet (10 mg total) by mouth at bedtime.     amLODipine 5 MG tablet  Commonly known as:  NORVASC  Take 5 mg by mouth daily.     cetirizine 10 MG tablet  Commonly known as:  ZYRTEC  Take 1 tablet (10 mg total) by mouth daily.     fluconazole 100 MG tablet  Commonly known as:  DIFLUCAN  Take 1 tablet (100 mg total) by mouth daily.     gabapentin 100 MG capsule    Commonly known as:  NEURONTIN  Take 1 capsule (100 mg total) by mouth 3 (three) times daily.     insulin aspart 100 UNIT/ML FlexPen  Commonly known as:  NOVOLOG  Inject 16 Units into the skin 3 (three) times daily with meals. (If you have eaten more than 50% of your meal.) Plus 18-20 extra units if sugar is over 250     Insulin Glargine 100 UNIT/ML Solostar Pen  Commonly known as:  LANTUS  Inject 12 Units into the skin every morning. Sliding scale.     LORazepam 1 MG tablet  Commonly known as:  ATIVAN  Take 1 mg by mouth as needed.     multivitamin tablet  Take 1 tablet by mouth daily.     NOVOFINE PLUS 32G X 4 MM Misc  Generic drug:  Insulin Pen Needle     ondansetron 4 MG disintegrating tablet  Commonly known as:  ZOFRAN ODT  Take 1 tablet (4 mg total) by mouth every 8 (eight) hours as needed for nausea.     predniSONE 10 MG tablet  Commonly known as:  DELTASONE  Take 10 mg by mouth daily.     SLOW-MAG 64 MG Tbec SR tablet  Generic drug:  magnesium chloride  Take 2 tablets by mouth 3 (three) times daily.     sulfamethoxazole-trimethoprim 800-160 MG per tablet  Commonly known as:  BACTRIM DS  Take 1 tablet by mouth every Monday, Wednesday, and Friday. Takes 1 tablet Monday-Wednesday-Friday     tacrolimus 0.5 MG capsule  Commonly known as:  PROGRAF  Take 1.5 mg by mouth 2 (two) times daily.     traMADol 50 MG tablet  Commonly known as:  ULTRAM  Take 1 tablet (50 mg total) by mouth every 6 (six) hours as needed for moderate pain.        Allergies:  Allergies  Allergen Reactions  . Other Rash    Please do not use clear tegaderm dressings as patient states they pull her skin off.  Mepilex is fine.    Past Medical History  Diagnosis Date  . Hypertension   . Brain bleed     07/04/11  . Pulmonary embolism     06/22/11  . Headache(784.0)   . Arthritis   . Anemia   . ALL (acute lymphoblastic leukemia)   . Leukemia   .  Diabetes mellitus without complication    . Pneumonia   . Thrush of mouth and esophagus 04/09/2014    Past Surgical History  Procedure Laterality Date  . Cesarean section  2000  . Foot surgery    . Craniotomy  07/04/11  . [place] filter    . Bone marrow transplant    . Lung biopsy  06/19/13  . Eye surgery Right 2005    repair crossed eye    Family History  Problem Relation Age of Onset  . Pancreatic cancer Father   . Cancer Mother     colon  . Pancreatic cancer Mother   . Diabetes Neg Hx   . Asthma Son   . Hypertension Sister     Social History:  reports that she has never smoked. She has never used smokeless tobacco. She reports that she does not drink alcohol or use illicit drugs.    Review of Systems   She is still having problems with tingling and burning in her hands and feet and this is usually much worse if she is putting her hands or feet in warm water and when bathing.  She also has some symptoms otherwise  She was given gabapentin by neurologist but she was afraid to take it because of side effects, has a 300 mg prescription      Lipids: These are followed by PCP and tends to have high triglycerides and mildly increased LDL      Lab Results  Component Value Date   CHOL 208* 01/12/2014   HDL 62.30 01/12/2014   LDLDIRECT 109.3 01/12/2014   TRIG 258.0* 01/12/2014   CHOLHDL 3 01/12/2014    She is currently taking amlodipine for hypertension  She is being followed at [facility] for her various problems including transplant rejection Apparently liver functions have been consistently better recently     Thyroid:   She had a thyroid biopsy in 6/14 for a left-sided thyroid nodule which was benign  Physical Examination:  BP 126/84 mmHg  Pulse 114  Temp(Src) 97.7 F (36.5 C)  Resp 16  Ht 5\' 7"  (1.702 m)  Wt 252 lb 3.2 oz (114.397 kg)  BMI 39.49 kg/m2  SpO2 98%  LMP 08/09/2011      no  edema present Diabetic foot exam shows slightly decreased monofilament sensation in the toes and plantar  surfaces, no skin lesions or ulcers on the feet and normal pedal pulses     ASSESSMENT:  Diabetes type 2, uncontrolled  Her blood sugars are still significantly high after her evening meal Although she thinks she is generally trying to take her NovoLog before eating not clear if she is consistent with this and is waiting to low sugar goes up to take the NovoLog Again discussed timing of the NovoLog and need to match her postprandial blood sugar increase with the insulin action  She understands that blood sugars do go up significantly with higher carbohydrate intake and she does not adjust her insulin accordingly Most likely will need up to 20 units of insulin for hyperglycemic index meals such as pastas and more bread and potatoes  the need  Basal insulin: Most likely is not getting excessive doses as she is not getting hypoglycemia but again is not checking fasting readings to help adjust the dose Discussed that she will probably need to reduce the dose once her prednisone is being tapered  She is currently not taking insulin sensitizer, previously on Janumet  NEUROPATHY: She is having some paresthesia  in her hands and feet and discussed that this may be worse because of her recent hyperglycemia  PLAN:   Continue taking Lantus 12 units and check some readings in the mornings on waking up.  May need lower doses if fasting blood sugars are coming down and discussed blood sugar targets in the morning  Again discussed the importance of her taking NovoLog 10-15 minutes before eating regardless of the blood sugar  She will try to keep her postprandial readings under at least 180  She can continue same doses of insulin the morning but take 2-4 units more at suppertime  Balanced meals with some protein at each meal and limit carbohydrates  Consider restarting Janumet if weight gain continues to be a problem  Continue regular walking but increase duration and frequency  May take 100 mg  gabapentin as needed for neuropathic symptoms since her liver functions are normal  Counseling time on subjects discussed above is over 50% of today's 25 minute visit   , 06/10/2014, 5:04 PM   Note: This office note was prepared with Dragon voice recognition system technology. Any transcriptional errors that result from this process are unintentional.

## 2014-06-11 ENCOUNTER — Telehealth: Payer: Self-pay | Admitting: Endocrinology

## 2014-06-11 ENCOUNTER — Other Ambulatory Visit: Payer: Self-pay | Admitting: *Deleted

## 2014-06-11 MED ORDER — INSULIN ASPART 100 UNIT/ML FLEXPEN: PEN_INJECTOR | SUBCUTANEOUS | Status: DC

## 2014-06-11 NOTE — Telephone Encounter (Signed)
Patient need Novalog faxed to [place] [facility].

## 2014-06-11 NOTE — Telephone Encounter (Signed)
rx sent

## 2014-06-19 ENCOUNTER — Telehealth: Payer: Self-pay | Admitting: *Deleted

## 2014-06-19 ENCOUNTER — Other Ambulatory Visit: Payer: Self-pay | Admitting: *Deleted

## 2014-06-19 ENCOUNTER — Other Ambulatory Visit: Payer: Self-pay | Admitting: Hematology and Oncology

## 2014-06-19 DIAGNOSIS — C9100 Acute lymphoblastic leukemia not having achieved remission: Secondary | ICD-10-CM

## 2014-06-19 MED ORDER — SULFAMETHOXAZOLE-TRIMETHOPRIM 800-160 MG PO TABS
1.0000 | ORAL_TABLET | ORAL | Status: DC
Start: 2014-06-19 — End: 2014-06-19

## 2014-06-19 MED ORDER — SULFAMETHOXAZOLE-TRIMETHOPRIM 800-160 MG PO TABS: 1.0000 | ORAL_TABLET | ORAL | Status: DC

## 2014-06-19 NOTE — Telephone Encounter (Signed)
PRESCRIPTION CALLED TO PHARMACY. 

## 2014-07-10 ENCOUNTER — Other Ambulatory Visit: Payer: Self-pay | Admitting: Hematology and Oncology

## 2014-08-10 ENCOUNTER — Other Ambulatory Visit (INDEPENDENT_AMBULATORY_CARE_PROVIDER_SITE_OTHER): Payer: Medicare Other

## 2014-08-10 DIAGNOSIS — E1165 Type 2 diabetes mellitus with hyperglycemia: Secondary | ICD-10-CM | POA: Diagnosis not present

## 2014-08-10 DIAGNOSIS — IMO0002 Reserved for concepts with insufficient information to code with codable children: Secondary | ICD-10-CM

## 2014-08-10 LAB — GLUCOSE, RANDOM: Glucose, Bld: 161 mg/dL — ABNORMAL HIGH (ref 70–99)

## 2014-08-10 LAB — POCT URINALYSIS DIPSTICK
BILIRUBIN UA: NEGATIVE
Glucose, UA: NEGATIVE
Nitrite, UA: NEGATIVE
SPEC GRAV UA: 1.01
Urobilinogen, UA: 0.2
pH, UA: 6.5

## 2014-08-10 LAB — MICROALBUMIN / CREATININE URINE RATIO
CREATININE, U: 168.9 mg/dL
MICROALB UR: 1.6 mg/dL (ref 0.0–1.9)
Microalb Creat Ratio: 0.9 mg/g (ref 0.0–30.0)

## 2014-08-10 LAB — HEMOGLOBIN A1C: HEMOGLOBIN A1C: 6.5 % (ref 4.6–6.5)

## 2014-08-13 ENCOUNTER — Encounter: Payer: Self-pay | Admitting: Endocrinology

## 2014-08-13 ENCOUNTER — Ambulatory Visit (INDEPENDENT_AMBULATORY_CARE_PROVIDER_SITE_OTHER): Payer: Medicare Other | Admitting: Endocrinology

## 2014-08-13 VITALS — BP 112/80 | HR 113 | Temp 97.9°F | Resp 16 | Ht 67.0 in | Wt 248.2 lb

## 2014-08-13 DIAGNOSIS — E785 Hyperlipidemia, unspecified: Secondary | ICD-10-CM | POA: Diagnosis not present

## 2014-08-13 DIAGNOSIS — E1165 Type 2 diabetes mellitus with hyperglycemia: Secondary | ICD-10-CM

## 2014-08-13 DIAGNOSIS — E1342 Other specified diabetes mellitus with diabetic polyneuropathy: Secondary | ICD-10-CM | POA: Diagnosis not present

## 2014-08-13 DIAGNOSIS — G629 Polyneuropathy, unspecified: Secondary | ICD-10-CM

## 2014-08-13 DIAGNOSIS — E1142 Type 2 diabetes mellitus with diabetic polyneuropathy: Secondary | ICD-10-CM

## 2014-08-13 DIAGNOSIS — IMO0002 Reserved for concepts with insufficient information to code with codable children: Secondary | ICD-10-CM

## 2014-08-13 MED ORDER — SITAGLIP PHOS-METFORMIN HCL ER 100-1000 MG PO TB24: ORAL_TABLET | ORAL | Status: DC

## 2014-08-13 NOTE — Patient Instructions (Addendum)
Check blood sugars on waking up ..2-3.Marland Kitchen times a week Also check blood sugars about 2 hours after a meal and do this after different meals by rotation  Recommended blood sugar levels on waking up is 90-130 and about 2 hours after meal is 140-180 Please bring blood sugar monitor to each visit.  Reduce Novolog at supper for smaller meals and salad  Janumet at supper  Reduce doses 2-3 unts after 1 week

## 2014-08-13 NOTE — Progress Notes (Signed)
Patient ID: Lynn [name], female   DOB: 03-06-1965, 49 y.o.   MRN: [id number]    Reason for Appointment: Followup for Type 2 Diabetes  Referring physician: Donnie Coffin  History of Present Illness:          Diagnosis: Type 2 diabetes mellitus, date of diagnosis: 2013        Past history: She weighed nearly 300 lb in 2013 around that time her diabetes was diagnosed Although her A1c at that time was reported at 7.4 she does not remember being told about diabetes and no treatment was given In 5/15 when she was getting steroids for her bone marrow transplant her blood sugar was over 400 She was started on insulin and had been taking a basal bolus insulin regimen subsequently while on prednisone Although she required small amounts of insulin she could not taper off insulin even when off prednisone  Recent history:   INSULIN regimen is described as: Novolog 10 acb and 16 units with meals, Lantus 12 in a.m.  She is now only on 7.5 mg prednisone  She has been on insulin since starting prednisone but more recently has not been able to take Janumet because of liver dysfunction Her liver dysfunction apparently has resolved now    More recently  She is requiring relatively smaller amounts of insulin and she has cut back on her Novolog on her own.  She had a significantly high A1c in 4/16 but this much better at 6.5  Current blood sugar patterns and problems:  Her fasting blood sugars have again mostly near-normal and generally below 100.    She has started doing better with taking her NOVOLOG right before eating instead of after eating based on postprandial readings   She has had occasional high blood sugar after supper likely to be from inconsistent diet.   However she has had readings like 76 and 67 late at night also. She does not know why her sugar was 67 at 2 AM   She has fairly good blood sugars after her first meal which is nearly at mid day.   She still has  difficulty losing weight he is not able to do much exercise because of her numerous other medical problems       Oral hypoglycemic drugs the patient is taking are: None    Side effects from medications have been: Diarrhea from Metformin over 1000 mg dose  Glucose monitoring:  done less than 1 time a day         Glucometer:  Accu-Chek    Mean values apply above for all meters except median for One Touch  PRE-MEAL Fasting  3 PM Dinner Bedtime Overall  Glucose range:  94-96  106-196  71  76-202   Mean/median:        Glycemic control:   Lab Results  Component Value Date   HGBA1C 6.5 08/10/2014   HGBA1C 8.5* 05/08/2014   HGBA1C 5.5 01/12/2014   Lab Results  Component Value Date   MICROALBUR 1.6 08/10/2014   CREATININE 1.3* 05/20/2014    Self-care: The diet that the patient has been following is: tries to limit meal size  Meals: 2 meals per day.   eating eggs or cereal with breakfast at 10 am. Supper 5-6 pm  Exercise: walking some , limited by leg pain   Dietician visit: Most recent: 5/15 at the hospital              Compliance with the  medical regimen: Fair  Weight history: Wt Readings from Last 3 Encounters:  08/13/14 248 lb 3.2 oz (112.583 kg)  06/10/14 252 lb 3.2 oz (114.397 kg)  05/20/14 248 lb 8 oz (112.719 kg)      Medication List       This list is accurate as of: 08/13/14  9:33 PM.  Always use your most recent med list.               acyclovir 800 MG tablet  Commonly known as:  ZOVIRAX  Take 1 tablet (800 mg total) by mouth 2 (two) times daily. Marland Kitchen     amitriptyline 10 MG tablet  Commonly known as:  ELAVIL  Take 1 tablet (10 mg total) by mouth at bedtime.     amLODipine 5 MG tablet  Commonly known as:  NORVASC  Take 5 mg by mouth daily.     cetirizine 10 MG tablet  Commonly known as:  ZYRTEC  Take 1 tablet (10 mg total) by mouth daily.     fluconazole 100 MG tablet  Commonly known as:  DIFLUCAN  Take 1 tablet (100 mg total) by mouth daily.      folic acid 629 MCG tablet  Commonly known as:  FOLVITE  Take 400 mcg by mouth daily.     gabapentin 100 MG capsule  Commonly known as:  NEURONTIN  Take 1 capsule (100 mg total) by mouth 3 (three) times daily.     insulin aspart 100 UNIT/ML FlexPen  Commonly known as:  NOVOLOG  Inject 16 units with each meal  Plus 18-20 extra units if sugar is over 250     Insulin Glargine 100 UNIT/ML Solostar Pen  Commonly known as:  LANTUS  Inject 12 Units into the skin every morning. Sliding scale.     LORazepam 1 MG tablet  Commonly known as:  ATIVAN  Take 1 mg by mouth as needed.     multivitamin tablet  Take 1 tablet by mouth daily.     NOVOFINE PLUS 32G X 4 MM Misc  Generic drug:  Insulin Pen Needle     ondansetron 4 MG disintegrating tablet  Commonly known as:  ZOFRAN ODT  Take 1 tablet (4 mg total) by mouth every 8 (eight) hours as needed for nausea.     predniSONE 10 MG tablet  Commonly known as:  DELTASONE  Take 7.5 mg by mouth daily.     SINGULAIR 5 MG chewable tablet  Generic drug:  montelukast  Chew 5 mg by mouth at bedtime.     SitaGLIPtin-MetFORMIN HCl [phone] MG Tb24  Commonly known as:  JANUMET XR  1 tab at supper     SLOW-MAG 64 MG Tbec SR tablet  Generic drug:  magnesium chloride  Take 2 tablets by mouth 3 (three) times daily.     sulfamethoxazole-trimethoprim 800-160 MG per tablet  Commonly known as:  BACTRIM DS,SEPTRA DS  Take 1 tablet by mouth 3 (three) times a week. Monday,Wednesday, and Friday     tacrolimus 0.5 MG capsule  Commonly known as:  PROGRAF  Take 1.5 mg by mouth 2 (two) times daily.     traMADol 50 MG tablet  Commonly known as:  ULTRAM  Take 1 tablet (50 mg total) by mouth every 6 (six) hours as needed for moderate pain.        Allergies:  Allergies  Allergen Reactions  . Other Rash    Please do not use clear tegaderm dressings as patient  states they pull her skin off.  Mepilex is fine.    Past Medical History  Diagnosis Date  .  Hypertension   . Brain bleed     07/04/11  . Pulmonary embolism     06/22/11  . Headache(784.0)   . Arthritis   . Anemia   . ALL (acute lymphoblastic leukemia)   . Leukemia   . Diabetes mellitus without complication   . Pneumonia   . Thrush of mouth and esophagus 04/09/2014    Past Surgical History  Procedure Laterality Date  . Cesarean section  2000  . Foot surgery    . Craniotomy  07/04/11  . [place] filter    . Bone marrow transplant    . Lung biopsy  06/19/13  . Eye surgery Right 2005    repair crossed eye    Family History  Problem Relation Age of Onset  . Pancreatic cancer Father   . Cancer Mother     colon  . Pancreatic cancer Mother   . Diabetes Neg Hx   . Asthma Son   . Hypertension Sister     Social History:  reports that she has never smoked. She has never used smokeless tobacco. She reports that she does not drink alcohol or use illicit drugs.    Review of Systems   She was having problems with tingling and burning in her hands and feet and this is usually much worse if she is putting her hands or feet in warm water and when bathing.  She also has some symptoms otherwise  She was given gabapentin but is not taking this now since symptoms are better.   last foot exam was in 05/2014      Lipids: These are followed by PCP and tends to have high triglycerides and mildly increased LDL      Lab Results  Component Value Date   CHOL 208* 01/12/2014   HDL 62.30 01/12/2014   LDLDIRECT 109.3 01/12/2014   TRIG 258.0* 01/12/2014   CHOLHDL 3 01/12/2014    She is currently taking amlodipine for hypertension  She is being followed at [facility] for her various problems including transplant rejection  Her liver functions have been consistently better recently     Thyroid:   She had a thyroid biopsy in 6/14 for a left-sided thyroid nodule which was benign  Physical Examination:  BP 112/80 mmHg  Pulse 113  Temp(Src) 97.9 F (36.6 C)  Resp 16  Ht 5\' 7"   (1.702 m)  Wt 248 lb 3.2 oz (112.583 kg)  BMI 38.86 kg/m2  SpO2 99%  LMP 08/09/2011   no edema     ASSESSMENT:  Diabetes type 2, uncontrolled  Her blood sugars are significantly better now since her last visit probably because of better compliance with taking mealtime insulin before eating in the evening  Also her fasting blood sugars are fairly consistent in the 90s recently  has had some low normal sugars at bedtime and a episode of low sugar at 2 AM with glucose 67   Appears that most likely since she is requiring less prednisone she is not his insulin resistant.   Although she had never been able to get off insulin even with stopping prednisone previously she may benefit from a trial of Janumet again.   She had tolerated extended release metformin previously.   Currently she is still having some inconsistent readings after her evening meal although her overall average blood sugar recently is only 117 an  A1c is improving  NEUROPATHY:  Improved   HYPERLIPIDEMIA: We will need to reassess this.  May be able to treat her since her liver functions are better now   PLAN:    Trial of Janumet XR 100/ 1000    She will reduce her Lantus to 10 units.  Most likely May be able to reduce her Novolog also based on blood sugar patterns.  Discussed how to adjust both insulin doses.  Encouraged her to be more active.   More blood sugar monitoring while adjusting her insulin  Counseling time on subjects discussed above is over 50% of today's 25 minute visit   , 08/13/2014, 9:33 PM   Note: This office note was prepared with Estate agent. Any transcriptional errors that result from this process are unintentional.

## 2014-09-01 ENCOUNTER — Other Ambulatory Visit: Payer: Self-pay | Admitting: *Deleted

## 2014-09-01 ENCOUNTER — Encounter: Payer: Self-pay | Admitting: *Deleted

## 2014-09-01 NOTE — Progress Notes (Signed)
Refill request for Bactrim received from [facility].  Dr. Alvy Bimler declined refill states pt is not being followed by our clinic anymore.  Faxed this reply back to [facility].

## 2014-10-09 ENCOUNTER — Other Ambulatory Visit (INDEPENDENT_AMBULATORY_CARE_PROVIDER_SITE_OTHER): Payer: Medicare Other

## 2014-10-09 ENCOUNTER — Other Ambulatory Visit: Payer: Medicare Other

## 2014-10-09 DIAGNOSIS — E1165 Type 2 diabetes mellitus with hyperglycemia: Secondary | ICD-10-CM | POA: Diagnosis not present

## 2014-10-09 DIAGNOSIS — IMO0002 Reserved for concepts with insufficient information to code with codable children: Secondary | ICD-10-CM

## 2014-10-09 DIAGNOSIS — E785 Hyperlipidemia, unspecified: Secondary | ICD-10-CM | POA: Diagnosis not present

## 2014-10-09 LAB — COMPREHENSIVE METABOLIC PANEL
ALT: 20 U/L (ref 0–35)
AST: 26 U/L (ref 0–37)
Albumin: 3.9 g/dL (ref 3.5–5.2)
Alkaline Phosphatase: 93 U/L (ref 39–117)
BUN: 17 mg/dL (ref 6–23)
CALCIUM: 9 mg/dL (ref 8.4–10.5)
CHLORIDE: 102 meq/L (ref 96–112)
CO2: 30 meq/L (ref 19–32)
CREATININE: 1.38 mg/dL — AB (ref 0.40–1.20)
GFR: 52.03 mL/min — ABNORMAL LOW (ref >60.00)
GLUCOSE: 107 mg/dL — AB (ref 70–99)
Potassium: 3.4 mEq/L — ABNORMAL LOW (ref 3.5–5.1)
SODIUM: 141 meq/L (ref 135–145)
Total Bilirubin: 0.3 mg/dL (ref 0.2–1.2)
Total Protein: 6.4 g/dL (ref 6.0–8.3)

## 2014-10-09 LAB — LIPID PANEL
CHOL/HDL RATIO: 3
Cholesterol: 222 mg/dL — ABNORMAL HIGH (ref 0–200)
HDL: 80.3 mg/dL (ref >39.00)
LDL CALC: 108 mg/dL — AB (ref 0–99)
NONHDL: 141.77
TRIGLYCERIDES: 169 mg/dL — AB (ref 0.0–149.0)
VLDL: 33.8 mg/dL (ref 0.0–40.0)

## 2014-10-10 LAB — FRUCTOSAMINE: FRUCTOSAMINE: 262 umol/L (ref 0–285)

## 2014-10-14 ENCOUNTER — Ambulatory Visit (INDEPENDENT_AMBULATORY_CARE_PROVIDER_SITE_OTHER): Payer: Medicare Other | Admitting: Endocrinology

## 2014-10-14 ENCOUNTER — Encounter: Payer: Self-pay | Admitting: Endocrinology

## 2014-10-14 VITALS — BP 124/82 | HR 110 | Temp 98.3°F | Resp 16 | Ht 67.0 in | Wt 248.0 lb

## 2014-10-14 DIAGNOSIS — E785 Hyperlipidemia, unspecified: Secondary | ICD-10-CM | POA: Diagnosis not present

## 2014-10-14 DIAGNOSIS — E1165 Type 2 diabetes mellitus with hyperglycemia: Secondary | ICD-10-CM | POA: Diagnosis not present

## 2014-10-14 DIAGNOSIS — IMO0002 Reserved for concepts with insufficient information to code with codable children: Secondary | ICD-10-CM

## 2014-10-14 MED ORDER — POTASSIUM CHLORIDE CRYS ER 20 MEQ PO TBCR: 20.0000 meq | EXTENDED_RELEASE_TABLET | Freq: Every day | ORAL | Status: DC

## 2014-10-14 MED ORDER — PRAVASTATIN SODIUM 20 MG PO TABS: 20.0000 mg | ORAL_TABLET | Freq: Every day | ORAL | Status: DC

## 2014-10-14 NOTE — Progress Notes (Signed)
Patient ID: Lynn [name], female   DOB: [birth year], 49 y.o.   MRN: [id number]    Reason for Appointment: Followup for Type 2 Diabetes  Referring physician: Donnie Coffin  History of Present Illness:          Diagnosis: Type 2 diabetes mellitus, date of diagnosis: 2013        Past history: She weighed nearly 300 lb in 2013 around that time her diabetes was diagnosed Although her A1c at that time was reported at 7.4 she does not remember being told about diabetes and no treatment was given In 5/15 when she was getting steroids for her bone marrow transplant her blood sugar was over 400 She was started on insulin and had been taking a basal bolus insulin regimen subsequently while on prednisone Although she required small amounts of insulin she could not taper off insulin even when off prednisone  Recent history:   INSULIN regimen is described as: Novolog 10-12 acb and 16 units with meals, Lantus 12 in a.m.  She has been on insulin since starting prednisone and also previously had not been able to take Janumet because of liver dysfunction Her liver dysfunction apparently has resolved   She is now taking 20 mg prednisone more recently Because of her taking high doses of prednisone she has not been able to taper her insulin down  Also she is continuing to take her Janumet XR which was started on the last visit  She had a significantly high A1c in 4/16 but this much better at 6.5  Current blood sugar patterns and problems:  Her fasting blood sugars have been mildly increased although not consistent, previously had been able to reduce her insulin to 10 units Lantus  She is stating that she is taking her Novolog before eating supper as directed instead of waiting till the sugar goes up  However she is still not taking enough insulin for her evening meal with readings over 200 periodically  She is empirically taking 10-12 units in the morning for breakfast but does not know  what her sugars are after breakfast.  She sometimes checks her sugars in the early afternoon but she thinks this is still before late breakfast  She did not understand that prednisone raises her blood sugars during the day  Overall blood sugars are high in the evenings and somewhat difficult to be sure if some of her readings are after meals.   She still has difficulty losing weight with prednisone  She is now trying to do a little exercise       Oral hypoglycemic drugs the patient is taking are: None    Side effects from medications have been: Diarrhea from Metformin over 1000 mg dose  Glucose monitoring:  done less than 1 time a day         Glucometer:  Accu-Chek    Mean values apply above for all meters except median for One Touch  PRE-MEAL Fasting  1 PM  Dinner Bedtime Overall  Glucose range:  102-137   88-152    166-257    Mean/median:  118     190   150     Glycemic control:   Lab Results  Component Value Date   HGBA1C 6.5 08/10/2014   HGBA1C 8.5* 05/08/2014   HGBA1C 5.5 01/12/2014   Lab Results  Component Value Date   MICROALBUR 1.6 08/10/2014   LDLCALC 108* 10/09/2014   CREATININE 1.38* 10/09/2014    Self-care: The  diet that the patient has been following is: tries to limit meal size  Meals: 2 meals per day.   eating eggs or cereal with breakfast at 10 am. Supper 5-6 pm  Exercise: walking more recently , limited by leg pain   Dietician visit: Most recent: 5/15 at the hospital              Compliance with the medical regimen: Fair  Weight history: Wt Readings from Last 3 Encounters:  10/14/14 248 lb (112.492 kg)  08/13/14 248 lb 3.2 oz (112.583 kg)  06/10/14 252 lb 3.2 oz (114.397 kg)      Medication List       This list is accurate as of: 10/14/14 10:24 AM.  Always use your most recent med list.               acyclovir 800 MG tablet  Commonly known as:  ZOVIRAX  Take 1 tablet (800 mg total) by mouth 2 (two) times daily. Marland Kitchen     amitriptyline 10 MG  tablet  Commonly known as:  ELAVIL  Take 1 tablet (10 mg total) by mouth at bedtime.     amLODipine 5 MG tablet  Commonly known as:  NORVASC  Take 5 mg by mouth daily.     cetirizine 10 MG tablet  Commonly known as:  ZYRTEC  Take 1 tablet (10 mg total) by mouth daily.     fluconazole 100 MG tablet  Commonly known as:  DIFLUCAN  Take 1 tablet (100 mg total) by mouth daily.     Fluticasone-Salmeterol 100-50 MCG/DOSE Aepb  Commonly known as:  ADVAIR  Inhale 1 puff into the lungs 2 (two) times daily.     folic acid 865 MCG tablet  Commonly known as:  FOLVITE  Take 400 mcg by mouth daily.     gabapentin 100 MG capsule  Commonly known as:  NEURONTIN  Take 1 capsule (100 mg total) by mouth 3 (three) times daily.     GLEEVEC 100 MG tablet  Generic drug:  imatinib  Take 100 mg by mouth daily. Take with meals and large glass of water.Caution:Chemotherapy     hydrochlorothiazide 12.5 MG capsule  Commonly known as:  MICROZIDE  Take 12.5 mg by mouth daily.     insulin aspart 100 UNIT/ML FlexPen  Commonly known as:  NOVOLOG  Inject 16 units with each meal  Plus 18-20 extra units if sugar is over 250     Insulin Glargine 100 UNIT/ML Solostar Pen  Commonly known as:  LANTUS  Inject 12 Units into the skin every morning. Sliding scale.     LORazepam 1 MG tablet  Commonly known as:  ATIVAN  Take 1 mg by mouth as needed.     multivitamin tablet  Take 1 tablet by mouth daily.     NOVOFINE PLUS 32G X 4 MM Misc  Generic drug:  Insulin Pen Needle     ondansetron 4 MG disintegrating tablet  Commonly known as:  ZOFRAN ODT  Take 1 tablet (4 mg total) by mouth every 8 (eight) hours as needed for nausea.     predniSONE 10 MG tablet  Commonly known as:  DELTASONE  Take 20 mg by mouth daily.     SINGULAIR 5 MG chewable tablet  Generic drug:  montelukast  Chew 5 mg by mouth at bedtime.     sirolimus 2 MG tablet  Commonly known as:  RAPAMUNE  Take 2 mg by mouth daily.  SitaGLIPtin-MetFORMIN HCl [phone] MG Tb24  Commonly known as:  JANUMET XR  1 tab at supper     SLOW-MAG 64 MG Tbec SR tablet  Generic drug:  magnesium chloride  Take 2 tablets by mouth 3 (three) times daily.     sulfamethoxazole-trimethoprim 800-160 MG per tablet  Commonly known as:  BACTRIM DS,SEPTRA DS  Take 1 tablet by mouth 3 (three) times a week. Monday,Wednesday, and Friday     traMADol 50 MG tablet  Commonly known as:  ULTRAM  Take 1 tablet (50 mg total) by mouth every 6 (six) hours as needed for moderate pain.        Allergies:  Allergies  Allergen Reactions  . Other Rash    Please do not use clear tegaderm dressings as patient states they pull her skin off.  Mepilex is fine.    Past Medical History  Diagnosis Date  . Hypertension   . Brain bleed     07/04/11  . Pulmonary embolism     06/22/11  . Headache(784.0)   . Arthritis   . Anemia   . ALL (acute lymphoblastic leukemia)   . Leukemia   . Diabetes mellitus without complication   . Pneumonia   . Thrush of mouth and esophagus 04/09/2014    Past Surgical History  Procedure Laterality Date  . Cesarean section  2000  . Foot surgery    . Craniotomy  07/04/11  . [place] filter    . Bone marrow transplant    . Lung biopsy  06/19/13  . Eye surgery Right 2005    repair crossed eye    Family History  Problem Relation Age of Onset  . Pancreatic cancer Father   . Cancer Mother     colon  . Pancreatic cancer Mother   . Diabetes Neg Hx   . Asthma Son   . Hypertension Sister     Social History:  reports that she has never smoked. She has never used smokeless tobacco. She reports that she does not drink alcohol or use illicit drugs.    Review of Systems   She was having problems with tingling and burning in her hands and feet and this is usually much worse if she is putting her hands or feet in warm water and when bathing.  No symptoms recently   Last foot exam was in 05/2014      Lipids: She tends to  have high triglycerides and mildly increased LDL which is persistent and has not been offered to statin treatment by her PCP, also not being followed there regularly      Lab Results  Component Value Date   CHOL 222* 10/09/2014   HDL 80.30 10/09/2014   LDLCALC 108* 10/09/2014   LDLDIRECT 109.3 01/12/2014   TRIG 169.0* 10/09/2014   CHOLHDL 3 10/09/2014    She is currently taking amlodipine for hypertension  She is being followed at [facility] for her various problems including transplant rejection  Her liver functions have been consistently better recently     Thyroid:   She had a thyroid biopsy in 6/14 for a left-sided thyroid nodule which was benign  Physical Examination:  BP 124/82 mmHg  Pulse 110  Temp(Src) 98.3 F (36.8 C)  Resp 16  Ht 5\' 7"  (1.702 m)  Wt 248 lb (112.492 kg)  BMI 38.83 kg/m2  SpO2 97%  LMP 08/09/2011   no edema     ASSESSMENT:  Diabetes type 2, uncontrolled  Her blood sugars  are fair with tendency to high postprandial readings in the evenings However she may be having high readings in the afternoon after her first meal since she is taking prednisone Discussed day-to-day management including adjustment of Lantus based on fasting readings, checking readings after her first meal, probably needing more insulin for her evening meal since readings late at night are mostly high  NEUROPATHY:  Improved   HYPERLIPIDEMIA: We will need to start her on a statin drug because of persistent hypercholesterolemia, liver functions have been normal recently Discussed benefits of statin drugs and cardiovascular prevention with diabetes   PLAN:    As above, no change in insulin as yet  Emphasized the need to check more readings about 2 hours after eating her evening meal and to take Novolog before eating regardless of blood sugar level  Continue to increase exercise  Pravastatin 20 mg daily and consider increasing the dose  Continue follow-up with PCP for  hypertension  She will have her influenza vaccine done with PCP  Counseling time on subjects discussed above is over 50% of today's 25 minute visit  Patient Instructions  Take more sugars after lunch and if > 150 add 2-4 units more  Novolog 16-20 at dinner  Check blood sugars on waking up .. 3 .. times a week Also check blood sugars about 2 hours after a meal and do this after different meals by rotation Recommended blood sugar levels on waking up is 90-130 and about 2 hours after meal is 140-180 Please bring blood sugar monitor to each visit.         , 10/14/2014, 10:24 AM   Note: This office note was prepared with Dragon voice recognition system technology. Any transcriptional errors that result from this process are unintentional.

## 2014-10-14 NOTE — Patient Instructions (Signed)
Take more sugars after lunch and if > 150 add 2-4 units more  Novolog 16-20 at dinner  Check blood sugars on waking up .. 3 .. times a week Also check blood sugars about 2 hours after a meal and do this after different meals by rotation Recommended blood sugar levels on waking up is 90-130 and about 2 hours after meal is 140-180 Please bring blood sugar monitor to each visit.

## 2014-11-16 ENCOUNTER — Telehealth: Payer: Self-pay | Admitting: Endocrinology

## 2014-11-16 ENCOUNTER — Other Ambulatory Visit: Payer: Self-pay | Admitting: *Deleted

## 2014-11-16 MED ORDER — INSULIN GLARGINE 100 UNIT/ML SOLOSTAR PEN: PEN_INJECTOR | SUBCUTANEOUS | Status: DC

## 2014-11-16 NOTE — Telephone Encounter (Signed)
rx sent

## 2014-11-16 NOTE — Telephone Encounter (Signed)
Pt needs lantus rx called in please to the [facility]

## 2014-12-08 ENCOUNTER — Telehealth: Payer: Self-pay | Admitting: Endocrinology

## 2014-12-08 NOTE — Telephone Encounter (Signed)
Patient stated that a form (waiver of premium) was faxed to [place] about two months ago, do you you still have it?  please advise

## 2014-12-09 NOTE — Telephone Encounter (Signed)
All documents needed were faxed.

## 2014-12-10 ENCOUNTER — Other Ambulatory Visit: Payer: Self-pay | Admitting: *Deleted

## 2014-12-10 MED ORDER — SITAGLIP PHOS-METFORMIN HCL ER 100-1000 MG PO TB24: ORAL_TABLET | ORAL | Status: DC

## 2014-12-14 ENCOUNTER — Other Ambulatory Visit (INDEPENDENT_AMBULATORY_CARE_PROVIDER_SITE_OTHER): Payer: Medicare Other

## 2014-12-14 ENCOUNTER — Other Ambulatory Visit: Payer: Self-pay | Admitting: Endocrinology

## 2014-12-14 DIAGNOSIS — E785 Hyperlipidemia, unspecified: Secondary | ICD-10-CM | POA: Diagnosis not present

## 2014-12-14 DIAGNOSIS — E1165 Type 2 diabetes mellitus with hyperglycemia: Secondary | ICD-10-CM

## 2014-12-14 DIAGNOSIS — IMO0002 Reserved for concepts with insufficient information to code with codable children: Secondary | ICD-10-CM

## 2014-12-14 LAB — HEMOGLOBIN A1C: Hgb A1c MFr Bld: 6.8 % — ABNORMAL HIGH (ref 4.6–6.5)

## 2014-12-14 LAB — COMPREHENSIVE METABOLIC PANEL
ALT: 19 U/L (ref 0–35)
AST: 27 U/L (ref 0–37)
Albumin: 3.5 g/dL (ref 3.5–5.2)
Alkaline Phosphatase: 72 U/L (ref 39–117)
BILIRUBIN TOTAL: 0.2 mg/dL (ref 0.2–1.2)
BUN: 16 mg/dL (ref 6–23)
CHLORIDE: 102 meq/L (ref 96–112)
CO2: 28 meq/L (ref 19–32)
CREATININE: 1.31 mg/dL — AB (ref 0.40–1.20)
Calcium: 9 mg/dL (ref 8.4–10.5)
GFR: 55.21 mL/min — ABNORMAL LOW (ref >60.00)
GLUCOSE: 103 mg/dL — AB (ref 70–99)
Potassium: 3.6 mEq/L (ref 3.5–5.1)
SODIUM: 139 meq/L (ref 135–145)
Total Protein: 6 g/dL (ref 6.0–8.3)

## 2014-12-14 LAB — LIPID PANEL
CHOL/HDL RATIO: 2
Cholesterol: 207 mg/dL — ABNORMAL HIGH (ref 0–200)
HDL: 84.1 mg/dL (ref >39.00)
LDL CALC: 85 mg/dL (ref 0–99)
NONHDL: 123.14
Triglycerides: 190 mg/dL — ABNORMAL HIGH (ref 0.0–149.0)
VLDL: 38 mg/dL (ref 0.0–40.0)

## 2014-12-17 ENCOUNTER — Ambulatory Visit: Payer: Medicare Other | Admitting: Endocrinology

## 2014-12-21 DIAGNOSIS — Z0289 Encounter for other administrative examinations: Secondary | ICD-10-CM

## 2014-12-22 ENCOUNTER — Ambulatory Visit (INDEPENDENT_AMBULATORY_CARE_PROVIDER_SITE_OTHER): Payer: Medicare Other | Admitting: Endocrinology

## 2014-12-22 ENCOUNTER — Encounter: Payer: Self-pay | Admitting: Endocrinology

## 2014-12-22 VITALS — BP 146/90 | HR 109 | Temp 98.6°F | Resp 14 | Ht 67.0 in | Wt 253.0 lb

## 2014-12-22 DIAGNOSIS — Z794 Long term (current) use of insulin: Secondary | ICD-10-CM | POA: Diagnosis not present

## 2014-12-22 DIAGNOSIS — I10 Essential (primary) hypertension: Secondary | ICD-10-CM | POA: Diagnosis not present

## 2014-12-22 DIAGNOSIS — E1165 Type 2 diabetes mellitus with hyperglycemia: Secondary | ICD-10-CM | POA: Diagnosis not present

## 2014-12-22 DIAGNOSIS — E785 Hyperlipidemia, unspecified: Secondary | ICD-10-CM

## 2014-12-22 MED ORDER — SITAGLIP PHOS-METFORMIN HCL ER 50-1000 MG PO TB24: ORAL_TABLET | ORAL | Status: DC

## 2014-12-22 NOTE — Progress Notes (Signed)
Patient ID: Lynn [name], female   DOB: 17-Jun-1965, 49 y.o.   MRN: [id number]    Reason for Appointment: Followup for Type 2 Diabetes  Referring physician: Donnie Coffin  History of Present Illness:          Diagnosis: Type 2 diabetes mellitus, date of diagnosis: 2013        Past history: She weighed nearly 300 lb in 2013 around that time her diabetes was diagnosed Although her A1c at that time was reported at 7.4 she does not remember being told about diabetes and no treatment was given In 5/15 when she was getting steroids for her bone marrow transplant her blood sugar was over 400 She was started on insulin and had been taking a basal bolus insulin regimen subsequently while on prednisone Although she required small amounts of insulin she could not taper off insulin even when off prednisone  Recent history:   Meals 10 am INSULIN regimen is described as: Novolog 14-16 units acb and 18 units with dinner, Lantus 10 units in a.m.  She has been on insulin since starting prednisone  She is now taking 10 mg prednisone recently Also now she is continuing to take her Janumet XR which was started back in 7/16   Her A1c is now fairly stable at 6.8, previously 6.5 Blood sugars are controlled now with reduced doses of prednisone and better compliance with mealtime insulin  Current blood sugar patterns and problems:  Her fasting blood sugars have been minimally increased, she has on her own reduced her Lantus from 12 down to 10 units with reducing her prednisone  She is adjusting her Novolog on her own but not clear why she is taking relatively high dose at breakfast which is around 10 AM and she is not eating as much as at dinnertime  Consequently she is getting HYPOGLYCEMIA periodically after her first meal with sugars as low as 46  Blood sugars after evening meal quite variable with a couple of relatively high readings based on her diet  She thinks she is better compliant  with taking her insulin before eating in the evening but not adjusting the dose based on what she is eating    She still has difficulty losing weight with prednisone  She is now trying to do a little exercise with walking       Oral hypoglycemic drugs the patient is taking are: None    Side effects from medications have been: Diarrhea from Metformin over 1000 mg dose  Glucose monitoring:  done less than 1 time a day         Glucometer:  Accu-Chek   Mean values apply above for all meters except median for One Touch  PRE-MEAL Fasting Lunch  afternoon  Bedtime Overall  Glucose range: 94-146   49-241   110-219    Mean/median:  118     148   121      Glycemic control:   Lab Results  Component Value Date   HGBA1C 6.8* 12/14/2014   HGBA1C 6.5 08/10/2014   HGBA1C 8.5* 05/08/2014   Lab Results  Component Value Date   MICROALBUR 1.6 08/10/2014   LDLCALC 85 12/14/2014   CREATININE 1.31* 12/14/2014    Self-care: The diet that the patient has been following is: tries to limit meal size  Meals: 2 meals per day.   eating eggs or cereal with breakfast at 10 am. Supper 5-6 pm  Exercise: walking more recently , limited  by various issues   Dietician visit: Most recent: 5/15 at the hospital              Compliance with the medical regimen: Fair  Weight history:  Wt Readings from Last 3 Encounters:  12/22/14 253 lb (114.76 kg)  10/14/14 248 lb (112.492 kg)  08/13/14 248 lb 3.2 oz (112.583 kg)      Medication List       This list is accurate as of: 12/22/14  9:17 PM.  Always use your most recent med list.               ACCU-CHEK AVIVA PLUS test strip  Generic drug:  glucose blood  USE TO TEST BLOOD GLUCOSE 2 TIMES DAILY     acyclovir 800 MG tablet  Commonly known as:  ZOVIRAX  Take 1 tablet (800 mg total) by mouth 2 (two) times daily. Marland Kitchen     amitriptyline 10 MG tablet  Commonly known as:  ELAVIL  Take 1 tablet (10 mg total) by mouth at bedtime.     cetirizine 10 MG  tablet  Commonly known as:  ZYRTEC  Take 1 tablet (10 mg total) by mouth daily.     Fluticasone-Salmeterol 100-50 MCG/DOSE Aepb  Commonly known as:  ADVAIR  Inhale 1 puff into the lungs 2 (two) times daily.     folic acid A999333 MCG tablet  Commonly known as:  FOLVITE  Take 400 mcg by mouth daily.     gabapentin 300 MG capsule  Commonly known as:  NEURONTIN     GLEEVEC 100 MG tablet  Generic drug:  imatinib  Take 100 mg by mouth daily. Take with meals and large glass of water.Caution:Chemotherapy  Takes 2 tablets once a day     hydrochlorothiazide 12.5 MG capsule  Commonly known as:  MICROZIDE  Take 12.5 mg by mouth daily.     insulin aspart 100 UNIT/ML FlexPen  Commonly known as:  NOVOLOG  Inject 16 units with each meal  Plus 18-20 extra units if sugar is over 250     Insulin Glargine 100 UNIT/ML Solostar Pen  Commonly known as:  LANTUS  Inject 12 units everymore, can use sliding scale     LORazepam 1 MG tablet  Commonly known as:  ATIVAN  Take 1 mg by mouth as needed.     multivitamin tablet  Take 1 tablet by mouth daily.     NOVOFINE PLUS 32G X 4 MM Misc  Generic drug:  Insulin Pen Needle     ondansetron 4 MG disintegrating tablet  Commonly known as:  ZOFRAN ODT  Take 1 tablet (4 mg total) by mouth every 8 (eight) hours as needed for nausea.     potassium chloride SA 20 MEQ tablet  Commonly known as:  K-DUR,KLOR-CON  Take 1 tablet (20 mEq total) by mouth daily.     pravastatin 20 MG tablet  Commonly known as:  PRAVACHOL  Take 1 tablet (20 mg total) by mouth daily.     predniSONE 5 MG tablet  Commonly known as:  DELTASONE  TAKE 1.5 TABLETS (7.5 MG TOTAL) BY MOUTH DAILY.     RESTASIS 0.05 % ophthalmic emulsion  Generic drug:  cycloSPORINE  1 drop.     SINGULAIR 5 MG chewable tablet  Generic drug:  montelukast  Chew 5 mg by mouth at bedtime.     sirolimus 2 MG tablet  Commonly known as:  RAPAMUNE  Take 2 mg by mouth daily.  SitaGLIPtin-MetFORMIN  HCl 50-1000 MG Tb24  Commonly known as:  JANUMET XR  2 tabs qd     SLOW-MAG 64 MG Tbec SR tablet  Generic drug:  magnesium chloride  Take 2 tablets by mouth 3 (three) times daily.     sulfamethoxazole-trimethoprim 800-160 MG tablet  Commonly known as:  BACTRIM DS,SEPTRA DS  Take 1 tablet by mouth 3 (three) times a week. Monday,Wednesday, and Friday     traMADol 50 MG tablet  Commonly known as:  ULTRAM  Take 1 tablet (50 mg total) by mouth every 6 (six) hours as needed for moderate pain.        Allergies:  Allergies  Allergen Reactions  . Other Rash    Please do not use clear tegaderm dressings as patient states they pull her skin off.  Mepilex is fine.    Past Medical History  Diagnosis Date  . Hypertension   . Brain bleed ([place])     07/04/11  . Pulmonary embolism ([place])     06/22/11  . Headache(784.0)   . Arthritis   . Anemia   . ALL (acute lymphoblastic leukemia) ([place])   . Leukemia ([place])   . Diabetes mellitus without complication ([place])   . Pneumonia   . Thrush of mouth and esophagus ([place]) 04/09/2014    Past Surgical History  Procedure Laterality Date  . Cesarean section  2000  . Foot surgery    . Craniotomy  07/04/11  . [place] filter    . Bone marrow transplant    . Lung biopsy  06/19/13  . Eye surgery Right 2005    repair crossed eye    Family History  Problem Relation Age of Onset  . Pancreatic cancer Father   . Cancer Mother     colon  . Pancreatic cancer Mother   . Diabetes Neg Hx   . Heart disease Neg Hx   . Asthma Son   . Hypertension Sister     Social History:  reports that she has never smoked. She has never used smokeless tobacco. She reports that she does not drink alcohol or use illicit drugs.    Review of Systems    Last foot exam was in 05/2014      Lipids: She tends to have high triglycerides and mildly increased LDL which is improved She was started on pravastatin 20 mg daily in 9/16 and she has no side effects or liver dysfunction  with this      Lab Results  Component Value Date   CHOL 207* 12/14/2014   HDL 84.10 12/14/2014   LDLCALC 85 12/14/2014   LDLDIRECT 109.3 01/12/2014   TRIG 190.0* 12/14/2014   CHOLHDL 2 12/14/2014    She is currently taking HCTZ only for hypertension, this is being prescribed by her oncologist previously was on amlodipine for hypertension which may have been causing some edema  She is being followed at [facility] for her various problems including transplant rejection  Her liver functions have been consistently normal recently     Thyroid:   She had a thyroid biopsy in 6/14 for a left-sided thyroid nodule which was benign  Physical Examination:  BP 146/90 mmHg  Pulse 109  Temp(Src) 98.6 F (37 C)  Resp 14  Ht 5\' 7"  (1.702 m)  Wt 253 lb (114.76 kg)  BMI 39.62 kg/m2  SpO2 96%  LMP 08/09/2011   no edema     ASSESSMENT:  Diabetes type 2, uncontrolled  Her blood sugars are  fair with A1c 6.8 See history of present illness for detailed discussion of his current management, blood sugar patterns and problems identified  Fasting blood sugars are reasonably good with only 10 units of Novolog she is still requiring significant amount of mealtime coverage especially at suppertime She is not able to adjust her Novolog on her own and is getting excessive amount for breakfast causing periodic hypoglycemia and sometimes not enough for evening meal some high readings; also occasionally has a high readings in the afternoon after lunch on a weekend Still requiring insulin despite taking only 10 mg prednisone now  Discussed day-to-day management including adjustment of Novolog based on meal size and carbohydrate intake as well as watching 2 hour postprandial readings She needs less insulin for breakfast currently  WEIGHT gain: This is a combination of prednisone, Gleevec and difficulty with exercise ability   HYPERLIPIDEMIA: We will continue her pravastatin as LDL is well controlled  with 20 mg, tolerating this well   HYPERTENSION: Not controlled today, followed and treated by oncologist currently  PLAN:   Emphasized the need to adjust her Novolog as above  She will reduce her morning Novolog to about 10 units  May increase suppertime dose by 2-3 units if eating larger meals are more carbohydrate  Consistent Novolog before meals including if she is eating differently on weekends  Increase exercise as tolerated  Will increase the dose of her METFORMIN in the Janumet for better efficacy and to hopefully taper down her insulin further including Lantus  Continue follow-up with oncologist/PCP for hypertension, not increase her HCTZ since she has low normal potassium levels  Follow-up in 3 months and she will call if she has any issues with her glucose control  Counseling time on subjects discussed above is over 50% of today's 25 minute visit  Patient Instructions  Next Rx for Janumet will be 50/1000, 2 pills at dinner  Check blood sugars on waking up 3  times a week Also check blood sugars about 2 hours after a meal and do this after different meals by rotation  Recommended blood sugar levels on waking up is 90-130 and about 2 hours after meal is 130-160  Please bring your blood sugar monitor to each visit, thank you  Walk as much as possible  Novolog 18-20 at supper, only 10 in am     [facility] 12/22/2014, 9:17 PM   Note: This office note was prepared with Dragon voice recognition system technology. Any transcriptional errors that result from this process are unintentional.

## 2014-12-22 NOTE — Patient Instructions (Addendum)
Next Rx for Janumet will be 50/1000, 2 pills at dinner  Check blood sugars on waking up 3  times a week Also check blood sugars about 2 hours after a meal and do this after different meals by rotation  Recommended blood sugar levels on waking up is 90-130 and about 2 hours after meal is 130-160  Please bring your blood sugar monitor to each visit, thank you  Walk as much as possible  Novolog 18-20 at supper, only 10 in am

## 2014-12-30 ENCOUNTER — Other Ambulatory Visit: Payer: Self-pay | Admitting: Endocrinology

## 2015-01-12 ENCOUNTER — Other Ambulatory Visit: Payer: Self-pay | Admitting: Endocrinology

## 2015-02-03 DIAGNOSIS — R682 Dry mouth, unspecified: Secondary | ICD-10-CM | POA: Diagnosis not present

## 2015-02-03 DIAGNOSIS — H04123 Dry eye syndrome of bilateral lacrimal glands: Secondary | ICD-10-CM | POA: Diagnosis not present

## 2015-02-03 DIAGNOSIS — I10 Essential (primary) hypertension: Secondary | ICD-10-CM | POA: Diagnosis not present

## 2015-02-03 DIAGNOSIS — T380X5A Adverse effect of glucocorticoids and synthetic analogues, initial encounter: Secondary | ICD-10-CM | POA: Diagnosis not present

## 2015-02-03 DIAGNOSIS — D89811 Chronic graft-versus-host disease: Secondary | ICD-10-CM | POA: Diagnosis not present

## 2015-02-03 DIAGNOSIS — C9101 Acute lymphoblastic leukemia, in remission: Secondary | ICD-10-CM | POA: Diagnosis not present

## 2015-02-03 DIAGNOSIS — R739 Hyperglycemia, unspecified: Secondary | ICD-10-CM | POA: Diagnosis not present

## 2015-02-03 DIAGNOSIS — T865 Complications of stem cell transplant: Secondary | ICD-10-CM | POA: Diagnosis not present

## 2015-02-03 DIAGNOSIS — Z794 Long term (current) use of insulin: Secondary | ICD-10-CM | POA: Diagnosis not present

## 2015-02-03 DIAGNOSIS — F4323 Adjustment disorder with mixed anxiety and depressed mood: Secondary | ICD-10-CM | POA: Diagnosis not present

## 2015-02-04 ENCOUNTER — Encounter: Payer: Self-pay | Admitting: Neurology

## 2015-02-04 ENCOUNTER — Ambulatory Visit (INDEPENDENT_AMBULATORY_CARE_PROVIDER_SITE_OTHER): Payer: Medicare Other | Admitting: Neurology

## 2015-02-04 VITALS — BP 136/77 | HR 112 | Ht 67.0 in | Wt 250.0 lb

## 2015-02-04 DIAGNOSIS — R51 Headache with orthostatic component, not elsewhere classified: Secondary | ICD-10-CM

## 2015-02-04 DIAGNOSIS — H539 Unspecified visual disturbance: Secondary | ICD-10-CM

## 2015-02-04 DIAGNOSIS — R112 Nausea with vomiting, unspecified: Secondary | ICD-10-CM | POA: Diagnosis not present

## 2015-02-04 DIAGNOSIS — H5713 Ocular pain, bilateral: Secondary | ICD-10-CM

## 2015-02-04 DIAGNOSIS — R519 Headache, unspecified: Secondary | ICD-10-CM

## 2015-02-04 MED ORDER — SUMATRIPTAN 20 MG/ACT NA SOLN: 20.0000 mg | Freq: Once | NASAL | Status: DC

## 2015-02-04 MED ORDER — DIVALPROEX SODIUM ER 500 MG PO TB24: 500.0000 mg | ORAL_TABLET | Freq: Every day | ORAL | Status: DC

## 2015-02-04 MED ORDER — AMITRIPTYLINE HCL 10 MG PO TABS: 20.0000 mg | ORAL_TABLET | Freq: Every day | ORAL | Status: DC

## 2015-02-04 NOTE — Progress Notes (Signed)
[facility]    Provider:  Dr Jaynee Eagles Referring Provider: Alroy Dust, L.Marlou Sa, MD Primary Care Physician:  Donnie Coffin, MD  CC: Headache  HPI: Lynn [name] is a 50 y.o. female here as a follow up. She is a former patient of Dr. Leonie Man and is transitioning to me. She has chronic daily headaches since June 2013 following intracerebral hemorrhage and treatment with chemotherapy for leukemia. She is still on Amitriptyline which works well. She is having shooting pain on the left(points to the occipital area). Happens a few days a week. Sharp, brief, throbbing, lightning bolt. It doesn't happen all day. It is a few times a week. Sesitive to the touch. She has tenderness a few hours a day in another spot where the drain was, feels like pressure, sore and tender to the touch on the right frontal area behind the hairline. The pain started this winter. 3-4 times a week. She has taken ibuprofen and it doesn't really help.   Interval history 02/04/2015: Headaches. She is having at 4 a week. They can last at least several hours . Lorazepam helps. Starts on the left, a dull pain, then spreads to the front, has nausea, no vomiting, here eyes are really dry and this is exacerbating the headaches, she has been to ophthalmology for her eyes. She is on restasis drops. Light is bothering her since her eyes are dry. She was doing well but within the last few monthe the headaches have returned. She prefers to try a new medication instead of increasing Amitriptyline. She is feeling Depressed and having difficulty sleeping due to her medical conditions. She just labs completed and liver function was normal but creatinine is still elevated. Worse with laying down.   Dr. Leonie Man and Charlott Holler 09/15/2013: She returns for followup after her initial consultation with me on 04/09/12. She did not tolerate Topamax as it did not help and hence stopped it after a few weeks. She in fact develop worsening headache and was  hospitalized at [facility] with headache and fever he had Topamax was discontinued and she was started on amitriptyline 10 mg at night. She was also given Phenergan for nausea seems to be working quite well. She also states that the chemotherapy dose has been reduced and that may have helped as the headaches have practically disappeared. She hasn't had headache only one day in the last 4 weeks. She did discontinue the Imitrex , oxycodone and Fioricet. 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 Lab work done on 04/09/12 showed normal ESR, ANA panel, B12, blood chemistries. TSH was slightly suppressed at 0.452miu/ml.   [name] for headache revisit, states her headaches have been manageable, except she had one severe headache in July with nausea, vomiting and diarrhea, tested positive for cdiff. Feeling better now. Tolerating Elavil at night. Has new complaint os tinging in medial thighs when she puts chin to chest. No associated pain or numbness.  09/12/2012 (PS): She returns for followup after last visit on 06/06/12. She states she is doing well and has only occasional minor headaches off and on. She does take Imitrex which seems to help. She uses only once every 2 weeks or so. She continues to take amitriptyline 10 mg at night and seems to tolerate it well. She has had trouble sleeping in recent weeks. She also has some minor  sinus headaches her once a week which are not disabling.   12/13/12 (LL): [name] returns for 3 month revisit for headaches. She was recently hospitalized from 10/21/12 to 11/29/12 at [facility] for relapse of leukemia. She was found in hospital to have thin subdural hematomas along the falx, tentorium and cerebral convexities with a small volume of subarachnoid hemorrhage along the sulci of the temporal and frontal lobes. She had  another round of chemotherapy and allograft bone marrow transplant; her blood counts are improving. She is not have many headaches, sometimes only sharp pain that is momentary. She is tolerating Elavil well but did not increase to 10 mg for fear of drowsiness. She takes Imitrex infrequently, but states it helps when she has to take it.    Social History   Social History  . Marital Status: Divorced    Spouse Name: N/A  . Number of Children: 2  . Years of Education: Associates   Occupational History  . N/A    Social History Main Topics  . Smoking status: Never Smoker   . Smokeless tobacco: Never Used  . Alcohol Use: No  . Drug Use: No  . Sexual Activity: Not Currently    Birth Control/ Protection: Abstinence, None   Other Topics Concern  . Not on file   Social History Narrative   Pt lives with her 2 children.    Associates degree   Caffeine Use: 8oz coffee/day     Family History  Problem Relation Age of Onset  . Pancreatic cancer Father   . Cancer Mother     colon  . Pancreatic cancer Mother   . Diabetes Neg Hx   . Heart disease Neg Hx   . Asthma Son   . Hypertension Sister     Past Medical History  Diagnosis Date  . Hypertension   . Brain bleed ([place])     07/04/11  . Pulmonary embolism ([place])     06/22/11  . Headache(784.0)   . Arthritis   . Anemia   . ALL (acute lymphoblastic leukemia) ([place])   . Leukemia ([place])   . Diabetes mellitus without complication ([place])   . Pneumonia   . Thrush of mouth and esophagus ([place]) 04/09/2014    Past Surgical History  Procedure Laterality Date  . Cesarean section  2000  . Foot surgery    . Craniotomy  07/04/11  . [place] filter    . Bone marrow transplant    . Lung biopsy  06/19/13  . Eye surgery Right 2005    repair crossed eye    Current Outpatient Prescriptions  Medication Sig Dispense Refill  . ACCU-CHEK AVIVA PLUS test strip USE TO TEST BLOOD GLUCOSE 2 TIMES DAILY  5  . acyclovir (ZOVIRAX) 800 MG tablet Take 1  tablet (800 mg total) by mouth 2 (two) times daily. . 60 tablet 5  . ADVAIR DISKUS 250-50 MCG/DOSE AEPB INHALE 1 PUFF INTO THE LUNGS EVERY 12 HOURS  5  . amitriptyline (ELAVIL) 10 MG tablet Take 1 tablet (10 mg total) by mouth at bedtime. 90 tablet 3  . cetirizine (ZYRTEC) 10 MG tablet Take 1 tablet (10 mg total) by mouth daily. (Patient taking differently: Take 10 mg by mouth daily as needed. ) 30 tablet 0  . cycloSPORINE (RESTASIS) 0.05 % ophthalmic emulsion 1 drop.    Marland Kitchen Fluticasone-Salmeterol (ADVAIR) 100-50 MCG/DOSE AEPB Inhale 1 puff into the lungs 2 (two) times daily.    . folic acid (FOLVITE) 485 MCG tablet  Take 400 mcg by mouth daily.    Marland Kitchen gabapentin (NEURONTIN) 300 MG capsule     . hydrochlorothiazide (MICROZIDE) 12.5 MG capsule Take 12.5 mg by mouth daily.    Marland Kitchen imatinib (GLEEVEC) 100 MG tablet Take 200 mg by mouth daily. Take with meals and large glass of water.Caution:Chemotherapy  Takes 2 tablets once a day    . insulin aspart (NOVOLOG) 100 UNIT/ML FlexPen Inject 16 units with each meal  Plus 18-20 extra units if sugar is over 250 30 mL 3  . Insulin Glargine (LANTUS) 100 UNIT/ML Solostar Pen Inject 12 units everymore, can use sliding scale 15 mL 3  . JANUMET XR [phone] MG TB24 TAKE 1 TABLET AT SUPPER  3  . LORazepam (ATIVAN) 1 MG tablet Take 1 mg by mouth as needed.     . magnesium chloride (SLOW-MAG) 64 MG TBEC SR tablet Take 2 tablets by mouth 3 (three) times daily.     . montelukast (SINGULAIR) 5 MG chewable tablet Chew 5 mg by mouth at bedtime.    . Multiple Vitamin (MULTIVITAMIN) tablet Take 1 tablet by mouth daily.    Marland Kitchen NOVOFINE PLUS 32G X 4 MM MISC     . ondansetron (ZOFRAN ODT) 4 MG disintegrating tablet Take 1 tablet (4 mg total) by mouth every 8 (eight) hours as needed for nausea. 30 tablet 1  . potassium chloride SA (K-DUR,KLOR-CON) 20 MEQ tablet Take 1 tablet (20 mEq total) by mouth daily. 30 tablet 3  . pravastatin (PRAVACHOL) 20 MG tablet TAKE 1 TABLET BY MOUTH DAILY.  30 tablet 2  . predniSONE (DELTASONE) 5 MG tablet TAKE 1.5 TABLETS (7.5 MG TOTAL) BY MOUTH DAILY.  2  . prochlorperazine (COMPAZINE) 5 MG tablet Take 5 mg by mouth as needed.    . Sirolimus 0.5 MG TABS Take 0.5 mg by mouth daily.  2  . SitaGLIPtin-MetFORMIN HCl (JANUMET XR) 50-1000 MG TB24 2 tabs qd 60 tablet 3  . sulfamethoxazole-trimethoprim (BACTRIM DS,SEPTRA DS) 800-160 MG per tablet Take 1 tablet by mouth 3 (three) times a week. Monday,Wednesday, and Friday 30 tablet 0  . traMADol (ULTRAM) 50 MG tablet Take 1 tablet (50 mg total) by mouth every 6 (six) hours as needed for moderate pain. 30 tablet 0   No current facility-administered medications for this visit.    Allergies as of 02/04/2015 - Review Complete 02/04/2015  Allergen Reaction Noted  . Other Rash 05/28/2013    Vitals: BP 136/77 mmHg  Pulse 112  Ht _0  (1.702 m)  Wt 250 lb (113.399 kg)  BMI 39.15 kg/m2  LMP 08/09/2011 Last Weight:  Wt Readings from Last 1 Encounters:  02/04/15 250 lb (113.399 kg)   Last Height:   Ht Readings from Last 1 Encounters:  02/04/15 _1  (1.702 m)    Speech:  Speech is normal; fluent and spontaneous with normal comprehension.  Cognition:  The patient is oriented to person, place, and time;   recent and remote memory intact;   language fluent;   normal attention, concentration,   fund of knowledge Cranial Nerves:  The pupils are equal, round, and reactive to light. The fundi are normal and spontaneous venous pulsations are present. Visual fields are full to finger confrontation. Extraocular movements are intact. Trigeminal sensation is intact and the muscles of mastication are normal. The face is symmetric. The palate elevates in the midline. Hearing intact. Voice is normal. Shoulder shrug is normal. The tongue has normal motion without fasciculations.   Motor Observation:  No asymmetry, no atrophy, and no involuntary movements noted. Tone:  Normal muscle  tone.   Posture:  Posture is normal. normal erect   Strength:  Strength is V/V in the upper and lower limbs.    Assessment/Plan: 50 year old lady with history of ALL with chronic headaches since June 2013 which seems to have improved with Amitriptyline now exacerbated and increasing. Exact etiology remains unclear but likely mixed transformed migraine and tension headaches with possible contribution from chemotherapy. ALL again in remission.  PLAN:  Continue amitriptyline 20 mg at night for headache prophylaxis since it seems to be tolerated and working well. Continue to use imitrex spray  for headache and Compazine as needed for nausea. Will also start Depakote for further migraine prophylaxis and the neuralgia she is expriencing in the left occipital and right frontal areas. MRI of the brain.  Return for followup in 4-6 months or call earlier if necessary.     Sarina Ill, MD  [facility] [address] [place], [place] [address]  Phone [phone] Fax [phone]  A total of 40 minutes was spent face-to-face with this patient. Over half this time was spent on counseling patient on the migraine diagnosis and different diagnostic and therapeutic options available.

## 2015-02-04 NOTE — Patient Instructions (Signed)
Overall you are doing fairly well but I do want to suggest a few things today:   Remember to drink plenty of fluid, eat healthy meals and do not skip any meals. Try to eat protein with a every meal and eat a healthy snack such as fruit or nuts in between meals. Try to keep a regular sleep-wake schedule and try to exercise daily, particularly in the form of walking, 20-30 minutes a day, if you can.   As far as your medications are concerned, I would like to suggest: Increase amitriptyline, Start Depakote, imitrex nasal spray at onset of migraine.  As far as diagnostic testing:   I would like to see you back in 3 months, sooner if we need to. Please call us with any interim questions, concerns, problems, updates or refill requests.   Please also call us for any test results so we can go over those with you on the phone.  My clinical assistant and will answer any of your questions and relay your messages to me and also relay most of my messages to you.   Our phone number is [phone]. We also have an after hours call service for urgent matters and there is a physician on-call for urgent questions. For any emergencies you know to call 911 or go to the nearest emergency room

## 2015-02-10 ENCOUNTER — Ambulatory Visit
Admission: RE | Admit: 2015-02-10 | Discharge: 2015-02-10 | Disposition: A | Discharge status: Home / Self Care | Payer: Medicare Other | Source: Ambulatory Visit | Attending: Neurology | Admitting: Neurology

## 2015-02-10 DIAGNOSIS — R51 Headache with orthostatic component, not elsewhere classified: Secondary | ICD-10-CM

## 2015-02-10 DIAGNOSIS — R112 Nausea with vomiting, unspecified: Secondary | ICD-10-CM

## 2015-02-10 DIAGNOSIS — H539 Unspecified visual disturbance: Secondary | ICD-10-CM

## 2015-02-10 DIAGNOSIS — H5713 Ocular pain, bilateral: Secondary | ICD-10-CM

## 2015-02-10 DIAGNOSIS — R519 Headache, unspecified: Secondary | ICD-10-CM

## 2015-02-12 ENCOUNTER — Telehealth: Payer: Self-pay

## 2015-02-12 NOTE — Telephone Encounter (Signed)
-----   Message from Melvenia Beam, MD sent at 02/12/2015 12:59 PM EST ----- Unremarkable MRI of the brain. We see the evidence of her right frontal shunt but nothing new, No changes from 2014. thanks

## 2015-02-12 NOTE — Telephone Encounter (Signed)
Spoke to pt and advised her that her MRI brain was unremarkable, there is evidence for the shunt, but nothing new and no changes from 2014. Pt verbalized understanding and had no questions at this time.

## 2015-02-16 ENCOUNTER — Other Ambulatory Visit: Payer: Self-pay | Admitting: Endocrinology

## 2015-02-22 DIAGNOSIS — D89811 Chronic graft-versus-host disease: Secondary | ICD-10-CM | POA: Diagnosis not present

## 2015-02-22 DIAGNOSIS — H04123 Dry eye syndrome of bilateral lacrimal glands: Secondary | ICD-10-CM | POA: Diagnosis not present

## 2015-03-01 ENCOUNTER — Telehealth: Payer: Self-pay | Admitting: Endocrinology

## 2015-03-01 NOTE — Telephone Encounter (Signed)
Please see below and advise.

## 2015-03-01 NOTE — Telephone Encounter (Signed)
Start reducing Janumet to once a day  after the main meal until next visit

## 2015-03-01 NOTE — Telephone Encounter (Signed)
Noted patient is aware 

## 2015-03-01 NOTE — Telephone Encounter (Signed)
Patient think the janumet medication is making her sick she is getting nauseated with no appetite, please advise

## 2015-03-03 DIAGNOSIS — D89811 Chronic graft-versus-host disease: Secondary | ICD-10-CM | POA: Diagnosis not present

## 2015-03-03 DIAGNOSIS — J84116 Cryptogenic organizing pneumonia: Secondary | ICD-10-CM | POA: Diagnosis not present

## 2015-03-03 DIAGNOSIS — T380X5A Adverse effect of glucocorticoids and synthetic analogues, initial encounter: Secondary | ICD-10-CM | POA: Diagnosis not present

## 2015-03-03 DIAGNOSIS — I10 Essential (primary) hypertension: Secondary | ICD-10-CM | POA: Diagnosis not present

## 2015-03-03 DIAGNOSIS — D89813 Graft-versus-host disease, unspecified: Secondary | ICD-10-CM | POA: Diagnosis not present

## 2015-03-03 DIAGNOSIS — C9101 Acute lymphoblastic leukemia, in remission: Secondary | ICD-10-CM | POA: Diagnosis not present

## 2015-03-03 DIAGNOSIS — Z9484 Stem cells transplant status: Secondary | ICD-10-CM | POA: Diagnosis not present

## 2015-03-03 DIAGNOSIS — F4323 Adjustment disorder with mixed anxiety and depressed mood: Secondary | ICD-10-CM | POA: Diagnosis not present

## 2015-03-03 DIAGNOSIS — R682 Dry mouth, unspecified: Secondary | ICD-10-CM | POA: Diagnosis not present

## 2015-03-09 DIAGNOSIS — D89811 Chronic graft-versus-host disease: Secondary | ICD-10-CM | POA: Diagnosis not present

## 2015-03-09 DIAGNOSIS — Z9484 Stem cells transplant status: Secondary | ICD-10-CM | POA: Diagnosis not present

## 2015-03-09 DIAGNOSIS — C9101 Acute lymphoblastic leukemia, in remission: Secondary | ICD-10-CM | POA: Diagnosis not present

## 2015-03-19 ENCOUNTER — Other Ambulatory Visit (INDEPENDENT_AMBULATORY_CARE_PROVIDER_SITE_OTHER): Payer: Medicare Other

## 2015-03-19 DIAGNOSIS — Z794 Long term (current) use of insulin: Secondary | ICD-10-CM | POA: Diagnosis not present

## 2015-03-19 DIAGNOSIS — E1165 Type 2 diabetes mellitus with hyperglycemia: Secondary | ICD-10-CM

## 2015-03-19 LAB — HEMOGLOBIN A1C: HEMOGLOBIN A1C: 6.6 % — AB (ref 4.6–6.5)

## 2015-03-19 LAB — COMPREHENSIVE METABOLIC PANEL
ALBUMIN: 3.6 g/dL (ref 3.5–5.2)
ALK PHOS: 75 U/L (ref 39–117)
ALT: 22 U/L (ref 0–35)
AST: 31 U/L (ref 0–37)
BUN: 12 mg/dL (ref 6–23)
CALCIUM: 8.7 mg/dL (ref 8.4–10.5)
CO2: 29 mEq/L (ref 19–32)
Chloride: 100 mEq/L (ref 96–112)
Creatinine, Ser: 1.47 mg/dL — ABNORMAL HIGH (ref 0.40–1.20)
GFR: 48.29 mL/min — AB (ref >60.00)
Glucose, Bld: 106 mg/dL — ABNORMAL HIGH (ref 70–99)
POTASSIUM: 3.5 meq/L (ref 3.5–5.1)
Sodium: 136 mEq/L (ref 135–145)
TOTAL PROTEIN: 6.2 g/dL (ref 6.0–8.3)
Total Bilirubin: 0.3 mg/dL (ref 0.2–1.2)

## 2015-03-23 DIAGNOSIS — I10 Essential (primary) hypertension: Secondary | ICD-10-CM | POA: Diagnosis not present

## 2015-03-23 DIAGNOSIS — C9101 Acute lymphoblastic leukemia, in remission: Secondary | ICD-10-CM | POA: Diagnosis not present

## 2015-03-23 DIAGNOSIS — D89811 Chronic graft-versus-host disease: Secondary | ICD-10-CM | POA: Diagnosis not present

## 2015-03-23 DIAGNOSIS — Z794 Long term (current) use of insulin: Secondary | ICD-10-CM | POA: Diagnosis not present

## 2015-03-23 DIAGNOSIS — Z9484 Stem cells transplant status: Secondary | ICD-10-CM | POA: Diagnosis not present

## 2015-03-23 DIAGNOSIS — T380X5A Adverse effect of glucocorticoids and synthetic analogues, initial encounter: Secondary | ICD-10-CM | POA: Diagnosis not present

## 2015-03-23 DIAGNOSIS — R682 Dry mouth, unspecified: Secondary | ICD-10-CM | POA: Diagnosis not present

## 2015-03-23 DIAGNOSIS — E118 Type 2 diabetes mellitus with unspecified complications: Secondary | ICD-10-CM | POA: Diagnosis not present

## 2015-03-23 DIAGNOSIS — D89813 Graft-versus-host disease, unspecified: Secondary | ICD-10-CM | POA: Diagnosis not present

## 2015-03-23 DIAGNOSIS — C9100 Acute lymphoblastic leukemia not having achieved remission: Secondary | ICD-10-CM | POA: Diagnosis not present

## 2015-03-24 ENCOUNTER — Ambulatory Visit (INDEPENDENT_AMBULATORY_CARE_PROVIDER_SITE_OTHER): Payer: Medicare Other | Admitting: Endocrinology

## 2015-03-24 ENCOUNTER — Encounter: Payer: Self-pay | Admitting: Endocrinology

## 2015-03-24 VITALS — BP 132/84 | HR 95 | Temp 98.2°F | Resp 16 | Ht 67.0 in | Wt 243.0 lb

## 2015-03-24 DIAGNOSIS — I10 Essential (primary) hypertension: Secondary | ICD-10-CM | POA: Diagnosis not present

## 2015-03-24 DIAGNOSIS — Z794 Long term (current) use of insulin: Secondary | ICD-10-CM

## 2015-03-24 DIAGNOSIS — E1165 Type 2 diabetes mellitus with hyperglycemia: Secondary | ICD-10-CM | POA: Diagnosis not present

## 2015-03-24 DIAGNOSIS — N289 Disorder of kidney and ureter, unspecified: Secondary | ICD-10-CM | POA: Diagnosis not present

## 2015-03-24 NOTE — Progress Notes (Signed)
Patient ID: Lynn [name], female   DOB: 06-12-1965, 50 y.o.   MRN: [id number]    Reason for Appointment: Followup for Type 2 Diabetes  Referring physician: Donnie Coffin  History of Present Illness:          Diagnosis: Type 2 diabetes mellitus, date of diagnosis: 2013        Past history: She weighed nearly 300 lb in 2013 around that time her diabetes was diagnosed Although her A1c at that time was reported at 7.4 she does not remember being told about diabetes and no treatment was given In 5/15 when she was getting steroids for her bone marrow transplant her blood sugar was over 400 She was started on insulin and had been taking a basal bolus insulin regimen subsequently while on prednisone Although she required small amounts of insulin she could not taper off insulin even when off prednisone  Recent history:    INSULIN regimen is described as: Novolog 16 units acb and 16 units with dinner, Lantus 10 units in a.m.  She has been on insulin since she has been taking prednisone  She is now taking 20 mg prednisone recently Also now she is taking 2 tablets of Janumet XR, initially taking 1 tablet  Her A1c is now improved at 6.6  Current blood sugar patterns and problems:  Her fasting blood sugars have not been checked despite reminders.  Lab glucose was 103  No overnight hypoglycemia with Lantus  She has been consistently told to reduce her Novolog at breakfast and increase the dose at suppertime but is taking arbitrarily 16 units twice a day  She has had more nausea and variable appetite more recently and sometimes vomiting  With decreased intake she has had periodic hypoglycemia both midday and afternoon as well as after supper  She has only rarely checked her blood sugar except when she feels hypoglycemic  No readings after supper  Her breakfast can be variable ranging from a fruit up to a bagel and her evening meals are quite variable ranging from vegetables  to pasta  She is now trying to do more exercise with walking       Oral hypoglycemic drugs the patient is taking are:     Side effects from medications have been: Diarrhea from Metformin over 1000 mg dose  Glucose monitoring:  done less than 1 time a day         Glucometer:  Accu-Chek   Mean values apply above for all meters except median for One Touch  PRE-MEAL Fasting Lunch Dinner Bedtime Overall  Glucose range: ?     50-276     Mean/median:    102   83   POST-MEAL PC Breakfast PC Lunch PC Dinner  Glucose range:  45, 61    47, 51   Mean/median:        Glycemic control:   Lab Results  Component Value Date   HGBA1C 6.6* 03/19/2015   HGBA1C 6.8* 12/14/2014   HGBA1C 6.5 08/10/2014   Lab Results  Component Value Date   MICROALBUR 1.6 08/10/2014   LDLCALC 85 12/14/2014   CREATININE 1.47* 03/19/2015    Self-care: The diet that the patient has been following is: tries to limit meal size  Meals: 2 meals per day.  with breakfast at 11 am. Supper 5 pm   Exercise: walking more recently, 30+ min Dietician visit: Most recent: 5/15 at the hospital  Compliance with the medical regimen: Fair  Weight history:  Wt Readings from Last 3 Encounters:  03/24/15 243 lb (110.224 kg)  02/04/15 250 lb (113.399 kg)  12/22/14 253 lb (114.76 kg)      Medication List       This list is accurate as of: 03/24/15 10:33 AM.  Always use your most recent med list.               ACCU-CHEK AVIVA PLUS test strip  Generic drug:  glucose blood  USE TO TEST BLOOD GLUCOSE 2 TIMES DAILY     acyclovir 800 MG tablet  Commonly known as:  ZOVIRAX  Take 1 tablet (800 mg total) by mouth 2 (two) times daily. Marland Kitchen     ADVAIR DISKUS 250-50 MCG/DOSE Aepb  Generic drug:  Fluticasone-Salmeterol  INHALE 1 PUFF INTO THE LUNGS EVERY 12 HOURS     amitriptyline 10 MG tablet  Commonly known as:  ELAVIL  Take 2 tablets (20 mg total) by mouth at bedtime.     cetirizine 10 MG tablet  Commonly  known as:  ZYRTEC  Take 1 tablet (10 mg total) by mouth daily.     divalproex 500 MG 24 hr tablet  Commonly known as:  DEPAKOTE ER  Take 1 tablet (500 mg total) by mouth at bedtime.     folic acid A999333 MCG tablet  Commonly known as:  FOLVITE  Take 400 mcg by mouth daily.     gabapentin 300 MG capsule  Commonly known as:  NEURONTIN     GLEEVEC 100 MG tablet  Generic drug:  imatinib  Take 200 mg by mouth daily. Take with meals and large glass of water.Caution:Chemotherapy  Takes 2 tablets once a day     hydrochlorothiazide 12.5 MG capsule  Commonly known as:  MICROZIDE  Take 12.5 mg by mouth daily.     insulin aspart 100 UNIT/ML FlexPen  Commonly known as:  NOVOLOG  Inject 16 units with each meal  Plus 18-20 extra units if sugar is over 250     Insulin Glargine 100 UNIT/ML Solostar Pen  Commonly known as:  LANTUS  Inject 12 units everymore, can use sliding scale     LORazepam 1 MG tablet  Commonly known as:  ATIVAN  Take 1 mg by mouth as needed.     multivitamin tablet  Take 1 tablet by mouth daily.     NOVOFINE PLUS 32G X 4 MM Misc  Generic drug:  Insulin Pen Needle     ondansetron 4 MG disintegrating tablet  Commonly known as:  ZOFRAN ODT  Take 1 tablet (4 mg total) by mouth every 8 (eight) hours as needed for nausea.     potassium chloride SA 20 MEQ tablet  Commonly known as:  K-DUR,KLOR-CON  TAKE 1 TABLET (20 MEQ TOTAL) BY MOUTH DAILY.     pravastatin 20 MG tablet  Commonly known as:  PRAVACHOL  TAKE 1 TABLET BY MOUTH DAILY.     predniSONE 5 MG tablet  Commonly known as:  DELTASONE  takes 20 mg daily     prochlorperazine 5 MG tablet  Commonly known as:  COMPAZINE  Take 5 mg by mouth as needed.     SINGULAIR 5 MG chewable tablet  Generic drug:  montelukast  Chew 5 mg by mouth at bedtime.     Sirolimus 0.5 MG Tabs  Take 0.5 mg by mouth daily.     SitaGLIPtin-MetFORMIN HCl 50-1000 MG Tb24  Commonly known  as:  JANUMET XR  2 tabs qd     SLOW-MAG 64  MG Tbec SR tablet  Generic drug:  magnesium chloride  Take 2 tablets by mouth 3 (three) times daily.     sulfamethoxazole-trimethoprim 800-160 MG tablet  Commonly known as:  BACTRIM DS,SEPTRA DS  Take 1 tablet by mouth 3 (three) times a week. Monday,Wednesday, and Friday     SUMAtriptan 20 MG/ACT nasal spray  Commonly known as:  IMITREX  Place 1 spray (20 mg total) into the nose once. May repeat in 2 hours if headache persists or recurs.     traMADol 50 MG tablet  Commonly known as:  ULTRAM  Take 1 tablet (50 mg total) by mouth every 6 (six) hours as needed for moderate pain.        Allergies:  Allergies  Allergen Reactions  . Other Rash    Please do not use clear tegaderm dressings as patient states they pull her skin off.  Mepilex is fine.    Past Medical History  Diagnosis Date  . Hypertension   . Brain bleed ([place])     07/04/11  . Pulmonary embolism ([place])     06/22/11  . Headache(784.0)   . Arthritis   . Anemia   . ALL (acute lymphoblastic leukemia) ([place])   . Leukemia ([place])   . Diabetes mellitus without complication ([place])   . Pneumonia   . Thrush of mouth and esophagus ([place]) 04/09/2014    Past Surgical History  Procedure Laterality Date  . Cesarean section  2000  . Foot surgery    . Craniotomy  07/04/11  . [place] filter    . Bone marrow transplant    . Lung biopsy  06/19/13  . Eye surgery Right 2005    repair crossed eye    Family History  Problem Relation Age of Onset  . Pancreatic cancer Father   . Cancer Mother     colon  . Pancreatic cancer Mother   . Diabetes Neg Hx   . Heart disease Neg Hx   . Asthma Son   . Hypertension Sister     Social History:  reports that she has never smoked. She has never used smokeless tobacco. She reports that she does not drink alcohol or use illicit drugs.    Review of Systems    Last foot exam was in 05/2014      Lipids: She tends to have high triglycerides and mildly increased LDL which is improved, now  below 100 She was started on pravastatin 20 mg daily in 9/16      Lab Results  Component Value Date   CHOL 207* 12/14/2014   HDL 84.10 12/14/2014   LDLCALC 85 12/14/2014   LDLDIRECT 109.3 01/12/2014   TRIG 190.0* 12/14/2014   CHOLHDL 2 12/14/2014    She is currently taking HCTZ for hypertension, this is being prescribed by her oncologist   She is being [facility] at [facility] for her various problems including transplant rejection  Her liver functions have been consistently normal recently     Thyroid:   She had a thyroid biopsy in 6/14 for a left-sided thyroid nodule which was benign  Physical Examination:  BP 132/84 mmHg  Pulse 95  Temp(Src) 98.2 F (36.8 C)  Resp 16  Ht 5\' 7"  (1.702 m)  Wt 243 lb (110.224 kg)  BMI 38.05 kg/m2  SpO2 99%  LMP 08/09/2011      ASSESSMENT:  Diabetes type 2, uncontrolled  See history of present illness for detailed discussion of his current management, blood sugar patterns and problems identified She is starting to get hypoglycemic after meals now with her appetite being decreased and having significant problems with nausea and diarrhea is also weight loss secondary to this This is despite taking 20 mg prednisone Probably benefiting from starting Janumet maximum dose also  She is not checking her blood sugars consistently to help identify blood sugar patterns and need for insulin at various meals Discussed importance of checking blood sugars on a routine basis Also most likely needs less insulin at breakfast compared to suppertime Fasting blood sugars are not checked but was normal in the lab  She is doing a little better with exercise and encouraged her to continue  History of hyperlipidemia: Last LDL normal  Renal dysfunction: Relatively mild now   PLAN:   Emphasized the need to check blood sugars as discussed above  For now will stop her Novolog completely to evaluate her blood sugar patterns with taking Lantus and Janumet  only  Her blood sugars are above the discussed targets we will have her start smaller doses of Novolog, starting with 6 at breakfast and 10 at suppertime  Also she can try and take the insulin right after eating if she does not know how much she may actually eat  She no change in Lantus as yet  Start taking some readings at waking up time also  Continue to follow renal function as this may affect her dosage of Janumet  Balanced meals with some protein at each meal, discussed that her insulin doses will depend on total amount of carbohydrate also  Counseling time on subjects discussed above is over 50% of today's 25 minute visit  Patient Instructions  Stop Novolog  If sugars are >180 at meals start 6 Novolog 9 in am and 10 pm  Check blood sugars on waking up 2 times a week Also check blood sugars about 2 hours after a meal and do this after different meals by rotation  Recommended blood sugar levels on waking up is 90-130 and about 2 hours after meal is 130-160  Please bring your blood sugar monitor to each visit, thank you     [facility] 03/24/2015, 10:33 AM   Note: This office note was prepared with Dragon voice recognition system technology. Any transcriptional errors that result from this process are unintentional.

## 2015-03-24 NOTE — Patient Instructions (Signed)
Stop Novolog  If sugars are >180 at meals start 6 Novolog 9 in am and 10 pm  Check blood sugars on waking up 2 times a week Also check blood sugars about 2 hours after a meal and do this after different meals by rotation  Recommended blood sugar levels on waking up is 90-130 and about 2 hours after meal is 130-160  Please bring your blood sugar monitor to each visit, thank you

## 2015-03-29 DIAGNOSIS — D89811 Chronic graft-versus-host disease: Secondary | ICD-10-CM | POA: Diagnosis not present

## 2015-03-29 DIAGNOSIS — H04123 Dry eye syndrome of bilateral lacrimal glands: Secondary | ICD-10-CM | POA: Diagnosis not present

## 2015-04-16 ENCOUNTER — Other Ambulatory Visit (HOSPITAL_COMMUNITY)
Admission: RE | Admit: 2015-04-16 | Discharge: 2015-04-16 | Disposition: A | Discharge status: Home / Self Care | Payer: Medicare Other | Source: Ambulatory Visit | Attending: Family Medicine | Admitting: Family Medicine

## 2015-04-16 ENCOUNTER — Encounter: Payer: Self-pay | Admitting: Family Medicine

## 2015-04-16 ENCOUNTER — Other Ambulatory Visit: Payer: Self-pay | Admitting: Family Medicine

## 2015-04-16 ENCOUNTER — Ambulatory Visit (INDEPENDENT_AMBULATORY_CARE_PROVIDER_SITE_OTHER): Payer: Medicare Other | Admitting: Family Medicine

## 2015-04-16 VITALS — BP 108/71 | HR 118 | Temp 97.5°F | Wt 237.2 lb

## 2015-04-16 DIAGNOSIS — Z124 Encounter for screening for malignant neoplasm of cervix: Secondary | ICD-10-CM

## 2015-04-16 DIAGNOSIS — Z1231 Encounter for screening mammogram for malignant neoplasm of breast: Secondary | ICD-10-CM

## 2015-04-16 DIAGNOSIS — Z01419 Encounter for gynecological examination (general) (routine) without abnormal findings: Secondary | ICD-10-CM | POA: Diagnosis not present

## 2015-04-16 DIAGNOSIS — Z1239 Encounter for other screening for malignant neoplasm of breast: Secondary | ICD-10-CM

## 2015-04-16 DIAGNOSIS — Z1151 Encounter for screening for human papillomavirus (HPV): Secondary | ICD-10-CM | POA: Diagnosis not present

## 2015-04-16 NOTE — Progress Notes (Signed)
  Subjective:     Lynn [name] is a 50 y.o. female and is here for a comprehensive physical exam. The patient reports problems - trying to get over recent virus.  Social History   Social History  . Marital Status: Divorced    Spouse Name: N/A  . Number of Children: 2  . Years of Education: Associates   Occupational History  . N/A    Social History Main Topics  . Smoking status: Never Smoker   . Smokeless tobacco: Never Used  . Alcohol Use: No  . Drug Use: No  . Sexual Activity: Not Currently    Birth Control/ Protection: Abstinence, None   Other Topics Concern  . Not on file   Social History Narrative   Pt lives with her 2 children.    Associates degree   Caffeine Use: 8oz coffee/day    Health Maintenance  Topic Date Due  . PNEUMOCOCCAL POLYSACCHARIDE VACCINE (1) 09/12/1966  . OPHTHALMOLOGY EXAM  09/12/1974  . HIV Screening  09/12/1979  . TETANUS/TDAP  09/12/1983  . PAP SMEAR  09/11/1985  . MAMMOGRAM  09/12/2014  . COLONOSCOPY  09/12/2014  . FOOT EXAM  06/10/2015  . URINE MICROALBUMIN  08/10/2015  . INFLUENZA VACCINE  08/31/2015  . HEMOGLOBIN A1C  09/16/2015    The following portions of the patient's history were reviewed and updated as appropriate: allergies, current medications, past family history, past medical history, past social history, past surgical history and problem list.  Review of Systems Pertinent items noted in HPI and remainder of comprehensive ROS otherwise negative.   Objective:    BP 108/71 mmHg  Pulse 118  Temp(Src) 97.5 F (36.4 C)  Wt 237 lb 3.2 oz (107.593 kg)  LMP 08/09/2011 General appearance: alert, cooperative and appears stated age Head: Normocephalic, without obvious abnormality, atraumatic Neck: no adenopathy, supple, symmetrical, trachea midline and thyroid not enlarged, symmetric, no tenderness/mass/nodules Lungs: clear to auscultation bilaterally Breasts: normal appearance, no masses or tenderness, port - a -cath in  place left side Heart: normal apical impulse Abdomen: soft, non-tender; bowel sounds normal; no masses,  no organomegaly Pelvic: cervix normal in appearance, external genitalia normal, no adnexal masses or tenderness, no cervical motion tenderness, uterus normal size, shape, and consistency and atrophic vagina Extremities: extremities normal, atraumatic, no cyanosis or edema Pulses: 2+ and symmetric Skin: Skin color, texture, turgor normal. No rashes or lesions Lymph nodes: Cervical, supraclavicular, and axillary nodes normal. Neurologic: Grossly normal    Assessment:     GYN  female exam.      Plan:  1. Screening for cervical cancer  - Cytology - PAP  2. Screening for breast cancer  - MM DIGITAL SCREENING BILATERAL; Future  3. Encounter for routine gynecological examination     See After Visit Summary for Counseling Recommendations

## 2015-04-16 NOTE — Patient Instructions (Signed)
Preventive Care for Adults, Female A healthy lifestyle and preventive care can promote health and wellness. Preventive health guidelines for women include the following key practices.  A routine yearly physical is a good way to check with your health care provider about your health and preventive screening. It is a chance to share any concerns and updates on your health and to receive a thorough exam.  Visit your dentist for a routine exam and preventive care every 6 months. Brush your teeth twice a day and floss once a day. Good oral hygiene prevents tooth decay and gum disease.  The frequency of eye exams is based on your age, health, family medical history, use of contact lenses, and other factors. Follow your health care provider's recommendations for frequency of eye exams.  Eat a healthy diet. Foods like vegetables, fruits, whole grains, low-fat dairy products, and lean protein foods contain the nutrients you need without too many calories. Decrease your intake of foods high in solid fats, added sugars, and salt. Eat the right amount of calories for you.Get information about a proper diet from your health care provider, if necessary.  Regular physical exercise is one of the most important things you can do for your health. Most adults should get at least 150 minutes of moderate-intensity exercise (any activity that increases your heart rate and causes you to sweat) each week. In addition, most adults need muscle-strengthening exercises on 2 or more days a week.  Maintain a healthy weight. The body mass index (BMI) is a screening tool to identify possible weight problems. It provides an estimate of body fat based on height and weight. Your health care provider can find your BMI and can help you achieve or maintain a healthy weight.For adults 20 years and older:  A BMI below 18.5 is considered underweight.  A BMI of 18.5 to 24.9 is normal.  A BMI of 25 to 29.9 is considered overweight.  A  BMI of 30 and above is considered obese.  Maintain normal blood lipids and cholesterol levels by exercising and minimizing your intake of saturated fat. Eat a balanced diet with plenty of fruit and vegetables. Blood tests for lipids and cholesterol should begin at age 45 and be repeated every 5 years. If your lipid or cholesterol levels are high, you are over 50, or you are at high risk for heart disease, you may need your cholesterol levels checked more frequently.Ongoing high lipid and cholesterol levels should be treated with medicines if diet and exercise are not working.  If you smoke, find out from your health care provider how to quit. If you do not use tobacco, do not start.  Lung cancer screening is recommended for adults aged 45-80 years who are at high risk for developing lung cancer because of a history of smoking. A yearly low-dose CT scan of the lungs is recommended for people who have at least a 30-pack-year history of smoking and are a current smoker or have quit within the past 15 years. A pack year of smoking is smoking an average of 1 pack of cigarettes a day for 1 year (for example: 1 pack a day for 30 years or 2 packs a day for 15 years). Yearly screening should continue until the smoker has stopped smoking for at least 15 years. Yearly screening should be stopped for people who develop a health problem that would prevent them from having lung cancer treatment.  If you are pregnant, do not drink alcohol. If you are  breastfeeding, be very cautious about drinking alcohol. If you are not pregnant and choose to drink alcohol, do not have more than 1 drink per day. One drink is considered to be 12 ounces (355 mL) of beer, 5 ounces (148 mL) of wine, or 1.5 ounces (44 mL) of liquor.  Avoid use of street drugs. Do not share needles with anyone. Ask for help if you need support or instructions about stopping the use of drugs.  High blood pressure causes heart disease and increases the risk  of stroke. Your blood pressure should be checked at least every 1 to 2 years. Ongoing high blood pressure should be treated with medicines if weight loss and exercise do not work.  If you are 55-79 years old, ask your health care provider if you should take aspirin to prevent strokes.  Diabetes screening is done by taking a blood sample to check your blood glucose level after you have not eaten for a certain period of time (fasting). If you are not overweight and you do not have risk factors for diabetes, you should be screened once every 3 years starting at age 45. If you are overweight or obese and you are 40-70 years of age, you should be screened for diabetes every year as part of your cardiovascular risk assessment.  Breast cancer screening is essential preventive care for women. You should practice "breast self-awareness." This means understanding the normal appearance and feel of your breasts and may include breast self-examination. Any changes detected, no matter how small, should be reported to a health care provider. Women in their 20s and 30s should have a clinical breast exam (CBE) by a health care provider as part of a regular health exam every 1 to 3 years. After age 40, women should have a CBE every year. Starting at age 40, women should consider having a mammogram (breast X-ray test) every year. Women who have a family history of breast cancer should talk to their health care provider about genetic screening. Women at a high risk of breast cancer should talk to their health care providers about having an MRI and a mammogram every year.  Breast cancer gene (BRCA)-related cancer risk assessment is recommended for women who have family members with BRCA-related cancers. BRCA-related cancers include breast, ovarian, tubal, and peritoneal cancers. Having family members with these cancers may be associated with an increased risk for harmful changes (mutations) in the breast cancer genes BRCA1 and  BRCA2. Results of the assessment will determine the need for genetic counseling and BRCA1 and BRCA2 testing.  Your health care provider may recommend that you be screened regularly for cancer of the pelvic organs (ovaries, uterus, and vagina). This screening involves a pelvic examination, including checking for microscopic changes to the surface of your cervix (Pap test). You may be encouraged to have this screening done every 3 years, beginning at age 21.  For women ages 30-65, health care providers may recommend pelvic exams and Pap testing every 3 years, or they may recommend the Pap and pelvic exam, combined with testing for human papilloma virus (HPV), every 5 years. Some types of HPV increase your risk of cervical cancer. Testing for HPV may also be done on women of any age with unclear Pap test results.  Other health care providers may not recommend any screening for nonpregnant women who are considered low risk for pelvic cancer and who do not have symptoms. Ask your health care provider if a screening pelvic exam is right for   you.  If you have had past treatment for cervical cancer or a condition that could lead to cancer, you need Pap tests and screening for cancer for at least 20 years after your treatment. If Pap tests have been discontinued, your risk factors (such as having a new sexual partner) need to be reassessed to determine if screening should resume. Some women have medical problems that increase the chance of getting cervical cancer. In these cases, your health care provider may recommend more frequent screening and Pap tests.  Colorectal cancer can be detected and often prevented. Most routine colorectal cancer screening begins at the age of 50 years and continues through age 75 years. However, your health care provider may recommend screening at an earlier age if you have risk factors for colon cancer. On a yearly basis, your health care provider may provide home test kits to check  for hidden blood in the stool. Use of a small camera at the end of a tube, to directly examine the colon (sigmoidoscopy or colonoscopy), can detect the earliest forms of colorectal cancer. Talk to your health care provider about this at age 50, when routine screening begins. Direct exam of the colon should be repeated every 5-10 years through age 75 years, unless early forms of precancerous polyps or small growths are found.  People who are at an increased risk for hepatitis B should be screened for this virus. You are considered at high risk for hepatitis B if:  You were born in a country where hepatitis B occurs often. Talk with your health care provider about which countries are considered high risk.  Your parents were born in a high-risk country and you have not received a shot to protect against hepatitis B (hepatitis B vaccine).  You have HIV or AIDS.  You use needles to inject street drugs.  You live with, or have sex with, someone who has hepatitis B.  You get hemodialysis treatment.  You take certain medicines for conditions like cancer, organ transplantation, and autoimmune conditions.  Hepatitis C blood testing is recommended for all people born from 1945 through 1965 and any individual with known risks for hepatitis C.  Practice safe sex. Use condoms and avoid high-risk sexual practices to reduce the spread of sexually transmitted infections (STIs). STIs include gonorrhea, chlamydia, syphilis, trichomonas, herpes, HPV, and human immunodeficiency virus (HIV). Herpes, HIV, and HPV are viral illnesses that have no cure. They can result in disability, cancer, and death.  You should be screened for sexually transmitted illnesses (STIs) including gonorrhea and chlamydia if:  You are sexually active and are younger than 24 years.  You are older than 24 years and your health care provider tells you that you are at risk for this type of infection.  Your sexual activity has changed  since you were last screened and you are at an increased risk for chlamydia or gonorrhea. Ask your health care provider if you are at risk.  If you are at risk of being infected with HIV, it is recommended that you take a prescription medicine daily to prevent HIV infection. This is called preexposure prophylaxis (PrEP). You are considered at risk if:  You are sexually active and do not regularly use condoms or know the HIV status of your partner(s).  You take drugs by injection.  You are sexually active with a partner who has HIV.  Talk with your health care provider about whether you are at high risk of being infected with HIV. If   you choose to begin PrEP, you should first be tested for HIV. You should then be tested every 3 months for as long as you are taking PrEP.  Osteoporosis is a disease in which the bones lose minerals and strength with aging. This can result in serious bone fractures or breaks. The risk of osteoporosis can be identified using a bone density scan. Women ages 67 years and over and women at risk for fractures or osteoporosis should discuss screening with their health care providers. Ask your health care provider whether you should take a calcium supplement or vitamin D to reduce the rate of osteoporosis.  Menopause can be associated with physical symptoms and risks. Hormone replacement therapy is available to decrease symptoms and risks. You should talk to your health care provider about whether hormone replacement therapy is right for you.  Use sunscreen. Apply sunscreen liberally and repeatedly throughout the day. You should seek shade when your shadow is shorter than you. Protect yourself by wearing long sleeves, pants, a wide-brimmed hat, and sunglasses year round, whenever you are outdoors.  Once a month, do a whole body skin exam, using a mirror to look at the skin on your back. Tell your health care provider of new moles, moles that have irregular borders, moles that  are larger than a pencil eraser, or moles that have changed in shape or color.  Stay current with required vaccines (immunizations).  Influenza vaccine. All adults should be immunized every year.  Tetanus, diphtheria, and acellular pertussis (Td, Tdap) vaccine. Pregnant women should receive 1 dose of Tdap vaccine during each pregnancy. The dose should be obtained regardless of the length of time since the last dose. Immunization is preferred during the 27th-36th week of gestation. An adult who has not previously received Tdap or who does not know her vaccine status should receive 1 dose of Tdap. This initial dose should be followed by tetanus and diphtheria toxoids (Td) booster doses every 10 years. Adults with an unknown or incomplete history of completing a 3-dose immunization series with Td-containing vaccines should begin or complete a primary immunization series including a Tdap dose. Adults should receive a Td booster every 10 years.  Varicella vaccine. An adult without evidence of immunity to varicella should receive 2 doses or a second dose if she has previously received 1 dose. Pregnant females who do not have evidence of immunity should receive the first dose after pregnancy. This first dose should be obtained before leaving the health care facility. The second dose should be obtained 4-8 weeks after the first dose.  Human papillomavirus (HPV) vaccine. Females aged 13-26 years who have not received the vaccine previously should obtain the 3-dose series. The vaccine is not recommended for use in pregnant females. However, pregnancy testing is not needed before receiving a dose. If a female is found to be pregnant after receiving a dose, no treatment is needed. In that case, the remaining doses should be delayed until after the pregnancy. Immunization is recommended for any person with an immunocompromised condition through the age of 61 years if she did not get any or all doses earlier. During the  3-dose series, the second dose should be obtained 4-8 weeks after the first dose. The third dose should be obtained 24 weeks after the first dose and 16 weeks after the second dose.  Zoster vaccine. One dose is recommended for adults aged 30 years or older unless certain conditions are present.  Measles, mumps, and rubella (MMR) vaccine. Adults born  before 1957 generally are considered immune to measles and mumps. Adults born in 1957 or later should have 1 or more doses of MMR vaccine unless there is a contraindication to the vaccine or there is laboratory evidence of immunity to each of the three diseases. A routine second dose of MMR vaccine should be obtained at least 28 days after the first dose for students attending postsecondary schools, health care workers, or international travelers. People who received inactivated measles vaccine or an unknown type of measles vaccine during 1963-1967 should receive 2 doses of MMR vaccine. People who received inactivated mumps vaccine or an unknown type of mumps vaccine before 1979 and are at high risk for mumps infection should consider immunization with 2 doses of MMR vaccine. For females of childbearing age, rubella immunity should be determined. If there is no evidence of immunity, females who are not pregnant should be vaccinated. If there is no evidence of immunity, females who are pregnant should delay immunization until after pregnancy. Unvaccinated health care workers born before 1957 who lack laboratory evidence of measles, mumps, or rubella immunity or laboratory confirmation of disease should consider measles and mumps immunization with 2 doses of MMR vaccine or rubella immunization with 1 dose of MMR vaccine.  Pneumococcal 13-valent conjugate (PCV13) vaccine. When indicated, a person who is uncertain of his immunization history and has no record of immunization should receive the PCV13 vaccine. All adults 65 years of age and older should receive this  vaccine. An adult aged 19 years or older who has certain medical conditions and has not been previously immunized should receive 1 dose of PCV13 vaccine. This PCV13 should be followed with a dose of pneumococcal polysaccharide (PPSV23) vaccine. Adults who are at high risk for pneumococcal disease should obtain the PPSV23 vaccine at least 8 weeks after the dose of PCV13 vaccine. Adults older than 51 years of age who have normal immune system function should obtain the PPSV23 vaccine dose at least 1 year after the dose of PCV13 vaccine.  Pneumococcal polysaccharide (PPSV23) vaccine. When PCV13 is also indicated, PCV13 should be obtained first. All adults aged 65 years and older should be immunized. An adult younger than age 65 years who has certain medical conditions should be immunized. Any person who resides in a nursing home or long-term care facility should be immunized. An adult smoker should be immunized. People with an immunocompromised condition and certain other conditions should receive both PCV13 and PPSV23 vaccines. People with human immunodeficiency virus (HIV) infection should be immunized as soon as possible after diagnosis. Immunization during chemotherapy or radiation therapy should be avoided. Routine use of PPSV23 vaccine is not recommended for American Indians, [place] Natives, or people younger than 65 years unless there are medical conditions that require PPSV23 vaccine. When indicated, people who have unknown immunization and have no record of immunization should receive PPSV23 vaccine. One-time revaccination 5 years after the first dose of PPSV23 is recommended for people aged 19-64 years who have chronic kidney failure, nephrotic syndrome, asplenia, or immunocompromised conditions. People who received 1-2 doses of PPSV23 before age 65 years should receive another dose of PPSV23 vaccine at age 65 years or later if at least 5 years have passed since the previous dose. Doses of PPSV23 are not  needed for people immunized with PPSV23 at or after age 65 years.  Meningococcal vaccine. Adults with asplenia or persistent complement component deficiencies should receive 2 doses of quadrivalent meningococcal conjugate (MenACWY-D) vaccine. The doses should be obtained   at least 2 months apart. Microbiologists working with certain meningococcal bacteria, [place] recruits, people at risk during an outbreak, and people who travel to or live in countries with a high rate of meningitis should be immunized. A first-year college student up through age 34 years who is living in a residence hall should receive a dose if she did not receive a dose on or after her 16th birthday. Adults who have certain high-risk conditions should receive one or more doses of vaccine.  Hepatitis A vaccine. Adults who wish to be protected from this disease, have certain high-risk conditions, work with hepatitis A-infected animals, work in hepatitis A research labs, or travel to or work in countries with a high rate of hepatitis A should be immunized. Adults who were previously unvaccinated and who anticipate close contact with an international adoptee during the first 60 days after arrival in the [place] from a country with a high rate of hepatitis A should be immunized.  Hepatitis B vaccine. Adults who wish to be protected from this disease, have certain high-risk conditions, may be exposed to blood or other infectious body fluids, are household contacts or sex partners of hepatitis B positive people, are clients or workers in certain care facilities, or travel to or work in countries with a high rate of hepatitis B should be immunized.  Haemophilus influenzae type b (Hib) vaccine. A previously unvaccinated person with asplenia or sickle cell disease or having a scheduled splenectomy should receive 1 dose of Hib vaccine. Regardless of previous immunization, a recipient of a hematopoietic stem cell transplant should receive a  3-dose series 6-12 months after her successful transplant. Hib vaccine is not recommended for adults with HIV infection. Preventive Services / Frequency Ages 35 to 4 years  Blood pressure check.** / Every 3-5 years.  Lipid and cholesterol check.** / Every 5 years beginning at age 60.  Clinical breast exam.** / Every 3 years for women in their 71s and 10s.  BRCA-related cancer risk assessment.** / For women who have family members with a BRCA-related cancer (breast, ovarian, tubal, or peritoneal cancers).  Pap test.** / Every 2 years from ages 76 through 26. Every 3 years starting at age 61 through age 76 or 93 with a history of 3 consecutive normal Pap tests.  HPV screening.** / Every 3 years from ages 37 through ages 60 to 51 with a history of 3 consecutive normal Pap tests.  Hepatitis C blood test.** / For any individual with known risks for hepatitis C.  Skin self-exam. / Monthly.  Influenza vaccine. / Every year.  Tetanus, diphtheria, and acellular pertussis (Tdap, Td) vaccine.** / Consult your health care provider. Pregnant women should receive 1 dose of Tdap vaccine during each pregnancy. 1 dose of Td every 10 years.  Varicella vaccine.** / Consult your health care provider. Pregnant females who do not have evidence of immunity should receive the first dose after pregnancy.  HPV vaccine. / 3 doses over 6 months, if 93 and younger. The vaccine is not recommended for use in pregnant females. However, pregnancy testing is not needed before receiving a dose.  Measles, mumps, rubella (MMR) vaccine.** / You need at least 1 dose of MMR if you were born in 1957 or later. You may also need a 2nd dose. For females of childbearing age, rubella immunity should be determined. If there is no evidence of immunity, females who are not pregnant should be vaccinated. If there is no evidence of immunity, females who are  pregnant should delay immunization until after pregnancy.  Pneumococcal  13-valent conjugate (PCV13) vaccine.** / Consult your health care provider.  Pneumococcal polysaccharide (PPSV23) vaccine.** / 1 to 2 doses if you smoke cigarettes or if you have certain conditions.  Meningococcal vaccine.** / 1 dose if you are age 68 to 8 years and a Market researcher living in a residence hall, or have one of several medical conditions, you need to get vaccinated against meningococcal disease. You may also need additional booster doses.  Hepatitis A vaccine.** / Consult your health care provider.  Hepatitis B vaccine.** / Consult your health care provider.  Haemophilus influenzae type b (Hib) vaccine.** / Consult your health care provider. Ages 7 to 53 years  Blood pressure check.** / Every year.  Lipid and cholesterol check.** / Every 5 years beginning at age 25 years.  Lung cancer screening. / Every year if you are aged 11-80 years and have a 30-pack-year history of smoking and currently smoke or have quit within the past 15 years. Yearly screening is stopped once you have quit smoking for at least 15 years or develop a health problem that would prevent you from having lung cancer treatment.  Clinical breast exam.** / Every year after age 48 years.  BRCA-related cancer risk assessment.** / For women who have family members with a BRCA-related cancer (breast, ovarian, tubal, or peritoneal cancers).  Mammogram.** / Every year beginning at age 41 years and continuing for as long as you are in good health. Consult with your health care provider.  Pap test.** / Every 3 years starting at age 65 years through age 37 or 70 years with a history of 3 consecutive normal Pap tests.  HPV screening.** / Every 3 years from ages 72 years through ages 60 to 40 years with a history of 3 consecutive normal Pap tests.  Fecal occult blood test (FOBT) of stool. / Every year beginning at age 21 years and continuing until age 5 years. You may not need to do this test if you get  a colonoscopy every 10 years.  Flexible sigmoidoscopy or colonoscopy.** / Every 5 years for a flexible sigmoidoscopy or every 10 years for a colonoscopy beginning at age 35 years and continuing until age 48 years.  Hepatitis C blood test.** / For all people born from [birth year] through 1965 and any individual with known risks for hepatitis C.  Skin self-exam. / Monthly.  Influenza vaccine. / Every year.  Tetanus, diphtheria, and acellular pertussis (Tdap/Td) vaccine.** / Consult your health care provider. Pregnant women should receive 1 dose of Tdap vaccine during each pregnancy. 1 dose of Td every 10 years.  Varicella vaccine.** / Consult your health care provider. Pregnant females who do not have evidence of immunity should receive the first dose after pregnancy.  Zoster vaccine.** / 1 dose for adults aged 30 years or older.  Measles, mumps, rubella (MMR) vaccine.** / You need at least 1 dose of MMR if you were born in 1957 or later. You may also need a second dose. For females of childbearing age, rubella immunity should be determined. If there is no evidence of immunity, females who are not pregnant should be vaccinated. If there is no evidence of immunity, females who are pregnant should delay immunization until after pregnancy.  Pneumococcal 13-valent conjugate (PCV13) vaccine.** / Consult your health care provider.  Pneumococcal polysaccharide (PPSV23) vaccine.** / 1 to 2 doses if you smoke cigarettes or if you have certain conditions.  Meningococcal vaccine.** /  Consult your health care provider.  Hepatitis A vaccine.** / Consult your health care provider.  Hepatitis B vaccine.** / Consult your health care provider.  Haemophilus influenzae type b (Hib) vaccine.** / Consult your health care provider. Ages 64 years and over  Blood pressure check.** / Every year.  Lipid and cholesterol check.** / Every 5 years beginning at age 23 years.  Lung cancer screening. / Every year if you  are aged 16-80 years and have a 30-pack-year history of smoking and currently smoke or have quit within the past 15 years. Yearly screening is stopped once you have quit smoking for at least 15 years or develop a health problem that would prevent you from having lung cancer treatment.  Clinical breast exam.** / Every year after age 74 years.  BRCA-related cancer risk assessment.** / For women who have family members with a BRCA-related cancer (breast, ovarian, tubal, or peritoneal cancers).  Mammogram.** / Every year beginning at age 44 years and continuing for as long as you are in good health. Consult with your health care provider.  Pap test.** / Every 3 years starting at age 58 years through age 22 or 39 years with 3 consecutive normal Pap tests. Testing can be stopped between 65 and 70 years with 3 consecutive normal Pap tests and no abnormal Pap or HPV tests in the past 10 years.  HPV screening.** / Every 3 years from ages 64 years through ages 70 or 61 years with a history of 3 consecutive normal Pap tests. Testing can be stopped between 65 and 70 years with 3 consecutive normal Pap tests and no abnormal Pap or HPV tests in the past 10 years.  Fecal occult blood test (FOBT) of stool. / Every year beginning at age 40 years and continuing until age 27 years. You may not need to do this test if you get a colonoscopy every 10 years.  Flexible sigmoidoscopy or colonoscopy.** / Every 5 years for a flexible sigmoidoscopy or every 10 years for a colonoscopy beginning at age 7 years and continuing until age 32 years.  Hepatitis C blood test.** / For all people born from [birth year] through 1966 and any individual with known risks for hepatitis C.  Osteoporosis screening.** / A one-time screening for women ages 30 years and over and women at risk for fractures or osteoporosis.  Skin self-exam. / Monthly.  Influenza vaccine. / Every year.  Tetanus, diphtheria, and acellular pertussis (Tdap/Td)  vaccine.** / 1 dose of Td every 10 years.  Varicella vaccine.** / Consult your health care provider.  Zoster vaccine.** / 1 dose for adults aged 35 years or older.  Pneumococcal 13-valent conjugate (PCV13) vaccine.** / Consult your health care provider.  Pneumococcal polysaccharide (PPSV23) vaccine.** / 1 dose for all adults aged 46 years and older.  Meningococcal vaccine.** / Consult your health care provider.  Hepatitis A vaccine.** / Consult your health care provider.  Hepatitis B vaccine.** / Consult your health care provider.  Haemophilus influenzae type b (Hib) vaccine.** / Consult your health care provider. ** Family history and personal history of risk and conditions may change your health care provider's recommendations.   This information is not intended to replace advice given to you by your health care provider. Make sure you discuss any questions you have with your health care provider.   Document Released: 03/14/2001 Document Revised: 02/06/2014 Document Reviewed: 06/13/2010 Elsevier Interactive Patient Education [employer].

## 2015-04-20 LAB — CYTOLOGY - PAP

## 2015-04-21 ENCOUNTER — Ambulatory Visit: Payer: Medicare Other | Admitting: Endocrinology

## 2015-04-28 ENCOUNTER — Other Ambulatory Visit: Payer: Self-pay | Admitting: Endocrinology

## 2015-05-05 ENCOUNTER — Telehealth: Payer: Self-pay | Admitting: *Deleted

## 2015-05-05 MED FILL — LANTUS SOLOSTAR 100 UNITS/M: 100 | 90 days supply | Qty: 15 | Fill #1

## 2015-05-05 NOTE — Telephone Encounter (Signed)
Called and spoke to pt. She is willing to r/s from Dr Jaynee Eagles to [place] tomorrow at same time. She will check in 8/8:15am.

## 2015-05-06 ENCOUNTER — Ambulatory Visit (INDEPENDENT_AMBULATORY_CARE_PROVIDER_SITE_OTHER): Payer: Medicare Other | Admitting: Nurse Practitioner

## 2015-05-06 ENCOUNTER — Ambulatory Visit: Payer: Medicare Other | Admitting: Neurology

## 2015-05-06 ENCOUNTER — Encounter: Payer: Self-pay | Admitting: Nurse Practitioner

## 2015-05-06 VITALS — BP 117/88 | HR 119 | Ht 67.0 in | Wt 246.0 lb

## 2015-05-06 DIAGNOSIS — G8929 Other chronic pain: Secondary | ICD-10-CM | POA: Insufficient documentation

## 2015-05-06 DIAGNOSIS — R51 Headache: Secondary | ICD-10-CM

## 2015-05-06 NOTE — Progress Notes (Addendum)
[facility]  PATIENT: Lynn [name] DOB: Feb 22, 1965   REASON FOR VISIT: Follow-up for headache, HISTORY FROM: Patient    HISTORY OF PRESENT ILLNESS:05/06/2015.CM Lynn [name] 50 year old female returns for follow-up. She was last in the office 02/04/2015 by Dr. Jaynee Eagles. At that time she was having worsening of her headaches, more problems with her vision, more difficulty with photosensitivity. She was placed on Depakote in addition to her amitriptyline but she is only taking the Depakote prn. Imitrex continues to work acutely for her. Her prior symptoms are much better, her vision is better without blurring and she describes her headaches as very mild and tolerable. MRI of the brain 02/11/2015 without acute findings and no change from MRI dated March 2014.She is not aware of any foods that are specific for migraine trigger for her.She returns for reevaluation.previous records reviewed     Lynn [name] is a 50 y.o. female here as a follow up. She is a former patient of Dr. Leonie Man and is transitioning to me. She has chronic daily headaches since June 2013 following intracerebral hemorrhage and treatment with chemotherapy for leukemia. She is still on Amitriptyline which works well. She is having shooting pain on the left(points to the occipital area). Happens a few days a week. Sharp, brief, throbbing, lightning bolt. It doesn't happen all day. It is a few times a week. Sesitive to the touch. She has tenderness a few hours a day in another spot where the drain was, feels like pressure, sore and tender to the touch on the right frontal area behind the hairline. The pain started this winter. 3-4 times a week. She has taken ibuprofen and it doesn't really help.   Interval history 02/04/2015: Headaches. She is having at 4 a week. They can last at least several hours . Lorazepam helps. Starts on the left, a dull pain, then spreads to the front, has nausea, no vomiting, here eyes are  really dry and this is exacerbating the headaches, she has been to ophthalmology for her eyes. She is on restasis drops. Light is bothering her since her eyes are dry. She was doing well but within the last few monthe the headaches have returned. She prefers to try a new medication instead of increasing Amitriptyline. She is feeling Depressed and having difficulty sleeping due to her medical conditions. She just labs completed and liver function was normal but creatinine is still elevated. Worse with laying down.   Dr. Leonie Man and Charlott Holler 09/15/2013: She returns for followup after her initial consultation with me on 04/09/12. She did not tolerate Topamax as it did not help and hence stopped it after a few weeks. She in fact develop worsening headache and was hospitalized at [facility] with headache and fever he had Topamax was discontinued and she was started on amitriptyline 10 mg at night. She was also given Phenergan for nausea seems to be working quite well. She also states that the chemotherapy dose has been reduced and that may have helped as the headaches have practically disappeared. She hasn't had headache only one day in the last 4 weeks. She did discontinue the Imitrex , oxycodone and Fioricet. 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 Lab work done on 04/09/12 showed normal ESR, ANA panel, B12, blood chemistries. TSH was slightly suppressed at 0.488miu/ml.   Ms. HHausmannreturns  for headache revisit, states her headaches have been manageable, except she had one severe headache in July with nausea, vomiting and diarrhea, tested positive for cdiff. Feeling better now. Tolerating Elavil at night. Has new complaint os tinging in medial thighs when she puts chin to chest. No associated pain or numbness.  i  REVIEW OF SYSTEMS: Full 14 system review of  systems performed and notable only for those listed, all others are neg:  Constitutional: fatigue  Cardiovascular: leg swelling Ear/Nose/Throat: neg  Skin: neg Eyes: neg Respiratory: neg Gastroitestinal: neg  Hematology/Lymphatic: neg  Endocrine: neg Musculoskeletal:muscle cramps Allergy/Immunology: neg Neurological: history of chronic headaches Psychiatric: neg Sleep : neg   ALLERGIES: Allergies  Allergen Reactions  . Other Rash    Please do not use clear tegaderm dressings as patient states they pull her skin off.  Mepilex is fine.    HOME MEDICATIONS: Outpatient Prescriptions Prior to Visit  Medication Sig Dispense Refill  . ACCU-CHEK AVIVA PLUS test strip USE TO TEST BLOOD GLUCOSE 2 TIMES DAILY  5  . acyclovir (ZOVIRAX) 800 MG tablet Take 1 tablet (800 mg total) by mouth 2 (two) times daily. . 60 tablet 5  . ADVAIR DISKUS 250-50 MCG/DOSE AEPB INHALE 1 PUFF INTO THE LUNGS EVERY 12 HOURS  5  . amitriptyline (ELAVIL) 10 MG tablet Take 2 tablets (20 mg total) by mouth at bedtime. 180 tablet 5  . cetirizine (ZYRTEC) 10 MG tablet Take 1 tablet (10 mg total) by mouth daily. (Patient taking differently: Take 10 mg by mouth daily as needed. ) 30 tablet 0  . divalproex (DEPAKOTE ER) 500 MG 24 hr tablet Take 1 tablet (500 mg total) by mouth at bedtime. 30 tablet 6  . folic acid (FOLVITE) 301 MCG tablet Take 400 mcg by mouth daily.    Marland Kitchen gabapentin (NEURONTIN) 300 MG capsule     . hydrochlorothiazide (MICROZIDE) 12.5 MG capsule Take 12.5 mg by mouth daily.    Marland Kitchen imatinib (GLEEVEC) 100 MG tablet Take 200 mg by mouth daily. Take with meals and large glass of water.Caution:Chemotherapy  Takes 2 tablets once a day    . insulin aspart (NOVOLOG) 100 UNIT/ML FlexPen Inject 16 units with each meal  Plus 18-20 extra units if sugar is over 250 30 mL 3  . Insulin Glargine (LANTUS) 100 UNIT/ML Solostar Pen Inject 12 units everymore, can use sliding scale (Patient taking differently: 8-10 Units. can  use sliding scale) 15 mL 3  . JANUMET XR 50-1000 MG TB24 TAKE 2 TABLETS DAILY 60 tablet 3  . LORazepam (ATIVAN) 1 MG tablet Take 1 mg by mouth as needed.     . magnesium chloride (SLOW-MAG) 64 MG TBEC SR tablet Take 2 tablets by mouth 3 (three) times daily.     . montelukast (SINGULAIR) 5 MG chewable tablet Chew 5 mg by mouth at bedtime.    . Multiple Vitamin (MULTIVITAMIN) tablet Take 1 tablet by mouth daily.    Marland Kitchen NOVOFINE PLUS 32G X 4 MM MISC     . ondansetron (ZOFRAN ODT) 4 MG disintegrating tablet Take 1 tablet (4 mg total) by mouth every 8 (eight) hours as needed for nausea. 30 tablet 1  . potassium chloride SA (K-DUR,KLOR-CON) 20 MEQ tablet TAKE 1 TABLET (20 MEQ TOTAL) BY MOUTH DAILY. 30 tablet 3  . pravastatin (PRAVACHOL) 20 MG tablet TAKE 1 TABLET BY MOUTH DAILY. 30 tablet 3  . predniSONE (DELTASONE) 5 MG tablet takes 20 mg daily  2  . prochlorperazine (COMPAZINE)  5 MG tablet Take 5 mg by mouth as needed.    . Sirolimus 0.5 MG TABS Take 0.5 mg by mouth daily.  2  . sulfamethoxazole-trimethoprim (BACTRIM DS,SEPTRA DS) 800-160 MG per tablet Take 1 tablet by mouth 3 (three) times a week. Monday,Wednesday, and Friday 30 tablet 0  . SUMAtriptan (IMITREX) 20 MG/ACT nasal spray Place 1 spray (20 mg total) into the nose once. May repeat in 2 hours if headache persists or recurs. 10 Inhaler 6  . traMADol (ULTRAM) 50 MG tablet Take 1 tablet (50 mg total) by mouth every 6 (six) hours as needed for moderate pain. 30 tablet 0   No facility-administered medications prior to visit.    PAST MEDICAL HISTORY: Past Medical History  Diagnosis Date  . Hypertension   . Brain bleed ([place])     07/04/11  . Pulmonary embolism ([place])     06/22/11  . Headache(784.0)   . Arthritis   . Anemia   . ALL (acute lymphoblastic leukemia) ([place])   . Leukemia ([place])   . Diabetes mellitus without complication ([place])   . Pneumonia   . Thrush of mouth and esophagus ([place]) 04/09/2014    PAST SURGICAL HISTORY: Past  Surgical History  Procedure Laterality Date  . Cesarean section  2000  . Foot surgery    . Craniotomy  07/04/11  . [place] filter    . Bone marrow transplant    . Lung biopsy  06/19/13  . Eye surgery Right 2005    repair crossed eye    FAMILY HISTORY: Family History  Problem Relation Age of Onset  . Pancreatic cancer Father   . Cancer Mother     colon  . Pancreatic cancer Mother   . Diabetes Neg Hx   . Heart disease Neg Hx   . Asthma Son   . Hypertension Sister     SOCIAL HISTORY: Social History   Social History  . Marital Status: Divorced    Spouse Name: N/A  . Number of Children: 2  . Years of Education: Associates   Occupational History  . N/A    Social History Main Topics  . Smoking status: Never Smoker   . Smokeless tobacco: Never Used  . Alcohol Use: No  . Drug Use: No  . Sexual Activity: Not Currently    Birth Control/ Protection: Abstinence, None   Other Topics Concern  . Not on file   Social History Narrative   Pt lives with her 2 children.    Associates degree   Caffeine Use: 8oz coffee/day      PHYSICAL EXAM  Filed Vitals:   05/06/15 0822  BP: 117/88  Pulse: 119  Height: '5\' 7"'  (1.702 m)  Weight: 246 lb (111.585 kg)   Body mass index is 38.52 kg/(m^2).  Generalized: Well developed, obese female in no acute distress  Head: normocephalic and atraumatic,. Oropharynx benign  Neck: Supple, no carotid bruits  Cardiac: Regular rate rhythm, no murmur  Musculoskeletal: No deformity   Neurological examination   Mentation: Alert oriented to time, place, history taking. Attention span and concentration appropriate. Recent and remote memory intact.  Follows all commands speech and language fluent.   Cranial nerve II-XII: Pupils were equal round reactive to light extraocular movements were full, visual field were full on confrontational test. Facial sensation and strength were normal. hearing was intact to finger rubbing bilaterally. Uvula  tongue midline. head turning and shoulder shrug were normal and symmetric.Tongue protrusion into cheek strength was normal. Motor:  normal bulk and tone, full strength in the BUE, BLE, fine finger movements normal, no pronator drift. No focal weakness Sensory: normal and symmetric to light touch, on the face arms and legs Coordination: finger-nose-finger, heel-to-shin bilaterally, no dysmetria Reflexes: Brachioradialis 2/2, biceps 2/2, triceps 2/2, patellar 2/2, Achilles 2/2, plantar responses were flexor bilaterally. Gait and Station: Rising up from seated position without assistance, normal stance,  moderate stride, good arm swing, smooth turning, able to perform tiptoe, and heel walking without difficulty. Tandem gait is steady  DIAGNOSTIC DATA (LABS, IMAGING, TESTING) -    Component Value Date/Time   NA 136 03/19/2015 0939   NA 143 05/20/2014 1102   K 3.5 03/19/2015 0939   K 4.5 05/20/2014 1102   CL 100 03/19/2015 0939   CL 106 07/09/2012 0949   CO2 29 03/19/2015 0939   CO2 23 05/20/2014 1102   GLUCOSE 106* 03/19/2015 0939   GLUCOSE 135 05/20/2014 1102   GLUCOSE 109* 07/09/2012 0949   BUN 12 03/19/2015 0939   BUN 17.1 05/20/2014 1102   CREATININE 1.47* 03/19/2015 0939   CREATININE 1.3* 05/20/2014 1102   CALCIUM 8.7 03/19/2015 0939   CALCIUM 9.1 05/20/2014 1102   PROT 6.2 03/19/2015 0939   PROT 6.2* 05/20/2014 1102   ALBUMIN 3.6 03/19/2015 0939   ALBUMIN 3.4* 05/20/2014 1102   AST 31 03/19/2015 0939   AST 33 05/20/2014 1102   ALT 22 03/19/2015 0939   ALT 36 05/20/2014 1102   ALKPHOS 75 03/19/2015 0939   ALKPHOS 142 05/20/2014 1102   BILITOT 0.3 03/19/2015 0939   BILITOT 0.41 05/20/2014 1102   GFRNONAA 60* 12/28/2013 0815   GFRAA 69* 12/28/2013 0815    Lab Results  Component Value Date   HGBA1C 6.6* 03/19/2015    ASSESSMENT AND PLAN  50 y.o. year old female  has a past medical history of chronic headache since June 2013 which have improved with amitriptyline. She  is having 3 headaches per week which are mild and tolerable. She wants to stop her Depakote due to weight gain.Imitrex still works acutely.MRI of the brain 02/11/2015 without acute findings and no change from MRI dated March 2014.  PLAN: Headaches improved may discontinue Depakote Continue Elavil 10 mg 2 tablets at bedtime Continue Imitrex acutely Given copy of migraine triggers to include foods and environmental etc. I spent additional 15 minutes in total face to face time with the patient more than 50% of which was spent counseling and coordination of care, reviewing test results reviewing medications and discussing and reviewing the diagnosis of migraine and further treatment options. Importance of keeping a diary if headaches worsen to include the time of the headache what you're doing any other specific information that would be useful. Discussed stress relief techniques such as deep breathing muscle relaxation mental relaxation to music. Discussed importance of exercise, regular meals  and sleep. Sleep deprivation can be a migraine trigger Follow-up in 6 months. Vst time 30 min Dennie Bible, [facility], [facility], APRN  [facility] [address], [place], [place] [address] [phone]  Personally participated in and made any corrections needed to history, physical, neuro exam,assessment and plan as stated above.  I have personally evaluated lab data, reviewed imaging studies and agree with radiology interpretations.    Sarina Ill, MD [facility]

## 2015-05-06 NOTE — Patient Instructions (Signed)
Headaches improved may discontinue Depakote Continue Elavil 10 mg 2 tablets at bedtime Continue Imitrex acutely Given copy of migraine triggers to include foods and environmental etc. Follow-up in 6 months

## 2015-05-07 ENCOUNTER — Ambulatory Visit
Admission: RE | Admit: 2015-05-07 | Discharge: 2015-05-07 | Disposition: A | Discharge status: Home / Self Care | Payer: Medicare Other | Source: Ambulatory Visit | Attending: Family Medicine | Admitting: Family Medicine

## 2015-05-07 ENCOUNTER — Other Ambulatory Visit: Payer: Self-pay | Admitting: Neurology

## 2015-05-07 DIAGNOSIS — Z1231 Encounter for screening mammogram for malignant neoplasm of breast: Secondary | ICD-10-CM | POA: Diagnosis not present

## 2015-05-10 ENCOUNTER — Other Ambulatory Visit: Payer: Self-pay | Admitting: Neurology

## 2015-05-10 ENCOUNTER — Encounter: Payer: Self-pay | Admitting: Endocrinology

## 2015-05-10 ENCOUNTER — Ambulatory Visit (INDEPENDENT_AMBULATORY_CARE_PROVIDER_SITE_OTHER): Payer: Medicare Other | Admitting: Endocrinology

## 2015-05-10 VITALS — BP 110/74 | HR 111 | Temp 98.6°F | Resp 14 | Ht 67.0 in | Wt 243.4 lb

## 2015-05-10 DIAGNOSIS — E1165 Type 2 diabetes mellitus with hyperglycemia: Secondary | ICD-10-CM | POA: Diagnosis not present

## 2015-05-10 DIAGNOSIS — Z794 Long term (current) use of insulin: Secondary | ICD-10-CM

## 2015-05-10 NOTE — Progress Notes (Signed)
Patient ID: Lynn [name], female   DOB: 01-07-1966, 50 y.o.   MRN: [id number]    Reason for Appointment: Followup for Type 2 Diabetes  Referring physician: Donnie Coffin  History of Present Illness:          Diagnosis: Type 2 diabetes mellitus, date of diagnosis: 2013        Past history: She weighed nearly 300 lb in 2013 around that time her diabetes was diagnosed Although her A1c at that time was reported at 7.4 she does not remember being told about diabetes and no treatment was given In 5/15 when she was getting steroids for her bone marrow transplant her blood sugar was over 400 She was started on insulin and had been taking a basal bolus insulin regimen subsequently while on prednisone Although she required small amounts of insulin she could not taper off insulin even when off prednisone  Recent history:    INSULIN regimen is Lantus 12 units in a.m.  She has been on insulin since she has been taking prednisone  She is now taking 10 mg prednisone recently in am since 2 weeks Also now she is taking 2 tablets of Janumet XR without side effects   Last A1c was 6.6 in 2/17  Current blood sugar patterns and problems:  Her fasting blood sugars have been in the normal with only 2 readings recently  He is checking blood sugars sporadically otherwise with a couple of readings after breakfast and some in the early afternoon  She appears to have low normal blood sugars in the late afternoon and twice has had hypoglycemia  She does not do any blood sugars after supper  Mostly eating flavored oatmeal in the morning for breakfast without any protein  Has not lost any weight since her last visit  She is now trying to do more exercise with walking       Oral hypoglycemic drugs the patient is taking are:     Side effects from medications have been: Diarrhea from Metformin over 1000 mg dose  Glucose monitoring:  done less than 1 time a day         Glucometer:  Accu-Chek     Mean values apply above for all meters except median for One Touch  PRE-MEAL Fasting Lunch 2-5 PM  Bedtime Overall  Glucose range: 99, 114   49-112     Mean/median:        POST-MEAL PC Breakfast PC Lunch PC Dinner  Glucose range: 166, 196     Mean/median:       Glycemic control:   Lab Results  Component Value Date   HGBA1C 6.6* 03/19/2015   HGBA1C 6.8* 12/14/2014   HGBA1C 6.5 08/10/2014   Lab Results  Component Value Date   MICROALBUR 1.6 08/10/2014   LDLCALC 85 12/14/2014   CREATININE 1.47* 03/19/2015    Self-care: The diet that the patient has been following is: tries to limit meal size  Meals: 2 meals per day.  with breakfast at 11 am. Supper 5 pm   Exercise: walking more, 30-40 min Dietician visit: Most recent: 5/15 at the hospital              Compliance with the medical regimen: Fair  Weight history:  Wt Readings from Last 3 Encounters:  05/10/15 243 lb 6.4 oz (110.406 kg)  05/06/15 246 lb (111.585 kg)  04/16/15 237 lb 3.2 oz (107.593 kg)      Medication List  This list is accurate as of: 05/10/15  2:57 PM.  Always use your most recent med list.               ACCU-CHEK AVIVA PLUS test strip  Generic drug:  glucose blood  USE TO TEST BLOOD GLUCOSE 2 TIMES DAILY     acyclovir 800 MG tablet  Commonly known as:  ZOVIRAX  Take 1 tablet (800 mg total) by mouth 2 (two) times daily. Marland Kitchen     ADVAIR DISKUS 250-50 MCG/DOSE Aepb  Generic drug:  Fluticasone-Salmeterol  INHALE 1 PUFF INTO THE LUNGS EVERY 12 HOURS     amitriptyline 10 MG tablet  Commonly known as:  ELAVIL  Take 2 tablets (20 mg total) by mouth at bedtime.     cetirizine 10 MG tablet  Commonly known as:  ZYRTEC  Take 1 tablet (10 mg total) by mouth daily.     fluconazole 200 MG tablet  Commonly known as:  DIFLUCAN     folic acid A999333 MCG tablet  Commonly known as:  FOLVITE  Take 400 mcg by mouth daily.     gabapentin 300 MG capsule  Commonly known as:  NEURONTIN  TAKE ONE  CAPSULE BY MOUTH THREE TIMES DAILY     GLEEVEC 100 MG tablet  Generic drug:  imatinib  Take 200 mg by mouth daily. Take with meals and large glass of water.Caution:Chemotherapy  Takes 2 tablets once a day     hydrochlorothiazide 12.5 MG capsule  Commonly known as:  MICROZIDE  Take 12.5 mg by mouth daily.     insulin aspart 100 UNIT/ML FlexPen  Commonly known as:  NOVOLOG  Inject 16 units with each meal  Plus 18-20 extra units if sugar is over 250     Insulin Glargine 100 UNIT/ML Solostar Pen  Commonly known as:  LANTUS  Inject 12 units everymore, can use sliding scale     JANUMET XR 50-1000 MG Tb24  Generic drug:  SitaGLIPtin-MetFORMIN HCl  TAKE 2 TABLETS DAILY     LORazepam 1 MG tablet  Commonly known as:  ATIVAN  Take 1 mg by mouth as needed.     multivitamin tablet  Take 1 tablet by mouth daily.     NOVOFINE PLUS 32G X 4 MM Misc  Generic drug:  Insulin Pen Needle     ondansetron 4 MG disintegrating tablet  Commonly known as:  ZOFRAN ODT  Take 1 tablet (4 mg total) by mouth every 8 (eight) hours as needed for nausea.     potassium chloride SA 20 MEQ tablet  Commonly known as:  K-DUR,KLOR-CON  TAKE 1 TABLET (20 MEQ TOTAL) BY MOUTH DAILY.     pravastatin 20 MG tablet  Commonly known as:  PRAVACHOL  TAKE 1 TABLET BY MOUTH DAILY.     predniSONE 5 MG tablet  Commonly known as:  DELTASONE  takes 20 mg daily     prochlorperazine 5 MG tablet  Commonly known as:  COMPAZINE  Take 5 mg by mouth as needed.     SINGULAIR 5 MG chewable tablet  Generic drug:  montelukast  Chew 5 mg by mouth at bedtime.     Sirolimus 0.5 MG Tabs  Take 0.5 mg by mouth daily.     SLOW-MAG 64 MG Tbec SR tablet  Generic drug:  magnesium chloride  Take 2 tablets by mouth 3 (three) times daily.     sulfamethoxazole-trimethoprim 800-160 MG tablet  Commonly known as:  BACTRIM DS,SEPTRA DS  Take 1 tablet by mouth 3 (three) times a week. Monday,Wednesday, and Friday     SUMAtriptan 20  MG/ACT nasal spray  Commonly known as:  IMITREX  Place 1 spray (20 mg total) into the nose once. May repeat in 2 hours if headache persists or recurs.     traMADol 50 MG tablet  Commonly known as:  ULTRAM  Take 1 tablet (50 mg total) by mouth every 6 (six) hours as needed for moderate pain.        Allergies:  Allergies  Allergen Reactions  . Other Rash    Please do not use clear tegaderm dressings as patient states they pull her skin off.  Mepilex is fine.    Past Medical History  Diagnosis Date  . Hypertension   . Brain bleed ([place])     07/04/11  . Pulmonary embolism ([place])     06/22/11  . Headache(784.0)   . Arthritis   . Anemia   . ALL (acute lymphoblastic leukemia) ([place])   . Leukemia ([place])   . Diabetes mellitus without complication ([place])   . Pneumonia   . Thrush of mouth and esophagus ([place]) 04/09/2014    Past Surgical History  Procedure Laterality Date  . Cesarean section  2000  . Foot surgery    . Craniotomy  07/04/11  . [place] filter    . Bone marrow transplant    . Lung biopsy  06/19/13  . Eye surgery Right 2005    repair crossed eye    Family History  Problem Relation Age of Onset  . Pancreatic cancer Father   . Cancer Mother     colon  . Pancreatic cancer Mother   . Diabetes Neg Hx   . Heart disease Neg Hx   . Asthma Son   . Hypertension Sister     Social History:  reports that she has never smoked. She has never used smokeless tobacco. She reports that she does not drink alcohol or use illicit drugs.    Review of Systems    Last foot exam was in 05/2014      Lipids: She tends to have high triglycerides and mildly increased LDL which is improved, now below 100 She was started on pravastatin 20 mg daily in 9/16      Lab Results  Component Value Date   CHOL 207* 12/14/2014   HDL 84.10 12/14/2014   LDLCALC 85 12/14/2014   LDLDIRECT 109.3 01/12/2014   TRIG 190.0* 12/14/2014   CHOLHDL 2 12/14/2014    She is  taking HCTZ for hypertension,  this is being prescribed by her oncologist   Has mild increase in renal function  Lab Results  Component Value Date   CREATININE 1.47* 03/19/2015   BUN 12 03/19/2015   NA 136 03/19/2015   K 3.5 03/19/2015   CL 100 03/19/2015   CO2 29 03/19/2015     She is being [facility] at [facility] for her various problems including transplant rejection  Her liver functions have been consistently normal       Thyroid:   She had a thyroid biopsy in 6/14 for a left-sided thyroid nodule which was benign  Physical Examination:  BP 110/74 mmHg  Pulse 111  Temp(Src) 98.6 F (37 C) (Oral)  Resp 14  Ht 5\' 7"  (1.702 m)  Wt 243 lb 6.4 oz (110.406 kg)  BMI 38.11 kg/m2  SpO2 98%  LMP 08/09/2011      ASSESSMENT:  Diabetes type 2, uncontrolled  See history of present illness for detailed discussion of  current management, blood sugar patterns and problems identified She is taking less prednisone now and not needing any mealtime insulin although has relatively higher readings after breakfast probably because of taking prednisone at bedtime along with unbalanced high carbohydrate breakfast  She is benefiting from Janumet XR Not clear if she needs insulin anymore especially with her getting some hypoglycemia in the afternoons She is still not remember and to check her blood sugars as directed and doing sporadic readings mostly in the afternoon  Last A1c was done about 2 months ago   PLAN:   Emphasized the need to check blood sugars consistently  Stop insulin  She will call blood sugar readings back next week including some readings after supper to decide on further treatment  If blood sugars are high fasting May consider small dose of Lantus in the evening  May not be able to take Invokana which would be preferred instead of Januvia because of her renal dysfunction  Patient Instructions  Add protein am  No lantus  Check blood sugars on waking up 3  times a week Also check blood  sugars about 2 hours after a meal and do this after different meals by rotation  Recommended blood sugar levels on waking up is 90-130 and about 2 hours after meal is 130-180  Please bring your blood sugar monitor to each visit, thank you  Call me with sugars in 1 week      , 05/10/2015, 2:57 PM   Note: This office note was prepared with Dragon voice recognition system technology. Any transcriptional errors that result from this process are unintentional.

## 2015-05-10 NOTE — Patient Instructions (Signed)
Add protein am  No lantus  Check blood sugars on waking up 3  times a week Also check blood sugars about 2 hours after a meal and do this after different meals by rotation  Recommended blood sugar levels on waking up is 90-130 and about 2 hours after meal is 130-180  Please bring your blood sugar monitor to each visit, thank you  Call me with sugars in 1 week

## 2015-05-11 ENCOUNTER — Other Ambulatory Visit: Payer: Self-pay | Admitting: Neurology

## 2015-05-13 ENCOUNTER — Emergency Department (HOSPITAL_COMMUNITY)
Admission: EM | Admit: 2015-05-13 | Discharge: 2015-05-13 | Disposition: A | Discharge status: Home / Self Care | Payer: Medicare Other | Attending: Emergency Medicine | Admitting: Emergency Medicine

## 2015-05-13 ENCOUNTER — Encounter (HOSPITAL_COMMUNITY): Payer: Self-pay | Admitting: *Deleted

## 2015-05-13 ENCOUNTER — Emergency Department (HOSPITAL_COMMUNITY): Payer: Medicare Other

## 2015-05-13 ENCOUNTER — Emergency Department (HOSPITAL_BASED_OUTPATIENT_CLINIC_OR_DEPARTMENT_OTHER)
Admit: 2015-05-13 | Discharge: 2015-05-13 | Disposition: A | Discharge status: Home / Self Care | Payer: Medicare Other | Attending: Emergency Medicine | Admitting: Emergency Medicine

## 2015-05-13 ENCOUNTER — Ambulatory Visit (HOSPITAL_COMMUNITY)
Admission: EM | Admit: 2015-05-13 | Discharge: 2015-05-13 | Disposition: A | Discharge status: Nursing Home (Includes ICF) | Payer: Medicare Other | Source: Home / Self Care | Attending: Family Medicine | Admitting: Family Medicine

## 2015-05-13 DIAGNOSIS — Z8701 Personal history of pneumonia (recurrent): Secondary | ICD-10-CM | POA: Insufficient documentation

## 2015-05-13 DIAGNOSIS — R000 Tachycardia, unspecified: Secondary | ICD-10-CM | POA: Diagnosis not present

## 2015-05-13 DIAGNOSIS — Z79899 Other long term (current) drug therapy: Secondary | ICD-10-CM | POA: Diagnosis not present

## 2015-05-13 DIAGNOSIS — R0602 Shortness of breath: Secondary | ICD-10-CM | POA: Diagnosis not present

## 2015-05-13 DIAGNOSIS — M1990 Unspecified osteoarthritis, unspecified site: Secondary | ICD-10-CM | POA: Insufficient documentation

## 2015-05-13 DIAGNOSIS — Z86711 Personal history of pulmonary embolism: Secondary | ICD-10-CM | POA: Insufficient documentation

## 2015-05-13 DIAGNOSIS — E119 Type 2 diabetes mellitus without complications: Secondary | ICD-10-CM | POA: Diagnosis not present

## 2015-05-13 DIAGNOSIS — M79609 Pain in unspecified limb: Secondary | ICD-10-CM

## 2015-05-13 DIAGNOSIS — R600 Localized edema: Secondary | ICD-10-CM | POA: Diagnosis not present

## 2015-05-13 DIAGNOSIS — I10 Essential (primary) hypertension: Secondary | ICD-10-CM | POA: Diagnosis not present

## 2015-05-13 DIAGNOSIS — Z7952 Long term (current) use of systemic steroids: Secondary | ICD-10-CM | POA: Insufficient documentation

## 2015-05-13 DIAGNOSIS — R2242 Localized swelling, mass and lump, left lower limb: Secondary | ICD-10-CM | POA: Insufficient documentation

## 2015-05-13 DIAGNOSIS — I82402 Acute embolism and thrombosis of unspecified deep veins of left lower extremity: Secondary | ICD-10-CM

## 2015-05-13 DIAGNOSIS — Z794 Long term (current) use of insulin: Secondary | ICD-10-CM | POA: Diagnosis not present

## 2015-05-13 DIAGNOSIS — Z7984 Long term (current) use of oral hypoglycemic drugs: Secondary | ICD-10-CM | POA: Insufficient documentation

## 2015-05-13 DIAGNOSIS — M79605 Pain in left leg: Secondary | ICD-10-CM | POA: Diagnosis present

## 2015-05-13 LAB — BASIC METABOLIC PANEL
ANION GAP: 12 (ref 5–15)
BUN: 20 mg/dL (ref 6–20)
CALCIUM: 9 mg/dL (ref 8.9–10.3)
CO2: 22 mmol/L (ref 22–32)
CREATININE: 1.64 mg/dL — AB (ref 0.44–1.00)
Chloride: 106 mmol/L (ref 101–111)
GFR, EST AFRICAN AMERICAN: 41 mL/min — AB (ref >60)
GFR, EST NON AFRICAN AMERICAN: 36 mL/min — AB (ref >60)
Glucose, Bld: 125 mg/dL — ABNORMAL HIGH (ref 65–99)
Potassium: 4.4 mmol/L (ref 3.5–5.1)
SODIUM: 140 mmol/L (ref 135–145)

## 2015-05-13 LAB — CBC
HCT: 35 % — ABNORMAL LOW (ref 36.0–46.0)
Hemoglobin: 11.4 g/dL — ABNORMAL LOW (ref 12.0–15.0)
MCH: 30.7 pg (ref 26.0–34.0)
MCHC: 32.6 g/dL (ref 30.0–36.0)
MCV: 94.3 fL (ref 78.0–100.0)
PLATELETS: 252 10*3/uL (ref 150–400)
RBC: 3.71 MIL/uL — AB (ref 3.87–5.11)
RDW: 14.3 % (ref 11.5–15.5)
WBC: 6.5 10*3/uL (ref 4.0–10.5)

## 2015-05-13 MED ORDER — MEDICAL COMPRESSION SOCKS MISC: Status: AC

## 2015-05-13 MED ORDER — MEDICAL COMPRESSION SOCKS MISC: 1.0000 [IU] | Freq: Every day | Status: DC

## 2015-05-13 NOTE — Progress Notes (Signed)
*  Preliminary Results* Left lower extremity venous duplex completed. Left lower extremity is negative for deep vein thrombosis. There is no evidence of left Baker's cyst.  05/13/2015 3:57 PM  Maudry Mayhew, RVT, RDCS, RDMS

## 2015-05-13 NOTE — ED Provider Notes (Signed)
CSN: [id number]     Arrival date & time 05/13/15  1301 History   First MD Initiated Contact with Patient 05/13/15 1335     Chief Complaint  Patient presents with  . Leg Pain   (Consider location/radiation/quality/duration/timing/severity/associated sxs/prior Treatment) Patient is a 50 y.o. female presenting with leg pain. The history is provided by the patient.  Leg Pain Location:  Leg Time since incident:  1 week Injury: no   Leg location:  L lower leg Pain details:    Quality:  Throbbing and pressure   Severity:  Moderate   Onset quality:  Gradual   Duration:  1 week   Progression:  Waxing and waning Chronicity:  Recurrent Dislocation: no   Foreign body present:  No foreign bodies Prior injury to area:  No Relieved by:  None tried Associated symptoms: swelling   Associated symptoms: no fever   Risk factors comment:  H/o dvt, PE , leukemia in 2013.   Past Medical History  Diagnosis Date  . Hypertension   . Brain bleed ([place])     07/04/11  . Pulmonary embolism ([place])     06/22/11  . Headache(784.0)   . Arthritis   . Anemia   . ALL (acute lymphoblastic leukemia) ([place])   . Leukemia ([place])   . Diabetes mellitus without complication ([place])   . Pneumonia   . Thrush of mouth and esophagus ([place]) 04/09/2014   Past Surgical History  Procedure Laterality Date  . Cesarean section  2000  . Foot surgery    . Craniotomy  07/04/11  . [place] filter    . Bone marrow transplant    . Lung biopsy  06/19/13  . Eye surgery Right 2005    repair crossed eye   Family History  Problem Relation Age of Onset  . Pancreatic cancer Father   . Cancer Mother     colon  . Pancreatic cancer Mother   . Diabetes Neg Hx   . Heart disease Neg Hx   . Asthma Son   . Hypertension Sister    Social History  Substance Use Topics  . Smoking status: Never Smoker   . Smokeless tobacco: Never Used  . Alcohol Use: No   OB History    Gravida Para Term Preterm AB TAB SAB Ectopic Multiple Living   2  2 2       2      Review of Systems  Constitutional: Negative.  Negative for fever.  Respiratory: Positive for shortness of breath. Negative for wheezing.   Cardiovascular: Positive for leg swelling. Negative for chest pain.  Gastrointestinal: Negative.   All other systems reviewed and are negative.   Allergies  Other  Home Medications   Prior to Admission medications   Medication Sig Start Date End Date Taking? Authorizing Provider  ACCU-CHEK AVIVA PLUS test strip USE TO TEST BLOOD GLUCOSE 2 TIMES DAILY 10/23/14   Historical Provider, MD  acyclovir (ZOVIRAX) 800 MG tablet Take 1 tablet (800 mg total) by mouth 2 (two) times daily. . 08/27/13   Concha Norway, MD  ADVAIR DISKUS 250-50 MCG/DOSE AEPB INHALE 1 PUFF INTO THE LUNGS EVERY 12 HOURS 01/18/15   Historical Provider, MD  amitriptyline (ELAVIL) 10 MG tablet Take 2 tablets (20 mg total) by mouth at bedtime. 02/04/15   Melvenia Beam, MD  cetirizine (ZYRTEC) 10 MG tablet Take 1 tablet (10 mg total) by mouth daily. Patient taking differently: Take 10 mg by mouth daily as needed.  03/07/14  Melony Overly, MD  fluconazole (DIFLUCAN) 200 MG tablet  05/03/15   Historical Provider, MD  folic acid (FOLVITE) A999333 MCG tablet Take 400 mcg by mouth daily.    Historical Provider, MD  gabapentin (NEURONTIN) 300 MG capsule TAKE ONE CAPSULE BY MOUTH THREE TIMES DAILY 05/10/15   Melvenia Beam, MD  hydrochlorothiazide (MICROZIDE) 12.5 MG capsule Take 12.5 mg by mouth daily.    Historical Provider, MD  imatinib (GLEEVEC) 100 MG tablet Take 200 mg by mouth daily. Take with meals and large glass of water.Caution:Chemotherapy  Takes 2 tablets once a day    Historical Provider, MD  insulin aspart (NOVOLOG) 100 UNIT/ML FlexPen Inject 16 units with each meal  Plus 18-20 extra units if sugar is over 250 06/11/14   Elayne Snare, MD  Insulin Glargine (LANTUS) 100 UNIT/ML Solostar Pen Inject 12 units everymore, can use sliding scale Patient taking differently: 8-10 Units. can  use sliding scale 11/16/14   Elayne Snare, MD  JANUMET XR 50-1000 MG TB24 TAKE 2 TABLETS DAILY 04/28/15   Elayne Snare, MD  LORazepam (ATIVAN) 1 MG tablet Take 1 mg by mouth as needed.  02/06/14   Historical Provider, MD  magnesium chloride (SLOW-MAG) 64 MG TBEC SR tablet Take 2 tablets by mouth 3 (three) times daily.  11/29/12   Historical Provider, MD  montelukast (SINGULAIR) 5 MG chewable tablet Chew 5 mg by mouth at bedtime.    Historical Provider, MD  Multiple Vitamin (MULTIVITAMIN) tablet Take 1 tablet by mouth daily.    Historical Provider, MD  NOVOFINE PLUS 32G X 4 MM MISC  05/31/14   Historical Provider, MD  ondansetron (ZOFRAN ODT) 4 MG disintegrating tablet Take 1 tablet (4 mg total) by mouth every 8 (eight) hours as needed for nausea. 09/24/13   Concha Norway, MD  potassium chloride SA (K-DUR,KLOR-CON) 20 MEQ tablet TAKE 1 TABLET (20 MEQ TOTAL) BY MOUTH DAILY. 02/17/15   Elayne Snare, MD  pravastatin (PRAVACHOL) 20 MG tablet TAKE 1 TABLET BY MOUTH DAILY. 04/28/15   Elayne Snare, MD  predniSONE (DELTASONE) 5 MG tablet takes 20 mg daily 10/23/14   Historical Provider, MD  prochlorperazine (COMPAZINE) 5 MG tablet Take 5 mg by mouth as needed. 10/12/14   Historical Provider, MD  Sirolimus 0.5 MG TABS Take 0.5 mg by mouth daily. 12/31/14   Historical Provider, MD  sulfamethoxazole-trimethoprim (BACTRIM DS,SEPTRA DS) 800-160 MG per tablet Take 1 tablet by mouth 3 (three) times a week. Monday,Wednesday, and Friday 06/19/14   Heath Lark, MD  SUMAtriptan (IMITREX) 20 MG/ACT nasal spray Place 1 spray (20 mg total) into the nose once. May repeat in 2 hours if headache persists or recurs. 02/04/15   Melvenia Beam, MD  traMADol (ULTRAM) 50 MG tablet Take 1 tablet (50 mg total) by mouth every 6 (six) hours as needed for moderate pain. 09/24/13   Concha Norway, MD   Meds Ordered and Administered this Visit  Medications - No data to display  BP 118/80 mmHg  Pulse 121  Temp(Src) 97.7 F (36.5 C) (Oral)  Resp 18  SpO2  100%  LMP 08/09/2011 No data found.   Physical Exam  Constitutional: She is oriented to person, place, and time. She appears well-developed and well-nourished. No distress.  Neck: Normal range of motion. Neck supple.  Cardiovascular: Regular rhythm and normal pulses.  Tachycardia present.   Pulmonary/Chest: Effort normal and breath sounds normal.  Musculoskeletal: She exhibits edema and tenderness.  Neurological: She is alert and oriented  to person, place, and time.  Skin: Skin is warm and dry.  Nursing note and vitals reviewed.   ED Course  Procedures (including critical care time)  Labs Review Labs Reviewed - No data to display  Imaging Review No results found.   Visual Acuity Review  Right Eye Distance:   Left Eye Distance:   Bilateral Distance:    Right Eye Near:   Left Eye Near:    Bilateral Near:         MDM   1. DVT (deep venous thrombosis), left    Sent for w/u of left leg dvl. H/o same with PE.   Billy Fischer, MD 05/13/15 [id number]

## 2015-05-13 NOTE — Discharge Instructions (Signed)

## 2015-05-13 NOTE — ED Notes (Signed)
Pt reports one week hx of swelling to left lower ext from mid calf to foot that improves after being up all night while she sleeps but then swells again.  Denies pain but reports with her hx of PE in past, she did not want to wait to have it checked.

## 2015-05-13 NOTE — ED Provider Notes (Signed)
CSN: [id number]     Arrival date & time 05/13/15  1434 History  By signing my name below, I, Irene Pap, attest that this documentation has been prepared under the direction and in the presence of Etta Quill, NP-C.  Electronically Signed: Irene Pap, ED Scribe. 05/13/2015. 4:07 PM.   Chief Complaint  Patient presents with  . Leg Pain   Patient is a 50 y.o. female presenting with leg pain. The history is provided by the patient.  Leg Pain Location:  Leg Leg location:  L leg Pain details:    Quality:  Aching   Severity:  Moderate   Onset quality:  Gradual   Duration:  1 week   Timing:  Constant   Progression:  Worsening Chronicity:  New Dislocation: no   Foreign body present:  No foreign bodies Ineffective treatments:  None tried Associated symptoms: swelling   HPI Comments: ROX TALMADGE is a 50 y.o. Female with a hx of HTN, brain bleed, PE, ALL in remission, and DM who presents to the Emergency Department complaining of right leg swelling onset one week ago. Pt was sent to the ED from Urgent Care for evaluation of possible DVT. She reports worsening pain with palpation of the area. Pt has a hx of PE but denies use of anticoagulants. Pt reports gradually resolving SOB. She denies chest pain, new nausea, vomiting, or abdominal pain. Pt states that she has been on a regiment of Prednisone recently.   Past Medical History  Diagnosis Date  . Hypertension   . Brain bleed ([place])     07/04/11  . Pulmonary embolism ([place])     06/22/11  . Headache(784.0)   . Arthritis   . Anemia   . ALL (acute lymphoblastic leukemia) ([place])   . Leukemia ([place])   . Diabetes mellitus without complication ([place])   . Pneumonia   . Thrush of mouth and esophagus ([place]) 04/09/2014   Past Surgical History  Procedure Laterality Date  . Cesarean section  2000  . Foot surgery    . Craniotomy  07/04/11  . [place] filter    . Bone marrow transplant    . Lung biopsy  06/19/13  . Eye surgery Right 2005     repair crossed eye   Family History  Problem Relation Age of Onset  . Pancreatic cancer Father   . Cancer Mother     colon  . Pancreatic cancer Mother   . Diabetes Neg Hx   . Heart disease Neg Hx   . Asthma Son   . Hypertension Sister    Social History  Substance Use Topics  . Smoking status: Never Smoker   . Smokeless tobacco: Never Used  . Alcohol Use: No   OB History    Gravida Para Term Preterm AB TAB SAB Ectopic Multiple Living   2 2 2       2      Review of Systems  Respiratory: Positive for shortness of breath.   Cardiovascular: Positive for leg swelling. Negative for chest pain.  Gastrointestinal: Negative for nausea, vomiting and abdominal pain.  Musculoskeletal: Positive for arthralgias.  All other systems reviewed and are negative.   Allergies  Other  Home Medications   Prior to Admission medications   Medication Sig Start Date End Date Taking? Authorizing Provider  ACCU-CHEK AVIVA PLUS test strip USE TO TEST BLOOD GLUCOSE 2 TIMES DAILY 10/23/14   Historical Provider, MD  acyclovir (ZOVIRAX) 800 MG tablet Take 1 tablet (800 mg total)  by mouth 2 (two) times daily. . 08/27/13   Concha Norway, MD  ADVAIR DISKUS 250-50 MCG/DOSE AEPB INHALE 1 PUFF INTO THE LUNGS EVERY 12 HOURS 01/18/15   Historical Provider, MD  amitriptyline (ELAVIL) 10 MG tablet Take 2 tablets (20 mg total) by mouth at bedtime. 02/04/15   Melvenia Beam, MD  cetirizine (ZYRTEC) 10 MG tablet Take 1 tablet (10 mg total) by mouth daily. Patient taking differently: Take 10 mg by mouth daily as needed.  03/07/14   Melony Overly, MD  fluconazole (DIFLUCAN) 200 MG tablet  05/03/15   Historical Provider, MD  folic acid (FOLVITE) A999333 MCG tablet Take 400 mcg by mouth daily.    Historical Provider, MD  gabapentin (NEURONTIN) 300 MG capsule TAKE ONE CAPSULE BY MOUTH THREE TIMES DAILY 05/10/15   Melvenia Beam, MD  hydrochlorothiazide (MICROZIDE) 12.5 MG capsule Take 12.5 mg by mouth daily.    Historical Provider, MD   imatinib (GLEEVEC) 100 MG tablet Take 200 mg by mouth daily. Take with meals and large glass of water.Caution:Chemotherapy  Takes 2 tablets once a day    Historical Provider, MD  insulin aspart (NOVOLOG) 100 UNIT/ML FlexPen Inject 16 units with each meal  Plus 18-20 extra units if sugar is over 250 06/11/14   Elayne Snare, MD  Insulin Glargine (LANTUS) 100 UNIT/ML Solostar Pen Inject 12 units everymore, can use sliding scale Patient taking differently: 8-10 Units. can use sliding scale 11/16/14   Elayne Snare, MD  JANUMET XR 50-1000 MG TB24 TAKE 2 TABLETS DAILY 04/28/15   Elayne Snare, MD  LORazepam (ATIVAN) 1 MG tablet Take 1 mg by mouth as needed.  02/06/14   Historical Provider, MD  magnesium chloride (SLOW-MAG) 64 MG TBEC SR tablet Take 2 tablets by mouth 3 (three) times daily.  11/29/12   Historical Provider, MD  montelukast (SINGULAIR) 5 MG chewable tablet Chew 5 mg by mouth at bedtime.    Historical Provider, MD  Multiple Vitamin (MULTIVITAMIN) tablet Take 1 tablet by mouth daily.    Historical Provider, MD  NOVOFINE PLUS 32G X 4 MM MISC  05/31/14   Historical Provider, MD  ondansetron (ZOFRAN ODT) 4 MG disintegrating tablet Take 1 tablet (4 mg total) by mouth every 8 (eight) hours as needed for nausea. 09/24/13   Concha Norway, MD  potassium chloride SA (K-DUR,KLOR-CON) 20 MEQ tablet TAKE 1 TABLET (20 MEQ TOTAL) BY MOUTH DAILY. 02/17/15   Elayne Snare, MD  pravastatin (PRAVACHOL) 20 MG tablet TAKE 1 TABLET BY MOUTH DAILY. 04/28/15   Elayne Snare, MD  predniSONE (DELTASONE) 5 MG tablet takes 20 mg daily 10/23/14   Historical Provider, MD  prochlorperazine (COMPAZINE) 5 MG tablet Take 5 mg by mouth as needed. 10/12/14   Historical Provider, MD  Sirolimus 0.5 MG TABS Take 0.5 mg by mouth daily. 12/31/14   Historical Provider, MD  sulfamethoxazole-trimethoprim (BACTRIM DS,SEPTRA DS) 800-160 MG per tablet Take 1 tablet by mouth 3 (three) times a week. Monday,Wednesday, and Friday 06/19/14   Heath Lark, MD  SUMAtriptan  (IMITREX) 20 MG/ACT nasal spray Place 1 spray (20 mg total) into the nose once. May repeat in 2 hours if headache persists or recurs. 02/04/15   Melvenia Beam, MD  traMADol (ULTRAM) 50 MG tablet Take 1 tablet (50 mg total) by mouth every 6 (six) hours as needed for moderate pain. 09/24/13   Concha Norway, MD   BP 118/82 mmHg  Pulse 107  Temp(Src) 98.2 F (36.8 C) (Oral)  Resp 20  Ht 5\' 6"  (1.676 m)  Wt 243 lb (110.224 kg)  BMI 39.24 kg/m2  SpO2 99%  LMP 08/09/2011 Physical Exam  Constitutional: She is oriented to person, place, and time. She appears well-developed and well-nourished.  HENT:  Head: Normocephalic and atraumatic.  Eyes: EOM are normal. Pupils are equal, round, and reactive to light.  Neck: Normal range of motion. Neck supple.  Cardiovascular: Regular rhythm and normal heart sounds.  Tachycardia present.  Exam reveals no gallop and no friction rub.   No murmur heard. Mildly tachycardic  Pulmonary/Chest: Effort normal and breath sounds normal. No tachypnea. She has no wheezes. She exhibits no tenderness.  Mild SOB with exertion  Abdominal: Soft. There is no tenderness.  Musculoskeletal: Normal range of motion. She exhibits edema.  1+ edema to left foot and ankle; pain to left foot into calf  Neurological: She is alert and oriented to person, place, and time.  Skin: Skin is warm and dry.  Psychiatric: She has a normal mood and affect. Her behavior is normal.  Nursing note and vitals reviewed.   ED Course  Procedures (including critical care time) DIAGNOSTIC STUDIES: Oxygen Saturation is 99% on RA, normal by my interpretation.    COORDINATION OF CARE: 2:58 PM-Discussed treatment plan which includes CXR, CBC panel, CMP, UA with pt at bedside and pt agreed to plan.    Labs Review Labs Reviewed  BASIC METABOLIC PANEL - Abnormal; Notable for the following:    Glucose, Bld 125 (*)    Creatinine, Ser 1.64 (*)    GFR calc non Af Amer 36 (*)    GFR calc Af Amer 41 (*)     All other components within normal limits  CBC - Abnormal; Notable for the following:    RBC 3.71 (*)    Hemoglobin 11.4 (*)    HCT 35.0 (*)    All other components within normal limits    Imaging Review Dg Chest 2 View  05/13/2015  CLINICAL DATA:  Short of breath.  Bone marrow transplant 2015. EXAM: CHEST  2 VIEW COMPARISON:  12/28/2013 FINDINGS: Postop changes left lung with surgical clips and scarring. Negative for infiltrate effusion. No mass or adenopathy. Heart size is normal. Negative for heart failure. Port-A-Cath tip in the lower SVC unchanged. IMPRESSION: Postop changes left lung. No superimposed acute cardiopulmonary abnormality. Electronically Signed   By: Franchot Gallo [name]   On: 05/13/2015 15:28   I have personally reviewed and evaluated these images and lab results as part of my medical decision-making.   EKG Interpretation None     [place] study negative for DVT. MDM  Patient [place] negative for DVT. She denies chest pain. During ROS, she does report some intermittent exertional dyspnea that is typically associated with her anemia, but that is not worse than normal. Low current suspicion for PE, but return precautions emphasized with patient.  Patient discussed with Dr. Maryan Rued.  Will recommend compression stockings. Follow-up with PCP in the next several days.    Pt appears safe for discharge. Final diagnoses:  None    I personally performed the services described in this documentation, which was scribed in my presence. The recorded information has been reviewed and is accurate.   Etta Quill, NP 05/13/15 1929  Blanchie Dessert, MD 05/13/15 2236

## 2015-05-13 NOTE — ED Notes (Signed)
PT sent here from [facility] for r/o DVT r/t L leg swelling.  Hx of PE (pt denies anticoagulants).  Also c/o some sob but denies chest pain.

## 2015-05-13 NOTE — ED Notes (Signed)
Pt  Has  Some pain     l  Lower  Leg  With  Some  Swelling  Present           Pt   Reports    A  History  Of    Blood  Clots  In  Past    Pt  Is  Awake  And  Alert          -   History     Of  Leukemia    Some   Shortness    Of  Breath    History    Of     Blood  Clots  In her  Leg

## 2015-05-13 NOTE — ED Notes (Signed)
Returned from [place].  No distress noted.

## 2015-05-18 DIAGNOSIS — D89811 Chronic graft-versus-host disease: Secondary | ICD-10-CM | POA: Diagnosis not present

## 2015-05-18 DIAGNOSIS — C9101 Acute lymphoblastic leukemia, in remission: Secondary | ICD-10-CM | POA: Diagnosis not present

## 2015-05-18 DIAGNOSIS — R739 Hyperglycemia, unspecified: Secondary | ICD-10-CM | POA: Diagnosis not present

## 2015-05-18 DIAGNOSIS — R682 Dry mouth, unspecified: Secondary | ICD-10-CM | POA: Diagnosis not present

## 2015-05-18 DIAGNOSIS — D89813 Graft-versus-host disease, unspecified: Secondary | ICD-10-CM | POA: Diagnosis not present

## 2015-05-18 DIAGNOSIS — Z9484 Stem cells transplant status: Secondary | ICD-10-CM | POA: Diagnosis not present

## 2015-05-18 DIAGNOSIS — T380X5A Adverse effect of glucocorticoids and synthetic analogues, initial encounter: Secondary | ICD-10-CM | POA: Diagnosis not present

## 2015-05-18 DIAGNOSIS — J42 Unspecified chronic bronchitis: Secondary | ICD-10-CM | POA: Diagnosis not present

## 2015-06-09 DIAGNOSIS — D89811 Chronic graft-versus-host disease: Secondary | ICD-10-CM | POA: Diagnosis not present

## 2015-06-09 DIAGNOSIS — H04129 Dry eye syndrome of unspecified lacrimal gland: Secondary | ICD-10-CM | POA: Diagnosis not present

## 2015-06-09 DIAGNOSIS — H04123 Dry eye syndrome of bilateral lacrimal glands: Secondary | ICD-10-CM | POA: Diagnosis not present

## 2015-06-15 DIAGNOSIS — C9101 Acute lymphoblastic leukemia, in remission: Secondary | ICD-10-CM | POA: Diagnosis not present

## 2015-06-15 DIAGNOSIS — Z9484 Stem cells transplant status: Secondary | ICD-10-CM | POA: Diagnosis not present

## 2015-06-15 DIAGNOSIS — R682 Dry mouth, unspecified: Secondary | ICD-10-CM | POA: Diagnosis not present

## 2015-06-15 DIAGNOSIS — H04123 Dry eye syndrome of bilateral lacrimal glands: Secondary | ICD-10-CM | POA: Diagnosis not present

## 2015-06-15 DIAGNOSIS — D89811 Chronic graft-versus-host disease: Secondary | ICD-10-CM | POA: Diagnosis not present

## 2015-06-15 DIAGNOSIS — R0981 Nasal congestion: Secondary | ICD-10-CM | POA: Diagnosis not present

## 2015-06-15 DIAGNOSIS — R739 Hyperglycemia, unspecified: Secondary | ICD-10-CM | POA: Diagnosis not present

## 2015-06-15 DIAGNOSIS — T380X5A Adverse effect of glucocorticoids and synthetic analogues, initial encounter: Secondary | ICD-10-CM | POA: Diagnosis not present

## 2015-06-16 IMAGING — CR DG CHEST 2V
2 series · 2 of 2 positions shown · non-contrast
Comparison: 12/03/2012

CLINICAL DATA: Shortness of breath, cough, diagnosed with
pneumonia, history leukemia, hypertension, pulmonary embolism

EXAM:
CHEST  2 VIEW

[x chest ap]
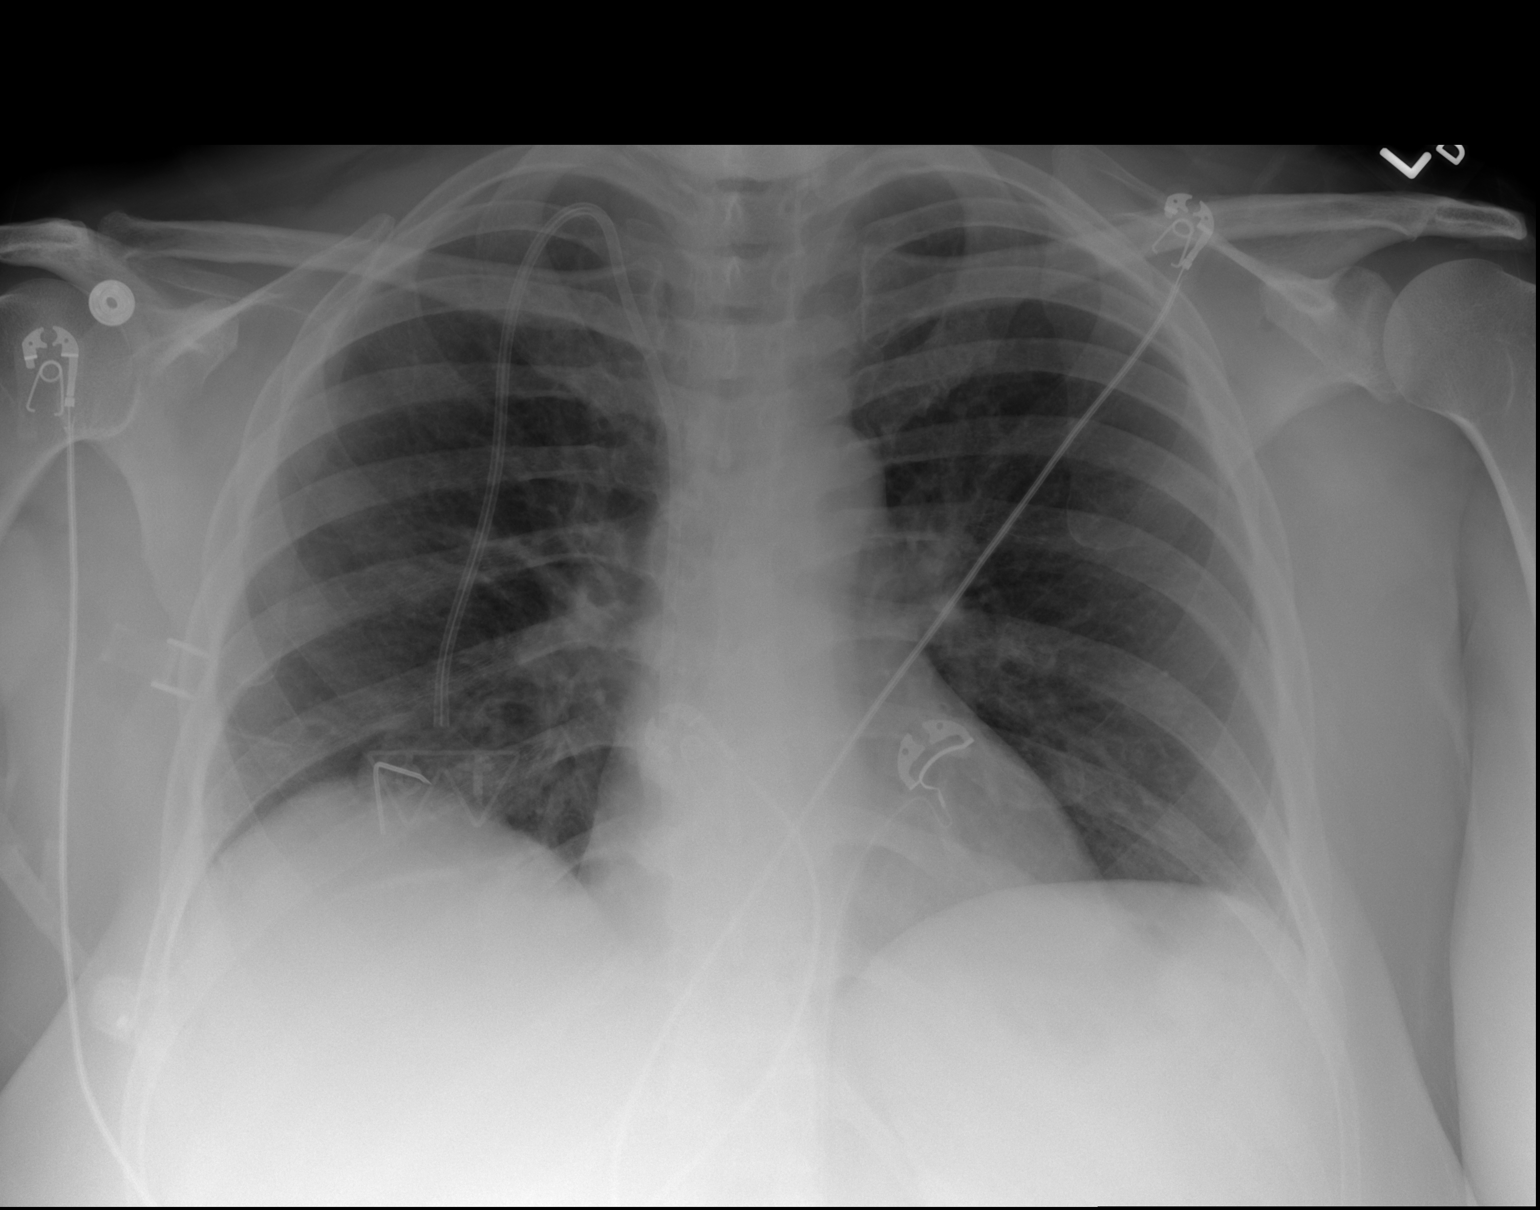

[w chest lat]
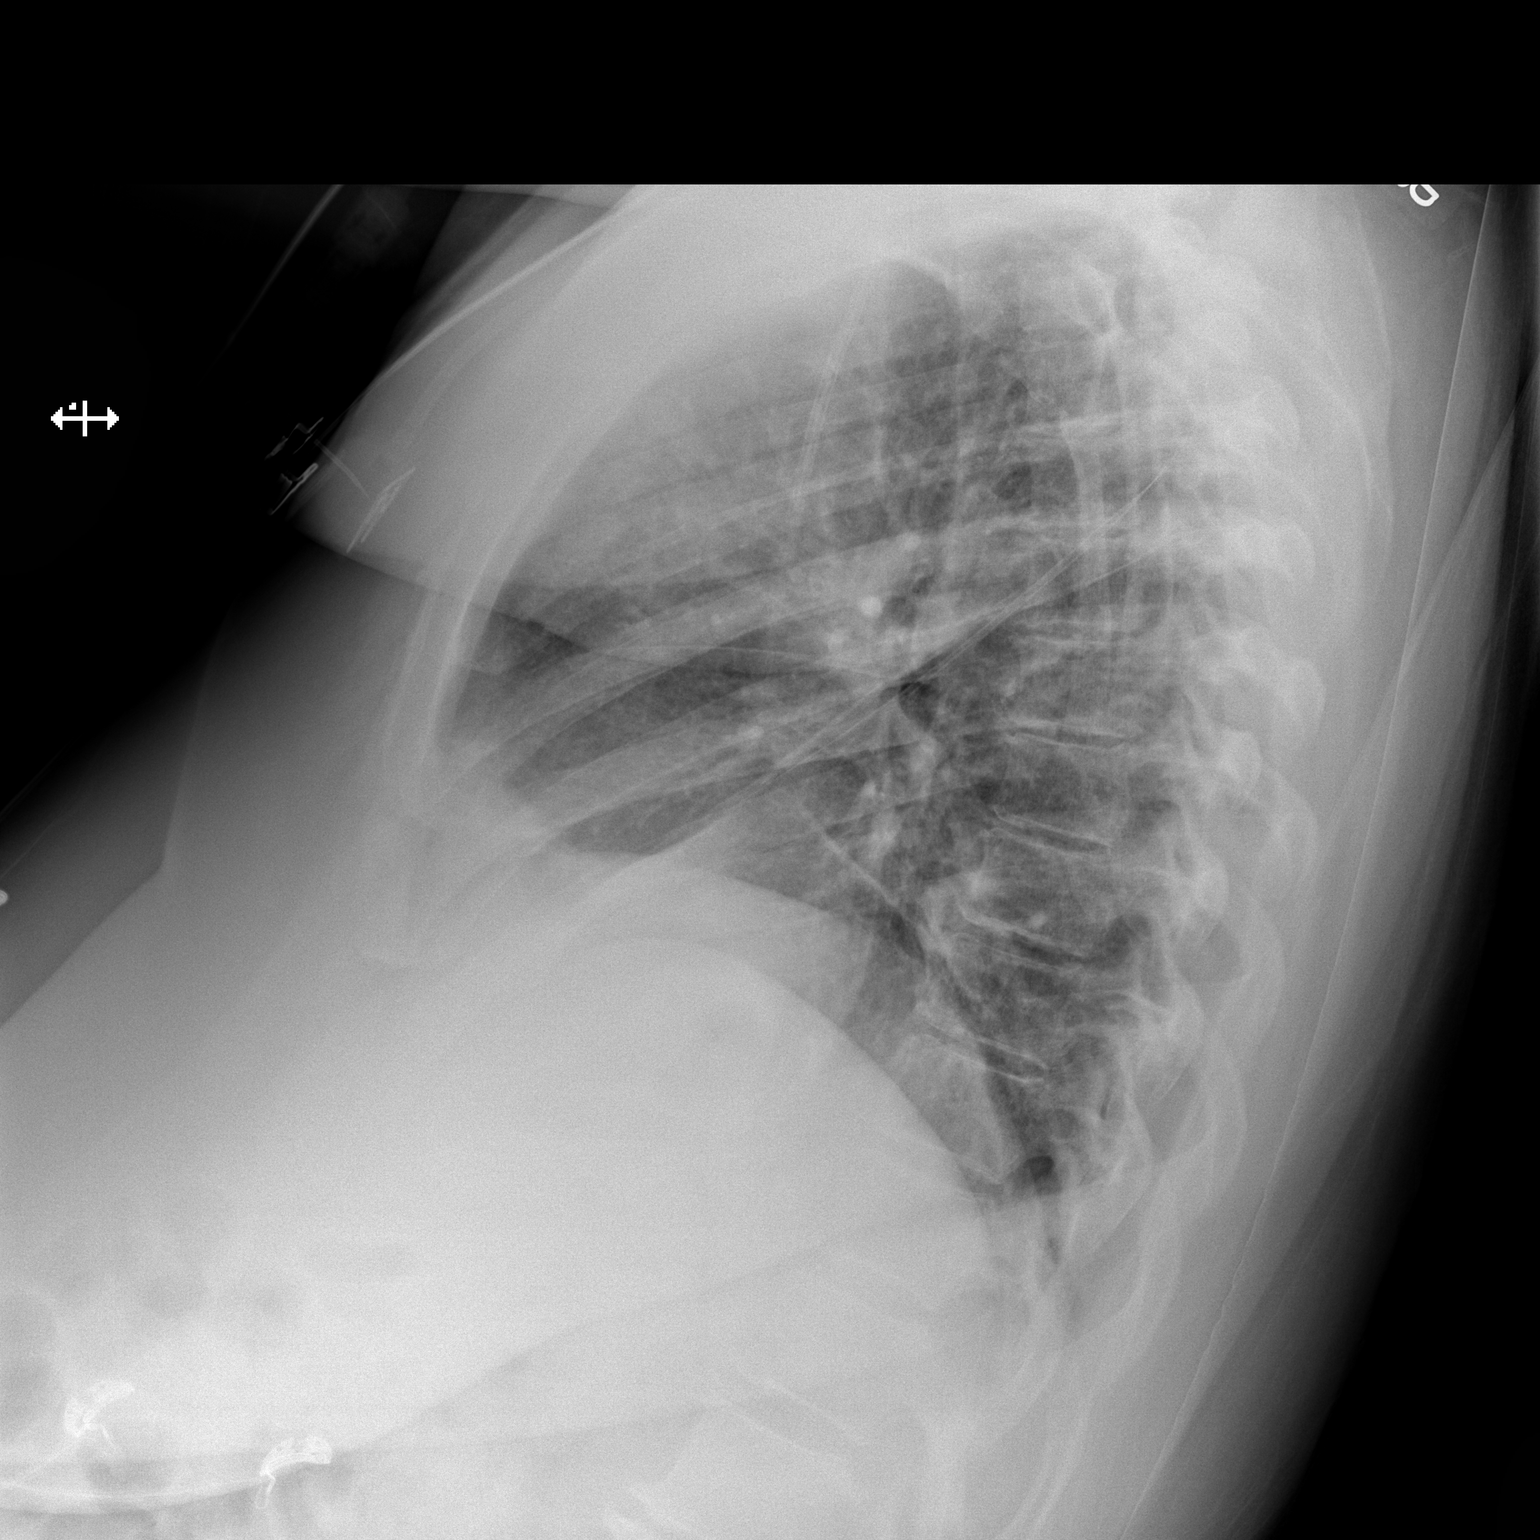

[2 of 2 positions shown; findings below may reference images not displayed]

FINDINGS: RIGHT jugular Port-A-Cath tip projecting over cavoatrial junction.

Normal heart size, mediastinal contours, and pulmonary vascularity.

Minimal RIGHT basilar atelectasis.

Lungs otherwise clear.

No pleural effusion or pneumothorax.

Bones unremarkable.
IMPRESSION: Mild RIGHT basilar atelectasis.

## 2015-06-22 DIAGNOSIS — R609 Edema, unspecified: Secondary | ICD-10-CM | POA: Diagnosis not present

## 2015-07-02 ENCOUNTER — Other Ambulatory Visit: Payer: Self-pay | Admitting: Endocrinology

## 2015-07-07 ENCOUNTER — Other Ambulatory Visit (INDEPENDENT_AMBULATORY_CARE_PROVIDER_SITE_OTHER): Payer: Medicare Other

## 2015-07-07 DIAGNOSIS — E1165 Type 2 diabetes mellitus with hyperglycemia: Secondary | ICD-10-CM

## 2015-07-07 DIAGNOSIS — Z794 Long term (current) use of insulin: Secondary | ICD-10-CM

## 2015-07-07 LAB — COMPREHENSIVE METABOLIC PANEL
ALT: 14 U/L (ref 0–35)
AST: 22 U/L (ref 0–37)
Albumin: 3.6 g/dL (ref 3.5–5.2)
Alkaline Phosphatase: 83 U/L (ref 39–117)
BILIRUBIN TOTAL: 0.2 mg/dL (ref 0.2–1.2)
BUN: 18 mg/dL (ref 6–23)
CALCIUM: 8.8 mg/dL (ref 8.4–10.5)
CHLORIDE: 103 meq/L (ref 96–112)
CO2: 30 meq/L (ref 19–32)
CREATININE: 1.4 mg/dL — AB (ref 0.40–1.20)
GFR: 51.02 mL/min — ABNORMAL LOW (ref >60.00)
GLUCOSE: 91 mg/dL (ref 70–99)
Potassium: 3.6 mEq/L (ref 3.5–5.1)
SODIUM: 139 meq/L (ref 135–145)
Total Protein: 6.3 g/dL (ref 6.0–8.3)

## 2015-07-07 LAB — HEMOGLOBIN A1C: Hgb A1c MFr Bld: 6 % (ref 4.6–6.5)

## 2015-07-12 ENCOUNTER — Ambulatory Visit (INDEPENDENT_AMBULATORY_CARE_PROVIDER_SITE_OTHER): Payer: Medicare Other | Admitting: Endocrinology

## 2015-07-12 ENCOUNTER — Encounter: Payer: Self-pay | Admitting: Endocrinology

## 2015-07-12 VITALS — BP 109/75 | HR 101 | Ht 67.0 in | Wt 237.0 lb

## 2015-07-12 DIAGNOSIS — E669 Obesity, unspecified: Secondary | ICD-10-CM | POA: Diagnosis not present

## 2015-07-12 DIAGNOSIS — Z794 Long term (current) use of insulin: Secondary | ICD-10-CM | POA: Diagnosis not present

## 2015-07-12 DIAGNOSIS — E1165 Type 2 diabetes mellitus with hyperglycemia: Secondary | ICD-10-CM | POA: Diagnosis not present

## 2015-07-12 NOTE — Patient Instructions (Signed)
Check blood sugars on waking up 2-3  times a week Also check blood sugars about 2 hours after a meal and do this after different meals by rotation  Recommended blood sugar levels on waking up is 90-130 and about 2 hours after meal is 130-160  Please bring your blood sugar monitor to each visit, thank you  Stop insulin   Take both Janumet at dinner

## 2015-07-12 NOTE — Progress Notes (Signed)
Patient ID: Lynn [name], female   DOB: May 25, 1965, 50 y.o.   MRN: [id number]    Reason for Appointment: Followup for Type 2 Diabetes  Referring physician: Donnie Coffin  History of Present Illness:          Diagnosis: Type 2 diabetes mellitus, date of diagnosis: 2013        Past history: She weighed nearly 300 lb in 2013 around that time her diabetes was diagnosed Although her A1c at that time was reported at 7.4 she does not remember being told about diabetes and no treatment was given In 5/15 when she was getting steroids for her bone marrow transplant her blood sugar was over 400 She was started on insulin and had been taking a basal bolus insulin regimen subsequently while on prednisone Although she required small amounts of insulin she could not taper off insulin even when off prednisone  Recent history:    INSULIN regimen is Lantus 10 units in p.m.  She has been on insulin since she has been taking prednisone  She is now taking 20 mg prednisone recently in am since 4 weeks, previously Lantus Also now she is taking 2 tablets of Janumet XR without side effects   Last A1c is 6.0, previously 6.6 in 2/17 On her last visit she was told to try and stop her insulin since blood sugars were relatively low  Current blood sugar patterns and problems:  Her fasting blood sugars have been in the normal range with only 2 readings recently  Despite her not taking Novolog in the morning for breakfast and taking prednisone she has periodic low blood sugars after her first meal  She does take Janumet in the morning at breakfast and supper time also  She does not think she is eating much in the morning because of some nausea issues  She is continuing her insulin despite blood sugars being normal and only one relatively high reading of 162 after supper  She says she would take Novolog if her blood sugar goes up  Not able to exercise recently because of leg edema       Oral  hypoglycemic drugs the patient is taking are: Janumet XR twice a day    Side effects from medications have been: Diarrhea from Metformin over 1000 mg dose  Glucose monitoring:  done less than 1 time a day         Glucometer:  Accu-Chek   Mean values apply above for all meters except median for One Touch  PRE-MEAL Fasting pCB  Dinner Bedtime Overall  Glucose range: 84, 127  50-93   78, 162    Mean/median:     88   Glycemic control:   Lab Results  Component Value Date   HGBA1C 6.0 07/07/2015   HGBA1C 6.6* 03/19/2015   HGBA1C 6.8* 12/14/2014   Lab Results  Component Value Date   MICROALBUR 1.6 08/10/2014   LDLCALC 85 12/14/2014   CREATININE 1.40* 07/07/2015    Self-care: The diet that the patient has been following is: tries to limit meal size  Meals: 2 meals per day.  with breakfast at 11 am. Supper 5 pm   Exercise: walking less Recently Dietician visit: Most recent: 5/15 at the hospital              Compliance with the medical regimen: Fair  Weight history:  Wt Readings from Last 3 Encounters:  07/12/15 237 lb (107.502 kg)  05/13/15 243 lb (110.224 kg)  05/10/15 243 lb 6.4 oz (110.406 kg)   Lab on 07/07/2015  Component Date Value Ref Range Status  . Hgb A1c MFr Bld 07/07/2015 6.0  4.6 - 6.5 % Final   Glycemic Control Guidelines for People with Diabetes:Non Diabetic:  <6%Goal of Therapy: <7%Additional Action Suggested:  >8%   . Sodium 07/07/2015 139  135 - 145 mEq/L Final  . Potassium 07/07/2015 3.6  3.5 - 5.1 mEq/L Final  . Chloride 07/07/2015 103  96 - 112 mEq/L Final  . CO2 07/07/2015 30  19 - 32 mEq/L Final  . Glucose, Bld 07/07/2015 91  70 - 99 mg/dL Final  . BUN 07/07/2015 18  6 - 23 mg/dL Final  . Creatinine, Ser 07/07/2015 1.40* 0.40 - 1.20 mg/dL Final  . Total Bilirubin 07/07/2015 0.2  0.2 - 1.2 mg/dL Final  . Alkaline Phosphatase 07/07/2015 83  39 - 117 U/L Final  . AST 07/07/2015 22  0 - 37 U/L Final  . ALT 07/07/2015 14  0 - 35 U/L Final  . Total  Protein 07/07/2015 6.3  6.0 - 8.3 g/dL Final  . Albumin 07/07/2015 3.6  3.5 - 5.2 g/dL Final  . Calcium 07/07/2015 8.8  8.4 - 10.5 mg/dL Final  . GFR 07/07/2015 51.02* >60.00 mL/min Final      Medication List       This list is accurate as of: 07/12/15  9:22 AM.  Always use your most recent med list.               ACCU-CHEK AVIVA PLUS test strip  Generic drug:  glucose blood  USE TO TEST BLOOD GLUCOSE 2 TIMES DAILY     acyclovir 800 MG tablet  Commonly known as:  ZOVIRAX  Take 1 tablet (800 mg total) by mouth 2 (two) times daily. Marland Kitchen     ADVAIR DISKUS 250-50 MCG/DOSE Aepb  Generic drug:  Fluticasone-Salmeterol  INHALE 1 PUFF INTO THE LUNGS EVERY 12 HOURS     amitriptyline 10 MG tablet  Commonly known as:  ELAVIL  Take 2 tablets (20 mg total) by mouth at bedtime.     CELLCEPT 250 MG capsule  Generic drug:  mycophenolate  Take 250 mg by mouth.     cetirizine 10 MG tablet  Commonly known as:  ZYRTEC  Take 1 tablet (10 mg total) by mouth daily.     fluconazole 200 MG tablet  Commonly known as:  DIFLUCAN     folic acid A999333 MCG tablet  Commonly known as:  FOLVITE  Take 400 mcg by mouth daily.     gabapentin 300 MG capsule  Commonly known as:  NEURONTIN  TAKE ONE CAPSULE BY MOUTH THREE TIMES DAILY     GLEEVEC 100 MG tablet  Generic drug:  imatinib  Take 200 mg by mouth daily. Take with meals and large glass of water.Caution:Chemotherapy  Takes 2 tablets once a day     hydrochlorothiazide 12.5 MG capsule  Commonly known as:  MICROZIDE  Take 12.5 mg by mouth daily.     insulin aspart 100 UNIT/ML FlexPen  Commonly known as:  NOVOLOG  Inject 16 units with each meal  Plus 18-20 extra units if sugar is over 250     Insulin Glargine 100 UNIT/ML Solostar Pen  Commonly known as:  LANTUS  Inject 12 units everymore, can use sliding scale     JANUMET XR 50-1000 MG Tb24  Generic drug:  SitaGLIPtin-MetFORMIN HCl  TAKE 2 TABLETS DAILY  LORazepam 1 MG tablet  Commonly  known as:  ATIVAN  Take 1 mg by mouth as needed.     Medical Compression Socks Misc  Please dispense one pair.     multivitamin tablet  Take 1 tablet by mouth daily.     NOVOFINE PLUS 32G X 4 MM Misc  Generic drug:  Insulin Pen Needle     ondansetron 4 MG disintegrating tablet  Commonly known as:  ZOFRAN ODT  Take 1 tablet (4 mg total) by mouth every 8 (eight) hours as needed for nausea.     potassium chloride SA 20 MEQ tablet  Commonly known as:  K-DUR,KLOR-CON  TAKE 1 TABLET (20 MEQ TOTAL) BY MOUTH DAILY.     pravastatin 20 MG tablet  Commonly known as:  PRAVACHOL  TAKE 1 TABLET BY MOUTH DAILY.     predniSONE 5 MG tablet  Commonly known as:  DELTASONE  takes 20 mg daily     prochlorperazine 5 MG tablet  Commonly known as:  COMPAZINE  Take 5 mg by mouth as needed.     SINGULAIR 5 MG chewable tablet  Generic drug:  montelukast  Chew 5 mg by mouth at bedtime.     Sirolimus 0.5 MG Tabs  Take 0.5 mg by mouth daily.     SLOW-MAG 64 MG Tbec SR tablet  Generic drug:  magnesium chloride  Take 2 tablets by mouth 3 (three) times daily.     sulfamethoxazole-trimethoprim 800-160 MG tablet  Commonly known as:  BACTRIM DS,SEPTRA DS  Take 1 tablet by mouth 3 (three) times a week. Monday,Wednesday, and Friday     SUMAtriptan 20 MG/ACT nasal spray  Commonly known as:  IMITREX  Place 1 spray (20 mg total) into the nose once. May repeat in 2 hours if headache persists or recurs.     traMADol 50 MG tablet  Commonly known as:  ULTRAM  Take 1 tablet (50 mg total) by mouth every 6 (six) hours as needed for moderate pain.        Allergies:  Allergies  Allergen Reactions  . Other Rash    Please do not use clear tegaderm dressings as patient states they pull her skin off.  Mepilex is fine.    Past Medical History  Diagnosis Date  . Hypertension   . Brain bleed ([place])     07/04/11  . Pulmonary embolism ([place])     06/22/11  . Headache(784.0)   . Arthritis   . Anemia   . ALL  (acute lymphoblastic leukemia) ([place])   . Leukemia ([place])   . Diabetes mellitus without complication ([place])   . Pneumonia   . Thrush of mouth and esophagus ([place]) 04/09/2014    Past Surgical History  Procedure Laterality Date  . Cesarean section  2000  . Foot surgery    . Craniotomy  07/04/11  . [place] filter    . Bone marrow transplant    . Lung biopsy  06/19/13  . Eye surgery Right 2005    repair crossed eye    Family History  Problem Relation Age of Onset  . Pancreatic cancer Father   . Cancer Mother     colon  . Pancreatic cancer Mother   . Diabetes Neg Hx   . Heart disease Neg Hx   . Asthma Son   . Hypertension Sister     Social History:  reports that she has never smoked. She has never used smokeless tobacco. She reports that she does  not drink alcohol or use illicit drugs.    Review of Systems    Last foot exam was in 05/2014      Lipids: She tends to have high triglycerides and mildly increased LDL which is improved, Last below 100 She was started on pravastatin 20 mg daily in 9/16      Lab Results  Component Value Date   CHOL 207* 12/14/2014   HDL 84.10 12/14/2014   LDLCALC 85 12/14/2014   LDLDIRECT 109.3 01/12/2014   TRIG 190.0* 12/14/2014   CHOLHDL 2 12/14/2014    She is  taking HCTZ for hypertension, this is being prescribed by her oncologist   Has mild increase in renal function, Stable  Lab Results  Component Value Date   CREATININE 1.40* 07/07/2015   BUN 18 07/07/2015   NA 139 07/07/2015   K 3.6 07/07/2015   CL 103 07/07/2015   CO2 30 07/07/2015     She is being followed at [facility] for her various problems including transplant rejection  Her liver functions have been normal      Thyroid:   She had a thyroid biopsy in 6/14 for a left-sided thyroid nodule which was benign  Physical Examination:  BP 109/75 mmHg  Pulse 101  Ht 5\' 7"  (1.702 m)  Wt 237 lb (107.502 kg)  BMI 37.11 kg/m2  LMP 08/09/2011       ASSESSMENT:  Diabetes type 2, uncontrolled  See history of present illness for detailed discussion of  current management, blood sugar patterns and problems identified  Despite taking prednisone she is still tending to get low blood sugars with her Lantus insulin She appears to be responding to Janumet XR and most likely is not insulin deficient at this time A1c is actually lower at 6.0 She does not appear to understand the blood sugar targets and does not also understand the action profile of Lantus and Novolog Most likely she tends to have low readings after her first meal from taking the Janumet and with also being on Lantus She does not check her sugars and often has only a couple of readings after supper which are not high   PLAN:   Emphasized the need to check blood sugars consistently at various times and discussed blood sugar targets  Stop insulin  She will take both Janumet tablets at dinnertime  She will only take Novolog if her blood sugars are consistently over 160 after her evening meal  Again discussed that Lantus controlled only fasting readings and is needed only if her fasting readings are at least 130 consistently  Hopefully she can start exercising again and lose weight  Balanced meals with some protein at each meal  Patient Instructions  Check blood sugars on waking up 2-3  times a week Also check blood sugars about 2 hours after a meal and do this after different meals by rotation  Recommended blood sugar levels on waking up is 90-130 and about 2 hours after meal is 130-160  Please bring your blood sugar monitor to each visit, thank you  Stop insulin   Take both Janumet at dinner    Counseling time on subjects discussed above is over 50% of today's 25 minute visit  , 07/12/2015, 9:22 AM   Note: This office note was prepared with Estate agent. Any transcriptional errors that result from this process are  unintentional.

## 2015-07-13 DIAGNOSIS — Z79899 Other long term (current) drug therapy: Secondary | ICD-10-CM | POA: Diagnosis not present

## 2015-07-13 DIAGNOSIS — E119 Type 2 diabetes mellitus without complications: Secondary | ICD-10-CM | POA: Diagnosis not present

## 2015-07-13 DIAGNOSIS — C9101 Acute lymphoblastic leukemia, in remission: Secondary | ICD-10-CM | POA: Diagnosis not present

## 2015-07-13 DIAGNOSIS — J42 Unspecified chronic bronchitis: Secondary | ICD-10-CM | POA: Diagnosis not present

## 2015-07-13 DIAGNOSIS — Z794 Long term (current) use of insulin: Secondary | ICD-10-CM | POA: Diagnosis not present

## 2015-07-13 DIAGNOSIS — R0609 Other forms of dyspnea: Secondary | ICD-10-CM | POA: Diagnosis not present

## 2015-07-13 DIAGNOSIS — Z9484 Stem cells transplant status: Secondary | ICD-10-CM | POA: Diagnosis not present

## 2015-07-13 DIAGNOSIS — J84116 Cryptogenic organizing pneumonia: Secondary | ICD-10-CM | POA: Diagnosis not present

## 2015-07-13 DIAGNOSIS — Z86711 Personal history of pulmonary embolism: Secondary | ICD-10-CM | POA: Diagnosis not present

## 2015-09-05 ENCOUNTER — Other Ambulatory Visit: Payer: Self-pay | Admitting: Endocrinology

## 2015-09-06 ENCOUNTER — Other Ambulatory Visit: Payer: Self-pay

## 2015-09-06 ENCOUNTER — Other Ambulatory Visit: Payer: Self-pay | Admitting: Endocrinology

## 2015-09-06 MED ORDER — PRAVASTATIN SODIUM 20 MG PO TABS: 20.0000 mg | ORAL_TABLET | Freq: Every day | ORAL | 3 refills | Status: DC

## 2015-09-06 MED ORDER — SITAGLIP PHOS-METFORMIN HCL ER 50-1000 MG PO TB24: 2.0000 | ORAL_TABLET | Freq: Every day | ORAL | 3 refills | Status: DC

## 2015-09-06 MED FILL — NOVOLOG FLEXPEN SYRINGE: 100 | 44 days supply | Qty: 30 | Fill #0

## 2015-09-07 ENCOUNTER — Other Ambulatory Visit (INDEPENDENT_AMBULATORY_CARE_PROVIDER_SITE_OTHER): Payer: Medicare Other

## 2015-09-07 DIAGNOSIS — Z794 Long term (current) use of insulin: Secondary | ICD-10-CM

## 2015-09-07 DIAGNOSIS — R7989 Other specified abnormal findings of blood chemistry: Secondary | ICD-10-CM

## 2015-09-07 DIAGNOSIS — E1165 Type 2 diabetes mellitus with hyperglycemia: Secondary | ICD-10-CM | POA: Diagnosis not present

## 2015-09-07 LAB — COMPREHENSIVE METABOLIC PANEL
ALBUMIN: 3.8 g/dL (ref 3.5–5.2)
ALK PHOS: 101 U/L (ref 39–117)
ALT: 20 U/L (ref 0–35)
AST: 28 U/L (ref 0–37)
BILIRUBIN TOTAL: 0.2 mg/dL (ref 0.2–1.2)
BUN: 16 mg/dL (ref 6–23)
CALCIUM: 9 mg/dL (ref 8.4–10.5)
CO2: 25 meq/L (ref 19–32)
CREATININE: 1.69 mg/dL — AB (ref 0.40–1.20)
Chloride: 103 mEq/L (ref 96–112)
GFR: 41.03 mL/min — AB (ref >60.00)
Glucose, Bld: 110 mg/dL — ABNORMAL HIGH (ref 70–99)
Potassium: 3.6 mEq/L (ref 3.5–5.1)
Sodium: 138 mEq/L (ref 135–145)
TOTAL PROTEIN: 6.6 g/dL (ref 6.0–8.3)

## 2015-09-07 LAB — LIPID PANEL
CHOLESTEROL: 219 mg/dL — AB (ref 0–200)
HDL: 49.1 mg/dL (ref >39.00)
NonHDL: 169.44
TRIGLYCERIDES: 260 mg/dL — AB (ref 0.0–149.0)
Total CHOL/HDL Ratio: 4
VLDL: 52 mg/dL — ABNORMAL HIGH (ref 0.0–40.0)

## 2015-09-07 LAB — LDL CHOLESTEROL, DIRECT: Direct LDL: 119 mg/dL

## 2015-09-08 LAB — FRUCTOSAMINE: FRUCTOSAMINE: 226 umol/L (ref 0–285)

## 2015-09-13 ENCOUNTER — Ambulatory Visit (INDEPENDENT_AMBULATORY_CARE_PROVIDER_SITE_OTHER): Payer: Medicare Other | Admitting: Endocrinology

## 2015-09-13 ENCOUNTER — Encounter: Payer: Self-pay | Admitting: Endocrinology

## 2015-09-13 VITALS — BP 118/82 | HR 115 | Ht 67.0 in | Wt 228.0 lb

## 2015-09-13 DIAGNOSIS — E1165 Type 2 diabetes mellitus with hyperglycemia: Secondary | ICD-10-CM

## 2015-09-13 DIAGNOSIS — N289 Disorder of kidney and ureter, unspecified: Secondary | ICD-10-CM

## 2015-09-13 NOTE — Progress Notes (Signed)
Patient ID: Lynn [name], female   DOB: [birth year], 50 y.o.   MRN: [id number]    Reason for Appointment: Followup for Type 2 Diabetes  Referring physician: Donnie Coffin  History of Present Illness:          Diagnosis: Type 2 diabetes mellitus, date of diagnosis: 2013        Past history: She weighed nearly 300 lb in 2013 around that time her diabetes was diagnosed Although her A1c at that time was reported at 7.4 she does not remember being told about diabetes and no treatment was given In 5/15 when she was getting steroids for her bone marrow transplant her blood sugar was over 400 She was started on insulin and had been taking a basal bolus insulin regimen subsequently while on prednisone Although she required small amounts of insulin she could not taper off insulin even when off prednisone  Recent history:    INSULIN regimen is NovoLog occasionally as needed  She had been on insulin since she has been taking prednisone  She is now off prednisone and not taking any regular insulin doses Also now she is taking 2 tablets of Janumet XR without side effects   Last A1c is 6.0, previously 6.6 in 2/17 and fructosamine is fairly good at 220  Current blood sugar patterns and problems:  Her fasting blood sugars have been in the normal as judged by the lab glucose of 110 but has not checked at home  She says that she has had occasional nausea and has been eating irregularly  Highest reading 220 after eating bagel and banana    High readings: only 2 readings recently  Has only rare blood sugars in the evenings and none after evening meal  Not able to exercise recently because of shortness of breath       Oral hypoglycemic drugs the patient is taking are: Janumet XR twice a day    Side effects from medications have been: Diarrhea from Metformin over 1000 mg dose  Glucose monitoring:  done less than 1 time a day         Glucometer:  Accu-Chek   Only 8 readings  available, range 79-220 with average 139 Has only 2 high readings of 220 and 171 around midday  Glycemic control:   Lab Results  Component Value Date   HGBA1C 6.0 07/07/2015   HGBA1C 6.6 (H) 03/19/2015   HGBA1C 6.8 (H) 12/14/2014   Lab Results  Component Value Date   MICROALBUR 1.6 08/10/2014   LDLCALC 85 12/14/2014   CREATININE 1.69 (H) 09/07/2015    Self-care: The diet that the patient has been following is: tries to limit meal size  Meals: 2 meals per day.  with breakfast at 11 am. Supper 5 pm   Exercise: walking less Recently Dietician visit: Most recent: 5/15 at the hospital              Compliance with the medical regimen: Fair  Weight history:  Wt Readings from Last 3 Encounters:  09/13/15 228 lb (103.4 kg)  07/12/15 237 lb (107.5 kg)  05/13/15 243 lb (110.2 kg)   Lab on 09/07/2015  Component Date Value Ref Range Status  . Sodium 09/07/2015 138  135 - 145 mEq/L Final  . Potassium 09/07/2015 3.6  3.5 - 5.1 mEq/L Final  . Chloride 09/07/2015 103  96 - 112 mEq/L Final  . CO2 09/07/2015 25  19 - 32 mEq/L Final  . Glucose, Bld 09/07/2015 110*  70 - 99 mg/dL Final  . BUN 09/07/2015 16  6 - 23 mg/dL Final  . Creatinine, Ser 09/07/2015 1.69* 0.40 - 1.20 mg/dL Final  . Total Bilirubin 09/07/2015 0.2  0.2 - 1.2 mg/dL Final  . Alkaline Phosphatase 09/07/2015 101  39 - 117 U/L Final  . AST 09/07/2015 28  0 - 37 U/L Final  . ALT 09/07/2015 20  0 - 35 U/L Final  . Total Protein 09/07/2015 6.6  6.0 - 8.3 g/dL Final  . Albumin 09/07/2015 3.8  3.5 - 5.2 g/dL Final  . Calcium 09/07/2015 9.0  8.4 - 10.5 mg/dL Final  . GFR 09/07/2015 41.03* >60.00 mL/min Final  . Fructosamine 09/08/2015 226  0 - 285 umol/L Final   Comment: Published reference interval for apparently healthy subjects between age 51 and 110 is 23 - 285 umol/L and in a poorly controlled diabetic population is 228 - 563 umol/L with a mean of 396 umol/L.   Marland Kitchen Cholesterol 09/07/2015 219* 0 - 200 mg/dL Final  .  Triglycerides 09/07/2015 260.0* 0.0 - 149.0 mg/dL Final  . HDL 09/07/2015 49.10  >39.00 mg/dL Final  . VLDL 09/07/2015 52.0* 0.0 - 40.0 mg/dL Final  . Total CHOL/HDL Ratio 09/07/2015 4   Final  . NonHDL 09/07/2015 169.44   Final  . Direct LDL 09/07/2015 119.0  mg/dL Final      Medication List       Accurate as of 09/13/15 10:21 AM. Always use your most recent med list.          ACCU-CHEK AVIVA PLUS test strip Generic drug:  glucose blood USE TO TEST BLOOD GLUCOSE 2 TIMES DAILY   acyclovir 800 MG tablet Commonly known as:  ZOVIRAX Take 1 tablet (800 mg total) by mouth 2 (two) times daily. Marland Kitchen   ADVAIR DISKUS 250-50 MCG/DOSE Aepb Generic drug:  Fluticasone-Salmeterol INHALE 1 PUFF INTO THE LUNGS EVERY 12 HOURS   amitriptyline 10 MG tablet Commonly known as:  ELAVIL Take 2 tablets (20 mg total) by mouth at bedtime.   CELLCEPT 250 MG capsule Generic drug:  mycophenolate Take 250 mg by mouth.   cetirizine 10 MG tablet Commonly known as:  ZYRTEC Take 1 tablet (10 mg total) by mouth daily.   fluconazole 200 MG tablet Commonly known as:  DIFLUCAN   folic acid A999333 MCG tablet Commonly known as:  FOLVITE Take 400 mcg by mouth daily.   gabapentin 300 MG capsule Commonly known as:  NEURONTIN TAKE ONE CAPSULE BY MOUTH THREE TIMES DAILY   GLEEVEC 100 MG tablet Generic drug:  imatinib Take 200 mg by mouth daily. Take with meals and large glass of water.Caution:Chemotherapy  Takes 2 tablets once a day   hydrochlorothiazide 12.5 MG capsule Commonly known as:  MICROZIDE Take 12.5 mg by mouth daily.   Insulin Glargine 100 UNIT/ML Solostar Pen Commonly known as:  LANTUS Inject 12 units everymore, can use sliding scale   LORazepam 1 MG tablet Commonly known as:  ATIVAN Take 1 mg by mouth as needed.   Medical Compression Socks Misc Please dispense one pair.   multivitamin tablet Take 1 tablet by mouth daily.   NOVOFINE PLUS 32G X 4 MM Misc Generic drug:  Insulin Pen  Needle   NOVOLOG FLEXPEN 100 UNIT/ML FlexPen Generic drug:  insulin aspart INJECT 16 UNITS WITH EACH MEAL PLUS 18-20 EXTRA UNITS IF SUGAR IS OVER 250   ondansetron 4 MG disintegrating tablet Commonly known as:  ZOFRAN ODT Take 1 tablet (4  mg total) by mouth every 8 (eight) hours as needed for nausea.   potassium chloride SA 20 MEQ tablet Commonly known as:  K-DUR,KLOR-CON TAKE 1 TABLET (20 MEQ TOTAL) BY MOUTH DAILY.   pravastatin 20 MG tablet Commonly known as:  PRAVACHOL Take 1 tablet (20 mg total) by mouth daily.   prochlorperazine 5 MG tablet Commonly known as:  COMPAZINE Take 5 mg by mouth as needed.   RESTASIS 0.05 % ophthalmic emulsion Generic drug:  cycloSPORINE PLACE 1 DROP INTO BOTH EYES 2 TIMES DAILY.   SINGULAIR 5 MG chewable tablet Generic drug:  montelukast Chew 5 mg by mouth at bedtime.   Sirolimus 0.5 MG Tabs Take 0.5 mg by mouth daily.   SitaGLIPtin-MetFORMIN HCl 50-1000 MG Tb24 Commonly known as:  JANUMET XR Take 2 tablets by mouth daily.   SLOW-MAG 64 MG Tbec SR tablet Generic drug:  magnesium chloride Take 2 tablets by mouth 3 (three) times daily.   sulfamethoxazole-trimethoprim 800-160 MG tablet Commonly known as:  BACTRIM DS,SEPTRA DS Take 1 tablet by mouth 3 (three) times a week. Monday,Wednesday, and Friday   SUMAtriptan 20 MG/ACT nasal spray Commonly known as:  IMITREX Place 1 spray (20 mg total) into the nose once. May repeat in 2 hours if headache persists or recurs.   traMADol 50 MG tablet Commonly known as:  ULTRAM Take 1 tablet (50 mg total) by mouth every 6 (six) hours as needed for moderate pain.       Allergies:  Allergies  Allergen Reactions  . Other Rash    Please do not use clear tegaderm dressings as patient states they pull her skin off.  Mepilex is fine.    Past Medical History:  Diagnosis Date  . ALL (acute lymphoblastic leukemia) ([place])   . Anemia   . Arthritis   . Brain bleed ([place])    07/04/11  . Diabetes  mellitus without complication ([place])   . Headache(784.0)   . Hypertension   . Leukemia ([place])   . Pneumonia   . Pulmonary embolism ([place])    06/22/11  . Thrush of mouth and esophagus ([place]) 04/09/2014    Past Surgical History:  Procedure Laterality Date  . BONE MARROW TRANSPLANT    . CESAREAN SECTION  2000  . CRANIOTOMY  07/04/11  . EYE SURGERY Right 2005   repair crossed eye  . FOOT SURGERY    . greenfield filter    . LUNG BIOPSY  06/19/13    Family History  Problem Relation Age of Onset  . Pancreatic cancer Father   . Cancer Mother     colon  . Pancreatic cancer Mother   . Diabetes Neg Hx   . Heart disease Neg Hx   . Asthma Son   . Hypertension Sister     Social History:  reports that she has never smoked. She has never used smokeless tobacco. She reports that she does not drink alcohol or use drugs.    Review of Systems    Last foot exam was in 05/2014      Lipids: She tends to have high triglycerides and mildly increased LDL which is improved, Last below 100 She was started on pravastatin 20 mg daily in 9/16, irreglar recently because of occasional vomiting.      Lab Results  Component Value Date   CHOL 219 (H) 09/07/2015   HDL 49.10 09/07/2015   LDLCALC 85 12/14/2014   LDLDIRECT 119.0 09/07/2015   TRIG 260.0 (H) 09/07/2015   CHOLHDL  4 09/07/2015    She is  taking HCTZ for hypertension, this is being prescribed by her oncologist   Has mild increase in renal function, GFR 41 now  Lab Results  Component Value Date   CREATININE 1.69 (H) 09/07/2015   BUN 16 09/07/2015   NA 138 09/07/2015   K 3.6 09/07/2015   CL 103 09/07/2015   CO2 25 09/07/2015     She is being [facility] for her various problems including transplant rejection  Her liver functions have been normal      Thyroid:   She had a thyroid biopsy in 6/14 for a left-sided thyroid nodule which was benign  Left foot may swell, recently not a problem.     Physical  Examination:  BP 118/82   Pulse (!) 115   Ht 5\' 7"  (1.702 m)   Wt 228 lb (103.4 kg)   LMP 08/09/2011   SpO2 98%   BMI 35.71 kg/m   ASSESSMENT:  Diabetes type 2, uncontrolled  See history of present illness for detailed discussion of  current management, blood sugar patterns and problems identified  Currently on Janumet alone She has had fairly good blood sugars overall except occasional postprandial hyperglycemia based on diet, may tend to have unbalanced meals with more carbohydrate at times Has been very regular with glucose monitoring and this is discussed above She has taken Novolog as needed when blood sugar goes up  Creatinine 1.69  PLAN:   Since GFR is below 45 will reduce Janumet to once a day until next visit  She will hold HCTZ since  renal function is slightly worse, possibly from inconsistent fluid intake with her GI problems  She needs to discuss with PCP or her oncologist for alternatives for blood pressure  Hopefully she can start exercising again and lose weight  Balanced meals with some protein at each meal Currently does not appear to understand the principles of using basal and bolus insulins and this was discussed in detail and guidelines given   Patient Instructions  Check blood sugars on waking up  3x per week  Also check blood sugars about 2 hours after a meal and do this after different meals by rotation  Recommended blood sugar levels on waking up is 90-130 and about 2 hours after meal is 130-160  Please bring your blood sugar monitor to each visit, thank you  Stop HCTZ  Restart Pravastatin daily  Reduce Janumet to 1 tab till kidney test better  Novolog if sugars go up after meals  Counseling time on subjects discussed above is over 50% of today's 25 minute visit  , 09/13/2015, 10:21 AM   Note: This office note was prepared with Estate agent. Any transcriptional errors that result from this  process are unintentional.

## 2015-09-13 NOTE — Patient Instructions (Addendum)
Check blood sugars on waking up  3x per week  Also check blood sugars about 2 hours after a meal and do this after different meals by rotation  Recommended blood sugar levels on waking up is 90-130 and about 2 hours after meal is 130-160  Please bring your blood sugar monitor to each visit, thank you  Stop HCTZ  Restart Pravastatin daily  Reduce Janumet to 1 tab till kidney test better  Novolog if sugars go up after meals

## 2015-09-14 DIAGNOSIS — Z9484 Stem cells transplant status: Secondary | ICD-10-CM | POA: Diagnosis not present

## 2015-09-14 DIAGNOSIS — Z794 Long term (current) use of insulin: Secondary | ICD-10-CM | POA: Diagnosis not present

## 2015-09-14 DIAGNOSIS — C9101 Acute lymphoblastic leukemia, in remission: Secondary | ICD-10-CM | POA: Diagnosis not present

## 2015-09-14 DIAGNOSIS — Z9221 Personal history of antineoplastic chemotherapy: Secondary | ICD-10-CM | POA: Diagnosis not present

## 2015-09-14 DIAGNOSIS — I10 Essential (primary) hypertension: Secondary | ICD-10-CM | POA: Diagnosis not present

## 2015-09-14 DIAGNOSIS — R682 Dry mouth, unspecified: Secondary | ICD-10-CM | POA: Diagnosis not present

## 2015-09-14 DIAGNOSIS — D89811 Chronic graft-versus-host disease: Secondary | ICD-10-CM | POA: Diagnosis not present

## 2015-09-14 DIAGNOSIS — E1165 Type 2 diabetes mellitus with hyperglycemia: Secondary | ICD-10-CM | POA: Diagnosis not present

## 2015-09-14 DIAGNOSIS — D89813 Graft-versus-host disease, unspecified: Secondary | ICD-10-CM | POA: Diagnosis not present

## 2015-09-15 DIAGNOSIS — D89811 Chronic graft-versus-host disease: Secondary | ICD-10-CM | POA: Diagnosis not present

## 2015-09-15 DIAGNOSIS — H04123 Dry eye syndrome of bilateral lacrimal glands: Secondary | ICD-10-CM | POA: Diagnosis not present

## 2015-09-15 DIAGNOSIS — H04129 Dry eye syndrome of unspecified lacrimal gland: Secondary | ICD-10-CM | POA: Diagnosis not present

## 2015-09-28 DIAGNOSIS — D89811 Chronic graft-versus-host disease: Secondary | ICD-10-CM | POA: Diagnosis not present

## 2015-09-28 DIAGNOSIS — Z9484 Stem cells transplant status: Secondary | ICD-10-CM | POA: Diagnosis not present

## 2015-09-28 DIAGNOSIS — C9101 Acute lymphoblastic leukemia, in remission: Secondary | ICD-10-CM | POA: Diagnosis not present

## 2015-09-28 DIAGNOSIS — E876 Hypokalemia: Secondary | ICD-10-CM | POA: Diagnosis not present

## 2015-09-28 DIAGNOSIS — J42 Unspecified chronic bronchitis: Secondary | ICD-10-CM | POA: Diagnosis not present

## 2015-09-28 DIAGNOSIS — Z86711 Personal history of pulmonary embolism: Secondary | ICD-10-CM | POA: Diagnosis not present

## 2015-09-28 DIAGNOSIS — I10 Essential (primary) hypertension: Secondary | ICD-10-CM | POA: Diagnosis not present

## 2015-09-28 DIAGNOSIS — Z794 Long term (current) use of insulin: Secondary | ICD-10-CM | POA: Diagnosis not present

## 2015-09-28 DIAGNOSIS — Z79899 Other long term (current) drug therapy: Secondary | ICD-10-CM | POA: Diagnosis not present

## 2015-09-28 DIAGNOSIS — E119 Type 2 diabetes mellitus without complications: Secondary | ICD-10-CM | POA: Diagnosis not present

## 2015-09-29 ENCOUNTER — Telehealth: Payer: Self-pay | Admitting: *Deleted

## 2015-09-29 NOTE — Telephone Encounter (Signed)
Pt left VM asking if she can have lab work done at our office next week for her MD at [facility]?  I called pt back and left message w/ her daughter to call us back.  Need to know if pt has Rx in hand for labs or is she asking for Dr. Alvy Bimler to order the labs?  If so, what labs and does pt have a PAC?

## 2015-09-30 ENCOUNTER — Telehealth: Payer: Self-pay | Admitting: Hematology and Oncology

## 2015-09-30 NOTE — Telephone Encounter (Signed)
PATIENT CALLED TO [facility] F/U APPT.

## 2015-10-01 ENCOUNTER — Other Ambulatory Visit: Payer: Self-pay

## 2015-10-01 MED ORDER — SITAGLIP PHOS-METFORMIN HCL ER 50-1000 MG PO TB24: 2.0000 | ORAL_TABLET | Freq: Every day | ORAL | 3 refills | Status: DC

## 2015-10-01 MED ORDER — PRAVASTATIN SODIUM 20 MG PO TABS: 20.0000 mg | ORAL_TABLET | Freq: Every day | ORAL | 0 refills | Status: DC

## 2015-10-03 ENCOUNTER — Other Ambulatory Visit: Payer: Self-pay | Admitting: Endocrinology

## 2015-10-05 ENCOUNTER — Telehealth: Payer: Self-pay | Admitting: Endocrinology

## 2015-10-05 NOTE — Telephone Encounter (Signed)
I have not seen any forms at this time.

## 2015-10-05 NOTE — Telephone Encounter (Signed)
PT called to see if there has been an Attending Physicians Statement sent over to our office from [facility] for Dr. Dwyane Dee?  She stated they told her they have sent some forms for Dr. Dwyane Dee to fill out.   PT Requests call back.

## 2015-10-06 ENCOUNTER — Other Ambulatory Visit: Payer: Self-pay | Admitting: Hematology and Oncology

## 2015-10-06 DIAGNOSIS — C9101 Acute lymphoblastic leukemia, in remission: Secondary | ICD-10-CM

## 2015-10-06 DIAGNOSIS — D89811 Chronic graft-versus-host disease: Secondary | ICD-10-CM

## 2015-10-07 ENCOUNTER — Ambulatory Visit (HOSPITAL_COMMUNITY)
Admission: RE | Admit: 2015-10-07 | Discharge: 2015-10-07 | Disposition: A | Discharge status: Home / Self Care | Payer: Medicare Other | Source: Ambulatory Visit | Attending: Hematology and Oncology | Admitting: Hematology and Oncology

## 2015-10-07 ENCOUNTER — Telehealth: Payer: Self-pay | Admitting: Hematology and Oncology

## 2015-10-07 ENCOUNTER — Telehealth: Payer: Self-pay | Admitting: *Deleted

## 2015-10-07 ENCOUNTER — Encounter: Payer: Self-pay | Admitting: Hematology and Oncology

## 2015-10-07 ENCOUNTER — Ambulatory Visit (HOSPITAL_BASED_OUTPATIENT_CLINIC_OR_DEPARTMENT_OTHER): Payer: Medicare Other

## 2015-10-07 ENCOUNTER — Ambulatory Visit (HOSPITAL_BASED_OUTPATIENT_CLINIC_OR_DEPARTMENT_OTHER): Payer: Medicare Other | Admitting: Hematology and Oncology

## 2015-10-07 VITALS — BP 126/84 | HR 114 | Temp 98.4°F | Resp 19 | Wt 220.4 lb

## 2015-10-07 DIAGNOSIS — IMO0002 Reserved for concepts with insufficient information to code with codable children: Secondary | ICD-10-CM

## 2015-10-07 DIAGNOSIS — Z9889 Other specified postprocedural states: Secondary | ICD-10-CM | POA: Insufficient documentation

## 2015-10-07 DIAGNOSIS — C9101 Acute lymphoblastic leukemia, in remission: Secondary | ICD-10-CM

## 2015-10-07 DIAGNOSIS — R509 Fever, unspecified: Secondary | ICD-10-CM

## 2015-10-07 DIAGNOSIS — Z794 Long term (current) use of insulin: Secondary | ICD-10-CM | POA: Diagnosis not present

## 2015-10-07 DIAGNOSIS — H538 Other visual disturbances: Secondary | ICD-10-CM

## 2015-10-07 DIAGNOSIS — Z9481 Bone marrow transplant status: Secondary | ICD-10-CM

## 2015-10-07 DIAGNOSIS — D89811 Chronic graft-versus-host disease: Secondary | ICD-10-CM

## 2015-10-07 DIAGNOSIS — J8489 Other specified interstitial pulmonary diseases: Secondary | ICD-10-CM

## 2015-10-07 DIAGNOSIS — E1165 Type 2 diabetes mellitus with hyperglycemia: Secondary | ICD-10-CM | POA: Diagnosis not present

## 2015-10-07 DIAGNOSIS — R918 Other nonspecific abnormal finding of lung field: Secondary | ICD-10-CM

## 2015-10-07 LAB — COMPREHENSIVE METABOLIC PANEL
ALBUMIN: 3.2 g/dL — AB (ref 3.5–5.0)
ALK PHOS: 152 U/L — AB (ref 40–150)
ALT: 50 U/L (ref 0–55)
ANION GAP: 11 meq/L (ref 3–11)
AST: 62 U/L — ABNORMAL HIGH (ref 5–34)
BUN: 15.4 mg/dL (ref 7.0–26.0)
CALCIUM: 9 mg/dL (ref 8.4–10.4)
CO2: 22 mEq/L (ref 22–29)
Chloride: 103 mEq/L (ref 98–109)
Creatinine: 1.7 mg/dL — ABNORMAL HIGH (ref 0.6–1.1)
EGFR: 41 mL/min/{1.73_m2} — AB (ref >90)
Glucose: 99 mg/dl (ref 70–140)
POTASSIUM: 3.8 meq/L (ref 3.5–5.1)
Sodium: 137 mEq/L (ref 136–145)
TOTAL PROTEIN: 6.8 g/dL (ref 6.4–8.3)

## 2015-10-07 LAB — CBC WITH DIFFERENTIAL/PLATELET
BASO%: 1 % (ref 0.0–2.0)
BASOS ABS: 0.1 10*3/uL (ref 0.0–0.1)
EOS ABS: 0 10*3/uL (ref 0.0–0.5)
EOS%: 0.1 % (ref 0.0–7.0)
HEMATOCRIT: 34.1 % — AB (ref 34.8–46.6)
HEMOGLOBIN: 11.4 g/dL — AB (ref 11.6–15.9)
LYMPH#: 1.7 10*3/uL (ref 0.9–3.3)
LYMPH%: 23.4 % (ref 14.0–49.7)
MCH: 30.8 pg (ref 25.1–34.0)
MCHC: 33.4 g/dL (ref 31.5–36.0)
MCV: 92.4 fL (ref 79.5–101.0)
MONO#: 0.8 10*3/uL (ref 0.1–0.9)
MONO%: 11.4 % (ref 0.0–14.0)
NEUT%: 64.1 % (ref 38.4–76.8)
NEUTROS ABS: 4.8 10*3/uL (ref 1.5–6.5)
PLATELETS: 130 10*3/uL — AB (ref 145–400)
RBC: 3.69 10*6/uL — ABNORMAL LOW (ref 3.70–5.45)
RDW: 14.4 % (ref 11.2–14.5)
WBC: 7.5 10*3/uL (ref 3.9–10.3)

## 2015-10-07 LAB — URINALYSIS, MICROSCOPIC - CHCC
GLUCOSE UR CHCC: NEGATIVE mg/dL
Ketones: NEGATIVE mg/dL
Nitrite: NEGATIVE
PH: 5 (ref 4.6–8.0)
Protein: 30 mg/dL
SPECIFIC GRAVITY, URINE: 1.025 (ref 1.003–1.035)
Urobilinogen, UR: 0.2 mg/dL (ref 0.2–1)

## 2015-10-07 LAB — MAGNESIUM: MAGNESIUM: 1.4 mg/dL — AB (ref 1.5–2.5)

## 2015-10-07 NOTE — Assessment & Plan Note (Signed)
Her eye examination today looks benign. No signs of infection is noted. Her blurry vision could be either related to subtle graft-versus-host disease or fluctuation of her blood sugar related to recent prednisone taper. She has an appointment to see an ophthalmologist and I would defer to them for further evaluation and management

## 2015-10-07 NOTE — Assessment & Plan Note (Signed)
She has poorly controlled diabetes. She is currently on a prednisone taper and hopefully that will improve her diabetes control. She will continue insulin as directed

## 2015-10-07 NOTE — Progress Notes (Signed)
[place] OFFICE PROGRESS NOTE  Patient Care Team: L.Donnie Coffin, MD as PCP - General (Family Medicine) Garry Heater, DO as Referring Physician (Hematology)  SUMMARY OF ONCOLOGIC HISTORY:  Lynn [name] was transferred to my care after her prior physician has left.  I reviewed the patient's records extensive and collaborated the history with the patient. Summary of her history is as follows: The patient was diagnosed with precursor B-cell ALL, Philadelphia chromosome positive. She initially presented in 2013 with leg swelling and was found to have pulmonary emboli. In the course of workup, she was found to have ALL. Her anticoagulation therapy was discontinued when she developed complication during chemotherapy with subdural hematoma requiring placement of drainage. She received autologous stem cell transplant in October 2013 and had relapse. Subsequently, on 02/06/2013, she has unrelated allogeneic stem cell transplant.  Her post transplant course was complicated by C. Difficile positive diarrhea treated with oral vancomycin, pneumonia likely bacterial.  CXR with new patchy opacities treated with zosyn and moxifloxacin and acute kidney injury secondary to dehydration from diarrhea and ATN which improved with hydration.   Recently she has recurrence of pneumonia. CT angiogram on 12/02/2013 showed no evidence of PE. Dose of her prednisone was increased and was placed on azithromycin for chronic suppressive therapy. 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 She was noted to have elevated liver enzymes and liver biopsy last week, suspicious for graft-versus-host disease involving the liver she was placed on high-dose prednisone therapy. Recently, with new data available, the patient was placed on Ibrutinib for GVHD.  INTERVAL HISTORY: Please see below for problem oriented charting. She feels unwell. She has some low-grade fever today  with chills. Yesterday, her temperature was 102. There were no localizing signs for infection. She has mild reduced appetite. She complained of profound fatigue since she was placed on prednisone taper. She was started on Ibrutinib and her prednisone is currently tapered to 10 mg per day. She continues to have poorly controlled diabetes. She complained of bilateral eyes irritation with blurriness of vision and photosensitivity. She denies focal neurological deficit. She had mild headache today which resolved with Motrin. She has lost 8 pounds of weight in 1 month. She denies sinus congestion, nasal drainage, sore throat, dysuria, frequency or urgency or diarrhea. Her bowel movement was loose 2 days ago. She denies bone pain. No recent cough She denies skin rashes  REVIEW OF SYSTEMS:   Ears, nose, mouth, throat, and face: Denies mucositis or sore throat Respiratory: Denies cough, dyspnea or wheezes Cardiovascular: Denies palpitation, chest discomfort or lower extremity swelling Skin: Denies abnormal skin rashes Lymphatics: Denies new lymphadenopathy or easy bruising Neurological:Denies numbness, tingling or new weaknesses Behavioral/Psych: Mood is stable, no new changes  All other systems were reviewed with the patient and are negative.  I have reviewed the past medical history, past surgical history, social history and family history with the patient and they are unchanged from previous note.  ALLERGIES:  is allergic to other.  MEDICATIONS:  Current Outpatient Prescriptions  Medication Sig Dispense Refill  . ACCU-CHEK AVIVA PLUS test strip USE TO TEST BLOOD GLUCOSE 2 TIMES DAILY  5  . acyclovir (ZOVIRAX) 800 MG tablet Take 1 tablet (800 mg total) by mouth 2 (two) times daily. . 60 tablet 5  . ADVAIR DISKUS 250-50 MCG/DOSE AEPB INHALE 1 PUFF INTO THE LUNGS EVERY 12 HOURS  5  . amitriptyline (ELAVIL) 10 MG tablet Take 2 tablets (20 mg  total) by mouth at bedtime. 180 tablet 5  . cetirizine  (ZYRTEC) 10 MG tablet Take 1 tablet (10 mg total) by mouth daily. (Patient taking differently: Take 10 mg by mouth daily as needed. ) 30 tablet 0  . Elastic Bandages & Supports (MEDICAL COMPRESSION SOCKS) MISC Please dispense one pair. 2 each 0  . fluconazole (DIFLUCAN) 200 MG tablet     . folic acid (FOLVITE) 536 MCG tablet Take 400 mcg by mouth daily.    Marland Kitchen gabapentin (NEURONTIN) 300 MG capsule TAKE ONE CAPSULE BY MOUTH THREE TIMES DAILY 90 capsule 5  . hydrochlorothiazide (MICROZIDE) 12.5 MG capsule Take 12.5 mg by mouth daily.    . Insulin Glargine (LANTUS) 100 UNIT/ML Solostar Pen Inject 12 units everymore, can use sliding scale (Patient taking differently: 8-10 Units. can use sliding scale) 15 mL 3  . LORazepam (ATIVAN) 1 MG tablet Take 1 mg by mouth as needed.     . magnesium chloride (SLOW-MAG) 64 MG TBEC SR tablet Take 2 tablets by mouth 3 (three) times daily.     . montelukast (SINGULAIR) 5 MG chewable tablet Chew 5 mg by mouth at bedtime.    . Multiple Vitamin (MULTIVITAMIN) tablet Take 1 tablet by mouth daily.    . mycophenolate (CELLCEPT) 250 MG capsule Take 250 mg by mouth.    Marland Kitchen NOVOFINE PLUS 32G X 4 MM MISC     . NOVOLOG FLEXPEN 100 UNIT/ML FlexPen INJECT 16 UNITS WITH EACH MEAL PLUS 18-20 EXTRA UNITS IF SUGAR IS OVER 250 30 mL 3  . ondansetron (ZOFRAN ODT) 4 MG disintegrating tablet Take 1 tablet (4 mg total) by mouth every 8 (eight) hours as needed for nausea. 30 tablet 1  . potassium chloride SA (K-DUR,KLOR-CON) 20 MEQ tablet TAKE 1 TABLET (20 MEQ TOTAL) BY MOUTH DAILY. 30 tablet 2  . pravastatin (PRAVACHOL) 20 MG tablet Take 1 tablet (20 mg total) by mouth daily. 90 tablet 0  . predniSONE (DELTASONE) 10 MG tablet Take 10 mg by mouth daily with breakfast.    . prochlorperazine (COMPAZINE) 5 MG tablet Take 5 mg by mouth as needed.    . RESTASIS 0.05 % ophthalmic emulsion PLACE 1 DROP INTO BOTH EYES 2 TIMES DAILY.  11  . Sirolimus 0.5 MG TABS Take 0.5 mg by mouth daily.  2  .  SitaGLIPtin-MetFORMIN HCl (JANUMET XR) 50-1000 MG TB24 Take 2 tablets by mouth daily. 180 tablet 3  . sulfamethoxazole-trimethoprim (BACTRIM DS,SEPTRA DS) 800-160 MG per tablet Take 1 tablet by mouth 3 (three) times a week. Monday,Wednesday, and Friday 30 tablet 0  . SUMAtriptan (IMITREX) 20 MG/ACT nasal spray Place 1 spray (20 mg total) into the nose once. May repeat in 2 hours if headache persists or recurs. 10 Inhaler 6  . traMADol (ULTRAM) 50 MG tablet Take 1 tablet (50 mg total) by mouth every 6 (six) hours as needed for moderate pain. 30 tablet 0  . IMBRUVICA 140 MG capsul Take 280 mg by mouth daily.     No current facility-administered medications for this visit.     PHYSICAL EXAMINATION: ECOG PERFORMANCE STATUS: 1 - Symptomatic but completely ambulatory  Vitals:   10/07/15 0915  BP: 126/84  Pulse: (!) 114  Resp: 19  Temp: 98.4 F (36.9 C)   Filed Weights   10/07/15 0915  Weight: 220 lb 6.4 oz (100 kg)    GENERAL:alert, no distress and comfortable. She is obese SKIN: skin color, texture, turgor are normal, no rashes or significant  lesions EYES: normal, Conjunctiva are pink and non-injected, sclera clear OROPHARYNX:no exudate, no erythema and lips, buccal mucosa, and tongue normal  NECK: supple, thyroid normal size, non-tender, without nodularity LYMPH:  no palpable lymphadenopathy in the cervical, axillary or inguinal LUNGS: clear to auscultation and percussion with normal breathing effort HEART: regular rate & rhythm and no murmurs and no lower extremity edema ABDOMEN:abdomen soft, non-tender and normal bowel sounds Musculoskeletal:no cyanosis of digits and no clubbing  NEURO: alert & oriented x 3 with fluent speech, no focal motor/sensory deficits  LABORATORY DATA:  I have reviewed the data as listed    Component Value Date/Time   NA 138 09/07/2015 0852   NA 143 05/20/2014 1102   K 3.6 09/07/2015 0852   K 4.5 05/20/2014 1102   CL 103 09/07/2015 0852   CL 106  07/09/2012 0949   CO2 25 09/07/2015 0852   CO2 23 05/20/2014 1102   GLUCOSE 110 (H) 09/07/2015 0852   GLUCOSE 135 05/20/2014 1102   GLUCOSE 109 (H) 07/09/2012 0949   BUN 16 09/07/2015 0852   BUN 17.1 05/20/2014 1102   CREATININE 1.69 (H) 09/07/2015 0852   CREATININE 1.3 (H) 05/20/2014 1102   CALCIUM 9.0 09/07/2015 0852   CALCIUM 9.1 05/20/2014 1102   PROT 6.6 09/07/2015 0852   PROT 6.2 (L) 05/20/2014 1102   ALBUMIN 3.8 09/07/2015 0852   ALBUMIN 3.4 (L) 05/20/2014 1102   AST 28 09/07/2015 0852   AST 33 05/20/2014 1102   ALT 20 09/07/2015 0852   ALT 36 05/20/2014 1102   ALKPHOS 101 09/07/2015 0852   ALKPHOS 142 05/20/2014 1102   BILITOT 0.2 09/07/2015 0852   BILITOT 0.41 05/20/2014 1102   GFRNONAA 36 (L) 05/13/2015 1507   GFRAA 41 (L) 05/13/2015 1507    No results found for: SPEP, UPEP  Lab Results  Component Value Date   WBC 6.5 05/13/2015   NEUTROABS 1.6 05/20/2014   HGB 11.4 (L) 05/13/2015   HCT 35.0 (L) 05/13/2015   MCV 94.3 05/13/2015   PLT 252 05/13/2015      Chemistry      Component Value Date/Time   NA 138 09/07/2015 0852   NA 143 05/20/2014 1102   K 3.6 09/07/2015 0852   K 4.5 05/20/2014 1102   CL 103 09/07/2015 0852   CL 106 07/09/2012 0949   CO2 25 09/07/2015 0852   CO2 23 05/20/2014 1102   BUN 16 09/07/2015 0852   BUN 17.1 05/20/2014 1102   CREATININE 1.69 (H) 09/07/2015 0852   CREATININE 1.3 (H) 05/20/2014 1102      Component Value Date/Time   CALCIUM 9.0 09/07/2015 0852   CALCIUM 9.1 05/20/2014 1102   ALKPHOS 101 09/07/2015 0852   ALKPHOS 142 05/20/2014 1102   AST 28 09/07/2015 0852   AST 33 05/20/2014 1102   ALT 20 09/07/2015 0852   ALT 36 05/20/2014 1102   BILITOT 0.2 09/07/2015 0852   BILITOT 0.41 05/20/2014 1102     ASSESSMENT & PLAN:  Acute lymphoblastic leukemia in remission Clinically, she has no signs of recurrence of disease. She has appointment to see her transplant physician next week In the meantime, I plan to see her  periodically for supportive care I will get some blood work drawn per request by her transplant physician today, along with workup for recent fever  S/P allogeneic bone marrow transplant The patient have significant post transplant complications including steroid dependency causing uncontrolled diabetes, recurrent infection, BOOP and others. I will continue  supportive care. She will continue antimicrobial therapy and anti-rejection medicine as directed by her transplant physician  Fever She had recent fever without any localizing signs. CT fever resolves spontaneously. Examination showed no focal signs of infection. I will order urine culture, blood culture and chest x-ray  BOOP (bronchiolitis obliterans with organizing pneumonia) Her examination  is normal. She has mild tachycardia. I will order chest x-ray for further evaluation   Chronic graft-versus-host disease The patient will continue Ibrutinib as directed  Insulin dependent type 2 diabetes mellitus, uncontrolled ([place]) She has poorly controlled diabetes. She is currently on a prednisone taper and hopefully that will improve her diabetes control. She will continue insulin as directed  Blurry vision, bilateral Her eye examination today looks benign. No signs of infection is noted. Her blurry vision could be either related to subtle graft-versus-host disease or fluctuation of her blood sugar related to recent prednisone taper. She has an appointment to see an ophthalmologist and I would defer to them for further evaluation and management   Orders Placed This Encounter  Procedures  . Urine culture    Standing Status:   Future    Standing Expiration Date:   11/10/2016  . Culture, blood (single) w Reflex to ID Panel    Standing Status:   Future    Standing Expiration Date:   11/10/2016  . DG Chest 2 View    Standing Status:   Future    Standing Expiration Date:   11/10/2016    Order Specific Question:   Reason for exam:     Answer:   Hx BOOP, transplant, fever, exclude pneumonia    Order Specific Question:   Preferred imaging location?    Answer:   [facility]  . BCR-[id number] FISH    BCR/ABL 210    Standing Status:   Future    Standing Expiration Date:   11/10/2016  . Urinalysis, Microscopic - [facility]    Standing Status:   Future    Standing Expiration Date:   11/10/2016   All questions were answered. The patient knows to call the clinic with any problems, questions or concerns. No barriers to learning was detected. I spent 25 minutes counseling the patient face to face. The total time spent in the appointment was 40 minutes and more than 50% was on counseling and review of test results     [facility], [place], MD 10/07/2015 10:13 AM

## 2015-10-07 NOTE — Telephone Encounter (Signed)
PATIENT DECLINED AVS REPORT. LAB ADD ON PER 10/07/15 LOS.

## 2015-10-07 NOTE — Assessment & Plan Note (Signed)
Her examination  is normal. She has mild tachycardia. I will order chest x-ray for further evaluation

## 2015-10-07 NOTE — Assessment & Plan Note (Signed)
She had recent fever without any localizing signs. CT fever resolves spontaneously. Examination showed no focal signs of infection. I will order urine culture, blood culture and chest x-ray

## 2015-10-07 NOTE — Telephone Encounter (Signed)
Informed pt per Dr. Alvy Bimler of lab results.   1. Creatinine elevated at 1.7.  Increase oral fluid intake and Hold HCTZ until seen by transplant MD at [facility] again next week on 9/12.    2. U/A does not show definite infection.  Dr. Alvy Bimler will wait for culture results and we will call pt if she needs antibiotic. 3.  Magnesium is low.  Confirmed w/ pt she is taking Magnesium, but she says it is not Prescription,  Just OTC.  She says she is scheduled for IV Magnesium at [facility] next week if she needs it.  They will recheck her labs next week.  She will also see if they want her to take Rx Magnesium again vs. OTC.    Pt verbalized understanding of results and Dr. Calton Dach instructions.

## 2015-10-07 NOTE — Assessment & Plan Note (Signed)
Clinically, she has no signs of recurrence of disease. She has appointment to see her transplant physician next week In the meantime, I plan to see her periodically for supportive care I will get some blood work drawn per request by her transplant physician today, along with workup for recent fever

## 2015-10-07 NOTE — Assessment & Plan Note (Signed)
The patient will continue Ibrutinib as directed

## 2015-10-07 NOTE — Assessment & Plan Note (Signed)
The patient have significant post transplant complications including steroid dependency causing uncontrolled diabetes, recurrent infection, BOOP and others. I will continue supportive care. She will continue antimicrobial therapy and anti-rejection medicine as directed by her transplant physician

## 2015-10-08 LAB — URINE CULTURE

## 2015-10-10 ENCOUNTER — Emergency Department (HOSPITAL_COMMUNITY)
Admission: EM | Admit: 2015-10-10 | Discharge: 2015-10-10 | Disposition: A | Discharge status: Home / Self Care | Payer: Medicare Other | Attending: Emergency Medicine | Admitting: Emergency Medicine

## 2015-10-10 ENCOUNTER — Encounter (HOSPITAL_COMMUNITY): Payer: Self-pay | Admitting: Emergency Medicine

## 2015-10-10 ENCOUNTER — Emergency Department (HOSPITAL_COMMUNITY): Payer: Medicare Other

## 2015-10-10 ENCOUNTER — Other Ambulatory Visit: Payer: Self-pay

## 2015-10-10 DIAGNOSIS — J45909 Unspecified asthma, uncomplicated: Secondary | ICD-10-CM | POA: Insufficient documentation

## 2015-10-10 DIAGNOSIS — Z79899 Other long term (current) drug therapy: Secondary | ICD-10-CM | POA: Diagnosis not present

## 2015-10-10 DIAGNOSIS — D849 Immunodeficiency, unspecified: Secondary | ICD-10-CM | POA: Insufficient documentation

## 2015-10-10 DIAGNOSIS — Z794 Long term (current) use of insulin: Secondary | ICD-10-CM | POA: Insufficient documentation

## 2015-10-10 DIAGNOSIS — I10 Essential (primary) hypertension: Secondary | ICD-10-CM | POA: Insufficient documentation

## 2015-10-10 DIAGNOSIS — E119 Type 2 diabetes mellitus without complications: Secondary | ICD-10-CM | POA: Insufficient documentation

## 2015-10-10 DIAGNOSIS — D899 Disorder involving the immune mechanism, unspecified: Secondary | ICD-10-CM

## 2015-10-10 DIAGNOSIS — Z7951 Long term (current) use of inhaled steroids: Secondary | ICD-10-CM | POA: Diagnosis not present

## 2015-10-10 DIAGNOSIS — R509 Fever, unspecified: Secondary | ICD-10-CM

## 2015-10-10 LAB — URINALYSIS, ROUTINE W REFLEX MICROSCOPIC
Bilirubin Urine: NEGATIVE
GLUCOSE, UA: NEGATIVE mg/dL
HGB URINE DIPSTICK: NEGATIVE
Ketones, ur: NEGATIVE mg/dL
Nitrite: NEGATIVE
PH: 5.5 (ref 5.0–8.0)
Protein, ur: 30 mg/dL — AB
Specific Gravity, Urine: 1.014 (ref 1.005–1.030)

## 2015-10-10 LAB — RESPIRATORY PANEL BY PCR
ADENOVIRUS-RVPPCR: NOT DETECTED
Bordetella pertussis: NOT DETECTED
CHLAMYDOPHILA PNEUMONIAE-RVPPCR: NOT DETECTED
CORONAVIRUS HKU1-RVPPCR: NOT DETECTED
CORONAVIRUS NL63-RVPPCR: NOT DETECTED
Coronavirus 229E: NOT DETECTED
Coronavirus OC43: NOT DETECTED
INFLUENZA A-RVPPCR: NOT DETECTED
Influenza B: NOT DETECTED
MYCOPLASMA PNEUMONIAE-RVPPCR: NOT DETECTED
Metapneumovirus: NOT DETECTED
PARAINFLUENZA VIRUS 4-RVPPCR: NOT DETECTED
Parainfluenza Virus 1: NOT DETECTED
Parainfluenza Virus 2: NOT DETECTED
Parainfluenza Virus 3: NOT DETECTED
Respiratory Syncytial Virus: NOT DETECTED
Rhinovirus / Enterovirus: NOT DETECTED

## 2015-10-10 LAB — CBC WITH DIFFERENTIAL/PLATELET
BASOS PCT: 0 %
Basophils Absolute: 0 10*3/uL (ref 0.0–0.1)
EOS PCT: 0 %
Eosinophils Absolute: 0 10*3/uL (ref 0.0–0.7)
HEMATOCRIT: 29.8 % — AB (ref 36.0–46.0)
Hemoglobin: 10 g/dL — ABNORMAL LOW (ref 12.0–15.0)
LYMPHS ABS: 1.7 10*3/uL (ref 0.7–4.0)
Lymphocytes Relative: 15 %
MCH: 30.9 pg (ref 26.0–34.0)
MCHC: 33.6 g/dL (ref 30.0–36.0)
MCV: 92 fL (ref 78.0–100.0)
MONOS PCT: 18 %
Monocytes Absolute: 2 10*3/uL — ABNORMAL HIGH (ref 0.1–1.0)
Neutro Abs: 7.3 10*3/uL (ref 1.7–7.7)
Neutrophils Relative %: 67 %
Platelets: 98 10*3/uL — ABNORMAL LOW (ref 150–400)
RBC: 3.24 MIL/uL — AB (ref 3.87–5.11)
RDW: 14.3 % (ref 11.5–15.5)
WBC: 11 10*3/uL — AB (ref 4.0–10.5)

## 2015-10-10 LAB — COMPREHENSIVE METABOLIC PANEL
ALBUMIN: 3.1 g/dL — AB (ref 3.5–5.0)
ALT: 32 U/L (ref 14–54)
AST: 36 U/L (ref 15–41)
Alkaline Phosphatase: 118 U/L (ref 38–126)
Anion gap: 12 (ref 5–15)
BUN: 16 mg/dL (ref 6–20)
CHLORIDE: 99 mmol/L — AB (ref 101–111)
CO2: 23 mmol/L (ref 22–32)
CREATININE: 1.68 mg/dL — AB (ref 0.44–1.00)
Calcium: 8.2 mg/dL — ABNORMAL LOW (ref 8.9–10.3)
GFR calc Af Amer: 40 mL/min — ABNORMAL LOW (ref >60)
GFR, EST NON AFRICAN AMERICAN: 34 mL/min — AB (ref >60)
Glucose, Bld: 141 mg/dL — ABNORMAL HIGH (ref 65–99)
POTASSIUM: 3.7 mmol/L (ref 3.5–5.1)
SODIUM: 134 mmol/L — AB (ref 135–145)
Total Bilirubin: 0.6 mg/dL (ref 0.3–1.2)
Total Protein: 6.5 g/dL (ref 6.5–8.1)

## 2015-10-10 LAB — I-STAT CG4 LACTIC ACID, ED
LACTIC ACID, VENOUS: 0.95 mmol/L (ref 0.5–1.9)
Lactic Acid, Venous: 0.68 mmol/L (ref 0.5–1.9)

## 2015-10-10 LAB — URINE MICROSCOPIC-ADD ON: RBC / HPF: NONE SEEN RBC/hpf (ref 0–5)

## 2015-10-10 LAB — MAGNESIUM: MAGNESIUM: 1.2 mg/dL — AB (ref 1.7–2.4)

## 2015-10-10 MED ORDER — SODIUM CHLORIDE 0.9 % IV BOLUS (SEPSIS)
30.0000 mL/kg | Freq: Once | INTRAVENOUS | Status: AC
  Administered 2015-10-10: 3000 mL via INTRAVENOUS

## 2015-10-10 MED ORDER — HEPARIN SOD (PORK) LOCK FLUSH 100 UNIT/ML IV SOLN
500.0000 [IU] | Freq: Once | INTRAVENOUS | Status: AC
  Administered 2015-10-10: 500 [IU]
  Filled 2015-10-10: qty 5

## 2015-10-10 MED ORDER — ACETAMINOPHEN 500 MG PO TABS
1000.0000 mg | ORAL_TABLET | Freq: Once | ORAL | Status: AC
  Administered 2015-10-10: 1000 mg via ORAL
  Filled 2015-10-10: qty 2

## 2015-10-10 NOTE — ED Provider Notes (Signed)
[facility] DEPT Provider Note   CSN: [id number] Arrival date & time: 10/10/15  [id number]     History   Chief Complaint Chief Complaint  Patient presents with  . Fever    HPI Lynn [name] is a 50 y.o. female who presents to the ED with cc of fever. She has a pmh of ALL in remission. 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 She is on prograf, prednisone which has been tapered down, and just started on Imbruvica for her GVHD.The patient has been running a fever for about a week. She saw Dr. Alvy Bimler on 9/7 and had UA, Blood clutures and mag level checked. Her magnesium was low. She has a follow up appointment with her transplant physician, Dr. Maudie Mercury at [facility] in 2 days. The patient states that she has chronic dry eyes and photophobia, oral mucositis and "problems with my lungs." She denies abdominal pain. She's had some nausea and vomited once this weekend. She denies dysuria, hematuria or foul odor, but she did notice that her urine was very dark this morning. She denies cough, chills, myalgias. HPI  Past Medical History:  Diagnosis Date  . ALL (acute lymphoblastic leukemia) ([place])   . Anemia   . Arthritis   . Brain bleed ([place])    07/04/11  . Diabetes mellitus without complication ([place])   . Headache(784.0)   . Hypertension   . Leukemia ([place])   . Pneumonia   . Pulmonary embolism ([place])    06/22/11  . Thrush of mouth and esophagus ([place]) 04/09/2014    Patient Active Problem List   Diagnosis Date Noted  . Blurry vision, bilateral 10/07/2015  . Chronic headaches 05/06/2015  . Dehydration 05/21/2014  . Prerenal renal failure 05/21/2014  . Thrush of mouth and esophagus ([place]) 04/09/2014  . Chronic graft-versus-host disease ([place]) 04/09/2014  . Leukopenia 01/08/2014  . S/P allogeneic bone marrow transplant ([place]) 01/08/2014  . Asthma, chronic 12/13/2013  . Pulmonary infiltrates 12/13/2013  . Cryptogenic organizing pneumonia 08/27/2013  . BOOP  (bronchiolitis obliterans with organizing pneumonia) ([place]) 08/08/2013  . Insulin dependent type 2 diabetes mellitus, uncontrolled ([place]) 07/30/2013  . HCAP (healthcare-associated pneumonia) 06/10/2013  . History of pulmonary embolism 01/11/2013  . Greenfield filter in place 01/11/2013  . Port-a-cath in place 01/09/2013  . Right thyroid nodule 07/09/2012  . Fever 05/13/2012  . History of peripheral stem cell transplant ([place]) 01/12/2012  . Transfusion reaction 11/27/2011  . Acute lymphoblastic leukemia in remission ([place]) 08/21/2011  . Subdural hemorrhage ([place]) 07/26/2011  . Essential hypertension 07/06/2011  . Thrombocytopenia ([place]) 06/17/2011  . Anemia in neoplastic disease 06/17/2011  . Hilar lymphadenopathy 06/17/2011    Past Surgical History:  Procedure Laterality Date  . BONE MARROW TRANSPLANT    . CESAREAN SECTION  2000  . CRANIOTOMY  07/04/11  . EYE SURGERY Right 2005   repair crossed eye  . FOOT SURGERY    . greenfield filter    . LUNG BIOPSY  06/19/13    OB History    Gravida Para Term Preterm AB Living   2 2 2     2    SAB TAB Ectopic Multiple Live Births                   Home Medications    Prior to Admission medications   Medication Sig Start Date End Date Taking? Authorizing Provider  ACCU-CHEK AVIVA PLUS test strip USE TO TEST BLOOD GLUCOSE 2 TIMES DAILY 10/23/14  Historical Provider, MD  acyclovir (ZOVIRAX) 800 MG tablet Take 1 tablet (800 mg total) by mouth 2 (two) times daily. . 08/27/13   Concha Norway, MD  ADVAIR DISKUS 250-50 MCG/DOSE AEPB INHALE 1 PUFF INTO THE LUNGS EVERY 12 HOURS 01/18/15   Historical Provider, MD  amitriptyline (ELAVIL) 10 MG tablet Take 2 tablets (20 mg total) by mouth at bedtime. 02/04/15   Melvenia Beam, MD  cetirizine (ZYRTEC) 10 MG tablet Take 1 tablet (10 mg total) by mouth daily. Patient taking differently: Take 10 mg by mouth daily as needed.  03/07/14   Melony Overly, MD  Elastic Bandages & Supports (MEDICAL COMPRESSION SOCKS)  MISC Please dispense one pair. 05/13/15   Etta Quill, NP  fluconazole (DIFLUCAN) 200 MG tablet  05/03/15   Historical Provider, MD  folic acid (FOLVITE) A999333 MCG tablet Take 400 mcg by mouth daily.    Historical Provider, MD  gabapentin (NEURONTIN) 300 MG capsule TAKE ONE CAPSULE BY MOUTH THREE TIMES DAILY 05/10/15   Melvenia Beam, MD  hydrochlorothiazide (MICROZIDE) 12.5 MG capsule Take 12.5 mg by mouth daily.    Historical Provider, MD  IMBRUVICA 140 MG capsul Take 280 mg by mouth daily. 09/28/15   Historical Provider, MD  Insulin Glargine (LANTUS) 100 UNIT/ML Solostar Pen Inject 12 units everymore, can use sliding scale Patient taking differently: 8-10 Units. can use sliding scale 11/16/14   Elayne Snare, MD  LORazepam (ATIVAN) 1 MG tablet Take 1 mg by mouth as needed.  02/06/14   Historical Provider, MD  magnesium chloride (SLOW-MAG) 64 MG TBEC SR tablet Take 2 tablets by mouth 3 (three) times daily.  11/29/12   Historical Provider, MD  montelukast (SINGULAIR) 5 MG chewable tablet Chew 5 mg by mouth at bedtime.    Historical Provider, MD  Multiple Vitamin (MULTIVITAMIN) tablet Take 1 tablet by mouth daily.    Historical Provider, MD  mycophenolate (CELLCEPT) 250 MG capsule Take 250 mg by mouth. 05/18/15 05/17/16  Historical Provider, MD  NOVOFINE PLUS 32G X 4 MM MISC  05/31/14   Historical Provider, MD  NOVOLOG FLEXPEN 100 UNIT/ML FlexPen INJECT 16 UNITS WITH EACH MEAL PLUS 18-20 EXTRA UNITS IF SUGAR IS OVER 250 09/06/15   Elayne Snare, MD  ondansetron (ZOFRAN ODT) 4 MG disintegrating tablet Take 1 tablet (4 mg total) by mouth every 8 (eight) hours as needed for nausea. 09/24/13   Concha Norway, MD  potassium chloride SA (K-DUR,KLOR-CON) 20 MEQ tablet TAKE 1 TABLET (20 MEQ TOTAL) BY MOUTH DAILY. 10/05/15   Elayne Snare, MD  pravastatin (PRAVACHOL) 20 MG tablet Take 1 tablet (20 mg total) by mouth daily. 10/01/15   Elayne Snare, MD  predniSONE (DELTASONE) 10 MG tablet Take 10 mg by mouth daily with breakfast.     Historical Provider, MD  prochlorperazine (COMPAZINE) 5 MG tablet Take 5 mg by mouth as needed. 10/12/14   Historical Provider, MD  RESTASIS 0.05 % ophthalmic emulsion PLACE 1 DROP INTO BOTH EYES 2 TIMES DAILY. 08/19/15   Historical Provider, MD  Sirolimus 0.5 MG TABS Take 0.5 mg by mouth daily. 12/31/14   Historical Provider, MD  SitaGLIPtin-MetFORMIN HCl (JANUMET XR) 50-1000 MG TB24 Take 2 tablets by mouth daily. 10/01/15   Elayne Snare, MD  sulfamethoxazole-trimethoprim (BACTRIM DS,SEPTRA DS) 800-160 MG per tablet Take 1 tablet by mouth 3 (three) times a week. Monday,Wednesday, and Friday 06/19/14   Heath Lark, MD  SUMAtriptan (IMITREX) 20 MG/ACT nasal spray Place 1 spray (20 mg total) into  the nose once. May repeat in 2 hours if headache persists or recurs. 02/04/15   Melvenia Beam, MD  traMADol (ULTRAM) 50 MG tablet Take 1 tablet (50 mg total) by mouth every 6 (six) hours as needed for moderate pain. 09/24/13   Concha Norway, MD    Family History Family History  Problem Relation Age of Onset  . Pancreatic cancer Father   . Cancer Mother     colon  . Pancreatic cancer Mother   . Asthma Son   . Hypertension Sister   . Diabetes Neg Hx   . Heart disease Neg Hx     Social History Social History  Substance Use Topics  . Smoking status: Never Smoker  . Smokeless tobacco: Never Used  . Alcohol use No     Allergies   Other   Review of Systems Review of Systems  Ten systems reviewed and are negative for acute change, except as noted in the HPI.    Physical Exam Updated Vital Signs BP 113/76 (BP Location: Right Arm)   Pulse (!) 126   Temp 100.3 F (37.9 C) (Oral)   Resp 22   Ht 5\' 7"  (1.702 m)   LMP 08/09/2011   SpO2 99%   Physical Exam  Constitutional: She is oriented to person, place, and time. She appears well-developed and well-nourished. No distress.  HENT:  Head: Normocephalic and atraumatic.  No oral lesions  Eyes: Conjunctivae and EOM are normal. Pupils are equal,  round, and reactive to light. No scleral icterus.  Patient keeps eyes closed.  Neck: Normal range of motion. Neck supple. No JVD present.  Cardiovascular: Normal rate, regular rhythm and normal heart sounds.  Exam reveals no gallop and no friction rub.   No murmur heard. BL 1+ pitting edema  Pulmonary/Chest: Effort normal and breath sounds normal. No respiratory distress. She has no wheezes.  Abdominal: Soft. Bowel sounds are normal. She exhibits no distension and no mass. There is no tenderness. There is no guarding.  Musculoskeletal: Normal range of motion.  Neurological: She is alert and oriented to person, place, and time.  Skin: Skin is warm and dry. She is not diaphoretic.  Nursing note and vitals reviewed.    ED Treatments / Results  Labs (all labs ordered are listed, but only abnormal results are displayed) Labs Reviewed  MAGNESIUM  COMPREHENSIVE METABOLIC PANEL  CBC WITH DIFFERENTIAL/PLATELET  URINALYSIS, ROUTINE W REFLEX MICROSCOPIC (NOT AT [facility])  I-STAT CG4 LACTIC ACID, ED    EKG  EKG Interpretation None       Radiology No results found.  Procedures Procedures (including critical care time)  Medications Ordered in ED Medications  sodium chloride 0.9 % bolus 3,000 mL (3,000 mLs Intravenous New Bag/Given 10/10/15 0809)     Initial Impression / Assessment and Plan / ED Course  I have reviewed the triage vital signs and the nursing notes.  Pertinent labs & imaging results that were available during my care of the patient were reviewed by me and considered in my medical decision making (see chart for details).  Clinical Course  Value Comment By Time   Patient with immunocompromise and cGVHD. Patient is febrile and tachycardic, but she is well appearing. I will not redraw blood cultures as this was done on 9/7. Will recheck her mag level. She states that she always gets tachycardic when she is tapered from prednisone. Margarita Mail, PA-C 09/10 [id number]  Pulse  Rate: (!) 126 (Reviewed) Margarita Mail, PA-C 09/10 [phone]  Patient with persistent FOU. Treated with tylenol. Patient states she is feeling less cold and achy. The patient has negative blood cultures. UA negative.  Margarita Mail, PA-C 09/10 1057   He cxr is negative. Renal insufficiency is at [place]. She has had a near 1 g drop in her hgb over the past 3 days and her platelets have also dropped. I will consult with her transplant oncologist at [facility], PA-C 09/10 1058  Magnesium: (!) 1.2 Magnesium is still low. Margarita Mail, PA-C 09/10 1100      I spoke with Dr. Cassell Clement , on call for her transplant oncology at [facility]. She asked that we repeat blood culture and urine culture on the patient as well . Respiratory viral panel. Patient is to return to the emergency room for any new concerns, worsening problems. Her doctor states that she may treat her fever symptomatically over the next 2 days. I discussed the findings with the patient. She is follow-up with her oncologist on the 12th. At [facility]. She appears safe for discharge at this time Final Clinical Impressions(s) / ED Diagnoses   Final diagnoses:  None    New Prescriptions New Prescriptions   No medications on file     Deneene Gurtner, PA-C 10/10/15 1237    Isla Pence, MD 10/10/15 1315

## 2015-10-10 NOTE — ED Notes (Signed)
Patient transported to X-ray 

## 2015-10-10 NOTE — ED Triage Notes (Signed)
Pt denies cough but c/o DOE.

## 2015-10-10 NOTE — ED Triage Notes (Signed)
Pt states she has had fever for several days, max 103 at home. Pt c/o of feeling dehydrated, dark urine, generalized pain. Pt seen by oncology 10/07/2015 and patient had blood / urine cultures and cxr.

## 2015-10-10 NOTE — ED Notes (Signed)
PT have been made aware of urine sample 

## 2015-10-12 ENCOUNTER — Encounter: Payer: Self-pay | Admitting: *Deleted

## 2015-10-12 DIAGNOSIS — T380X5A Adverse effect of glucocorticoids and synthetic analogues, initial encounter: Secondary | ICD-10-CM | POA: Diagnosis present

## 2015-10-12 DIAGNOSIS — F329 Major depressive disorder, single episode, unspecified: Secondary | ICD-10-CM | POA: Diagnosis present

## 2015-10-12 DIAGNOSIS — Z794 Long term (current) use of insulin: Secondary | ICD-10-CM | POA: Diagnosis not present

## 2015-10-12 DIAGNOSIS — Z9484 Stem cells transplant status: Secondary | ICD-10-CM | POA: Diagnosis not present

## 2015-10-12 DIAGNOSIS — D649 Anemia, unspecified: Secondary | ICD-10-CM | POA: Diagnosis not present

## 2015-10-12 DIAGNOSIS — Z7951 Long term (current) use of inhaled steroids: Secondary | ICD-10-CM | POA: Diagnosis not present

## 2015-10-12 DIAGNOSIS — D89810 Acute graft-versus-host disease: Secondary | ICD-10-CM | POA: Diagnosis not present

## 2015-10-12 DIAGNOSIS — E119 Type 2 diabetes mellitus without complications: Secondary | ICD-10-CM | POA: Diagnosis not present

## 2015-10-12 DIAGNOSIS — I10 Essential (primary) hypertension: Secondary | ICD-10-CM | POA: Diagnosis present

## 2015-10-12 DIAGNOSIS — A419 Sepsis, unspecified organism: Secondary | ICD-10-CM | POA: Diagnosis not present

## 2015-10-12 DIAGNOSIS — R05 Cough: Secondary | ICD-10-CM | POA: Diagnosis not present

## 2015-10-12 DIAGNOSIS — E876 Hypokalemia: Secondary | ICD-10-CM | POA: Diagnosis present

## 2015-10-12 DIAGNOSIS — B961 Klebsiella pneumoniae [K. pneumoniae] as the cause of diseases classified elsewhere: Secondary | ICD-10-CM | POA: Diagnosis present

## 2015-10-12 DIAGNOSIS — H04129 Dry eye syndrome of unspecified lacrimal gland: Secondary | ICD-10-CM | POA: Diagnosis not present

## 2015-10-12 DIAGNOSIS — G40909 Epilepsy, unspecified, not intractable, without status epilepticus: Secondary | ICD-10-CM | POA: Diagnosis present

## 2015-10-12 DIAGNOSIS — R140 Abdominal distension (gaseous): Secondary | ICD-10-CM | POA: Diagnosis not present

## 2015-10-12 DIAGNOSIS — E1165 Type 2 diabetes mellitus with hyperglycemia: Secondary | ICD-10-CM | POA: Diagnosis present

## 2015-10-12 DIAGNOSIS — N3000 Acute cystitis without hematuria: Secondary | ICD-10-CM | POA: Diagnosis present

## 2015-10-12 DIAGNOSIS — D899 Disorder involving the immune mechanism, unspecified: Secondary | ICD-10-CM | POA: Diagnosis not present

## 2015-10-12 DIAGNOSIS — J8489 Other specified interstitial pulmonary diseases: Secondary | ICD-10-CM | POA: Diagnosis not present

## 2015-10-12 DIAGNOSIS — Z86711 Personal history of pulmonary embolism: Secondary | ICD-10-CM | POA: Diagnosis not present

## 2015-10-12 DIAGNOSIS — J00 Acute nasopharyngitis [common cold]: Secondary | ICD-10-CM | POA: Diagnosis not present

## 2015-10-12 DIAGNOSIS — A4189 Other specified sepsis: Secondary | ICD-10-CM | POA: Diagnosis present

## 2015-10-12 DIAGNOSIS — C9101 Acute lymphoblastic leukemia, in remission: Secondary | ICD-10-CM | POA: Diagnosis present

## 2015-10-12 DIAGNOSIS — R10819 Abdominal tenderness, unspecified site: Secondary | ICD-10-CM | POA: Diagnosis not present

## 2015-10-12 DIAGNOSIS — Z9221 Personal history of antineoplastic chemotherapy: Secondary | ICD-10-CM | POA: Diagnosis not present

## 2015-10-12 DIAGNOSIS — D89813 Graft-versus-host disease, unspecified: Secondary | ICD-10-CM | POA: Diagnosis not present

## 2015-10-12 DIAGNOSIS — R197 Diarrhea, unspecified: Secondary | ICD-10-CM | POA: Diagnosis not present

## 2015-10-12 DIAGNOSIS — R509 Fever, unspecified: Secondary | ICD-10-CM | POA: Diagnosis not present

## 2015-10-12 DIAGNOSIS — D61818 Other pancytopenia: Secondary | ICD-10-CM | POA: Diagnosis not present

## 2015-10-12 DIAGNOSIS — D61810 Antineoplastic chemotherapy induced pancytopenia: Secondary | ICD-10-CM | POA: Diagnosis not present

## 2015-10-12 DIAGNOSIS — M25559 Pain in unspecified hip: Secondary | ICD-10-CM | POA: Diagnosis not present

## 2015-10-12 DIAGNOSIS — D89811 Chronic graft-versus-host disease: Secondary | ICD-10-CM | POA: Diagnosis present

## 2015-10-12 DIAGNOSIS — N390 Urinary tract infection, site not specified: Secondary | ICD-10-CM | POA: Diagnosis not present

## 2015-10-12 LAB — URINE CULTURE: Culture: >=100000 — AB

## 2015-10-12 NOTE — Progress Notes (Signed)
Faxed BCR-ABL and labs from 9/7 to Dr. Harvel Ricks at [facility]  [phone].

## 2015-10-13 ENCOUNTER — Telehealth (HOSPITAL_BASED_OUTPATIENT_CLINIC_OR_DEPARTMENT_OTHER): Payer: Self-pay

## 2015-10-13 LAB — CULTURE, BLOOD (SINGLE)

## 2015-10-13 NOTE — Telephone Encounter (Signed)
Post ED Visit - Positive Culture Follow-up: Unsuccessful Patient Follow-up  Culture assessed and recommendations reviewed by: []  Elenor Quinones, Pharm.D. []  Heide Guile, Pharm.D., BCPS []  Parks Neptune, Pharm.D. []  Alycia Rossetti, Pharm.D., BCPS []  [place], [place].D., BCPS, AAHIVP []  Legrand Como, Pharm.D., BCPS, AAHIVP []  Milus Glazier, Pharm.D. []  Stephens November, Pharm.DUvaldo Bristle Pharm D Positive urine culture  [x]  Patient discharged without antimicrobial prescription and treatment is now indicated []  Organism is resistant to prescribed ED discharge antimicrobial []  Patient with positive blood cultures   Unable to contact patient after 3 attempts, letter will be sent to address on file  Genia Del 10/13/2015, 9:34 AM

## 2015-10-13 NOTE — Progress Notes (Signed)
ED Antimicrobial Stewardship Positive Culture Follow Up   Lynn [name] is an 50 y.o. female who presented to [facility] on 10/10/2015 with a chief complaint of  Chief Complaint  Patient presents with  . Fever    Recent Results (from the past 720 hour(s))  Urine culture     Status: None   Collection Time: 10/07/15 10:39 AM  Result Value Ref Range Status   Urine Culture, Routine Final report  Final   Urine Culture result 1 Comment  Final    Comment: Mixed urogenital flora 10,000-25,000 colony forming units per mL   Culture, blood (single) w Reflex to ID Panel     Status: None (Preliminary result)   Collection Time: 10/07/15 10:39 AM  Result Value Ref Range Status   BLOOD CULTURE, ROUTINE Preliminary report  Preliminary   RESULT 1 Comment  Preliminary    Comment: No growth in 36 - 48 hours.  Urine culture     Status: Abnormal   Collection Time: 10/10/15  9:55 AM  Result Value Ref Range Status   Specimen Description URINE, CLEAN CATCH  Final   Special Requests NONE  Final   Culture >=100,000 COLONIES/mL KLEBSIELLA PNEUMONIAE (A)  Final   Report Status 10/12/2015 FINAL  Final   Organism ID, Bacteria KLEBSIELLA PNEUMONIAE (A)  Final      Susceptibility   Klebsiella pneumoniae - MIC*    AMPICILLIN >=32 RESISTANT Resistant     CEFAZOLIN <=4 SENSITIVE Sensitive     CEFTRIAXONE <=1 SENSITIVE Sensitive     CIPROFLOXACIN 1 SENSITIVE Sensitive     GENTAMICIN <=1 SENSITIVE Sensitive     IMIPENEM <=0.25 SENSITIVE Sensitive     NITROFURANTOIN 64 INTERMEDIATE Intermediate     TRIMETH/SULFA >=320 RESISTANT Resistant     AMPICILLIN/SULBACTAM 4 SENSITIVE Sensitive     PIP/TAZO <=4 SENSITIVE Sensitive     Extended ESBL NEGATIVE Sensitive     * >=100,000 COLONIES/mL KLEBSIELLA PNEUMONIAE  Blood culture (routine x 2)     Status: None (Preliminary result)   Collection Time: 10/10/15 12:00 PM  Result Value Ref Range Status   Specimen Description BLOOD BLOOD RIGHT HAND  Final   Special  Requests NONE  Final   Culture   Final    NO GROWTH 2 DAYS Performed at [facility]    Report Status PENDING  Incomplete  Blood culture (routine x 2)     Status: None (Preliminary result)   Collection Time: 10/10/15 12:05 PM  Result Value Ref Range Status   Specimen Description BLOOD RIGHT ANTECUBITAL  Final   Special Requests NONE  Final   Culture   Final    NO GROWTH 2 DAYS Performed at [facility]    Report Status PENDING  Incomplete  Respiratory Panel by PCR     Status: None   Collection Time: 10/10/15 12:20 PM  Result Value Ref Range Status   Adenovirus NOT DETECTED NOT DETECTED Final   Coronavirus 229E NOT DETECTED NOT DETECTED Final   Coronavirus HKU1 NOT DETECTED NOT DETECTED Final   Coronavirus NL63 NOT DETECTED NOT DETECTED Final   Coronavirus OC43 NOT DETECTED NOT DETECTED Final   Metapneumovirus NOT DETECTED NOT DETECTED Final   Rhinovirus / Enterovirus NOT DETECTED NOT DETECTED Final   Influenza A NOT DETECTED NOT DETECTED Final   Influenza B NOT DETECTED NOT DETECTED Final   Parainfluenza Virus 1 NOT DETECTED NOT DETECTED Final   Parainfluenza Virus 2 NOT DETECTED NOT DETECTED Final  Parainfluenza Virus 3 NOT DETECTED NOT DETECTED Final   Parainfluenza Virus 4 NOT DETECTED NOT DETECTED Final   Respiratory Syncytial Virus NOT DETECTED NOT DETECTED Final   Bordetella pertussis NOT DETECTED NOT DETECTED Final   Chlamydophila pneumoniae NOT DETECTED NOT DETECTED Final   Mycoplasma pneumoniae NOT DETECTED NOT DETECTED Final    Comment: Performed at [facility]    [x]  Patient discharged originally without antimicrobial agent and treatment is now indicated  New antibiotic prescription: cephalexin  500 mg PO BID x 7 days  ED Provider: Margarita Mail, PA-C  Dimitri Ped, PharmD. PGY-2 Pharmacy Resident Pager: [phone] 10/13/2015, 8:25 AM

## 2015-10-15 LAB — CULTURE, BLOOD (ROUTINE X 2)
CULTURE: NO GROWTH
Culture: NO GROWTH

## 2015-10-18 ENCOUNTER — Telehealth (HOSPITAL_BASED_OUTPATIENT_CLINIC_OR_DEPARTMENT_OTHER): Payer: Self-pay | Admitting: Emergency Medicine

## 2015-10-20 ENCOUNTER — Telehealth: Payer: Self-pay | Admitting: *Deleted

## 2015-10-20 DIAGNOSIS — D899 Disorder involving the immune mechanism, unspecified: Secondary | ICD-10-CM | POA: Diagnosis not present

## 2015-10-20 DIAGNOSIS — R509 Fever, unspecified: Secondary | ICD-10-CM | POA: Diagnosis not present

## 2015-10-20 DIAGNOSIS — C9101 Acute lymphoblastic leukemia, in remission: Secondary | ICD-10-CM | POA: Diagnosis not present

## 2015-10-20 DIAGNOSIS — Z9484 Stem cells transplant status: Secondary | ICD-10-CM | POA: Diagnosis not present

## 2015-10-20 NOTE — Telephone Encounter (Signed)
She is a transplant patient on immunosuppressant. Since she just got released from [facility], she should call her transplant coordinator for advice

## 2015-10-20 NOTE — Telephone Encounter (Signed)
Informed pt of Dr. Calton Dach reply and pt verbalized understanding.  She will call [facility].

## 2015-10-20 NOTE — Telephone Encounter (Signed)
Pt says she was d/c'd from [facility] on Saturday with UTI.  She is still on Cipro, but started running a temp last night 100.49F.   She asks for nurse to return her call to advise.

## 2015-10-21 DIAGNOSIS — M545 Low back pain: Secondary | ICD-10-CM | POA: Diagnosis not present

## 2015-10-21 DIAGNOSIS — D89811 Chronic graft-versus-host disease: Secondary | ICD-10-CM | POA: Diagnosis not present

## 2015-10-21 DIAGNOSIS — N390 Urinary tract infection, site not specified: Secondary | ICD-10-CM | POA: Diagnosis not present

## 2015-10-21 DIAGNOSIS — D649 Anemia, unspecified: Secondary | ICD-10-CM | POA: Diagnosis not present

## 2015-10-21 DIAGNOSIS — T380X5A Adverse effect of glucocorticoids and synthetic analogues, initial encounter: Secondary | ICD-10-CM | POA: Diagnosis present

## 2015-10-21 DIAGNOSIS — N200 Calculus of kidney: Secondary | ICD-10-CM | POA: Diagnosis not present

## 2015-10-21 DIAGNOSIS — C9101 Acute lymphoblastic leukemia, in remission: Secondary | ICD-10-CM | POA: Diagnosis not present

## 2015-10-21 DIAGNOSIS — B961 Klebsiella pneumoniae [K. pneumoniae] as the cause of diseases classified elsewhere: Secondary | ICD-10-CM | POA: Diagnosis present

## 2015-10-21 DIAGNOSIS — R509 Fever, unspecified: Secondary | ICD-10-CM | POA: Diagnosis not present

## 2015-10-21 DIAGNOSIS — R51 Headache: Secondary | ICD-10-CM | POA: Diagnosis not present

## 2015-10-21 DIAGNOSIS — C9100 Acute lymphoblastic leukemia not having achieved remission: Secondary | ICD-10-CM | POA: Diagnosis not present

## 2015-10-21 DIAGNOSIS — E119 Type 2 diabetes mellitus without complications: Secondary | ICD-10-CM | POA: Diagnosis not present

## 2015-10-21 DIAGNOSIS — R197 Diarrhea, unspecified: Secondary | ICD-10-CM | POA: Diagnosis not present

## 2015-10-21 DIAGNOSIS — R109 Unspecified abdominal pain: Secondary | ICD-10-CM | POA: Diagnosis not present

## 2015-10-21 DIAGNOSIS — Z7984 Long term (current) use of oral hypoglycemic drugs: Secondary | ICD-10-CM | POA: Diagnosis not present

## 2015-10-21 DIAGNOSIS — Z9484 Stem cells transplant status: Secondary | ICD-10-CM | POA: Diagnosis not present

## 2015-10-21 DIAGNOSIS — C9102 Acute lymphoblastic leukemia, in relapse: Secondary | ICD-10-CM | POA: Diagnosis not present

## 2015-10-21 DIAGNOSIS — Z95828 Presence of other vascular implants and grafts: Secondary | ICD-10-CM | POA: Diagnosis not present

## 2015-10-21 DIAGNOSIS — D61818 Other pancytopenia: Secondary | ICD-10-CM | POA: Diagnosis present

## 2015-10-21 DIAGNOSIS — E1165 Type 2 diabetes mellitus with hyperglycemia: Secondary | ICD-10-CM | POA: Diagnosis present

## 2015-10-21 DIAGNOSIS — D899 Disorder involving the immune mechanism, unspecified: Secondary | ICD-10-CM | POA: Diagnosis not present

## 2015-10-21 DIAGNOSIS — D89813 Graft-versus-host disease, unspecified: Secondary | ICD-10-CM | POA: Diagnosis not present

## 2015-10-29 ENCOUNTER — Emergency Department (HOSPITAL_COMMUNITY)
Admission: EM | Admit: 2015-10-29 | Discharge: 2015-10-29 | Disposition: A | Discharge status: Home / Self Care | Payer: Medicare Other | Attending: Emergency Medicine | Admitting: Emergency Medicine

## 2015-10-29 ENCOUNTER — Encounter (HOSPITAL_COMMUNITY): Payer: Self-pay | Admitting: Emergency Medicine

## 2015-10-29 ENCOUNTER — Emergency Department (HOSPITAL_COMMUNITY): Payer: Medicare Other

## 2015-10-29 DIAGNOSIS — Z794 Long term (current) use of insulin: Secondary | ICD-10-CM | POA: Diagnosis not present

## 2015-10-29 DIAGNOSIS — E119 Type 2 diabetes mellitus without complications: Secondary | ICD-10-CM | POA: Diagnosis not present

## 2015-10-29 DIAGNOSIS — I10 Essential (primary) hypertension: Secondary | ICD-10-CM | POA: Insufficient documentation

## 2015-10-29 DIAGNOSIS — R5382 Chronic fatigue, unspecified: Secondary | ICD-10-CM | POA: Insufficient documentation

## 2015-10-29 DIAGNOSIS — R930 Abnormal findings on diagnostic imaging of skull and head, not elsewhere classified: Secondary | ICD-10-CM | POA: Diagnosis not present

## 2015-10-29 DIAGNOSIS — J45909 Unspecified asthma, uncomplicated: Secondary | ICD-10-CM | POA: Diagnosis not present

## 2015-10-29 DIAGNOSIS — R531 Weakness: Secondary | ICD-10-CM | POA: Diagnosis not present

## 2015-10-29 DIAGNOSIS — R5383 Other fatigue: Secondary | ICD-10-CM | POA: Diagnosis present

## 2015-10-29 DIAGNOSIS — R404 Transient alteration of awareness: Secondary | ICD-10-CM | POA: Diagnosis not present

## 2015-10-29 LAB — URINALYSIS, ROUTINE W REFLEX MICROSCOPIC
Bilirubin Urine: NEGATIVE
GLUCOSE, UA: NEGATIVE mg/dL
HGB URINE DIPSTICK: NEGATIVE
Ketones, ur: NEGATIVE mg/dL
Leukocytes, UA: NEGATIVE
Nitrite: NEGATIVE
PH: 7.5 (ref 5.0–8.0)
Protein, ur: 30 mg/dL — AB
SPECIFIC GRAVITY, URINE: 1.014 (ref 1.005–1.030)

## 2015-10-29 LAB — CBC WITH DIFFERENTIAL/PLATELET
BASOS PCT: 0 %
Basophils Absolute: 0 10*3/uL (ref 0.0–0.1)
Eosinophils Absolute: 0 10*3/uL (ref 0.0–0.7)
Eosinophils Relative: 0 %
HEMATOCRIT: 27.7 % — AB (ref 36.0–46.0)
HEMOGLOBIN: 9.2 g/dL — AB (ref 12.0–15.0)
LYMPHS ABS: 1.8 10*3/uL (ref 0.7–4.0)
Lymphocytes Relative: 16 %
MCH: 30.4 pg (ref 26.0–34.0)
MCHC: 33.2 g/dL (ref 30.0–36.0)
MCV: 91.4 fL (ref 78.0–100.0)
MONOS PCT: 12 %
Monocytes Absolute: 1.4 10*3/uL — ABNORMAL HIGH (ref 0.1–1.0)
NEUTROS ABS: 8.1 10*3/uL — AB (ref 1.7–7.7)
NEUTROS PCT: 72 %
Platelets: 219 10*3/uL (ref 150–400)
RBC: 3.03 MIL/uL — ABNORMAL LOW (ref 3.87–5.11)
RDW: 15.9 % — ABNORMAL HIGH (ref 11.5–15.5)
WBC: 11.3 10*3/uL — ABNORMAL HIGH (ref 4.0–10.5)

## 2015-10-29 LAB — COMPREHENSIVE METABOLIC PANEL
ALBUMIN: 3.1 g/dL — AB (ref 3.5–5.0)
ALT: 15 U/L (ref 14–54)
AST: 28 U/L (ref 15–41)
Alkaline Phosphatase: 94 U/L (ref 38–126)
Anion gap: 8 (ref 5–15)
BUN: 11 mg/dL (ref 6–20)
CHLORIDE: 102 mmol/L (ref 101–111)
CO2: 25 mmol/L (ref 22–32)
CREATININE: 1.97 mg/dL — AB (ref 0.44–1.00)
Calcium: 8.7 mg/dL — ABNORMAL LOW (ref 8.9–10.3)
GFR, EST AFRICAN AMERICAN: 33 mL/min — AB (ref >60)
GFR, EST NON AFRICAN AMERICAN: 28 mL/min — AB (ref >60)
GLUCOSE: 95 mg/dL (ref 65–99)
POTASSIUM: 3.4 mmol/L — AB (ref 3.5–5.1)
Sodium: 135 mmol/L (ref 135–145)
Total Bilirubin: 0.6 mg/dL (ref 0.3–1.2)
Total Protein: 6.6 g/dL (ref 6.5–8.1)

## 2015-10-29 LAB — URINE MICROSCOPIC-ADD ON
Bacteria, UA: NONE SEEN
RBC / HPF: NONE SEEN RBC/hpf (ref 0–5)

## 2015-10-29 LAB — VALPROIC ACID LEVEL: Valproic Acid Lvl: <10 ug/mL — ABNORMAL LOW (ref 50.0–100.0)

## 2015-10-29 MED ORDER — SODIUM CHLORIDE 0.9 % IV SOLN
INTRAVENOUS | Status: DC
  Administered 2015-10-29: 75 mL/h via INTRAVENOUS

## 2015-10-29 MED ORDER — SODIUM CHLORIDE 0.9 % IV BOLUS (SEPSIS)
500.0000 mL | Freq: Once | INTRAVENOUS | Status: AC
  Administered 2015-10-29: 500 mL via INTRAVENOUS

## 2015-10-29 MED ORDER — HEPARIN SOD (PORK) LOCK FLUSH 100 UNIT/ML IV SOLN
500.0000 [IU] | Freq: Once | INTRAVENOUS | Status: AC
  Administered 2015-10-29: 500 [IU]
  Filled 2015-10-29: qty 5

## 2015-10-29 NOTE — ED Provider Notes (Signed)
[facility] DEPT Provider Note   CSN: [id number] Arrival date & time: 10/29/15  1306     History   Chief Complaint Chief Complaint  Patient presents with  . Fatigue    HPI Lynn [name] is a 50 y.o. female.  Patient with a history of ALL, followed by hematology oncology at [facility]. Patient's had 2 recent admissions there. The most recent one was from Thursday to Monday. Patient was admitted for fever and fatigue without any specific findings. Earlier she was admitted and had a urinary tract infection that was treated with Cipro. No evidence of recurrent urinary tract infection during the most recent admission. Patient has follow-up with [facility] hematology oncology on October 10. Patient states that symptoms of fatigue anorexia and the feeling of chills have persisted. Patient's daughter thought that for a very brief period of time today patient had slurred speech and was leaning to one side. Apparently this lasted only seconds did not last minutes. All symptoms have resolved. Patient's known to have anemia which Mina Marble is aware of. Patient states that her hemoglobin was 7.2 on Monday and she was not transfused. Patient denies any further fevers.      Past Medical History:  Diagnosis Date  . ALL (acute lymphoblastic leukemia) ([place])   . Anemia   . Arthritis   . Brain bleed ([place])    07/04/11  . Diabetes mellitus without complication ([place])   . Headache(784.0)   . Hypertension   . Leukemia ([place])   . Pneumonia   . Pulmonary embolism ([place])    06/22/11  . Thrush of mouth and esophagus ([place]) 04/09/2014    Patient Active Problem List   Diagnosis Date Noted  . Blurry vision, bilateral 10/07/2015  . Chronic headaches 05/06/2015  . Dehydration 05/21/2014  . Prerenal renal failure 05/21/2014  . Thrush of mouth and esophagus ([place]) 04/09/2014  . Chronic graft-versus-host disease ([place]) 04/09/2014  . Leukopenia 01/08/2014  . S/P allogeneic bone marrow transplant ([place]) 01/08/2014  .  Asthma, chronic 12/13/2013  . Pulmonary infiltrates 12/13/2013  . Cryptogenic organizing pneumonia 08/27/2013  . BOOP (bronchiolitis obliterans with organizing pneumonia) ([place]) 08/08/2013  . Insulin dependent type 2 diabetes mellitus, uncontrolled ([place]) 07/30/2013  . HCAP (healthcare-associated pneumonia) 06/10/2013  . History of pulmonary embolism 01/11/2013  . Greenfield filter in place 01/11/2013  . Port-a-cath in place 01/09/2013  . Right thyroid nodule 07/09/2012  . Fever 05/13/2012  . History of peripheral stem cell transplant ([place]) 01/12/2012  . Transfusion reaction 11/27/2011  . Acute lymphoblastic leukemia in remission ([place]) 08/21/2011  . Subdural hemorrhage ([place]) 07/26/2011  . Essential hypertension 07/06/2011  . Thrombocytopenia ([place]) 06/17/2011  . Anemia in neoplastic disease 06/17/2011  . Hilar lymphadenopathy 06/17/2011    Past Surgical History:  Procedure Laterality Date  . BONE MARROW TRANSPLANT    . CESAREAN SECTION  2000  . CRANIOTOMY  07/04/11  . EYE SURGERY Right 2005   repair crossed eye  . FOOT SURGERY    . greenfield filter    . LUNG BIOPSY  06/19/13    OB History    Gravida Para Term Preterm AB Living   2 2 2     2    SAB TAB Ectopic Multiple Live Births                   Home Medications    Prior to Admission medications   Medication Sig Start Date End Date Taking? Authorizing Provider  ACCU-CHEK AVIVA PLUS test strip USE  TO TEST BLOOD GLUCOSE 2 TIMES DAILY 10/23/14  Yes Historical Provider, MD  acyclovir (ZOVIRAX) 800 MG tablet Take 1 tablet (800 mg total) by mouth 2 (two) times daily. . 08/27/13  Yes Concha Norway, MD  ADVAIR DISKUS 250-50 MCG/DOSE AEPB INHALE 1 PUFF INTO THE LUNGS EVERY 12 HOURS 01/18/15  Yes Historical Provider, MD  amitriptyline (ELAVIL) 10 MG tablet Take 2 tablets (20 mg total) by mouth at bedtime. 02/04/15  Yes Melvenia Beam, MD  cetirizine (ZYRTEC) 10 MG tablet Take 1 tablet (10 mg total) by mouth daily. Patient taking  differently: Take 10 mg by mouth daily as needed for allergies.  03/07/14  Yes Melony Overly, MD  Elastic Bandages & Supports (MEDICAL COMPRESSION SOCKS) MISC Please dispense one pair. 05/13/15  Yes Etta Quill, NP  fluconazole (DIFLUCAN) 200 MG tablet Take 200 mg by mouth daily.  05/03/15  Yes Historical Provider, MD  folic acid (FOLVITE) A999333 MCG tablet Take 400 mcg by mouth daily.   Yes Historical Provider, MD  gabapentin (NEURONTIN) 300 MG capsule TAKE ONE CAPSULE BY MOUTH THREE TIMES DAILY Patient taking differently: TAKE ONE CAPSULE BY MOUTH THREE TIMES DAILY as needed for nerve pain 05/10/15  Yes Melvenia Beam, MD  hydrochlorothiazide (MICROZIDE) 12.5 MG capsule Take 12.5 mg by mouth daily.   Yes Historical Provider, MD  IMBRUVICA 140 MG capsul Take 140 mg by mouth daily.  09/28/15  Yes Historical Provider, MD  Insulin Glargine (LANTUS) 100 UNIT/ML Solostar Pen Inject 12 units everymore, can use sliding scale Patient taking differently: Inject 8-10 Units into the skin daily as needed (for blood sugar). can use sliding scale 11/16/14  Yes Elayne Snare, MD  levofloxacin (LEVAQUIN) 750 MG tablet Take 750 mg by mouth daily. Started 09/25 for 6 days 10/25/15  Yes Historical Provider, MD  LORazepam (ATIVAN) 1 MG tablet Take 1 mg by mouth every 4 (four) hours as needed for anxiety.  02/06/14  Yes Historical Provider, MD  Magnesium 250 MG TABS Take 250 mg by mouth daily.   Yes Historical Provider, MD  Multiple Vitamin (MULTIVITAMIN) tablet Take 1 tablet by mouth daily.   Yes Historical Provider, MD  mycophenolate (CELLCEPT) 250 MG capsule Take 250 mg by mouth 2 (two) times daily.  05/18/15 05/17/16 Yes Historical Provider, MD  NOVOFINE PLUS 32G X 4 MM MISC  05/31/14  Yes Historical Provider, MD  NOVOLOG FLEXPEN 100 UNIT/ML FlexPen INJECT 16 UNITS WITH EACH MEAL PLUS 18-20 EXTRA UNITS IF SUGAR IS OVER 250 Patient taking differently: INJECT 16 UNITS WITH EACH MEAL PLUS 18-20 EXTRA UNITS IF SUGAR IS OVER 260 09/06/15   Yes Elayne Snare, MD  ondansetron (ZOFRAN ODT) 4 MG disintegrating tablet Take 1 tablet (4 mg total) by mouth every 8 (eight) hours as needed for nausea. 09/24/13  Yes Concha Norway, MD  Polyethyl Glycol-Propyl Glycol (SYSTANE) 0.4-0.3 % SOLN Place 1 drop into both eyes daily.   Yes Historical Provider, MD  potassium chloride SA (K-DUR,KLOR-CON) 20 MEQ tablet TAKE 1 TABLET (20 MEQ TOTAL) BY MOUTH DAILY. 10/05/15  Yes Elayne Snare, MD  pravastatin (PRAVACHOL) 20 MG tablet Take 1 tablet (20 mg total) by mouth daily. 10/01/15  Yes Elayne Snare, MD  predniSONE (DELTASONE) 10 MG tablet Take 10 mg by mouth daily with breakfast.   Yes Historical Provider, MD  prochlorperazine (COMPAZINE) 5 MG tablet Take 5 mg by mouth every 6 (six) hours as needed for nausea or vomiting.  10/12/14  Yes Historical Provider, MD  RESTASIS 0.05 % ophthalmic emulsion PLACE 1 DROP INTO BOTH EYES 2 TIMES DAILY. 08/19/15  Yes Historical Provider, MD  Sirolimus (RAPAMUNE) 0.5 MG TABS Take 0.5 mg by mouth daily.   Yes Historical Provider, MD  SitaGLIPtin-MetFORMIN HCl (JANUMET XR) 50-1000 MG TB24 Take 2 tablets by mouth daily. Patient taking differently: Take 2 tablets by mouth every evening.  10/01/15  Yes Elayne Snare, MD  sulfamethoxazole-trimethoprim (BACTRIM DS,SEPTRA DS) 800-160 MG per tablet Take 1 tablet by mouth 3 (three) times a week. Monday,Wednesday, and Friday 06/19/14  Yes Heath Lark, MD  SUMAtriptan (IMITREX) 20 MG/ACT nasal spray Place 1 spray (20 mg total) into the nose once. May repeat in 2 hours if headache persists or recurs. 02/04/15  Yes Melvenia Beam, MD  traMADol (ULTRAM) 50 MG tablet Take 1 tablet (50 mg total) by mouth every 6 (six) hours as needed for moderate pain. 09/24/13  Yes Concha Norway, MD    Family History Family History  Problem Relation Age of Onset  . Pancreatic cancer Father   . Cancer Mother     colon  . Pancreatic cancer Mother   . Asthma Son   . Hypertension Sister   . Diabetes Neg Hx   . Heart disease  Neg Hx     Social History Social History  Substance Use Topics  . Smoking status: Never Smoker  . Smokeless tobacco: Never Used  . Alcohol use No     Allergies   Review of patient's allergies indicates no known allergies.   Review of Systems Review of Systems  Constitutional: Positive for activity change, appetite change, chills and fatigue.  HENT: Negative for congestion and sore throat.   Eyes: Positive for photophobia.  Respiratory: Negative for shortness of breath.   Cardiovascular: Negative for chest pain.  Gastrointestinal: Negative for abdominal pain.  Genitourinary: Negative for dysuria.  Musculoskeletal: Negative for myalgias.  Neurological: Positive for headaches.  Hematological: Does not bruise/bleed easily.  Psychiatric/Behavioral: Negative for confusion.     Physical Exam Updated Vital Signs BP 111/79 (BP Location: Right Arm)   Pulse 105   Temp 98.5 F (36.9 C) (Oral)   Resp 18   LMP 08/09/2011   SpO2 100%   Physical Exam  Constitutional: She is oriented to person, place, and time. She appears well-developed and well-nourished.  HENT:  Head: Normocephalic and atraumatic.  Mouth/Throat: Oropharynx is clear and moist.  Eyes: EOM are normal. Pupils are equal, round, and reactive to light.  Neck: Normal range of motion. Neck supple.  Cardiovascular: Normal rate, regular rhythm and normal heart sounds.   Pulmonary/Chest: Effort normal and breath sounds normal. No respiratory distress.  Abdominal: Soft. Bowel sounds are normal. There is no tenderness.  Musculoskeletal: Normal range of motion.  Neurological: She is alert and oriented to person, place, and time. No cranial nerve deficit. She exhibits normal muscle tone. Coordination normal.  Skin: Skin is warm.  Nursing note and vitals reviewed.    ED Treatments / Results  Labs (all labs ordered are listed, but only abnormal results are displayed) Labs Reviewed  CBC WITH DIFFERENTIAL/PLATELET -  Abnormal; Notable for the following:       Result Value   WBC 11.3 (*)    RBC 3.03 (*)    Hemoglobin 9.2 (*)    HCT 27.7 (*)    RDW 15.9 (*)    Neutro Abs 8.1 (*)    Monocytes Absolute 1.4 (*)    All other components within normal limits  COMPREHENSIVE METABOLIC PANEL - Abnormal; Notable for the following:    Potassium 3.4 (*)    Creatinine, Ser 1.97 (*)    Calcium 8.7 (*)    Albumin 3.1 (*)    GFR calc non Af Amer 28 (*)    GFR calc Af Amer 33 (*)    All other components within normal limits  URINALYSIS, ROUTINE W REFLEX MICROSCOPIC (NOT AT [facility]) - Abnormal; Notable for the following:    Protein, ur 30 (*)    All other components within normal limits  VALPROIC ACID LEVEL - Abnormal; Notable for the following:    Valproic Acid Lvl <10 (*)    All other components within normal limits  URINE MICROSCOPIC-ADD ON - Abnormal; Notable for the following:    Squamous Epithelial / LPF 0-5 (*)    All other components within normal limits   Results for orders placed or performed during the hospital encounter of 10/29/15  CBC with Differential/Platelet  Result Value Ref Range   WBC 11.3 (H) 4.0 - 10.5 K/uL   RBC 3.03 (L) 3.87 - 5.11 MIL/uL   Hemoglobin 9.2 (L) 12.0 - 15.0 g/dL   HCT 27.7 (L) 36.0 - 46.0 %   MCV 91.4 78.0 - 100.0 fL   MCH 30.4 26.0 - 34.0 pg   MCHC 33.2 30.0 - 36.0 g/dL   RDW 15.9 (H) 11.5 - 15.5 %   Platelets 219 150 - 400 K/uL   Neutrophils Relative % 72 %   Neutro Abs 8.1 (H) 1.7 - 7.7 K/uL   Lymphocytes Relative 16 %   Lymphs Abs 1.8 0.7 - 4.0 K/uL   Monocytes Relative 12 %   Monocytes Absolute 1.4 (H) 0.1 - 1.0 K/uL   Eosinophils Relative 0 %   Eosinophils Absolute 0.0 0.0 - 0.7 K/uL   Basophils Relative 0 %   Basophils Absolute 0.0 0.0 - 0.1 K/uL  Comprehensive metabolic panel  Result Value Ref Range   Sodium 135 135 - 145 mmol/L   Potassium 3.4 (L) 3.5 - 5.1 mmol/L   Chloride 102 101 - 111 mmol/L   CO2 25 22 - 32 mmol/L   Glucose, Bld 95 65 - 99 mg/dL    BUN 11 6 - 20 mg/dL   Creatinine, Ser 1.97 (H) 0.44 - 1.00 mg/dL   Calcium 8.7 (L) 8.9 - 10.3 mg/dL   Total Protein 6.6 6.5 - 8.1 g/dL   Albumin 3.1 (L) 3.5 - 5.0 g/dL   AST 28 15 - 41 U/L   ALT 15 14 - 54 U/L   Alkaline Phosphatase 94 38 - 126 U/L   Total Bilirubin 0.6 0.3 - 1.2 mg/dL   GFR calc non Af Amer 28 (L) >60 mL/min   GFR calc Af Amer 33 (L) >60 mL/min   Anion gap 8 5 - 15  Urinalysis, Routine w reflex microscopic (not at [facility])  Result Value Ref Range   Color, Urine YELLOW YELLOW   APPearance CLEAR CLEAR   Specific Gravity, Urine 1.014 1.005 - 1.030   pH 7.5 5.0 - 8.0   Glucose, UA NEGATIVE NEGATIVE mg/dL   Hgb urine dipstick NEGATIVE NEGATIVE   Bilirubin Urine NEGATIVE NEGATIVE   Ketones, ur NEGATIVE NEGATIVE mg/dL   Protein, ur 30 (A) NEGATIVE mg/dL   Nitrite NEGATIVE NEGATIVE   Leukocytes, UA NEGATIVE NEGATIVE  Valproic acid level  Result Value Ref Range   Valproic Acid Lvl <10 (L) 50.0 - 100.0 ug/mL  Urine microscopic-add on  Result Value  Ref Range   Squamous Epithelial / LPF 0-5 (A) NONE SEEN   WBC, UA 0-5 0 - 5 WBC/hpf   RBC / HPF NONE SEEN 0 - 5 RBC/hpf   Bacteria, UA NONE SEEN NONE SEEN     EKG  EKG Interpretation  Date/Time:  Friday October 29 2015 16:14:18 EDT Ventricular Rate:  103 PR Interval:    QRS Duration: 88 QT Interval:  345 QTC Calculation: 452 R Axis:   13 Text Interpretation:  Sinus tachycardia Low voltage, precordial leads Abnormal R-wave progression, early transition Borderline T wave abnormalities No significant change since last tracing Confirmed by   MD,  [phone]) on 10/29/2015 4:35:28 PM       Radiology Ct Head Wo Contrast  Result Date: 10/29/2015 CLINICAL DATA:  Weakness EXAM: CT HEAD WITHOUT CONTRAST TECHNIQUE: Contiguous axial images were obtained from the base of the skull through the vertex without intravenous contrast. COMPARISON:  05/13/2012 FINDINGS: Brain: No intracranial hemorrhage, mass effect or  midline shift. No hydrocephalus. No acute cortical infarction. No mass lesion is noted on this unenhanced scan. Vascular: No hyperdense vessel or unexpected calcification. Skull: Again noted status post occipital craniotomy. Sinuses/Orbits: No acute finding. Other: None. IMPRESSION: No acute intracranial abnormality.  No significant change. Electronically Signed   By: Lahoma Crocker [name]   On: 10/29/2015 16:41    Procedures Procedures (including critical care time)  Medications Ordered in ED Medications  0.9 %  sodium chloride infusion (75 mL/hr Intravenous New Bag/Given 10/29/15 1600)  sodium chloride 0.9 % bolus 500 mL (500 mLs Intravenous New Bag/Given 10/29/15 1600)     Initial Impression / Assessment and Plan / ED Course  I have reviewed the triage vital signs and the nursing notes.  Pertinent labs & imaging results that were available during my care of the patient were reviewed by me and considered in my medical decision making (see chart for details).  Clinical Course   Patient with recent admissions at [facility] for fevers and fatigue fevers have resolved. Patient was hospitalized there Thursday through Monday. Earlier in the week she was hospitalized there urinary tract infection treated with Cipro. No evidence of recurrent urinary tract infection when she was recently in the hospital. She had the fatigue at that time they did have an explanation for. Patient has a history of ALL. Reportedly no recurrence. Patient's had anemia. Claims that earlier this week the hemoglobin was in the 7 range but today it's 9.2. Patient was not transfused. Patient has follow-up with hematology oncology at [facility] on October 10.  Patient's CBC and differential without any significant abnormalities.  Not able to explain patient's fatigue anorexia and the feeling of chills. Also patient had a brief moment supposedly today with slurred speech and leaning to one side head CT negative. Symptoms lasted only seconds.  Currently no neuro focal deficits. Patient stable for discharge home and follow-up with her primary care doctor and hematology oncology at [facility].  Patient also with complaint of a mild right temporal headache not severe resident several days states it has a little bit of light sensitivity. No neck stiffness no fevers since discharge.   Final Clinical Impressions(s) / ED Diagnoses   Final diagnoses:  Chronic fatigue    New Prescriptions New Prescriptions   No medications on file     Fredia Sorrow, MD 10/29/15 1702

## 2015-10-29 NOTE — ED Notes (Signed)
Bed: WA02 Expected date:  Expected time:  Means of arrival:  Comments: 49 yo weakness, lethargy-hx of leukemia

## 2015-10-29 NOTE — Discharge Instructions (Signed)
Workup here today without specific explanation for the fatigue. Follow-up with her hematology oncology doctors at [facility] as scheduled. Return for any new or worse symptoms.

## 2015-10-29 NOTE — ED Triage Notes (Addendum)
Patient brought in by EMS from home for fatigue.  Patient states that she was discharged from [facility] on Monday.  Patient states that her daughter was concerned while ago because when they were outside patient was "tilting and slurring a little bit". Patient also c/o of having chills.  Patient states that she had bone marrow transplant in 2015.

## 2015-11-05 ENCOUNTER — Other Ambulatory Visit: Payer: Self-pay | Admitting: Neurology

## 2015-11-05 ENCOUNTER — Ambulatory Visit: Payer: Medicare Other | Admitting: Nurse Practitioner

## 2015-11-08 ENCOUNTER — Encounter: Payer: Self-pay | Admitting: Nurse Practitioner

## 2015-11-09 DIAGNOSIS — C9101 Acute lymphoblastic leukemia, in remission: Secondary | ICD-10-CM | POA: Diagnosis not present

## 2015-11-09 DIAGNOSIS — D89811 Chronic graft-versus-host disease: Secondary | ICD-10-CM | POA: Diagnosis not present

## 2015-11-09 DIAGNOSIS — E876 Hypokalemia: Secondary | ICD-10-CM | POA: Diagnosis not present

## 2015-11-09 DIAGNOSIS — J42 Unspecified chronic bronchitis: Secondary | ICD-10-CM | POA: Diagnosis not present

## 2015-11-09 DIAGNOSIS — R509 Fever, unspecified: Secondary | ICD-10-CM | POA: Diagnosis not present

## 2015-11-09 DIAGNOSIS — Z9484 Stem cells transplant status: Secondary | ICD-10-CM | POA: Diagnosis not present

## 2015-11-09 DIAGNOSIS — T380X5A Adverse effect of glucocorticoids and synthetic analogues, initial encounter: Secondary | ICD-10-CM | POA: Diagnosis not present

## 2015-11-09 DIAGNOSIS — R739 Hyperglycemia, unspecified: Secondary | ICD-10-CM | POA: Diagnosis not present

## 2015-11-09 DIAGNOSIS — R7989 Other specified abnormal findings of blood chemistry: Secondary | ICD-10-CM | POA: Diagnosis not present

## 2015-11-10 ENCOUNTER — Other Ambulatory Visit: Payer: Self-pay | Admitting: Hematology and Oncology

## 2015-11-10 ENCOUNTER — Other Ambulatory Visit (INDEPENDENT_AMBULATORY_CARE_PROVIDER_SITE_OTHER): Payer: Medicare Other

## 2015-11-10 ENCOUNTER — Telehealth: Payer: Self-pay | Admitting: *Deleted

## 2015-11-10 ENCOUNTER — Telehealth: Payer: Self-pay | Admitting: Nurse Practitioner

## 2015-11-10 ENCOUNTER — Ambulatory Visit: Payer: Self-pay | Admitting: Nurse Practitioner

## 2015-11-10 DIAGNOSIS — E1165 Type 2 diabetes mellitus with hyperglycemia: Secondary | ICD-10-CM | POA: Diagnosis not present

## 2015-11-10 DIAGNOSIS — C9101 Acute lymphoblastic leukemia, in remission: Secondary | ICD-10-CM | POA: Diagnosis not present

## 2015-11-10 DIAGNOSIS — Z9484 Stem cells transplant status: Secondary | ICD-10-CM | POA: Diagnosis not present

## 2015-11-10 DIAGNOSIS — E876 Hypokalemia: Secondary | ICD-10-CM | POA: Diagnosis not present

## 2015-11-10 LAB — HEMOGLOBIN A1C: HEMOGLOBIN A1C: 6.3 % (ref 4.6–6.5)

## 2015-11-10 LAB — BASIC METABOLIC PANEL
BUN: 21 mg/dL (ref 6–23)
CALCIUM: 9 mg/dL (ref 8.4–10.5)
CHLORIDE: 104 meq/L (ref 96–112)
CO2: 24 meq/L (ref 19–32)
CREATININE: 1.65 mg/dL — AB (ref 0.40–1.20)
GFR: 42.15 mL/min — ABNORMAL LOW (ref >60.00)
Glucose, Bld: 101 mg/dL — ABNORMAL HIGH (ref 70–99)
Potassium: 4.2 mEq/L (ref 3.5–5.1)
Sodium: 140 mEq/L (ref 135–145)

## 2015-11-10 NOTE — Telephone Encounter (Signed)
Patient called 10:47:29 to cancel 2:45pm appointment w/NP, CM today due to call received from [facility], needs to get IV fluids, Magnesium, creatinine levels are really high, it's important she go today to get this done.

## 2015-11-10 NOTE — Telephone Encounter (Signed)
"  Darrold Junker NP with [facility] calling to see if this patient can be scheduled for IVF today.  Her creat is elevated.  She needs IVF with Magnesium and potassium.  Call me this morning at [phone] or page [phone]."  Aware provider out of office.  Will notify Partner and on-call.

## 2015-11-10 NOTE — Telephone Encounter (Signed)
Call from [place], [place], at [facility] States Dr. Harvel Ricks wants pt to have labs this Friday 10/13 and pt is asking to have them done at [facility].  They gave pt a Rx for lab orders to take to our lab, so labs can be sent directly to Dr. Harvel Ricks.  Can we schedule pt for lab appt on Friday?  Also Possibly  IVFs if ok w/ Dr. Alvy Bimler and if we have room.  If not they can do IVFs at [facility] if pt can just have morning lab appt at our clinic.   They can fax [place] orders.  Pt did get IVFs at [facility] today.  Nurse to call Colletta Maryland back at [phone].

## 2015-11-10 NOTE — Telephone Encounter (Signed)
Dr. Alvy Bimler not in our office today.  S/w her on phone and she says if: 1. Pt can see another provider in our clinic today to write orders and be responsible for pt today and 2. IVFs do Not require Pre Cert and 3.  We have room to give infusion,  Then she will be ok w/ pt getting IVFs here today.   In the future,  Dr. Alvy Bimler will be happy to arrange for pt to have IVFs and any supportive care at our clinic.  We can schedule pt back to see Dr. Alvy Bimler and work with [facility] to come up w/ a plan to take care of pt locally if pt desires.   It is not feasible to add pt last minute/ same day due to scheduling and need for pre cert for infusions.  Due to above, Selena Lesser, NP, directs for pt to keep infusion appt at [facility] today.  Notified Colletta Maryland, RN at [facility] (phone [phone])  Informed her of above and let pt know we will be happy to coordinate her care here in the future.  We need a little time to work on schedules, pre certs and for Dr. Alvy Bimler to see pt in office.  Colletta Maryland will call pt and let her know.

## 2015-11-11 ENCOUNTER — Encounter: Payer: Self-pay | Admitting: Nurse Practitioner

## 2015-11-11 NOTE — Telephone Encounter (Signed)
LVM for pt lab scheduled tomorrow morning and also informed Colletta Maryland, RN at [facility] of lab appt made.  Let us know if pt needs anything else and we can schedule pt to see Dr. Alvy Bimler in the future if needed.

## 2015-11-11 NOTE — Telephone Encounter (Signed)
OK to schedule labs only If she needs IVF, I will have to see her

## 2015-11-12 ENCOUNTER — Other Ambulatory Visit: Payer: Self-pay | Admitting: *Deleted

## 2015-11-12 ENCOUNTER — Telehealth: Payer: Self-pay | Admitting: *Deleted

## 2015-11-12 ENCOUNTER — Other Ambulatory Visit (HOSPITAL_BASED_OUTPATIENT_CLINIC_OR_DEPARTMENT_OTHER): Payer: Medicare Other

## 2015-11-12 DIAGNOSIS — C9101 Acute lymphoblastic leukemia, in remission: Secondary | ICD-10-CM

## 2015-11-12 LAB — COMPREHENSIVE METABOLIC PANEL
ALBUMIN: 3.1 g/dL — AB (ref 3.5–5.0)
ALK PHOS: 76 U/L (ref 40–150)
ALT: 8 U/L (ref 0–55)
AST: 14 U/L (ref 5–34)
Anion Gap: 10 mEq/L (ref 3–11)
BUN: 15.5 mg/dL (ref 7.0–26.0)
CALCIUM: 8.9 mg/dL (ref 8.4–10.4)
CHLORIDE: 104 meq/L (ref 98–109)
CO2: 23 mEq/L (ref 22–29)
Creatinine: 1.5 mg/dL — ABNORMAL HIGH (ref 0.6–1.1)
EGFR: 45 mL/min/{1.73_m2} — AB (ref >90)
Glucose: 140 mg/dl (ref 70–140)
POTASSIUM: 4 meq/L (ref 3.5–5.1)
SODIUM: 137 meq/L (ref 136–145)
Total Bilirubin: 0.23 mg/dL (ref 0.20–1.20)
Total Protein: 5.9 g/dL — ABNORMAL LOW (ref 6.4–8.3)

## 2015-11-12 LAB — CBC WITH DIFFERENTIAL/PLATELET
BASO%: 0.1 % (ref 0.0–2.0)
BASOS ABS: 0 10*3/uL (ref 0.0–0.1)
EOS ABS: 0 10*3/uL (ref 0.0–0.5)
EOS%: 0 % (ref 0.0–7.0)
HEMATOCRIT: 29.5 % — AB (ref 34.8–46.6)
HEMOGLOBIN: 9.4 g/dL — AB (ref 11.6–15.9)
LYMPH%: 17 % (ref 14.0–49.7)
MCH: 29.7 pg (ref 25.1–34.0)
MCHC: 31.9 g/dL (ref 31.5–36.0)
MCV: 93.1 fL (ref 79.5–101.0)
MONO#: 0.5 10*3/uL (ref 0.1–0.9)
MONO%: 5 % (ref 0.0–14.0)
NEUT#: 7.3 10*3/uL — ABNORMAL HIGH (ref 1.5–6.5)
NEUT%: 77.9 % — AB (ref 38.4–76.8)
Platelets: 146 10*3/uL (ref 145–400)
RBC: 3.17 10*6/uL — ABNORMAL LOW (ref 3.70–5.45)
RDW: 17.9 % — AB (ref 11.2–14.5)
WBC: 9.4 10*3/uL (ref 3.9–10.3)
lymph#: 1.6 10*3/uL (ref 0.9–3.3)

## 2015-11-12 LAB — MAGNESIUM: MAGNESIUM: 1.9 mg/dL (ref 1.5–2.5)

## 2015-11-12 NOTE — Telephone Encounter (Signed)
-----   Message from Heath Lark, MD sent at 11/12/2015 10:38 AM EDT ----- Regarding: labs Labs are ok with mild changes No need electrolyte replacement ----- Message ----- From: Interface, Lab In Three Zero One Sent: 11/12/2015   9:53 AM To: Heath Lark, MD

## 2015-11-12 NOTE — Telephone Encounter (Signed)
Informed pt of Dr. Calton Dach message regarding lab results. She verbalized understanding.

## 2015-11-15 ENCOUNTER — Encounter: Payer: Self-pay | Admitting: Endocrinology

## 2015-11-15 ENCOUNTER — Ambulatory Visit (INDEPENDENT_AMBULATORY_CARE_PROVIDER_SITE_OTHER): Payer: Medicare Other | Admitting: Endocrinology

## 2015-11-15 VITALS — BP 134/88 | HR 105 | Temp 98.4°F | Resp 16 | Ht 67.0 in | Wt 222.0 lb

## 2015-11-15 DIAGNOSIS — E1165 Type 2 diabetes mellitus with hyperglycemia: Secondary | ICD-10-CM | POA: Diagnosis not present

## 2015-11-15 DIAGNOSIS — Z794 Long term (current) use of insulin: Secondary | ICD-10-CM

## 2015-11-15 NOTE — Patient Instructions (Addendum)
Check blood sugars on waking up  3x weekly  Also check blood sugars about 2 hours after a meal and do this after different meals by rotation  Recommended blood sugar levels on waking up is 90-130 and about 2 hours after meal is 130-160  Please bring your blood sugar monitor to each visit, thank you  Take Novolog insulin 6-8 before dinner only if sugars stay > 180 after supper

## 2015-11-15 NOTE — Progress Notes (Signed)
Patient ID: Lynn [name], female   DOB: 05-18-1965, 50 y.o.   MRN: [id number]    Reason for Appointment: Followup for Type 2 Diabetes  Referring physician: Donnie Coffin  History of Present Illness:          Diagnosis: Type 2 diabetes mellitus, date of diagnosis: 2013        Past history: She weighed nearly 300 lb in 2013 around that time her diabetes was diagnosed Although her A1c at that time was reported at 7.4 she does not remember being told about diabetes and no treatment was given In 5/15 when she was getting steroids for her bone marrow transplant her blood sugar was over 400 She was started on insulin and had been taking a basal bolus insulin regimen subsequently while on prednisone Although she required small amounts of insulin she could not taper off insulin even when off prednisone  Recent history:    INSULIN regimen is NovoLog prn  She had been on insulin since she has been taking prednisone  She is now on Prednisone 10 mg bid but appears not to be requiring insulin regularly  Also now she is taking only 1 tablet of Janumet XR without side effects   Last A1c is 6.3, previously 6.  This is lower than expected for her home readings  Current blood sugar patterns and problems:  Her fasting blood sugars have been fairly close to normal but is checking infrequently  She had only one high reading of 171 midmorning, not clear if this was postprandial  She has only a couple of readings after her evening meal of 69 and 178  Highest reading was 271 at about 5 PM  She has not taken Novolog except when she is planning to eat a big meal.  She has however started walking more regularly recently and has felt better with taking prednisone       Oral hypoglycemic drugs the patient is taking are: Janumet XR once a day    Side effects from medications have been: Diarrhea from Metformin over 1000 mg dose  Glucose monitoring:  done less than 1 time a day          Glucometer:  Accu-Chek   Only 8 readings available  Glycemic control:   Lab Results  Component Value Date   HGBA1C 6.3 11/10/2015   HGBA1C 6.0 07/07/2015   HGBA1C 6.6 (H) 03/19/2015   Lab Results  Component Value Date   MICROALBUR 1.6 08/10/2014   LDLCALC 85 12/14/2014   CREATININE 1.5 (H) 11/12/2015    Self-care: The diet that the patient has been following is: tries to limit meal size  Meals: 2 meals per day.  with breakfast at 11 am. Supper 5 pm   Exercise: walking some Dietician visit: Most recent: 5/15 at the hospital              Compliance with the medical regimen: Fair  Weight history:  Wt Readings from Last 3 Encounters:  11/15/15 222 lb (100.7 kg)  10/07/15 220 lb 6.4 oz (100 kg)  09/13/15 228 lb (103.4 kg)   Appointment on 11/12/2015  Component Date Value Ref Range Status  . WBC 11/12/2015 9.4  3.9 - 10.3 10e3/uL Final  . NEUT# 11/12/2015 7.3* 1.5 - 6.5 10e3/uL Final  . HGB 11/12/2015 9.4* 11.6 - 15.9 g/dL Final  . HCT 11/12/2015 29.5* 34.8 - 46.6 % Final  . Platelets 11/12/2015 146  145 - 400 10e3/uL Final  .  MCV 11/12/2015 93.1  79.5 - 101.0 fL Final  . MCH 11/12/2015 29.7  25.1 - 34.0 pg Final  . MCHC 11/12/2015 31.9  31.5 - 36.0 g/dL Final  . RBC 11/12/2015 3.17* 3.70 - 5.45 10e6/uL Final  . RDW 11/12/2015 17.9* 11.2 - 14.5 % Final  . lymph# 11/12/2015 1.6  0.9 - 3.3 10e3/uL Final  . MONO# 11/12/2015 0.5  0.1 - 0.9 10e3/uL Final  . Eosinophils Absolute 11/12/2015 0.0  0.0 - 0.5 10e3/uL Final  . Basophils Absolute 11/12/2015 0.0  0.0 - 0.1 10e3/uL Final  . NEUT% 11/12/2015 77.9* 38.4 - 76.8 % Final  . LYMPH% 11/12/2015 17.0  14.0 - 49.7 % Final  . MONO% 11/12/2015 5.0  0.0 - 14.0 % Final  . EOS% 11/12/2015 0.0  0.0 - 7.0 % Final  . BASO% 11/12/2015 0.1  0.0 - 2.0 % Final  . Sodium 11/12/2015 137  136 - 145 mEq/L Final  . Potassium 11/12/2015 4.0  3.5 - 5.1 mEq/L Final  . Chloride 11/12/2015 104  98 - 109 mEq/L Final  . CO2 11/12/2015 23  22 - 29  mEq/L Final  . Glucose 11/12/2015 140  70 - 140 mg/dl Final  . BUN 11/12/2015 15.5  7.0 - 26.0 mg/dL Final  . Creatinine 11/12/2015 1.5* 0.6 - 1.1 mg/dL Final  . Total Bilirubin 11/12/2015 0.23  0.20 - 1.20 mg/dL Final  . Alkaline Phosphatase 11/12/2015 76  40 - 150 U/L Final  . AST 11/12/2015 14  5 - 34 U/L Final  . ALT 11/12/2015 8  0 - 55 U/L Final  . Total Protein 11/12/2015 5.9* 6.4 - 8.3 g/dL Final  . Albumin 11/12/2015 3.1* 3.5 - 5.0 g/dL Final  . Calcium 11/12/2015 8.9  8.4 - 10.4 mg/dL Final  . Anion Gap 11/12/2015 10  3 - 11 mEq/L Final  . EGFR 11/12/2015 45* >90 ml/min/1.73 m2 Final  . Magnesium 11/12/2015 1.9  1.5 - 2.5 mg/dl Final  Lab on 11/10/2015  Component Date Value Ref Range Status  . Sodium 11/10/2015 140  135 - 145 mEq/L Final  . Potassium 11/10/2015 4.2  3.5 - 5.1 mEq/L Final  . Chloride 11/10/2015 104  96 - 112 mEq/L Final  . CO2 11/10/2015 24  19 - 32 mEq/L Final  . Glucose, Bld 11/10/2015 101* 70 - 99 mg/dL Final  . BUN 11/10/2015 21  6 - 23 mg/dL Final  . Creatinine, Ser 11/10/2015 1.65* 0.40 - 1.20 mg/dL Final  . Calcium 11/10/2015 9.0  8.4 - 10.5 mg/dL Final  . GFR 11/10/2015 42.15* >60.00 mL/min Final  . Hgb A1c MFr Bld 11/10/2015 6.3  4.6 - 6.5 % Final      Medication List       Accurate as of 11/15/15 11:59 PM. Always use your most recent med list.          ACCU-CHEK AVIVA PLUS test strip Generic drug:  glucose blood USE TO TEST BLOOD GLUCOSE 2 TIMES DAILY   acyclovir 800 MG tablet Commonly known as:  ZOVIRAX Take 1 tablet (800 mg total) by mouth 2 (two) times daily. Marland Kitchen   ADVAIR DISKUS 250-50 MCG/DOSE Aepb Generic drug:  Fluticasone-Salmeterol INHALE 1 PUFF INTO THE LUNGS EVERY 12 HOURS   amitriptyline 10 MG tablet Commonly known as:  ELAVIL Take 2 tablets (20 mg total) by mouth at bedtime.   CELLCEPT 250 MG capsule Generic drug:  mycophenolate Take 250 mg by mouth 2 (two) times daily. Patient takes a total  of 1000 mg daily     cetirizine 10 MG tablet Commonly known as:  ZYRTEC Take 1 tablet (10 mg total) by mouth daily.   fluconazole 200 MG tablet Commonly known as:  DIFLUCAN Take 200 mg by mouth daily.   folic acid 416 MCG tablet Commonly known as:  FOLVITE Take 400 mcg by mouth daily.   gabapentin 300 MG capsule Commonly known as:  NEURONTIN TAKE ONE CAPSULE BY MOUTH THREE TIMES DAILY   hydrochlorothiazide 12.5 MG capsule Commonly known as:  MICROZIDE Take 12.5 mg by mouth daily.   IMBRUVICA 140 MG capsul Generic drug:  ibrutinib Take 140 mg by mouth daily.   Insulin Glargine 100 UNIT/ML Solostar Pen Commonly known as:  LANTUS Inject 12 units everymore, can use sliding scale   levofloxacin 750 MG tablet Commonly known as:  LEVAQUIN Take 750 mg by mouth daily. Started 09/25 for 6 days   LORazepam 1 MG tablet Commonly known as:  ATIVAN Take 1 mg by mouth every 4 (four) hours as needed for anxiety.   Magnesium 250 MG Tabs Take 250 mg by mouth daily.   Medical Compression Socks Misc Please dispense one pair.   multivitamin tablet Take 1 tablet by mouth daily.   NOVOFINE PLUS 32G X 4 MM Misc Generic drug:  Insulin Pen Needle   NOVOLOG FLEXPEN 100 UNIT/ML FlexPen Generic drug:  insulin aspart INJECT 16 UNITS WITH EACH MEAL PLUS 18-20 EXTRA UNITS IF SUGAR IS OVER 250   ondansetron 4 MG disintegrating tablet Commonly known as:  ZOFRAN ODT Take 1 tablet (4 mg total) by mouth every 8 (eight) hours as needed for nausea.   potassium chloride SA 20 MEQ tablet Commonly known as:  K-DUR,KLOR-CON TAKE 1 TABLET (20 MEQ TOTAL) BY MOUTH DAILY.   pravastatin 20 MG tablet Commonly known as:  PRAVACHOL Take 1 tablet (20 mg total) by mouth daily.   predniSONE 10 MG tablet Commonly known as:  DELTASONE Take 20 mg by mouth daily with breakfast. Takes 1 tablet twice a day   prochlorperazine 5 MG tablet Commonly known as:  COMPAZINE Take 5 mg by mouth every 6 (six) hours as needed for nausea  or vomiting.   RAPAMUNE 0.5 MG Tabs Generic drug:  Sirolimus Take 0.5 mg by mouth daily.   RESTASIS 0.05 % ophthalmic emulsion Generic drug:  cycloSPORINE PLACE 1 DROP INTO BOTH EYES 2 TIMES DAILY.   SitaGLIPtin-MetFORMIN HCl 50-1000 MG Tb24 Commonly known as:  JANUMET XR Take 2 tablets by mouth daily.   sulfamethoxazole-trimethoprim 800-160 MG tablet Commonly known as:  BACTRIM DS,SEPTRA DS Take 1 tablet by mouth 3 (three) times a week. Monday,Wednesday, and Friday   SUMAtriptan 20 MG/ACT nasal spray Commonly known as:  IMITREX Place 1 spray (20 mg total) into the nose once. May repeat in 2 hours if headache persists or recurs.   SYSTANE 0.4-0.3 % Soln Generic drug:  Polyethyl Glycol-Propyl Glycol Place 1 drop into both eyes daily.   traMADol 50 MG tablet Commonly known as:  ULTRAM Take 1 tablet (50 mg total) by mouth every 6 (six) hours as needed for moderate pain.       Allergies:  No Known Allergies  Past Medical History:  Diagnosis Date  . ALL (acute lymphoblastic leukemia) ([place])   . Anemia   . Arthritis   . Brain bleed ([place])    07/04/11  . Diabetes mellitus without complication ([place])   . Headache(784.0)   . Hypertension   . Leukemia ([place])   .  Pneumonia   . Pulmonary embolism ([place])    06/22/11  . Thrush of mouth and esophagus ([place]) 04/09/2014    Past Surgical History:  Procedure Laterality Date  . BONE MARROW TRANSPLANT    . CESAREAN SECTION  2000  . CRANIOTOMY  07/04/11  . EYE SURGERY Right 2005   repair crossed eye  . FOOT SURGERY    . greenfield filter    . LUNG BIOPSY  06/19/13    Family History  Problem Relation Age of Onset  . Pancreatic cancer Father   . Cancer Mother     colon  . Pancreatic cancer Mother   . Asthma Son   . Hypertension Sister   . Diabetes Neg Hx   . Heart disease Neg Hx     Social History:  reports that she has never smoked. She has never used smokeless tobacco. She reports that she does not drink alcohol or use  drugs.    Review of Systems    Last foot exam was in 05/2014      Lipids: She tends to have high triglycerides and mildly increased LDL which is improved, Last below 100 She was started on pravastatin 20 mg daily in 9/16      Lab Results  Component Value Date   CHOL 219 (H) 09/07/2015   HDL 49.10 09/07/2015   LDLCALC 85 12/14/2014   LDLDIRECT 119.0 09/07/2015   TRIG 260.0 (H) 09/07/2015   CHOLHDL 4 09/07/2015    She is  taking HCTZ for hypertension, this is being prescribed by her oncologist   Has mild Decrease in renal function, somewhat variable based on her hydration status   Lab Results  Component Value Date   CREATININE 1.5 (H) 11/12/2015   BUN 15.5 11/12/2015   NA 137 11/12/2015   K 4.0 11/12/2015   CL 104 11/10/2015   CO2 23 11/12/2015     She is being [facility] at [facility] for her various problems including transplant rejection  Her liver functions have been normal Recently     Thyroid:   She had a thyroid biopsy in 6/14 for a left-sided thyroid nodule which was benign      Physical Examination:  BP 134/88   Pulse (!) 105   Temp 98.4 F (36.9 C)   Resp 16   Ht _0  (1.702 m)   Wt 222 lb (100.7 kg)   LMP 08/09/2011   SpO2 99%   BMI 34.77 kg/m   ASSESSMENT:  Diabetes type 2, uncontrolled  See history of present illness for detailed discussion of  current management, blood sugar patterns and problems identified  Currently on Janumet 1 tablet alone She has had fairly good blood sugars with A1c 6.3 She checks blood sugars very infrequently Fasting blood sugar does not appear to be high except once She was asked sporadic high readings after meals but only when she is getting larger amounts of carbohydrates or bacon meals  Her Janumet was reduced because of renal dysfunction and this is still not resolved  PLAN:  No change in Janumet to let us renal function is consistently better She was started monitoring blood sugar more consistently after  her main meal in the evening and see what the trend his If the blood sugars are consistently over 180 postprandial she can take 6-8 units of Novolog on a regular basis otherwise continue to take it with larger meals only   Patient Instructions  Check blood sugars on waking up  3x  weekly  Also check blood sugars about 2 hours after a meal and do this after different meals by rotation  Recommended blood sugar levels on waking up is 90-130 and about 2 hours after meal is 130-160  Please bring your blood sugar monitor to each visit, thank you  Take Novolog insulin 6-8 before dinner only if sugars stay > 180 after supper      , 11/16/2015, 1:43 PM   Note: This office note was prepared with Estate agent. Any transcriptional errors that result from this process are unintentional.

## 2015-11-16 ENCOUNTER — Ambulatory Visit: Payer: Medicare Other | Admitting: Adult Health

## 2015-11-16 ENCOUNTER — Ambulatory Visit (INDEPENDENT_AMBULATORY_CARE_PROVIDER_SITE_OTHER): Payer: Medicare Other | Admitting: Nurse Practitioner

## 2015-11-16 ENCOUNTER — Encounter: Payer: Self-pay | Admitting: Nurse Practitioner

## 2015-11-16 VITALS — BP 125/80 | HR 104 | Ht 67.0 in | Wt 222.0 lb

## 2015-11-16 DIAGNOSIS — R51 Headache: Secondary | ICD-10-CM | POA: Diagnosis not present

## 2015-11-16 DIAGNOSIS — R519 Headache, unspecified: Secondary | ICD-10-CM | POA: Insufficient documentation

## 2015-11-16 DIAGNOSIS — G8929 Other chronic pain: Secondary | ICD-10-CM

## 2015-11-16 MED ORDER — SUMATRIPTAN 20 MG/ACT NA SOLN: 20.0000 mg | Freq: Once | NASAL | 6 refills | Status: DC

## 2015-11-16 MED ORDER — AMITRIPTYLINE HCL 10 MG PO TABS: 20.0000 mg | ORAL_TABLET | Freq: Every day | ORAL | 8 refills | Status: DC

## 2015-11-16 NOTE — Progress Notes (Signed)
[facility]  PATIENT: Lynn [name] DOB: [birth year]   REASON FOR VISIT: Follow-up for headache, HISTORY FROM: Patient    HISTORY OF PRESENT ILLNESS:UPDATE 10/17/2017CM Lynn [name], 50 year old female returns for follow-up. She has a history of headaches which have been fairly well controlled on her Depakote was discontinued at her last visit. She remains on Elavil and Imitrex acutely. She describes her headaches as manageable. She has a history of bone marrow  transplant 2, last in 2015. On her follow-up visit last week to [facility], her magnesium level was low and she was dehydrated so she received IV fluids. She returns for reevaluation    05/06/2015.CM Lynn [name] 50 year old female returns for follow-up. She was last in the office 02/04/2015 by Dr. Jaynee [name]. At that time she was having worsening of her headaches, more problems with her vision, more difficulty with photosensitivity. She was placed on Depakote in addition to her amitriptyline but she is only taking the Depakote prn. Imitrex continues to work acutely for her. Her prior symptoms are much better, her vision is better without blurring and she describes her headaches as very mild and tolerable. MRI of the brain 02/11/2015 without acute findings and no change from MRI dated March 2014.She is not aware of any foods that are specific for migraine trigger for her.She returns for reevaluation.previous records reviewed     Lynn [name] is a 50 y.o. female here as a follow up. She is a former patient of Dr. Leonie [name] and is transitioning to me. She has chronic daily headaches since June 2013 following intracerebral hemorrhage and treatment with chemotherapy for leukemia. She is still on Amitriptyline which works well. She is having shooting pain on the left(points to the occipital area). Happens a few days a week. Sharp, brief, throbbing, lightning bolt. It doesn't happen all day. It is a few times a week. Sesitive to  the touch. She has tenderness a few hours a day in another spot where the drain was, feels like pressure, sore and tender to the touch on the right frontal area behind the hairline. The pain started this winter. 3-4 times a week. She has taken ibuprofen and it doesn't really help.   Interval history 02/04/2015: Headaches. She is having at 4 a week. They can last at least several hours . Lorazepam helps. Starts on the left, a dull pain, then spreads to the front, has nausea, no vomiting, here eyes are really dry and this is exacerbating the headaches, she has been to ophthalmology for her eyes. She is on restasis drops. Light is bothering her since her eyes are dry. She was doing well but within the last few monthe the headaches have returned. She prefers to try a new medication instead of increasing Amitriptyline. She is feeling Depressed and having difficulty sleeping due to her medical conditions. She just labs completed and liver function was normal but creatinine is still elevated. Worse with laying down.   Dr. Leonie [name] and Lynn [name] 09/15/2013: She returns for followup after her initial consultation with me on 04/09/12. She did not tolerate Topamax as it did not help and hence stopped it after a few weeks. She in fact develop worsening headache and was hospitalized at [facility] with headache and fever he had Topamax was discontinued and she was started on amitriptyline 10 mg at night. She was also given Phenergan for nausea seems to be working quite well. She also states that the chemotherapy dose has been reduced and that  may have helped as the headaches have practically disappeared. She hasn't had headache only one day in the last 4 weeks. She did discontinue the Imitrex , oxycodone and Fioricet. 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 Lab work done on  04/09/12 showed normal ESR, ANA panel, B12, blood chemistries. TSH was slightly suppressed at 0.44miu/ml.   Ms. HKendrareturns for headache revisit, states her headaches have been manageable, except she had one severe headache in July with nausea, vomiting and diarrhea, tested positive for cdiff. Feeling better now. Tolerating Elavil at night. Has new complaint os tinging in medial thighs when she puts chin to chest. No associated pain or numbness.  i  REVIEW OF SYSTEMS: Full 14 system review of systems performed and notable only for those listed, all others are neg:  Constitutional: fatigue  Cardiovascular: Neg Ear/Nose/Throat: neg  Skin: neg Eyes: Light sensitivity Respiratory: neg Gastroitestinal: neg  Hematology/Lymphatic: Anemia  Endocrine: neg Musculoskeletal:neg Allergy/Immunology: neg Neurological: history of chronic headaches Psychiatric: neg Sleep : neg   ALLERGIES: No Known Allergies  HOME MEDICATIONS: Outpatient Medications Prior to Visit  Medication Sig Dispense Refill  . ACCU-CHEK AVIVA PLUS test strip USE TO TEST BLOOD GLUCOSE 2 TIMES DAILY  5  . acyclovir (ZOVIRAX) 800 MG tablet Take 1 tablet (800 mg total) by mouth 2 (two) times daily. . 60 tablet 5  . ADVAIR DISKUS 250-50 MCG/DOSE AEPB INHALE 1 PUFF INTO THE LUNGS EVERY 12 HOURS  5  . amitriptyline (ELAVIL) 10 MG tablet Take 2 tablets (20 mg total) by mouth at bedtime. 180 tablet 5  . cetirizine (ZYRTEC) 10 MG tablet Take 1 tablet (10 mg total) by mouth daily. (Patient taking differently: Take 10 mg by mouth daily as needed for allergies. ) 30 tablet 0  . Elastic Bandages & Supports (MEDICAL COMPRESSION SOCKS) MISC Please dispense one pair. 2 each 0  . fluconazole (DIFLUCAN) 200 MG tablet Take 200 mg by mouth daily.     . folic acid (FOLVITE) 4315MCG tablet Take 400 mcg by mouth daily.    .Marland Kitchengabapentin (NEURONTIN) 300 MG capsule TAKE ONE CAPSULE BY MOUTH THREE TIMES DAILY (Patient taking differently: TAKE ONE  CAPSULE BY MOUTH THREE TIMES DAILY as needed for nerve pain) 90 capsule 5  . hydrochlorothiazide (MICROZIDE) 12.5 MG capsule Take 12.5 mg by mouth daily.    . IMBRUVICA 140 MG capsul Take 140 mg by mouth daily.     . Insulin Glargine (LANTUS) 100 UNIT/ML Solostar Pen Inject 12 units everymore, can use sliding scale 15 mL 3  . LORazepam (ATIVAN) 1 MG tablet Take 1 mg by mouth every 4 (four) hours as needed for anxiety.     . Magnesium 250 MG TABS Take 250 mg by mouth daily.    . Multiple Vitamin (MULTIVITAMIN) tablet Take 1 tablet by mouth daily.    . mycophenolate (CELLCEPT) 250 MG capsule Take 250 mg by mouth 2 (two) times daily. Patient takes a total of 500 mg daily    . NOVOFINE PLUS 32G X 4 MM MISC     . NOVOLOG FLEXPEN 100 UNIT/ML FlexPen INJECT 16 UNITS WITH EACH MEAL PLUS 18-20 EXTRA UNITS IF SUGAR IS OVER 250 (Patient taking differently: INJECT 16 UNITS WITH EACH MEAL PLUS 18-20 EXTRA UNITS IF SUGAR IS OVER 260) 30 mL 3  . ondansetron (ZOFRAN ODT)  4 MG disintegrating tablet Take 1 tablet (4 mg total) by mouth every 8 (eight) hours as needed for nausea. 30 tablet 1  . Polyethyl Glycol-Propyl Glycol (SYSTANE) 0.4-0.3 % SOLN Place 1 drop into both eyes daily.    . potassium chloride SA (K-DUR,KLOR-CON) 20 MEQ tablet TAKE 1 TABLET (20 MEQ TOTAL) BY MOUTH DAILY. 30 tablet 2  . pravastatin (PRAVACHOL) 20 MG tablet Take 1 tablet (20 mg total) by mouth daily. 90 tablet 0  . predniSONE (DELTASONE) 10 MG tablet Take 20 mg by mouth daily with breakfast. Takes 1 tablet twice a day    . prochlorperazine (COMPAZINE) 5 MG tablet Take 5 mg by mouth every 6 (six) hours as needed for nausea or vomiting.     . RESTASIS 0.05 % ophthalmic emulsion PLACE 1 DROP INTO BOTH EYES 2 TIMES DAILY.  11  . Sirolimus (RAPAMUNE) 0.5 MG TABS Take 0.5 mg by mouth daily.    . SitaGLIPtin-MetFORMIN HCl (JANUMET XR) 50-1000 MG TB24 Take 2 tablets by mouth daily. (Patient taking differently: Take 2 tablets by mouth every  evening. ) 180 tablet 3  . sulfamethoxazole-trimethoprim (BACTRIM DS,SEPTRA DS) 800-160 MG per tablet Take 1 tablet by mouth 3 (three) times a week. Monday,Wednesday, and Friday 30 tablet 0  . SUMAtriptan (IMITREX) 20 MG/ACT nasal spray Place 1 spray (20 mg total) into the nose once. May repeat in 2 hours if headache persists or recurs. 10 Inhaler 6  . traMADol (ULTRAM) 50 MG tablet Take 1 tablet (50 mg total) by mouth every 6 (six) hours as needed for moderate pain. 30 tablet 0  . levofloxacin (LEVAQUIN) 750 MG tablet Take 750 mg by mouth daily. Started 09/25 for 6 days     No facility-administered medications prior to visit.     PAST MEDICAL HISTORY: Past Medical History:  Diagnosis Date  . ALL (acute lymphoblastic leukemia) ([place])   . Anemia   . Arthritis   . Brain bleed ([place])    07/04/11  . Diabetes mellitus without complication ([place])   . Headache(784.0)   . Hypertension   . Leukemia ([place])   . Pneumonia   . Pulmonary embolism ([place])    06/22/11  . Thrush of mouth and esophagus ([place]) 04/09/2014    PAST SURGICAL HISTORY: Past Surgical History:  Procedure Laterality Date  . BONE MARROW TRANSPLANT    . CESAREAN SECTION  2000  . CRANIOTOMY  07/04/11  . EYE SURGERY Right 2005   repair crossed eye  . FOOT SURGERY    . greenfield filter    . LUNG BIOPSY  06/19/13    FAMILY HISTORY: Family History  Problem Relation Age of Onset  . Pancreatic cancer Father   . Cancer Mother     colon  . Pancreatic cancer Mother   . Asthma Son   . Hypertension Sister   . Diabetes Neg Hx   . Heart disease Neg Hx     SOCIAL HISTORY: Social History   Social History  . Marital status: Divorced    Spouse name: N/A  . Number of children: 2  . Years of education: Associates   Occupational History  . N/A    Social History Main Topics  . Smoking status: Never Smoker  . Smokeless tobacco: Never Used  . Alcohol use No  . Drug use: No  . Sexual activity: Not Currently    Birth control/  protection: Abstinence, None   Other Topics Concern  . Not on file   Social  History Narrative   Pt lives with her 2 children.    Associates degree   Caffeine Use: 8oz coffee/day      PHYSICAL EXAM  Vitals:   11/16/15 1447  BP: 125/80  Pulse: (!) 104  Weight: 222 lb (100.7 kg)  Height: '5\' 7"'  (1.702 m)   Body mass index is 34.77 kg/m.  Generalized: Well developed, obese female in no acute distress  Head: normocephalic and atraumatic,. Oropharynx benign  Neck: Supple, no carotid bruits  Cardiac: Regular rate rhythm, no murmur  Musculoskeletal: No deformity   Neurological examination   Mentation: Alert oriented to time, place, history taking. Attention span and concentration appropriate. Recent and remote memory intact.  Follows all commands speech and language fluent.   Cranial nerve II-XII: Pupils were equal round reactive to light extraocular movements were full, visual field were full on confrontational test. Facial sensation and strength were normal. hearing was intact to finger rubbing bilaterally. Uvula tongue midline. head turning and shoulder shrug were normal and symmetric.Tongue protrusion into cheek strength was normal. Motor: normal bulk and tone, full strength in the BUE, BLE, fine finger movements normal, no pronator drift. No focal weakness Sensory: normal and symmetric to light touch, on the face arms and legs Coordination: finger-nose-finger, heel-to-shin bilaterally, no dysmetria Reflexes: Brachioradialis 2/2, biceps 2/2, triceps 2/2, patellar 2/2, Achilles 2/2, plantar responses were flexor bilaterally. Gait and Station: Rising up from seated position without assistance, normal stance,  moderate stride, good arm swing, smooth turning, able to perform tiptoe, and heel walking without difficulty. Tandem gait is steady  DIAGNOSTIC DATA (LABS, IMAGING, TESTING) -    Component Value Date/Time   NA 137 11/12/2015 0937   K 4.0 11/12/2015 0937   CL 104  11/10/2015 0800   CL 106 07/09/2012 0949   CO2 23 11/12/2015 0937   GLUCOSE 140 11/12/2015 0937   GLUCOSE 109 (H) 07/09/2012 0949   BUN 15.5 11/12/2015 0937   CREATININE 1.5 (H) 11/12/2015 0937   CALCIUM 8.9 11/12/2015 0937   PROT 5.9 (L) 11/12/2015 0937   ALBUMIN 3.1 (L) 11/12/2015 0937   AST 14 11/12/2015 0937   ALT 8 11/12/2015 0937   ALKPHOS 76 11/12/2015 0937   BILITOT 0.23 11/12/2015 0937   GFRNONAA 28 (L) 10/29/2015 1558   GFRAA 33 (L) 10/29/2015 1558    Lab Results  Component Value Date   HGBA1C 6.3 11/10/2015    ASSESSMENT AND PLAN  49 y.o. year old female  has a past medical history of chronic headache since June 2013 which have improved with amitriptyline. She is having  headaches which are mild and tolerable. Imitrex still works acutely.MRI of the brain 02/11/2015 without acute findings and no change from MRI dated March 2014.The patient is a current patient of Dr. Jaynee [name] who is out of the office today . This note is sent to the work in doctor.     PLAN: Continue Elavil 10 mg 2 tablets at bedtime will refill Continue Imitrex acutely Call for increase in headaches F/U in 8 months Dennie Bible, [facility], [facility], [facility] [address], [place], [place] [address] [phone]

## 2015-11-16 NOTE — Patient Instructions (Signed)
Continue Elavil 10 mg 2 tablets at bedtime Continue Imitrex acutely Call for increase in headaches F/U in 8 months

## 2015-12-07 DIAGNOSIS — R739 Hyperglycemia, unspecified: Secondary | ICD-10-CM | POA: Diagnosis not present

## 2015-12-07 DIAGNOSIS — Z9484 Stem cells transplant status: Secondary | ICD-10-CM | POA: Diagnosis not present

## 2015-12-07 DIAGNOSIS — C9101 Acute lymphoblastic leukemia, in remission: Secondary | ICD-10-CM | POA: Diagnosis not present

## 2015-12-07 DIAGNOSIS — T380X5A Adverse effect of glucocorticoids and synthetic analogues, initial encounter: Secondary | ICD-10-CM | POA: Diagnosis not present

## 2015-12-07 DIAGNOSIS — D89811 Chronic graft-versus-host disease: Secondary | ICD-10-CM | POA: Diagnosis not present

## 2015-12-07 DIAGNOSIS — Z23 Encounter for immunization: Secondary | ICD-10-CM | POA: Diagnosis not present

## 2015-12-07 DIAGNOSIS — C9100 Acute lymphoblastic leukemia not having achieved remission: Secondary | ICD-10-CM | POA: Diagnosis not present

## 2015-12-15 DIAGNOSIS — R350 Frequency of micturition: Secondary | ICD-10-CM | POA: Diagnosis not present

## 2015-12-20 DIAGNOSIS — N390 Urinary tract infection, site not specified: Secondary | ICD-10-CM | POA: Diagnosis not present

## 2015-12-20 DIAGNOSIS — R197 Diarrhea, unspecified: Secondary | ICD-10-CM | POA: Diagnosis not present

## 2015-12-21 DIAGNOSIS — R197 Diarrhea, unspecified: Secondary | ICD-10-CM | POA: Diagnosis not present

## 2015-12-27 DIAGNOSIS — H04123 Dry eye syndrome of bilateral lacrimal glands: Secondary | ICD-10-CM | POA: Diagnosis not present

## 2015-12-27 DIAGNOSIS — D89811 Chronic graft-versus-host disease: Secondary | ICD-10-CM | POA: Diagnosis not present

## 2015-12-27 DIAGNOSIS — H04129 Dry eye syndrome of unspecified lacrimal gland: Secondary | ICD-10-CM | POA: Diagnosis not present

## 2016-01-10 DIAGNOSIS — R030 Elevated blood-pressure reading, without diagnosis of hypertension: Secondary | ICD-10-CM | POA: Diagnosis not present

## 2016-01-10 DIAGNOSIS — R509 Fever, unspecified: Secondary | ICD-10-CM | POA: Diagnosis not present

## 2016-01-11 DIAGNOSIS — Z9221 Personal history of antineoplastic chemotherapy: Secondary | ICD-10-CM | POA: Diagnosis not present

## 2016-01-11 DIAGNOSIS — R509 Fever, unspecified: Secondary | ICD-10-CM | POA: Diagnosis not present

## 2016-01-11 DIAGNOSIS — I10 Essential (primary) hypertension: Secondary | ICD-10-CM | POA: Diagnosis not present

## 2016-01-11 DIAGNOSIS — D89811 Chronic graft-versus-host disease: Secondary | ICD-10-CM | POA: Diagnosis not present

## 2016-01-11 DIAGNOSIS — C9101 Acute lymphoblastic leukemia, in remission: Secondary | ICD-10-CM | POA: Diagnosis not present

## 2016-01-11 DIAGNOSIS — T380X5A Adverse effect of glucocorticoids and synthetic analogues, initial encounter: Secondary | ICD-10-CM | POA: Diagnosis not present

## 2016-01-11 DIAGNOSIS — Z9484 Stem cells transplant status: Secondary | ICD-10-CM | POA: Diagnosis not present

## 2016-01-11 DIAGNOSIS — R05 Cough: Secondary | ICD-10-CM | POA: Diagnosis not present

## 2016-01-11 DIAGNOSIS — E1165 Type 2 diabetes mellitus with hyperglycemia: Secondary | ICD-10-CM | POA: Diagnosis not present

## 2016-01-11 DIAGNOSIS — Z79899 Other long term (current) drug therapy: Secondary | ICD-10-CM | POA: Diagnosis not present

## 2016-01-11 DIAGNOSIS — D899 Disorder involving the immune mechanism, unspecified: Secondary | ICD-10-CM | POA: Diagnosis not present

## 2016-01-11 DIAGNOSIS — Z794 Long term (current) use of insulin: Secondary | ICD-10-CM | POA: Diagnosis not present

## 2016-01-31 ENCOUNTER — Other Ambulatory Visit: Payer: Self-pay | Admitting: Neurology

## 2016-02-10 ENCOUNTER — Other Ambulatory Visit (INDEPENDENT_AMBULATORY_CARE_PROVIDER_SITE_OTHER): Payer: Medicare Other

## 2016-02-10 DIAGNOSIS — E1165 Type 2 diabetes mellitus with hyperglycemia: Secondary | ICD-10-CM | POA: Diagnosis not present

## 2016-02-10 DIAGNOSIS — N12 Tubulo-interstitial nephritis, not specified as acute or chronic: Secondary | ICD-10-CM | POA: Diagnosis not present

## 2016-02-10 DIAGNOSIS — Z794 Long term (current) use of insulin: Secondary | ICD-10-CM

## 2016-02-10 DIAGNOSIS — R509 Fever, unspecified: Secondary | ICD-10-CM | POA: Diagnosis not present

## 2016-02-10 LAB — COMPREHENSIVE METABOLIC PANEL
ALT: 49 U/L — ABNORMAL HIGH (ref 0–35)
AST: 54 U/L — ABNORMAL HIGH (ref 0–37)
Albumin: 3.7 g/dL (ref 3.5–5.2)
Alkaline Phosphatase: 158 U/L — ABNORMAL HIGH (ref 39–117)
BUN: 18 mg/dL (ref 6–23)
CALCIUM: 9.4 mg/dL (ref 8.4–10.5)
CHLORIDE: 96 meq/L (ref 96–112)
CO2: 28 meq/L (ref 19–32)
CREATININE: 1.61 mg/dL — AB (ref 0.40–1.20)
GFR: 43.32 mL/min — ABNORMAL LOW (ref >60.00)
GLUCOSE: 158 mg/dL — AB (ref 70–99)
Potassium: 3.3 mEq/L — ABNORMAL LOW (ref 3.5–5.1)
SODIUM: 135 meq/L (ref 135–145)
Total Bilirubin: 0.3 mg/dL (ref 0.2–1.2)
Total Protein: 6.9 g/dL (ref 6.0–8.3)

## 2016-02-10 LAB — LIPID PANEL
CHOL/HDL RATIO: 4
Cholesterol: 250 mg/dL — ABNORMAL HIGH (ref 0–200)
HDL: 65.8 mg/dL (ref >39.00)
NONHDL: 184.14
TRIGLYCERIDES: 220 mg/dL — AB (ref 0.0–149.0)
VLDL: 44 mg/dL — ABNORMAL HIGH (ref 0.0–40.0)

## 2016-02-10 LAB — HEMOGLOBIN A1C: HEMOGLOBIN A1C: 7.3 % — AB (ref 4.6–6.5)

## 2016-02-10 LAB — LDL CHOLESTEROL, DIRECT: LDL DIRECT: 137 mg/dL

## 2016-02-15 ENCOUNTER — Ambulatory Visit: Payer: Medicare Other | Admitting: Endocrinology

## 2016-02-17 NOTE — Progress Notes (Signed)
Please let patient know that the potassium result is low, to call PCP

## 2016-03-06 ENCOUNTER — Other Ambulatory Visit (INDEPENDENT_AMBULATORY_CARE_PROVIDER_SITE_OTHER): Payer: Medicare Other

## 2016-03-06 DIAGNOSIS — E1165 Type 2 diabetes mellitus with hyperglycemia: Secondary | ICD-10-CM | POA: Diagnosis not present

## 2016-03-06 DIAGNOSIS — Z794 Long term (current) use of insulin: Secondary | ICD-10-CM | POA: Diagnosis not present

## 2016-03-06 LAB — COMPREHENSIVE METABOLIC PANEL
ALT: 20 U/L (ref 0–35)
AST: 22 U/L (ref 0–37)
Albumin: 3.9 g/dL (ref 3.5–5.2)
Alkaline Phosphatase: 81 U/L (ref 39–117)
BUN: 23 mg/dL (ref 6–23)
CHLORIDE: 103 meq/L (ref 96–112)
CO2: 26 meq/L (ref 19–32)
Calcium: 9 mg/dL (ref 8.4–10.5)
Creatinine, Ser: 1.44 mg/dL — ABNORMAL HIGH (ref 0.40–1.20)
GFR: 49.26 mL/min — ABNORMAL LOW (ref >60.00)
Glucose, Bld: 203 mg/dL — ABNORMAL HIGH (ref 70–99)
POTASSIUM: 3.9 meq/L (ref 3.5–5.1)
SODIUM: 137 meq/L (ref 135–145)
Total Bilirubin: 0.2 mg/dL (ref 0.2–1.2)
Total Protein: 6.4 g/dL (ref 6.0–8.3)

## 2016-03-07 LAB — FRUCTOSAMINE: Fructosamine: 224 umol/L (ref 0–285)

## 2016-03-10 ENCOUNTER — Ambulatory Visit (INDEPENDENT_AMBULATORY_CARE_PROVIDER_SITE_OTHER): Payer: Medicare Other | Admitting: Endocrinology

## 2016-03-10 ENCOUNTER — Encounter: Payer: Self-pay | Admitting: Endocrinology

## 2016-03-10 VITALS — BP 120/84 | HR 105 | Ht 67.0 in | Wt 224.0 lb

## 2016-03-10 DIAGNOSIS — E1165 Type 2 diabetes mellitus with hyperglycemia: Secondary | ICD-10-CM

## 2016-03-10 NOTE — Patient Instructions (Addendum)
CHECK SUGAR DAILY at various times  Check blood sugars on waking up  2x weekly  Also check blood sugars about 2 hours after a meal and do this after different meals by rotation  Recommended blood sugar levels on waking up is 90-130 and about 2 hours after meal is 130-160  Please bring your blood sugar monitor to each visit, thank you

## 2016-03-10 NOTE — Progress Notes (Signed)
Patient ID: Lynn [name], female   DOB: 06/15/1965, 51 y.o.   MRN: [id number]    Reason for Appointment: Followup for Type 2 Diabetes  Referring physician: Donnie Coffin  History of Present Illness:          Diagnosis: Type 2 diabetes mellitus, date of diagnosis: 2013        Past history: She weighed nearly 300 lb in 2013 around that time her diabetes was diagnosed Although her A1c at that time was reported at 7.4 she does not remember being told about diabetes and no treatment was given In 5/15 when she was getting steroids for her bone marrow transplant her blood sugar was over 400 She was started on insulin and had been taking a basal bolus insulin regimen subsequently while on prednisone Although she required small amounts of insulin she could not taper off insulin even when off prednisone  Recent history:    INSULIN regimen is NovoLog prn  She is now on Prednisone 10 mg bid    Currently she is taking only 1 tablet of Janumet XR without side effects   A1c is higher at 7.3 last month or month previously as low as 6% Her fructosamine is 224 this month  Current management, blood sugar patterns and problems:  Her blood sugars are being checked very sporadically again  Does not have any fasting blood sugars  She has a couple of readings after breakfast which are not high as a couple of readings after lunch and dinner which are somewhat increased along with high reading after a meal last month   have been fairly close to normal but is checking infrequently  She had only one high reading of 171 midmorning, not clear if this was postprandial  She has only a couple of readings after her evening meal of 69 and 178  Highest reading was 271 at about 5 PM  She has not taken Novolog except when she is planning to eat a big meal.  She has however started walking more regularly recently and has felt better with taking prednisone       Oral hypoglycemic drugs the  patient is taking are: Janumet XR once a day    Side effects from medications have been: Diarrhea from Metformin over 1000 mg dose  Glucose monitoring:  done less than 1 time a day         Glucometer:  Accu-Chek   Only 8 readings available  AVERAGE glucose 132, higher readings twice, 189 after lunch and 199 after evening meal last month ?  Fasting reading not available  Glycemic control:   Lab Results  Component Value Date   HGBA1C 7.3 (H) 02/10/2016   HGBA1C 6.3 11/10/2015   HGBA1C 6.0 07/07/2015   Lab Results  Component Value Date   MICROALBUR 1.6 08/10/2014   LDLCALC 85 12/14/2014   CREATININE 1.44 (H) 03/06/2016    Self-care: The diet that the patient has been following is: tries to limit meal size  Meals: 2 meals per day.  with breakfast at 11 am. Supper 5 pm   Exercise: walking a little Dietician visit: Most recent: 5/15 at the hospital              Compliance with the medical regimen: Fair  Weight history:  Wt Readings from Last 3 Encounters:  03/10/16 224 lb (101.6 kg)  11/16/15 222 lb (100.7 kg)  11/15/15 222 lb (100.7 kg)   Lab on 03/06/2016  Component Date  Value Ref Range Status  . Sodium 03/06/2016 137  135 - 145 mEq/L Final  . Potassium 03/06/2016 3.9  3.5 - 5.1 mEq/L Final  . Chloride 03/06/2016 103  96 - 112 mEq/L Final  . CO2 03/06/2016 26  19 - 32 mEq/L Final  . Glucose, Bld 03/06/2016 203* 70 - 99 mg/dL Final  . BUN 03/06/2016 23  6 - 23 mg/dL Final  . Creatinine, Ser 03/06/2016 1.44* 0.40 - 1.20 mg/dL Final  . Total Bilirubin 03/06/2016 0.2  0.2 - 1.2 mg/dL Final  . Alkaline Phosphatase 03/06/2016 81  39 - 117 U/L Final  . AST 03/06/2016 22  0 - 37 U/L Final  . ALT 03/06/2016 20  0 - 35 U/L Final  . Total Protein 03/06/2016 6.4  6.0 - 8.3 g/dL Final  . Albumin 03/06/2016 3.9  3.5 - 5.2 g/dL Final  . Calcium 03/06/2016 9.0  8.4 - 10.5 mg/dL Final  . GFR 03/06/2016 49.26* >60.00 mL/min Final  . Fructosamine 03/06/2016 224  0 - 285 umol/L Final    Comment: Published reference interval for apparently healthy subjects between age 44 and 22 is 28 - 285 umol/L and in a poorly controlled diabetic population is 228 - 563 umol/L with a mean of 396 umol/L.     Allergies as of 03/10/2016   No Known Allergies     Medication List       Accurate as of 03/10/16  2:25 PM. Always use your most recent med list.          ACCU-CHEK AVIVA PLUS test strip Generic drug:  glucose blood USE TO TEST BLOOD GLUCOSE 2 TIMES DAILY   acyclovir 800 MG tablet Commonly known as:  ZOVIRAX Take 1 tablet (800 mg total) by mouth 2 (two) times daily. Marland Kitchen   ADVAIR DISKUS 250-50 MCG/DOSE Aepb Generic drug:  Fluticasone-Salmeterol INHALE 1 PUFF INTO THE LUNGS EVERY 12 HOURS   amitriptyline 10 MG tablet Commonly known as:  ELAVIL Take 2 tablets (20 mg total) by mouth at bedtime.   CELLCEPT 250 MG capsule Generic drug:  mycophenolate Take 250 mg by mouth 2 (two) times daily. Patient takes a total of 500 mg daily   cetirizine 10 MG tablet Commonly known as:  ZYRTEC Take 1 tablet (10 mg total) by mouth daily.   fluconazole 200 MG tablet Commonly known as:  DIFLUCAN Take 200 mg by mouth daily.   folic acid A999333 MCG tablet Commonly known as:  FOLVITE Take 400 mcg by mouth daily.   gabapentin 300 MG capsule Commonly known as:  NEURONTIN TAKE ONE CAPSULE BY MOUTH THREE TIMES DAILY   hydrochlorothiazide 12.5 MG capsule Commonly known as:  MICROZIDE Take 12.5 mg by mouth daily.   IMBRUVICA 140 MG capsul Generic drug:  ibrutinib Take 140 mg by mouth daily. Take 2 tablets once daily   Insulin Glargine 100 UNIT/ML Solostar Pen Commonly known as:  LANTUS Inject 12 units everymore, can use sliding scale   LORazepam 1 MG tablet Commonly known as:  ATIVAN Take 1 mg by mouth every 4 (four) hours as needed for anxiety.   Magnesium 250 MG Tabs Take 250 mg by mouth daily.   Medical Compression Socks Misc Please dispense one pair.   multivitamin  tablet Take 1 tablet by mouth daily.   NOVOFINE PLUS 32G X 4 MM Misc Generic drug:  Insulin Pen Needle   NOVOLOG FLEXPEN 100 UNIT/ML FlexPen Generic drug:  insulin aspart INJECT 16 UNITS WITH EACH MEAL  PLUS 18-20 EXTRA UNITS IF SUGAR IS OVER 250   ondansetron 4 MG disintegrating tablet Commonly known as:  ZOFRAN ODT Take 1 tablet (4 mg total) by mouth every 8 (eight) hours as needed for nausea.   potassium chloride SA 20 MEQ tablet Commonly known as:  K-DUR,KLOR-CON TAKE 1 TABLET (20 MEQ TOTAL) BY MOUTH DAILY.   pravastatin 20 MG tablet Commonly known as:  PRAVACHOL Take 1 tablet (20 mg total) by mouth daily.   predniSONE 10 MG tablet Commonly known as:  DELTASONE Take 20 mg by mouth daily with breakfast. Takes 1 tablet twice a day   prochlorperazine 5 MG tablet Commonly known as:  COMPAZINE Take 5 mg by mouth every 6 (six) hours as needed for nausea or vomiting.   RAPAMUNE 0.5 MG Tabs Generic drug:  Sirolimus Take 0.5 mg by mouth daily.   RESTASIS 0.05 % ophthalmic emulsion Generic drug:  cycloSPORINE PLACE 1 DROP INTO BOTH EYES 2 TIMES DAILY.   SitaGLIPtin-MetFORMIN HCl 50-1000 MG Tb24 Commonly known as:  JANUMET XR Take 2 tablets by mouth daily.   sulfamethoxazole-trimethoprim 800-160 MG tablet Commonly known as:  BACTRIM DS,SEPTRA DS Take 1 tablet by mouth 3 (three) times a week. Monday,Wednesday, and Friday   SUMAtriptan 20 MG/ACT nasal spray Commonly known as:  IMITREX Place 1 spray (20 mg total) into the nose once. May repeat in 2 hours if headache persists or recurs.   SYSTANE 0.4-0.3 % Soln Generic drug:  Polyethyl Glycol-Propyl Glycol Place 1 drop into both eyes daily.   traMADol 50 MG tablet Commonly known as:  ULTRAM Take 1 tablet (50 mg total) by mouth every 6 (six) hours as needed for moderate pain.       Allergies:  No Known Allergies  Past Medical History:  Diagnosis Date  . ALL (acute lymphoblastic leukemia) ([place])   . Anemia   .  Arthritis   . Brain bleed ([place])    07/04/11  . Diabetes mellitus without complication ([place])   . Headache(784.0)   . Hypertension   . Leukemia ([place])   . Pneumonia   . Pulmonary embolism ([place])    06/22/11  . Thrush of mouth and esophagus ([place]) 04/09/2014    Past Surgical History:  Procedure Laterality Date  . BONE MARROW TRANSPLANT    . CESAREAN SECTION  2000  . CRANIOTOMY  07/04/11  . EYE SURGERY Right 2005   repair crossed eye  . FOOT SURGERY    . greenfield filter    . LUNG BIOPSY  06/19/13    Family History  Problem Relation Age of Onset  . Pancreatic cancer Father   . Cancer Mother     colon  . Pancreatic cancer Mother   . Asthma Son   . Hypertension Sister   . Diabetes Neg Hx   . Heart disease Neg Hx     Social History:  reports that she has never smoked. She has never used smokeless tobacco. She reports that she does not drink alcohol or use drugs.    Review of Systems    Last foot exam was in 05/2014      Lipids: She tends to have high triglycerides and mildly increased LDL which is improved, Last below 100 She was started on pravastatin 20 mg daily in 9/16      Lab Results  Component Value Date   CHOL 250 (H) 02/10/2016   HDL 65.80 02/10/2016   LDLCALC 85 12/14/2014   LDLDIRECT 137.0 02/10/2016  TRIG 220.0 (H) 02/10/2016   CHOLHDL 4 02/10/2016    She is  taking HCTZ for hypertension, this is being prescribed by her oncologist   Has mild Decrease in renal function, somewhat variable based on her hydration status   Lab Results  Component Value Date   CREATININE 1.44 (H) 03/06/2016   BUN 23 03/06/2016   NA 137 03/06/2016   K 3.9 03/06/2016   CL 103 03/06/2016   CO2 26 03/06/2016     She is being [facility] at [facility] for her various problems including transplant rejection  Her liver functions have been normal Recently     Thyroid:   She had a thyroid biopsy in 6/14 for a left-sided thyroid nodule which was benign      Physical  Examination:  BP 120/84   Pulse (!) 105   Ht 5\' 7"  (1.702 m)   Wt 224 lb (101.6 kg)   LMP 08/09/2011   SpO2 97%   BMI 35.08 kg/m   Diabetic Foot Exam - Simple   Simple Foot Form Diabetic Foot exam was performed with the following findings:  Yes 03/10/2016  1:26 PM  Visual Inspection No deformities, no ulcerations, no other skin breakdown bilaterally:  Yes Sensation Testing Intact to touch and monofilament testing bilaterally:  Yes Pulse Check Posterior Tibialis and Dorsalis pulse intact bilaterally:  Yes Comments Mild left lower leg and pedal edema     ASSESSMENT:  Diabetes type 2, uncontrolled  See history of present illness for detailed discussion of  current management, blood sugar patterns and problems identified  She is still on Janumet 1 tablet alone Last month her A1c was higher at 7.3 but fructosamine is in the normal range now  Not clear why her sugars were higher periodically and may be related to intercurrent illness, not taking any more prednisone than usual   PLAN:  No change in Janumet as yet, may need to consider 2 tablets daily especially as renal function is improving Emphasized the need to have more blood sugar readings consistently especially prior to her visit She needs to have balanced meals with some protein and not too much carbohydrate Encourage her to walk more consistently    Patient Instructions  CHECK SUGAR DAILY at various times  Check blood sugars on waking up  2x weekly  Also check blood sugars about 2 hours after a meal and do this after different meals by rotation  Recommended blood sugar levels on waking up is 90-130 and about 2 hours after meal is 130-160  Please bring your blood sugar monitor to each visit, thank you        [facility] 03/10/2016, 2:25 PM   Note: This office note was prepared with Dragon voice recognition system technology. Any transcriptional errors that result from this process are unintentional.

## 2016-03-14 DIAGNOSIS — R509 Fever, unspecified: Secondary | ICD-10-CM | POA: Diagnosis not present

## 2016-03-14 DIAGNOSIS — T380X5A Adverse effect of glucocorticoids and synthetic analogues, initial encounter: Secondary | ICD-10-CM | POA: Diagnosis not present

## 2016-03-14 DIAGNOSIS — C9101 Acute lymphoblastic leukemia, in remission: Secondary | ICD-10-CM | POA: Diagnosis not present

## 2016-03-14 DIAGNOSIS — D89811 Chronic graft-versus-host disease: Secondary | ICD-10-CM | POA: Diagnosis not present

## 2016-03-14 DIAGNOSIS — R05 Cough: Secondary | ICD-10-CM | POA: Diagnosis not present

## 2016-03-14 DIAGNOSIS — R739 Hyperglycemia, unspecified: Secondary | ICD-10-CM | POA: Diagnosis not present

## 2016-03-14 DIAGNOSIS — D801 Nonfamilial hypogammaglobulinemia: Secondary | ICD-10-CM | POA: Diagnosis not present

## 2016-03-14 DIAGNOSIS — D89813 Graft-versus-host disease, unspecified: Secondary | ICD-10-CM | POA: Diagnosis not present

## 2016-03-14 DIAGNOSIS — Z9484 Stem cells transplant status: Secondary | ICD-10-CM | POA: Diagnosis not present

## 2016-04-04 ENCOUNTER — Other Ambulatory Visit: Payer: Self-pay | Admitting: Family Medicine

## 2016-04-04 DIAGNOSIS — Z1231 Encounter for screening mammogram for malignant neoplasm of breast: Secondary | ICD-10-CM

## 2016-04-05 DIAGNOSIS — D89811 Chronic graft-versus-host disease: Secondary | ICD-10-CM | POA: Diagnosis not present

## 2016-04-05 DIAGNOSIS — H532 Diplopia: Secondary | ICD-10-CM | POA: Diagnosis not present

## 2016-04-05 DIAGNOSIS — H04123 Dry eye syndrome of bilateral lacrimal glands: Secondary | ICD-10-CM | POA: Diagnosis not present

## 2016-04-05 DIAGNOSIS — E119 Type 2 diabetes mellitus without complications: Secondary | ICD-10-CM | POA: Diagnosis not present

## 2016-04-11 DIAGNOSIS — I10 Essential (primary) hypertension: Secondary | ICD-10-CM | POA: Diagnosis not present

## 2016-04-11 DIAGNOSIS — E119 Type 2 diabetes mellitus without complications: Secondary | ICD-10-CM | POA: Diagnosis not present

## 2016-04-11 DIAGNOSIS — R000 Tachycardia, unspecified: Secondary | ICD-10-CM | POA: Diagnosis not present

## 2016-04-11 DIAGNOSIS — Z86711 Personal history of pulmonary embolism: Secondary | ICD-10-CM | POA: Diagnosis not present

## 2016-04-11 DIAGNOSIS — J42 Unspecified chronic bronchitis: Secondary | ICD-10-CM | POA: Diagnosis not present

## 2016-04-11 DIAGNOSIS — J84116 Cryptogenic organizing pneumonia: Secondary | ICD-10-CM | POA: Diagnosis not present

## 2016-04-11 DIAGNOSIS — R600 Localized edema: Secondary | ICD-10-CM | POA: Diagnosis not present

## 2016-04-11 DIAGNOSIS — Z794 Long term (current) use of insulin: Secondary | ICD-10-CM | POA: Diagnosis not present

## 2016-04-11 DIAGNOSIS — Z79899 Other long term (current) drug therapy: Secondary | ICD-10-CM | POA: Diagnosis not present

## 2016-04-12 DIAGNOSIS — I10 Essential (primary) hypertension: Secondary | ICD-10-CM | POA: Diagnosis not present

## 2016-04-12 DIAGNOSIS — R51 Headache: Secondary | ICD-10-CM | POA: Diagnosis not present

## 2016-04-12 DIAGNOSIS — R609 Edema, unspecified: Secondary | ICD-10-CM | POA: Diagnosis not present

## 2016-04-24 ENCOUNTER — Encounter (HOSPITAL_COMMUNITY): Payer: Self-pay | Admitting: Emergency Medicine

## 2016-04-24 ENCOUNTER — Emergency Department (HOSPITAL_COMMUNITY)
Admission: EM | Admit: 2016-04-24 | Discharge: 2016-04-24 | Disposition: A | Discharge status: Home / Self Care | Payer: Medicare Other | Attending: Emergency Medicine | Admitting: Emergency Medicine

## 2016-04-24 DIAGNOSIS — Z794 Long term (current) use of insulin: Secondary | ICD-10-CM | POA: Insufficient documentation

## 2016-04-24 DIAGNOSIS — J45909 Unspecified asthma, uncomplicated: Secondary | ICD-10-CM | POA: Insufficient documentation

## 2016-04-24 DIAGNOSIS — N12 Tubulo-interstitial nephritis, not specified as acute or chronic: Secondary | ICD-10-CM | POA: Insufficient documentation

## 2016-04-24 DIAGNOSIS — E119 Type 2 diabetes mellitus without complications: Secondary | ICD-10-CM | POA: Diagnosis not present

## 2016-04-24 DIAGNOSIS — R509 Fever, unspecified: Secondary | ICD-10-CM | POA: Diagnosis present

## 2016-04-24 DIAGNOSIS — I10 Essential (primary) hypertension: Secondary | ICD-10-CM | POA: Diagnosis not present

## 2016-04-24 LAB — COMPREHENSIVE METABOLIC PANEL
ALBUMIN: 3.3 g/dL — AB (ref 3.5–5.0)
ALT: 34 U/L (ref 14–54)
ANION GAP: 10 (ref 5–15)
AST: 49 U/L — ABNORMAL HIGH (ref 15–41)
Alkaline Phosphatase: 119 U/L (ref 38–126)
BUN: 17 mg/dL (ref 6–20)
CALCIUM: 9 mg/dL (ref 8.9–10.3)
CHLORIDE: 96 mmol/L — AB (ref 101–111)
CO2: 29 mmol/L (ref 22–32)
Creatinine, Ser: 1.66 mg/dL — ABNORMAL HIGH (ref 0.44–1.00)
GFR calc non Af Amer: 35 mL/min — ABNORMAL LOW (ref >60)
GFR, EST AFRICAN AMERICAN: 40 mL/min — AB (ref >60)
GLUCOSE: 107 mg/dL — AB (ref 65–99)
POTASSIUM: 3.4 mmol/L — AB (ref 3.5–5.1)
SODIUM: 135 mmol/L (ref 135–145)
Total Bilirubin: 0.4 mg/dL (ref 0.3–1.2)
Total Protein: 7.4 g/dL (ref 6.5–8.1)

## 2016-04-24 LAB — CBC WITH DIFFERENTIAL/PLATELET
BASOS PCT: 0 %
Basophils Absolute: 0 10*3/uL (ref 0.0–0.1)
EOS ABS: 0 10*3/uL (ref 0.0–0.7)
Eosinophils Relative: 0 %
HEMATOCRIT: 36.2 % (ref 36.0–46.0)
Hemoglobin: 12.5 g/dL (ref 12.0–15.0)
LYMPHS PCT: 23 %
Lymphs Abs: 2.1 10*3/uL (ref 0.7–4.0)
MCH: 31.2 pg (ref 26.0–34.0)
MCHC: 34.5 g/dL (ref 30.0–36.0)
MCV: 90.3 fL (ref 78.0–100.0)
Monocytes Absolute: 0.8 10*3/uL (ref 0.1–1.0)
Monocytes Relative: 9 %
NEUTROS ABS: 6.2 10*3/uL (ref 1.7–7.7)
NEUTROS PCT: 68 %
Platelets: 273 10*3/uL (ref 150–400)
RBC: 4.01 MIL/uL (ref 3.87–5.11)
RDW: 15 % (ref 11.5–15.5)
WBC: 9.2 10*3/uL (ref 4.0–10.5)

## 2016-04-24 LAB — URINALYSIS, ROUTINE W REFLEX MICROSCOPIC
Bilirubin Urine: NEGATIVE
Glucose, UA: NEGATIVE mg/dL
Hgb urine dipstick: NEGATIVE
KETONES UR: NEGATIVE mg/dL
Nitrite: NEGATIVE
PROTEIN: 30 mg/dL — AB
Specific Gravity, Urine: 1.023 (ref 1.005–1.030)
pH: 5 (ref 5.0–8.0)

## 2016-04-24 MED ORDER — CIPROFLOXACIN HCL 250 MG PO TABS: 250.0000 mg | ORAL_TABLET | Freq: Two times a day (BID) | ORAL | 0 refills | Status: DC

## 2016-04-24 MED ORDER — CIPROFLOXACIN HCL 500 MG PO TABS
250.0000 mg | ORAL_TABLET | Freq: Once | ORAL | Status: AC
  Administered 2016-04-24: 250 mg via ORAL
  Filled 2016-04-24: qty 1

## 2016-04-24 MED ORDER — DEXTROSE 5 % IV SOLN
2.0000 g | Freq: Once | INTRAVENOUS | Status: AC
  Administered 2016-04-24: 2 g via INTRAVENOUS
  Filled 2016-04-24: qty 2

## 2016-04-24 MED ORDER — CIPROFLOXACIN HCL 500 MG PO TABS: 500.0000 mg | ORAL_TABLET | Freq: Two times a day (BID) | ORAL | 0 refills | Status: DC

## 2016-04-24 MED ORDER — ONDANSETRON HCL 4 MG/2ML IJ SOLN
4.0000 mg | Freq: Once | INTRAMUSCULAR | Status: AC
  Administered 2016-04-24: 4 mg via INTRAVENOUS
  Filled 2016-04-24: qty 2

## 2016-04-24 NOTE — ED Provider Notes (Addendum)
[facility] DEPT Provider Note   CSN: [id number] Arrival date & time: 04/24/16  1631     History   Chief Complaint Chief Complaint  Patient presents with  . Fever  . Flank Pain  . Cancer Pt    HPI Lynn [name] is a 51 y.o. female. CC:  Fever, and flank pain.  HPI:  This is a 51 year old female with a history of CLL. Currently in remission. Status post bone marrow transplant, on multiple antidepressants. Describes symptoms starting 10 days ago. Had fevers and chills over the weekend. These resolved. Monday through Friday of last week she was fine. On Saturday, 2 days ago she developed chills. She's had pain in her left flank. Had a headache and body aches today and presents here.  No shortness of breath or cough. No URI symptoms. No GI complaints with nausea vomiting or diarrhea. No skin rash. She has a port. Has not been accessed recently.  Past Medical History:  Diagnosis Date  . ALL (acute lymphoblastic leukemia) ([place])   . Anemia   . Arthritis   . Brain bleed ([place])    07/04/11  . Diabetes mellitus without complication ([place])   . Headache(784.0)   . Hypertension   . Leukemia ([place])   . Pneumonia   . Pulmonary embolism ([place])    06/22/11  . Thrush of mouth and esophagus ([place]) 04/09/2014    Patient Active Problem List   Diagnosis Date Noted  . Headache 11/16/2015  . Blurry vision, bilateral 10/07/2015  . Chronic headaches 05/06/2015  . Dehydration 05/21/2014  . Prerenal renal failure 05/21/2014  . Thrush of mouth and esophagus ([place]) 04/09/2014  . Chronic graft-versus-host disease ([place]) 04/09/2014  . Leukopenia 01/08/2014  . S/P allogeneic bone marrow transplant ([place]) 01/08/2014  . Asthma, chronic 12/13/2013  . Pulmonary infiltrates 12/13/2013  . Cryptogenic organizing pneumonia 08/27/2013  . BOOP (bronchiolitis obliterans with organizing pneumonia) ([place]) 08/08/2013  . Insulin dependent type 2 diabetes mellitus, uncontrolled ([place]) 07/30/2013  . HCAP  (healthcare-associated pneumonia) 06/10/2013  . History of pulmonary embolism 01/11/2013  . Greenfield filter in place 01/11/2013  . Port-a-cath in place 01/09/2013  . Right thyroid nodule 07/09/2012  . Fever 05/13/2012  . History of peripheral stem cell transplant ([place]) 01/12/2012  . Transfusion reaction 11/27/2011  . Acute lymphoblastic leukemia in remission ([place]) 08/21/2011  . Subdural hemorrhage ([place]) 07/26/2011  . Essential hypertension 07/06/2011  . Thrombocytopenia ([place]) 06/17/2011  . Anemia in neoplastic disease 06/17/2011  . [place]ilar lymphadenopathy 06/17/2011    Past Surgical History:  Procedure Laterality Date  . BONE MARROW TRANSPLANT    . CESAREAN SECTION  2000  . CRANIOTOMY  07/04/11  . EYE SURGERY Right 2005   repair crossed eye  . FOOT SURGERY    . greenfield filter    . LUNG BIOPSY  06/19/13    OB History    Gravida Para Term Preterm AB Living   2 2 2     2    SAB TAB Ectopic Multiple Live Births                   Home Medications    Prior to Admission medications   Medication Sig Start Date End Date Taking? Authorizing Provider  acyclovir (ZOVIRAX) 800 MG tablet Take 1 tablet (800 mg total) by mouth 2 (two) times daily. . 08/27/13  Yes Concha Norway, MD  ADVAIR DISKUS 250-50 MCG/DOSE AEPB INHALE 1 PUFF INTO THE LUNGS EVERY 12 HOURS 01/18/15  Yes Historical  Provider, MD  amitriptyline (ELAVIL) 10 MG tablet Take 2 tablets (20 mg total) by mouth at bedtime. 11/16/15  Yes Dennie Bible, NP  fluconazole (DIFLUCAN) 200 MG tablet Take 200 mg by mouth 2 (two) times daily.  05/03/15  Yes Historical Provider, MD  Fluticasone-Salmeterol (ADVAIR) 250-50 MCG/DOSE AEPB INHALE 1 PUFF INTO THE LUNGS EVERY 12 HOURS 01/18/15  Yes Historical Provider, MD  gabapentin (NEURONTIN) 300 MG capsule TAKE ONE CAPSULE BY MOUTH THREE TIMES DAILY Patient taking differently: TAKE ONE CAPSULE BY MOUTH QHS 02/02/16  Yes Dennie Bible, NP  hydrochlorothiazide (MICROZIDE) 12.5 MG  capsule Take 12.5 mg by mouth daily.   Yes Historical Provider, MD  IMBRUVICA 140 MG capsul Take 280 mg by mouth daily. Take 2 tablets once daily 09/28/15  Yes Historical Provider, MD  Magnesium 250 MG TABS Take 250 mg by mouth daily.   Yes Historical Provider, MD  Multiple Vitamin (MULTIVITAMIN) tablet Take 1 tablet by mouth daily.   Yes Historical Provider, MD  mycophenolate (CELLCEPT) 250 MG capsule Take 250 mg by mouth 2 (two) times daily. Patient takes a total of 500 mg daily 05/18/15 05/17/16 Yes Historical Provider, MD  NOVOLOG FLEXPEN 100 UNIT/ML FlexPen INJECT 16 UNITS WITH EACH MEAL PLUS 18-20 EXTRA UNITS IF SUGAR IS OVER 250 Patient taking differently: INJECT 16 UNITS WITH EACH MEAL PLUS 18-20 EXTRA UNITS IF SUGAR IS OVER 260 09/06/15  Yes Elayne Snare, MD  ondansetron (ZOFRAN ODT) 4 MG disintegrating tablet Take 1 tablet (4 mg total) by mouth every 8 (eight) hours as needed for nausea. 09/24/13  Yes Concha Norway, MD  potassium chloride SA (K-DUR,KLOR-CON) 20 MEQ tablet TAKE 1 TABLET (20 MEQ TOTAL) BY MOUTH DAILY. 10/05/15  Yes Elayne Snare, MD  pravastatin (PRAVACHOL) 20 MG tablet Take 1 tablet (20 mg total) by mouth daily. 10/01/15  Yes Elayne Snare, MD  predniSONE (DELTASONE) 5 MG tablet Take 7.5 mg by mouth daily with breakfast.   Yes Historical Provider, MD  Sirolimus (RAPAMUNE) 0.5 MG TABS Take 0.5 mg by mouth daily.   Yes Historical Provider, MD  SitaGLIPtin-MetFORMIN HCl (JANUMET XR) 50-1000 MG TB24 Take 2 tablets by mouth daily. Patient taking differently: Take 2 tablets by mouth every evening.  10/01/15  Yes Elayne Snare, MD  sulfamethoxazole-trimethoprim (BACTRIM DS,SEPTRA DS) 800-160 MG per tablet Take 1 tablet by mouth 3 (three) times a week. Monday,Wednesday, and Friday 06/19/14  Yes Heath Lark, MD  ACCU-CHEK AVIVA PLUS test strip USE TO TEST BLOOD GLUCOSE 2 TIMES DAILY 10/23/14   Historical Provider, MD  cetirizine (ZYRTEC) 10 MG tablet Take 1 tablet (10 mg total) by mouth daily. Patient taking  differently: Take 10 mg by mouth daily as needed for allergies.  03/07/14   Melony Overly, MD  ciprofloxacin (CIPRO) 250 MG tablet Take 1 tablet (250 mg total) by mouth 2 (two) times daily. 04/24/16   Tanna Furry, MD  ciprofloxacin (CIPRO) 500 MG tablet Take 1 tablet (500 mg total) by mouth 2 (two) times daily. 04/24/16   Tanna Furry, MD  Elastic Bandages & Supports (MEDICAL COMPRESSION SOCKS) MISC Please dispense one pair. 05/13/15   Etta Quill, NP  Insulin Glargine (LANTUS) 100 UNIT/ML Solostar Pen Inject 12 units everymore, can use sliding scale 11/16/14   Elayne Snare, MD  LORazepam (ATIVAN) 1 MG tablet Take 1 mg by mouth every 4 (four) hours as needed for anxiety.  02/06/14   Historical Provider, MD  NOVOFINE PLUS 32G X 4 MM MISC  05/31/14  Historical Provider, MD  prochlorperazine (COMPAZINE) 5 MG tablet Take 5 mg by mouth every 6 (six) hours as needed for nausea or vomiting.  10/12/14   Historical Provider, MD  SUMAtriptan (IMITREX) 20 MG/ACT nasal spray Place 1 spray (20 mg total) into the nose once. May repeat in 2 hours if headache persists or recurs. 11/16/15 11/16/15  Dennie Bible, NP  traMADol (ULTRAM) 50 MG tablet Take 1 tablet (50 mg total) by mouth every 6 (six) hours as needed for moderate pain. Patient not taking: Reported on 04/24/2016 09/24/13   Concha Norway, MD    Family History Family History  Problem Relation Age of Onset  . Pancreatic cancer Father   . Cancer Mother     colon  . Pancreatic cancer Mother   . Asthma Son   . Hypertension Sister   . Diabetes Neg Hx   . Heart disease Neg Hx     Social History Social History  Substance Use Topics  . Smoking status: Never Smoker  . Smokeless tobacco: Never Used  . Alcohol use No     Allergies   Patient has no known allergies.   Review of Systems Review of Systems  Constitutional: Positive for chills and fever. Negative for appetite change, diaphoresis and fatigue.  HENT: Negative for mouth sores, sore throat and  trouble swallowing.   Eyes: Negative for visual disturbance.  Respiratory: Negative for cough, chest tightness, shortness of breath and wheezing.   Cardiovascular: Negative for chest pain.  Gastrointestinal: Positive for nausea. Negative for abdominal distention, abdominal pain, diarrhea and vomiting.  Endocrine: Negative for polydipsia, polyphagia and polyuria.  Genitourinary: Positive for flank pain. Negative for dysuria, frequency and hematuria.  Musculoskeletal: Negative for gait problem.  Skin: Negative for color change, pallor and rash.  Neurological: Positive for headaches. Negative for dizziness, syncope and light-headedness.  Hematological: Does not bruise/bleed easily.  Psychiatric/Behavioral: Negative for behavioral problems and confusion.     Physical Exam Updated Vital Signs BP 127/85 (BP Location: Left Arm)   Pulse (!) 106   Temp 99.3 F (37.4 C) (Oral)   Resp 16   Ht 5\' 6"  (1.676 m)   Wt 224 lb (101.6 kg)   LMP 08/09/2011   SpO2 100%   BMI 36.15 kg/m   Physical Exam  Constitutional: She is oriented to person, place, and time. She appears well-developed and well-nourished. No distress.  Awake alert. Mentating well. Does not appear toxic.  HENT:  Head: Normocephalic.  Eyes: Conjunctivae are normal. Pupils are equal, round, and reactive to light. No scleral icterus.  Neck: Normal range of motion. Neck supple. No thyromegaly present.  Cardiovascular: Normal rate and regular rhythm.  Exam reveals no gallop and no friction rub.   No murmur heard. Pulmonary/Chest: Effort normal and breath sounds normal. No respiratory distress. She has no wheezes. She has no rales.  Bilateral breath sounds. No increased worker breathing. Normal saturations.  Abdominal: Soft. Bowel sounds are normal. She exhibits no distension. There is no tenderness. There is no rebound.  Musculoskeletal: Normal range of motion.  Neurological: She is alert and oriented to person, place, and time.    Skin: Skin is warm and dry. No rash noted.  Psychiatric: She has a normal mood and affect. Her behavior is normal.     ED Treatments / Results  Labs (all labs ordered are listed, but only abnormal results are displayed) Labs Reviewed  URINALYSIS, ROUTINE W REFLEX MICROSCOPIC - Abnormal; Notable for the following:  Result Value   APPearance HAZY (*)    Protein, ur 30 (*)    Leukocytes, UA LARGE (*)    Bacteria, UA RARE (*)    Squamous Epithelial / LPF 0-5 (*)    Non Squamous Epithelial 0-5 (*)    All other components within normal limits  COMPREHENSIVE METABOLIC PANEL - Abnormal; Notable for the following:    Potassium 3.4 (*)    Chloride 96 (*)    Glucose, Bld 107 (*)    Creatinine, Ser 1.66 (*)    Albumin 3.3 (*)    AST 49 (*)    GFR calc non Af Amer 35 (*)    GFR calc Af Amer 40 (*)    All other components within normal limits  URINE CULTURE  CULTURE, BLOOD (SINGLE)  CBC WITH DIFFERENTIAL/PLATELET    EKG  EKG Interpretation None       Radiology No results found.  Procedures Procedures (including critical care time)  Medications Ordered in ED Medications  ciprofloxacin (CIPRO) tablet 250 mg (not administered)  cefTRIAXone (ROCEPHIN) 2 g in dextrose 5 % 50 mL IVPB (0 g Intravenous Stopped 04/24/16 2000)  ondansetron (ZOFRAN) injection 4 mg (4 mg Intravenous Given 04/24/16 1919)     Initial Impression / Assessment and Plan / ED Course  I have reviewed the triage vital signs and the nursing notes.  Pertinent labs & imaging results that were available during my care of the patient were reviewed by me and considered in my medical decision making (see chart for details).  Blood counts including neutrophils adequate. Not septic. Not acidotic. Heart rate improves. Blood pressure stable. Given IV antibiotics. Tolerated by mouth. Appropriate for outpatient treatment. I discussed dosing of her Cipro and it's compatibility with Imbruvica with pharmacy. We will  treat with low dose twice a day await urine culture.    Final Clinical Impressions(s) / ED Diagnoses   Final diagnoses:  Pyelonephritis    New Prescriptions New Prescriptions   CIPROFLOXACIN (CIPRO) 250 MG TABLET    Take 1 tablet (250 mg total) by mouth 2 (two) times daily.   CIPROFLOXACIN (CIPRO) 500 MG TABLET    Take 1 tablet (500 mg total) by mouth 2 (two) times daily.     Tanna Furry, MD 04/24/16 2118    Tanna Furry, MD 04/24/16 [phone]

## 2016-04-24 NOTE — ED Triage Notes (Signed)
Patient is complaining of fever, left flank pain, and headache starting over the weekend. Last has tylenol 3pm today. Patient is currently on immunosuppressants

## 2016-04-24 NOTE — Discharge Instructions (Signed)
Continue current medicines at their current dosages.  Follow-up with your physician within 24-48 hours if not improving.  If your culture shows an organism resistant to your prescribed antibiotic, you will be contacted and instructed on further care.

## 2016-04-25 DIAGNOSIS — R05 Cough: Secondary | ICD-10-CM | POA: Diagnosis not present

## 2016-04-25 DIAGNOSIS — D89811 Chronic graft-versus-host disease: Secondary | ICD-10-CM | POA: Diagnosis not present

## 2016-04-25 DIAGNOSIS — R739 Hyperglycemia, unspecified: Secondary | ICD-10-CM | POA: Diagnosis not present

## 2016-04-25 DIAGNOSIS — R509 Fever, unspecified: Secondary | ICD-10-CM | POA: Diagnosis not present

## 2016-04-25 DIAGNOSIS — Z9484 Stem cells transplant status: Secondary | ICD-10-CM | POA: Diagnosis not present

## 2016-04-25 DIAGNOSIS — J42 Unspecified chronic bronchitis: Secondary | ICD-10-CM | POA: Diagnosis not present

## 2016-04-25 DIAGNOSIS — D801 Nonfamilial hypogammaglobulinemia: Secondary | ICD-10-CM | POA: Diagnosis not present

## 2016-04-25 DIAGNOSIS — C9101 Acute lymphoblastic leukemia, in remission: Secondary | ICD-10-CM | POA: Diagnosis not present

## 2016-04-25 DIAGNOSIS — N12 Tubulo-interstitial nephritis, not specified as acute or chronic: Secondary | ICD-10-CM | POA: Diagnosis not present

## 2016-04-26 LAB — URINE CULTURE

## 2016-04-28 ENCOUNTER — Other Ambulatory Visit: Payer: Self-pay | Admitting: Endocrinology

## 2016-04-28 ENCOUNTER — Encounter (HOSPITAL_COMMUNITY): Payer: Self-pay | Admitting: Emergency Medicine

## 2016-04-28 ENCOUNTER — Emergency Department (HOSPITAL_COMMUNITY)
Admission: EM | Admit: 2016-04-28 | Discharge: 2016-04-29 | Disposition: A | Discharge status: Home / Self Care | Payer: Medicare Other | Attending: Emergency Medicine | Admitting: Emergency Medicine

## 2016-04-28 DIAGNOSIS — N12 Tubulo-interstitial nephritis, not specified as acute or chronic: Secondary | ICD-10-CM | POA: Diagnosis not present

## 2016-04-28 DIAGNOSIS — E119 Type 2 diabetes mellitus without complications: Secondary | ICD-10-CM | POA: Diagnosis not present

## 2016-04-28 DIAGNOSIS — I10 Essential (primary) hypertension: Secondary | ICD-10-CM | POA: Insufficient documentation

## 2016-04-28 DIAGNOSIS — Z856 Personal history of leukemia: Secondary | ICD-10-CM | POA: Diagnosis not present

## 2016-04-28 DIAGNOSIS — Z794 Long term (current) use of insulin: Secondary | ICD-10-CM | POA: Insufficient documentation

## 2016-04-28 DIAGNOSIS — R509 Fever, unspecified: Secondary | ICD-10-CM | POA: Diagnosis present

## 2016-04-28 LAB — CBC WITH DIFFERENTIAL/PLATELET
Basophils Absolute: 0 10*3/uL (ref 0.0–0.1)
Basophils Relative: 0 %
Eosinophils Absolute: 0 10*3/uL (ref 0.0–0.7)
Eosinophils Relative: 0 %
HCT: 35.6 % — ABNORMAL LOW (ref 36.0–46.0)
HEMOGLOBIN: 12 g/dL (ref 12.0–15.0)
LYMPHS PCT: 34 %
Lymphs Abs: 3.1 10*3/uL (ref 0.7–4.0)
MCH: 30.3 pg (ref 26.0–34.0)
MCHC: 33.7 g/dL (ref 30.0–36.0)
MCV: 89.9 fL (ref 78.0–100.0)
MONO ABS: 1 10*3/uL (ref 0.1–1.0)
Monocytes Relative: 11 %
NEUTROS ABS: 5 10*3/uL (ref 1.7–7.7)
Neutrophils Relative %: 55 %
PLATELETS: 315 10*3/uL (ref 150–400)
RBC: 3.96 MIL/uL (ref 3.87–5.11)
RDW: 14.5 % (ref 11.5–15.5)
WBC: 9.1 10*3/uL (ref 4.0–10.5)

## 2016-04-28 LAB — URINALYSIS, ROUTINE W REFLEX MICROSCOPIC
BILIRUBIN URINE: NEGATIVE
Glucose, UA: NEGATIVE mg/dL
Hgb urine dipstick: NEGATIVE
Ketones, ur: NEGATIVE mg/dL
Nitrite: NEGATIVE
Protein, ur: NEGATIVE mg/dL
Specific Gravity, Urine: 1.019 (ref 1.005–1.030)
pH: 5 (ref 5.0–8.0)

## 2016-04-28 LAB — COMPREHENSIVE METABOLIC PANEL
ALK PHOS: 111 U/L (ref 38–126)
ALT: 20 U/L (ref 14–54)
ANION GAP: 9 (ref 5–15)
AST: 27 U/L (ref 15–41)
Albumin: 3.2 g/dL — ABNORMAL LOW (ref 3.5–5.0)
BUN: 13 mg/dL (ref 6–20)
CALCIUM: 9 mg/dL (ref 8.9–10.3)
CO2: 28 mmol/L (ref 22–32)
CREATININE: 1.55 mg/dL — AB (ref 0.44–1.00)
Chloride: 101 mmol/L (ref 101–111)
GFR calc non Af Amer: 38 mL/min — ABNORMAL LOW (ref >60)
GFR, EST AFRICAN AMERICAN: 44 mL/min — AB (ref >60)
GLUCOSE: 97 mg/dL (ref 65–99)
Potassium: 2.8 mmol/L — ABNORMAL LOW (ref 3.5–5.1)
Sodium: 138 mmol/L (ref 135–145)
TOTAL PROTEIN: 8.1 g/dL (ref 6.5–8.1)
Total Bilirubin: 0.3 mg/dL (ref 0.3–1.2)

## 2016-04-28 LAB — C DIFFICILE QUICK SCREEN W PCR REFLEX
C Diff antigen: NEGATIVE
C Diff interpretation: NOT DETECTED
C Diff toxin: NEGATIVE

## 2016-04-28 LAB — I-STAT CG4 LACTIC ACID, ED: Lactic Acid, Venous: 1.59 mmol/L (ref 0.5–1.9)

## 2016-04-28 LAB — MAGNESIUM: Magnesium: 1.7 mg/dL (ref 1.7–2.4)

## 2016-04-28 MED ORDER — LACTATED RINGERS IV BOLUS (SEPSIS)
1000.0000 mL | Freq: Once | INTRAVENOUS | Status: AC
  Administered 2016-04-28: 1000 mL via INTRAVENOUS

## 2016-04-28 MED ORDER — POTASSIUM CHLORIDE CRYS ER 20 MEQ PO TBCR
40.0000 meq | EXTENDED_RELEASE_TABLET | Freq: Once | ORAL | Status: AC
  Administered 2016-04-28: 40 meq via ORAL
  Filled 2016-04-28: qty 2

## 2016-04-28 MED ORDER — DEXTROSE 5 % IV SOLN
1.0000 g | Freq: Once | INTRAVENOUS | Status: AC
  Administered 2016-04-28: 1 g via INTRAVENOUS
  Filled 2016-04-28: qty 10

## 2016-04-28 NOTE — ED Triage Notes (Signed)
Pt diagnosed with pyelonephritis on Monday; pt complaint of new onset diarrhea and fever onset last night; pt last Tylenol taken at 1500 today; afebrile at present time.

## 2016-04-28 NOTE — ED Provider Notes (Signed)
[facility] DEPT Provider Note   CSN: [id number] Arrival date & time: 04/28/16  1732     History   Chief Complaint Chief Complaint  Patient presents with  . Fever  . Diarrhea    HPI Lynn [name] is a 51 y.o. female.  HPI PT comes in with cc of fevers, diarrhea. Pt has hx of leukemia, in remission. PT gets IVIG at [facility] to boost immune system. Pt also has hx of DM. She reports that on 3/26 she was diagnosed with pyelonephritis and started on cipro 250 mg (increased to 500 mg by [facility]). Titus Dubin pt started having diarrhea, and she has had about 9 episodes in total. BM are loose and watery. PT also had low grade temp of 100.4.  PT has hx of Cdiff infection about a year ago.   PMHx of PE, DM.  Past Medical History:  Diagnosis Date  . ALL (acute lymphoblastic leukemia) ([place])   . Anemia   . Arthritis   . Brain bleed ([place])    07/04/11  . Diabetes mellitus without complication ([place])   . Headache(784.0)   . Hypertension   . Leukemia ([place])   . Pneumonia   . Pulmonary embolism ([place])    06/22/11  . Thrush of mouth and esophagus ([place]) 04/09/2014    Patient Active Problem List   Diagnosis Date Noted  . Headache 11/16/2015  . Blurry vision, bilateral 10/07/2015  . Chronic headaches 05/06/2015  . Dehydration 05/21/2014  . Prerenal renal failure 05/21/2014  . Thrush of mouth and esophagus ([place]) 04/09/2014  . Chronic graft-versus-host disease ([place]) 04/09/2014  . Leukopenia 01/08/2014  . S/P allogeneic bone marrow transplant ([place]) 01/08/2014  . Asthma, chronic 12/13/2013  . Pulmonary infiltrates 12/13/2013  . Cryptogenic organizing pneumonia 08/27/2013  . BOOP (bronchiolitis obliterans with organizing pneumonia) ([place]) 08/08/2013  . Insulin dependent type 2 diabetes mellitus, uncontrolled ([place]) 07/30/2013  . HCAP (healthcare-associated pneumonia) 06/10/2013  . History of pulmonary embolism 01/11/2013  . Greenfield filter in place 01/11/2013  . Port-a-cath in place 01/09/2013    . Right thyroid nodule 07/09/2012  . Fever 05/13/2012  . History of peripheral stem cell transplant ([place]) 01/12/2012  . Transfusion reaction 11/27/2011  . Acute lymphoblastic leukemia in remission ([place]) 08/21/2011  . Subdural hemorrhage ([place]) 07/26/2011  . Essential hypertension 07/06/2011  . Thrombocytopenia ([place]) 06/17/2011  . Anemia in neoplastic disease 06/17/2011  . Hilar lymphadenopathy 06/17/2011    Past Surgical History:  Procedure Laterality Date  . BONE MARROW TRANSPLANT    . CESAREAN SECTION  2000  . CRANIOTOMY  07/04/11  . EYE SURGERY Right 2005   repair crossed eye  . FOOT SURGERY    . greenfield filter    . LUNG BIOPSY  06/19/13    OB History    Gravida Para Term Preterm AB Living   2 2 2     2    SAB TAB Ectopic Multiple Live Births                   Home Medications    Prior to Admission medications   Medication Sig Start Date End Date Taking? Authorizing Provider  acyclovir (ZOVIRAX) 800 MG tablet Take 1 tablet (800 mg total) by mouth 2 (two) times daily. . 08/27/13  Yes Concha Norway, MD  amitriptyline (ELAVIL) 10 MG tablet Take 2 tablets (20 mg total) by mouth at bedtime. 11/16/15  Yes Dennie Bible, NP  cetirizine (ZYRTEC) 10 MG tablet Take 1 tablet (10 mg  total) by mouth daily. Patient taking differently: Take 10 mg by mouth daily as needed for allergies.  03/07/14  Yes Melony Overly, MD  ciprofloxacin (CIPRO) 500 MG tablet Take 1 tablet (500 mg total) by mouth 2 (two) times daily. 04/24/16  Yes Tanna Furry, MD  fluconazole (DIFLUCAN) 200 MG tablet Take 200 mg by mouth 2 (two) times daily.  05/03/15  Yes Historical Provider, MD  Fluticasone-Salmeterol (ADVAIR) 250-50 MCG/DOSE AEPB INHALE 1 PUFF INTO THE LUNGS EVERY 12 HOURS 01/18/15  Yes Historical Provider, MD  gabapentin (NEURONTIN) 300 MG capsule TAKE ONE CAPSULE BY MOUTH THREE TIMES DAILY Patient taking differently: TAKE ONE CAPSULE BY MOUTH QHS 02/02/16  Yes Dennie Bible, NP   hydrochlorothiazide (MICROZIDE) 12.5 MG capsule Take 12.5 mg by mouth daily.   Yes Historical Provider, MD  IMBRUVICA 140 MG capsul Take 280 mg by mouth daily. Take 2 tablets once daily 09/28/15  Yes Historical Provider, MD  LORazepam (ATIVAN) 1 MG tablet Take 1 mg by mouth every 4 (four) hours as needed for anxiety.  02/06/14  Yes Historical Provider, MD  Magnesium 250 MG TABS Take 250 mg by mouth daily.   Yes Historical Provider, MD  Multiple Vitamin (MULTIVITAMIN) tablet Take 1 tablet by mouth daily.   Yes Historical Provider, MD  mycophenolate (CELLCEPT) 250 MG capsule Take 250 mg by mouth 2 (two) times daily. Patient takes a total of 500 mg daily 05/18/15 05/17/16 Yes Historical Provider, MD  NOVOLOG FLEXPEN 100 UNIT/ML FlexPen INJECT 16 UNITS WITH EACH MEAL PLUS 18-20 EXTRA UNITS IF SUGAR IS OVER 250 Patient taking differently: INJECT 16 UNITS WITH EACH MEAL PLUS 18-20 EXTRA UNITS IF SUGAR IS OVER 260 09/06/15  Yes Elayne Snare, MD  potassium chloride SA (K-DUR,KLOR-CON) 20 MEQ tablet TAKE 1 TABLET (20 MEQ TOTAL) BY MOUTH DAILY. 10/05/15  Yes Elayne Snare, MD  pravastatin (PRAVACHOL) 20 MG tablet Take 1 tablet (20 mg total) by mouth daily. 10/01/15  Yes Elayne Snare, MD  predniSONE (DELTASONE) 5 MG tablet Take 7.5 mg by mouth daily with breakfast.   Yes Historical Provider, MD  Sirolimus (RAPAMUNE) 0.5 MG TABS Take 0.5 mg by mouth daily.   Yes Historical Provider, MD  SitaGLIPtin-MetFORMIN HCl (JANUMET XR) 50-1000 MG TB24 Take 2 tablets by mouth daily. 10/01/15  Yes Elayne Snare, MD  sulfamethoxazole-trimethoprim (BACTRIM DS,SEPTRA DS) 800-160 MG per tablet Take 1 tablet by mouth 3 (three) times a week. Monday,Wednesday, and Friday 06/19/14  Yes Heath Lark, MD  SUMAtriptan (IMITREX) 20 MG/ACT nasal spray Place 1 spray (20 mg total) into the nose once. May repeat in 2 hours if headache persists or recurs. 11/16/15 04/28/16 Yes Dennie Bible, NP  ACCU-CHEK AVIVA PLUS test strip USE TO TEST BLOOD GLUCOSE 2 TIMES  DAILY 10/23/14   Historical Provider, MD  cephALEXin (KEFLEX) 500 MG capsule Take 1 capsule (500 mg total) by mouth 2 (two) times daily. 04/29/16   Varney Biles, MD  ciprofloxacin (CIPRO) 250 MG tablet Take 1 tablet (250 mg total) by mouth 2 (two) times daily. 04/24/16   Tanna Furry, MD  Elastic Bandages & Supports (MEDICAL COMPRESSION SOCKS) MISC Please dispense one pair. 05/13/15   Etta Quill, NP  Insulin Glargine (LANTUS) 100 UNIT/ML Solostar Pen Inject 12 units everymore, can use sliding scale 11/16/14   Elayne Snare, MD  NOVOFINE PLUS 32G X 4 MM MISC  05/31/14   Historical Provider, MD  ondansetron (ZOFRAN ODT) 4 MG disintegrating tablet Take 1 tablet (4 mg total)  by mouth every 8 (eight) hours as needed for nausea or vomiting. 04/29/16   Varney Biles, MD    Family History Family History  Problem Relation Age of Onset  . Pancreatic cancer Father   . Cancer Mother     colon  . Pancreatic cancer Mother   . Asthma Son   . Hypertension Sister   . Diabetes Neg Hx   . Heart disease Neg Hx     Social History Social History  Substance Use Topics  . Smoking status: Never Smoker  . Smokeless tobacco: Never Used  . Alcohol use No     Allergies   Patient has no known allergies.   Review of Systems Review of Systems  ROS 10 Systems reviewed and are negative for acute change except as noted in the HPI.     Physical Exam Updated Vital Signs BP 121/78   Pulse 89   Temp 98.4 F (36.9 C) (Oral)   Resp 18   Ht 5\' 6"  (1.676 m)   Wt 224 lb (101.6 kg)   LMP 08/09/2011   SpO2 100%   BMI 36.15 kg/m   Physical Exam  Constitutional: She is oriented to person, place, and time. She appears well-developed.  HENT:  Head: Normocephalic and atraumatic.  Eyes: Conjunctivae and EOM are normal. Pupils are equal, round, and reactive to light.  Neck: Normal range of motion. Neck supple.  Cardiovascular: Normal rate, regular rhythm and normal heart sounds.   Pulmonary/Chest: Effort normal  and breath sounds normal. No respiratory distress.  Abdominal: Soft. Bowel sounds are normal. She exhibits no distension. There is no tenderness. There is no rebound and no guarding.  Neurological: She is alert and oriented to person, place, and time.  Skin: Skin is warm and dry.  Nursing note and vitals reviewed.    ED Treatments / Results  Labs (all labs ordered are listed, but only abnormal results are displayed) Labs Reviewed  COMPREHENSIVE METABOLIC PANEL - Abnormal; Notable for the following:       Result Value   Potassium 2.8 (*)    Creatinine, Ser 1.55 (*)    Albumin 3.2 (*)    GFR calc non Af Amer 38 (*)    GFR calc Af Amer 44 (*)    All other components within normal limits  CBC WITH DIFFERENTIAL/PLATELET - Abnormal; Notable for the following:    HCT 35.6 (*)    All other components within normal limits  URINALYSIS, ROUTINE W REFLEX MICROSCOPIC - Abnormal; Notable for the following:    Leukocytes, UA TRACE (*)    Bacteria, UA RARE (*)    Squamous Epithelial / LPF 0-5 (*)    All other components within normal limits  C DIFFICILE QUICK SCREEN W PCR REFLEX  URINE CULTURE  GASTROINTESTINAL PANEL BY PCR, STOOL (REPLACES STOOL CULTURE)  MAGNESIUM  I-STAT CG4 LACTIC ACID, ED    EKG  EKG Interpretation None       Radiology No results found.  Procedures Procedures (including critical care time)  Medications Ordered in ED Medications  lactated ringers bolus 1,000 mL (0 mLs Intravenous Stopped 04/28/16 2327)  potassium chloride SA (K-DUR,KLOR-CON) CR tablet 40 mEq (40 mEq Oral Given 04/28/16 2129)  potassium chloride SA (K-DUR,KLOR-CON) CR tablet 40 mEq (40 mEq Oral Given 04/28/16 2210)  cefTRIAXone (ROCEPHIN) 1 g in dextrose 5 % 50 mL IVPB (0 g Intravenous Stopped 04/29/16 0037)     Initial Impression / Assessment and Plan / ED Course  I  have reviewed the triage vital signs and the nursing notes.  Pertinent labs & imaging results that were available during my  care of the patient were reviewed by me and considered in my medical decision making (see chart for details).     I personally performed the services described in this documentation, which was scribed in my presence. The recorded information has been reviewed and is accurate.  Pt comes in with cc of dysuria, diarrhea. Pt is on cipro for UTI. She has hx of cdiff. Pt has hx of bonemarrow transplant and is immunosuppressed. VSS and WNL. Pt is not toxic.  Diarrhea-  Side effect to cipro vs. cdiff vs. Graft vs host reaction. Cdiff is neg. Called [facility] and spoke with Dr. Norma Fredrickson, who recommends that pt call their service if diarrhea continues > 3 days.  UA is clean - but we will switch patient to keflex. PO challenge passed here.  Strict ER return precautions have been discussed, and patient is agreeing with the plan and is comfortable with the workup done and the recommendations from the ER.    Final Clinical Impressions(s) / ED Diagnoses   Final diagnoses:  Pyelonephritis    New Prescriptions New Prescriptions   CEPHALEXIN (KEFLEX) 500 MG CAPSULE    Take 1 capsule (500 mg total) by mouth 2 (two) times daily.   ONDANSETRON (ZOFRAN ODT) 4 MG DISINTEGRATING TABLET    Take 1 tablet (4 mg total) by mouth every 8 (eight) hours as needed for nausea or vomiting.     Varney Biles, MD 04/29/16 [phone]

## 2016-04-29 LAB — GASTROINTESTINAL PANEL BY PCR, STOOL (REPLACES STOOL CULTURE)
ASTROVIRUS: NOT DETECTED
Adenovirus F40/41: NOT DETECTED
CRYPTOSPORIDIUM: NOT DETECTED
CYCLOSPORA CAYETANENSIS: NOT DETECTED
Campylobacter species: NOT DETECTED
ENTAMOEBA HISTOLYTICA: NOT DETECTED
ENTEROTOXIGENIC E COLI (ETEC): NOT DETECTED
Enteroaggregative E coli (EAEC): NOT DETECTED
Enteropathogenic E coli (EPEC): NOT DETECTED
Giardia lamblia: NOT DETECTED
Norovirus GI/GII: NOT DETECTED
Plesimonas shigelloides: NOT DETECTED
Rotavirus A: NOT DETECTED
SAPOVIRUS (I, II, IV, AND V): NOT DETECTED
SHIGA LIKE TOXIN PRODUCING E COLI (STEC): NOT DETECTED
Salmonella species: NOT DETECTED
Shigella/Enteroinvasive E coli (EIEC): NOT DETECTED
VIBRIO CHOLERAE: NOT DETECTED
VIBRIO SPECIES: NOT DETECTED
YERSINIA ENTEROCOLITICA: NOT DETECTED

## 2016-04-29 LAB — CULTURE, BLOOD (SINGLE): CULTURE: NO GROWTH

## 2016-04-29 MED ORDER — ONDANSETRON 4 MG PO TBDP: 4.0000 mg | ORAL_TABLET | Freq: Three times a day (TID) | ORAL | 0 refills | Status: AC | PRN

## 2016-04-29 MED ORDER — CEPHALEXIN 500 MG PO CAPS: 500.0000 mg | ORAL_CAPSULE | Freq: Two times a day (BID) | ORAL | 0 refills | Status: DC

## 2016-04-29 NOTE — Discharge Instructions (Signed)
Please return to the [facility] if your symptoms worsen; you have increased pain, fevers, chills, inability to keep any medications down, confusion.  Please see [facility] if the diarrhea continues into Sunday- call [facility] and speak with Dr. Norma Fredrickson if diarrhea persists.

## 2016-04-30 LAB — URINE CULTURE: Culture: NO GROWTH

## 2016-05-08 ENCOUNTER — Other Ambulatory Visit (INDEPENDENT_AMBULATORY_CARE_PROVIDER_SITE_OTHER): Payer: Medicare Other

## 2016-05-08 ENCOUNTER — Ambulatory Visit
Admission: RE | Admit: 2016-05-08 | Discharge: 2016-05-08 | Disposition: A | Discharge status: Home / Self Care | Payer: Medicare Other | Source: Ambulatory Visit | Attending: Family Medicine | Admitting: Family Medicine

## 2016-05-08 DIAGNOSIS — Z1231 Encounter for screening mammogram for malignant neoplasm of breast: Secondary | ICD-10-CM

## 2016-05-08 DIAGNOSIS — E1165 Type 2 diabetes mellitus with hyperglycemia: Secondary | ICD-10-CM

## 2016-05-08 LAB — COMPREHENSIVE METABOLIC PANEL
ALBUMIN: 3.4 g/dL — AB (ref 3.5–5.2)
ALK PHOS: 85 U/L (ref 39–117)
ALT: 15 U/L (ref 0–35)
AST: 21 U/L (ref 0–37)
BILIRUBIN TOTAL: 0.2 mg/dL (ref 0.2–1.2)
BUN: 16 mg/dL (ref 6–23)
CALCIUM: 9 mg/dL (ref 8.4–10.5)
CO2: 30 mEq/L (ref 19–32)
CREATININE: 1.5 mg/dL — AB (ref 0.40–1.20)
Chloride: 104 mEq/L (ref 96–112)
GFR: 46.96 mL/min — ABNORMAL LOW (ref >60.00)
Glucose, Bld: 90 mg/dL (ref 70–99)
Potassium: 3.8 mEq/L (ref 3.5–5.1)
SODIUM: 140 meq/L (ref 135–145)
TOTAL PROTEIN: 6.5 g/dL (ref 6.0–8.3)

## 2016-05-08 LAB — MICROALBUMIN / CREATININE URINE RATIO
Creatinine,U: 166.5 mg/dL
MICROALB UR: 1.3 mg/dL (ref 0.0–1.9)
Microalb Creat Ratio: 0.8 mg/g (ref 0.0–30.0)

## 2016-05-08 LAB — HEMOGLOBIN A1C: Hgb A1c MFr Bld: 6.9 % — ABNORMAL HIGH (ref 4.6–6.5)

## 2016-05-11 ENCOUNTER — Ambulatory Visit (INDEPENDENT_AMBULATORY_CARE_PROVIDER_SITE_OTHER): Payer: Medicare Other | Admitting: Endocrinology

## 2016-05-11 ENCOUNTER — Encounter: Payer: Self-pay | Admitting: Endocrinology

## 2016-05-11 VITALS — BP 114/82 | HR 118 | Ht 66.0 in | Wt 217.0 lb

## 2016-05-11 DIAGNOSIS — N183 Chronic kidney disease, stage 3 unspecified: Secondary | ICD-10-CM

## 2016-05-11 DIAGNOSIS — T380X5A Adverse effect of glucocorticoids and synthetic analogues, initial encounter: Secondary | ICD-10-CM

## 2016-05-11 DIAGNOSIS — I10 Essential (primary) hypertension: Secondary | ICD-10-CM | POA: Diagnosis not present

## 2016-05-11 DIAGNOSIS — E2740 Unspecified adrenocortical insufficiency: Secondary | ICD-10-CM | POA: Diagnosis not present

## 2016-05-11 DIAGNOSIS — E1165 Type 2 diabetes mellitus with hyperglycemia: Secondary | ICD-10-CM | POA: Diagnosis not present

## 2016-05-11 NOTE — Progress Notes (Signed)
Patient ID: Lynn [name], female   DOB: [birth year], 51 y.o.   MRN: [id number]    Reason for Appointment: Endocrinology follow-up  Referring physician: Donnie Coffin  History of Present Illness:          DIABETES: Diagnosis: Type 2 diabetes mellitus, date of diagnosis: 2013        Past history: She weighed nearly 300 lb in 2013 around that time her diabetes was diagnosed Although her A1c at that time was reported at 7.4 she does not remember being told about diabetes and no treatment was given In 5/15 when she was getting steroids for her bone marrow transplant her blood sugar was over 400 She was started on insulin and had been taking a basal bolus insulin regimen subsequently while on prednisone Although she required small amounts of insulin she could not taper off insulin even when off prednisone  Recent history:    INSULIN regimen is NovoLog prn  She is now on Prednisone 7.5mg   Currently she is taking 2 tablet of Janumet XR without side effects   A1c is improved at 6.9, previously was higher at 7.3  Current management, blood sugar patterns and problems:  Although she was taking 1 tablet of Janumet XR on her last visit she appears to have increased this on her own to 2 tablets daily  Her blood sugars are being checked very sporadically again  Fasting readings are minimally increased and she has had only one high reading of 192 after lunch today  Out of her 7 readings she has an average of 146 with a range of 117-192, fasting lab glucose was 90  She has recently had fever and possible UTI  Has had intermittent exercise, not able to do this consistently because of various medical problems       Oral hypoglycemic drugs the patient is taking are: Janumet XR once a day    Side effects from medications have been: Diarrhea from Metformin over 1000 mg dose  Glucose monitoring:  done less than 1 time a day         Glucometer:  Accu-Chek   Readings as  above  Glycemic control:   Lab Results  Component Value Date   HGBA1C 6.9 (H) 05/08/2016   HGBA1C 7.3 (H) 02/10/2016   HGBA1C 6.3 11/10/2015   Lab Results  Component Value Date   MICROALBUR 1.3 05/08/2016   LDLCALC 85 12/14/2014   CREATININE 1.50 (H) 05/08/2016    Self-care: The diet that the patient has been following is: tries to limit meal size  Meals: 2 meals per day.  with breakfast at 11 am. Supper 5 pm   Exercise: walking a littleAt times Dietician visit: Most recent: 5/15 at the hospital              Compliance with the medical regimen: Fair  Weight history:  Wt Readings from Last 3 Encounters:  05/11/16 217 lb (98.4 kg)  04/28/16 224 lb (101.6 kg)  04/24/16 224 lb (101.6 kg)    PROBLEM 2:   She is asking with help with steroid taper. She is taking this for GVHD and also for her pulmonary condition  She was told by her oncologist that she should be tapered off her steroid but she thinks she does not feel well when the dose is below 10 mg However she has been on 7.5 mg daily for about a month, previously on 10 mg Only since last Friday she has started feeling fatigued  and somewhat weaker but also has had issues with fever and UTI Prednisone was continued at 7.5 mg in the last few days She does not complain of any nausea, lightheadedness    Lab on 05/08/2016  Component Date Value Ref Range Status  . Hgb A1c MFr Bld 05/08/2016 6.9* 4.6 - 6.5 % Final  . Sodium 05/08/2016 140  135 - 145 mEq/L Final  . Potassium 05/08/2016 3.8  3.5 - 5.1 mEq/L Final  . Chloride 05/08/2016 104  96 - 112 mEq/L Final  . CO2 05/08/2016 30  19 - 32 mEq/L Final  . Glucose, Bld 05/08/2016 90  70 - 99 mg/dL Final  . BUN 05/08/2016 16  6 - 23 mg/dL Final  . Creatinine, Ser 05/08/2016 1.50* 0.40 - 1.20 mg/dL Final  . Total Bilirubin 05/08/2016 0.2  0.2 - 1.2 mg/dL Final  . Alkaline Phosphatase 05/08/2016 85  39 - 117 U/L Final  . AST 05/08/2016 21  0 - 37 U/L Final  . ALT 05/08/2016  15  0 - 35 U/L Final  . Total Protein 05/08/2016 6.5  6.0 - 8.3 g/dL Final  . Albumin 05/08/2016 3.4* 3.5 - 5.2 g/dL Final  . Calcium 05/08/2016 9.0  8.4 - 10.5 mg/dL Final  . GFR 05/08/2016 46.96* >60.00 mL/min Final  . Microalb, Ur 05/08/2016 1.3  0.0 - 1.9 mg/dL Final  . Creatinine,U 05/08/2016 166.5  mg/dL Final  . Microalb Creat Ratio 05/08/2016 0.8  0.0 - 30.0 mg/g Final    Allergies as of 05/11/2016   No Known Allergies     Medication List       Accurate as of 05/11/16 11:59 PM. Always use your most recent med list.          ACCU-CHEK AVIVA PLUS test strip Generic drug:  glucose blood USE TO TEST BLOOD GLUCOSE 2 TIMES DAILY   acyclovir 800 MG tablet Commonly known as:  ZOVIRAX Take 1 tablet (800 mg total) by mouth 2 (two) times daily. Marland Kitchen   amitriptyline 10 MG tablet Commonly known as:  ELAVIL Take 2 tablets (20 mg total) by mouth at bedtime.   CELLCEPT 250 MG capsule Generic drug:  mycophenolate Take 250 mg by mouth 2 (two) times daily. Patient takes a total of 500 mg daily   cephALEXin 500 MG capsule Commonly known as:  KEFLEX Take 1 capsule (500 mg total) by mouth 2 (two) times daily.   cetirizine 10 MG tablet Commonly known as:  ZYRTEC Take 1 tablet (10 mg total) by mouth daily.   ciprofloxacin 500 MG tablet Commonly known as:  CIPRO Take 1 tablet (500 mg total) by mouth 2 (two) times daily.   fluconazole 200 MG tablet Commonly known as:  DIFLUCAN Take 200 mg by mouth 2 (two) times daily.   Fluticasone-Salmeterol 250-50 MCG/DOSE Aepb Commonly known as:  ADVAIR INHALE 1 PUFF INTO THE LUNGS EVERY 12 HOURS   gabapentin 300 MG capsule Commonly known as:  NEURONTIN TAKE ONE CAPSULE BY MOUTH THREE TIMES DAILY   hydrochlorothiazide 12.5 MG capsule Commonly known as:  MICROZIDE Take 12.5 mg by mouth daily.   IMBRUVICA 140 MG tablet Generic drug:  ibrutinib Take 280 mg by mouth daily. Take 2 tablets once daily   Insulin Glargine 100 UNIT/ML Solostar  Pen Commonly known as:  LANTUS Inject 12 units everymore, can use sliding scale   KLOR-CON M20 20 MEQ tablet Generic drug:  potassium chloride SA TAKE 1 TABLET (20 MEQ TOTAL) BY MOUTH DAILY.  LORazepam 1 MG tablet Commonly known as:  ATIVAN Take 1 mg by mouth every 4 (four) hours as needed for anxiety.   Magnesium 250 MG Tabs Take 250 mg by mouth daily.   Medical Compression Socks Misc Please dispense one pair.   multivitamin tablet Take 1 tablet by mouth daily.   NOVOFINE PLUS 32G X 4 MM Misc Generic drug:  Insulin Pen Needle   NOVOLOG FLEXPEN 100 UNIT/ML FlexPen Generic drug:  insulin aspart INJECT 16 UNITS WITH EACH MEAL PLUS 18-20 EXTRA UNITS IF SUGAR IS OVER 250   ondansetron 4 MG disintegrating tablet Commonly known as:  ZOFRAN ODT Take 1 tablet (4 mg total) by mouth every 8 (eight) hours as needed for nausea or vomiting.   pravastatin 20 MG tablet Commonly known as:  PRAVACHOL Take 1 tablet (20 mg total) by mouth daily.   predniSONE 5 MG tablet Commonly known as:  DELTASONE Take 7.5 mg by mouth daily with breakfast.   RAPAMUNE 0.5 MG Tabs Generic drug:  Sirolimus Take 0.5 mg by mouth daily.   SitaGLIPtin-MetFORMIN HCl 50-1000 MG Tb24 Commonly known as:  JANUMET XR Take 2 tablets by mouth daily.   sulfamethoxazole-trimethoprim 800-160 MG tablet Commonly known as:  BACTRIM DS,SEPTRA DS Take 1 tablet by mouth 3 (three) times a week. Monday,Wednesday, and Friday   SUMAtriptan 20 MG/ACT nasal spray Commonly known as:  IMITREX Place 1 spray (20 mg total) into the nose once. May repeat in 2 hours if headache persists or recurs.       Allergies:  No Known Allergies  Past Medical History:  Diagnosis Date  . ALL (acute lymphoblastic leukemia) ([place])   . Anemia   . Arthritis   . Brain bleed ([place])    07/04/11  . Diabetes mellitus without complication ([place])   . Headache(784.0)   . Hypertension   . Leukemia ([place])   . Pneumonia   . Pulmonary embolism  ([place])    06/22/11  . Thrush of mouth and esophagus ([place]) 04/09/2014    Past Surgical History:  Procedure Laterality Date  . BONE MARROW TRANSPLANT    . CESAREAN SECTION  2000  . CRANIOTOMY  07/04/11  . EYE SURGERY Right 2005   repair crossed eye  . FOOT SURGERY    . greenfield filter    . LUNG BIOPSY  06/19/13    Family History  Problem Relation Age of Onset  . Pancreatic cancer Father   . Cancer Mother     colon  . Pancreatic cancer Mother   . Asthma Son   . Hypertension Sister   . Diabetes Neg Hx   . Heart disease Neg Hx   . Breast cancer Neg Hx     Social History:  reports that she has never smoked. She has never used smokeless tobacco. She reports that she does not drink alcohol or use drugs.    Review of Systems   On 7.5 for 1 mth       Lipids: She tends to have high triglycerides and mildly increased LDL which is improved, Last below 100 She was started on pravastatin 20 mg daily in 9/16      Lab Results  Component Value Date   CHOL 250 (H) 02/10/2016   HDL 65.80 02/10/2016   LDLCALC 85 12/14/2014   LDLDIRECT 137.0 02/10/2016   TRIG 220.0 (H) 02/10/2016   CHOLHDL 4 02/10/2016    She is  taking HCTZ for hypertension, this is being prescribed by her oncologist  Has mild Decrease in renal function, somewhat variable based on her hydration status   Lab Results  Component Value Date   CREATININE 1.50 (H) 05/08/2016   BUN 16 05/08/2016   NA 140 05/08/2016   K 3.8 05/08/2016   CL 104 05/08/2016   CO2 30 05/08/2016     She is being followed at [facility] for her various problems including transplant rejection  Her liver functions have been normal Recently     Thyroid:   She had a thyroid biopsy in 6/14 for a left-sided thyroid nodule which was benign      Physical Examination:  BP 114/82   Pulse (!) 118   Ht 5\' 6"  (1.676 m)   Wt 217 lb (98.4 kg)   LMP 08/09/2011   BMI 35.02 kg/m   Diabetic Foot Exam - Simple   No data filed       ASSESSMENT:  Diabetes type 2, uncontrolled  See history of present illness for detailed discussion of  current management, blood sugar patterns and problems identified  Her A1c is somewhat better at 6.9 May have better controlled with being able to take less prednisone now Highest blood glucose 192 at home, may be higher because of her recent infection/fever and this was postprandially Although her renal function is borderline for using full doses of Janumet she is tolerating this well for the last couple of months  STEROID dependency:  She has been on long-term steroids and she thinks she cannot tolerate lower doses below 10 mg She has been tapered  from 10 mg down to 7.5 mg about a month ago Higher subjectively she has been feeling worse only in the last 5-6 days since she has had a UTI and continued recurrent fever No symptoms suggestive of adrenal crisis at this time and she has normal blood pressure and sodium levels  Likely she needs to use stress doses of steroids because of her adrenal suppression  ?  UTI: Her last urinalysis was normal without any growth on the culture, needs to follow-up with her infectious disease specialist  PLAN:  We will continue her Janumet unchanged Encouraged her to check that sugars more often especially after meals She also needs to be getting back to her walking program when she is able to  Prednisone dose: she will go up to at least 10 mg daily until she has completed resolution of her fever and infection Subsequently she will use 7.5 mg until her next visit and will give her a tapering schedule at that time  Patient Instructions  Prednisone 10mg  till infection clear and then 7.5mg  for 4 weeks   , 05/12/2016, 8:01 AM   Note: This office note was prepared with Estate agent. Any transcriptional errors that result from this process are unintentional.

## 2016-05-11 NOTE — Patient Instructions (Addendum)
Prednisone 10mg  till infection clear and then 7.5mg  for 4 weeks

## 2016-05-23 DIAGNOSIS — C9101 Acute lymphoblastic leukemia, in remission: Secondary | ICD-10-CM | POA: Diagnosis not present

## 2016-05-23 DIAGNOSIS — R509 Fever, unspecified: Secondary | ICD-10-CM | POA: Diagnosis not present

## 2016-05-23 DIAGNOSIS — D89811 Chronic graft-versus-host disease: Secondary | ICD-10-CM | POA: Diagnosis not present

## 2016-05-23 DIAGNOSIS — E876 Hypokalemia: Secondary | ICD-10-CM | POA: Diagnosis not present

## 2016-05-23 DIAGNOSIS — T380X5A Adverse effect of glucocorticoids and synthetic analogues, initial encounter: Secondary | ICD-10-CM | POA: Diagnosis not present

## 2016-05-23 DIAGNOSIS — D801 Nonfamilial hypogammaglobulinemia: Secondary | ICD-10-CM | POA: Diagnosis not present

## 2016-05-23 DIAGNOSIS — R739 Hyperglycemia, unspecified: Secondary | ICD-10-CM | POA: Diagnosis not present

## 2016-05-23 DIAGNOSIS — N12 Tubulo-interstitial nephritis, not specified as acute or chronic: Secondary | ICD-10-CM | POA: Diagnosis not present

## 2016-05-23 DIAGNOSIS — Z9484 Stem cells transplant status: Secondary | ICD-10-CM | POA: Diagnosis not present

## 2016-06-01 ENCOUNTER — Other Ambulatory Visit: Payer: Self-pay | Admitting: Endocrinology

## 2016-06-03 ENCOUNTER — Encounter (HOSPITAL_COMMUNITY): Payer: Self-pay

## 2016-06-03 ENCOUNTER — Emergency Department (HOSPITAL_COMMUNITY): Payer: Medicare Other

## 2016-06-03 ENCOUNTER — Inpatient Hospital Stay (HOSPITAL_COMMUNITY)
Admission: EM | Admit: 2016-06-03 | Discharge: 2016-06-05 | DRG: 835 | Disposition: A | Discharge status: Home / Self Care | Payer: Medicare Other | Attending: Family Medicine | Admitting: Family Medicine

## 2016-06-03 DIAGNOSIS — C9101 Acute lymphoblastic leukemia, in remission: Principal | ICD-10-CM | POA: Diagnosis present

## 2016-06-03 DIAGNOSIS — N183 Chronic kidney disease, stage 3 unspecified: Secondary | ICD-10-CM

## 2016-06-03 DIAGNOSIS — R509 Fever, unspecified: Secondary | ICD-10-CM | POA: Diagnosis not present

## 2016-06-03 DIAGNOSIS — Z8249 Family history of ischemic heart disease and other diseases of the circulatory system: Secondary | ICD-10-CM

## 2016-06-03 DIAGNOSIS — Z86711 Personal history of pulmonary embolism: Secondary | ICD-10-CM | POA: Diagnosis not present

## 2016-06-03 DIAGNOSIS — E785 Hyperlipidemia, unspecified: Secondary | ICD-10-CM | POA: Diagnosis present

## 2016-06-03 DIAGNOSIS — Z9484 Stem cells transplant status: Secondary | ICD-10-CM

## 2016-06-03 DIAGNOSIS — I129 Hypertensive chronic kidney disease with stage 1 through stage 4 chronic kidney disease, or unspecified chronic kidney disease: Secondary | ICD-10-CM | POA: Diagnosis present

## 2016-06-03 DIAGNOSIS — D899 Disorder involving the immune mechanism, unspecified: Secondary | ICD-10-CM

## 2016-06-03 DIAGNOSIS — I6200 Nontraumatic subdural hemorrhage, unspecified: Secondary | ICD-10-CM | POA: Diagnosis present

## 2016-06-03 DIAGNOSIS — Z79899 Other long term (current) drug therapy: Secondary | ICD-10-CM | POA: Diagnosis not present

## 2016-06-03 DIAGNOSIS — D89811 Chronic graft-versus-host disease: Secondary | ICD-10-CM | POA: Diagnosis not present

## 2016-06-03 DIAGNOSIS — D630 Anemia in neoplastic disease: Secondary | ICD-10-CM | POA: Diagnosis present

## 2016-06-03 DIAGNOSIS — J45909 Unspecified asthma, uncomplicated: Secondary | ICD-10-CM | POA: Diagnosis not present

## 2016-06-03 DIAGNOSIS — Z9481 Bone marrow transplant status: Secondary | ICD-10-CM

## 2016-06-03 DIAGNOSIS — E1122 Type 2 diabetes mellitus with diabetic chronic kidney disease: Secondary | ICD-10-CM | POA: Diagnosis present

## 2016-06-03 DIAGNOSIS — J8489 Other specified interstitial pulmonary diseases: Secondary | ICD-10-CM | POA: Diagnosis present

## 2016-06-03 DIAGNOSIS — Z794 Long term (current) use of insulin: Secondary | ICD-10-CM | POA: Diagnosis not present

## 2016-06-03 DIAGNOSIS — E1165 Type 2 diabetes mellitus with hyperglycemia: Secondary | ICD-10-CM | POA: Diagnosis not present

## 2016-06-03 DIAGNOSIS — Z7952 Long term (current) use of systemic steroids: Secondary | ICD-10-CM | POA: Diagnosis not present

## 2016-06-03 DIAGNOSIS — IMO0002 Reserved for concepts with insufficient information to code with codable children: Secondary | ICD-10-CM

## 2016-06-03 DIAGNOSIS — D849 Immunodeficiency, unspecified: Secondary | ICD-10-CM

## 2016-06-03 DIAGNOSIS — M546 Pain in thoracic spine: Secondary | ICD-10-CM | POA: Diagnosis present

## 2016-06-03 DIAGNOSIS — C9590 Leukemia, unspecified not having achieved remission: Secondary | ICD-10-CM | POA: Diagnosis not present

## 2016-06-03 DIAGNOSIS — H538 Other visual disturbances: Secondary | ICD-10-CM | POA: Diagnosis present

## 2016-06-03 DIAGNOSIS — I10 Essential (primary) hypertension: Secondary | ICD-10-CM | POA: Diagnosis not present

## 2016-06-03 DIAGNOSIS — R0781 Pleurodynia: Secondary | ICD-10-CM | POA: Diagnosis not present

## 2016-06-03 DIAGNOSIS — C9100 Acute lymphoblastic leukemia not having achieved remission: Secondary | ICD-10-CM | POA: Diagnosis present

## 2016-06-03 LAB — URINALYSIS, ROUTINE W REFLEX MICROSCOPIC
BILIRUBIN URINE: NEGATIVE
Bacteria, UA: NONE SEEN
GLUCOSE, UA: NEGATIVE mg/dL
KETONES UR: NEGATIVE mg/dL
LEUKOCYTES UA: NEGATIVE
NITRITE: NEGATIVE
PH: 5 (ref 5.0–8.0)
PROTEIN: NEGATIVE mg/dL
SQUAMOUS EPITHELIAL / LPF: NONE SEEN
Specific Gravity, Urine: 1.008 (ref 1.005–1.030)

## 2016-06-03 LAB — COMPREHENSIVE METABOLIC PANEL
ALT: 30 U/L (ref 14–54)
ANION GAP: 11 (ref 5–15)
AST: 42 U/L — ABNORMAL HIGH (ref 15–41)
Albumin: 3.2 g/dL — ABNORMAL LOW (ref 3.5–5.0)
Alkaline Phosphatase: 143 U/L — ABNORMAL HIGH (ref 38–126)
BILIRUBIN TOTAL: 0.5 mg/dL (ref 0.3–1.2)
BUN: 15 mg/dL (ref 6–20)
CALCIUM: 8.8 mg/dL — AB (ref 8.9–10.3)
CO2: 26 mmol/L (ref 22–32)
Chloride: 97 mmol/L — ABNORMAL LOW (ref 101–111)
Creatinine, Ser: 1.64 mg/dL — ABNORMAL HIGH (ref 0.44–1.00)
GFR calc Af Amer: 41 mL/min — ABNORMAL LOW (ref >60)
GFR, EST NON AFRICAN AMERICAN: 35 mL/min — AB (ref >60)
Glucose, Bld: 155 mg/dL — ABNORMAL HIGH (ref 65–99)
POTASSIUM: 2.9 mmol/L — AB (ref 3.5–5.1)
Sodium: 134 mmol/L — ABNORMAL LOW (ref 135–145)
TOTAL PROTEIN: 7.3 g/dL (ref 6.5–8.1)

## 2016-06-03 LAB — CBC WITH DIFFERENTIAL/PLATELET
Basophils Absolute: 0 10*3/uL (ref 0.0–0.1)
Basophils Relative: 0 %
Eosinophils Absolute: 0 10*3/uL (ref 0.0–0.7)
Eosinophils Relative: 0 %
HCT: 34.3 % — ABNORMAL LOW (ref 36.0–46.0)
Hemoglobin: 11.2 g/dL — ABNORMAL LOW (ref 12.0–15.0)
Lymphocytes Relative: 29 %
Lymphs Abs: 3.5 10*3/uL (ref 0.7–4.0)
MCH: 29.2 pg (ref 26.0–34.0)
MCHC: 32.7 g/dL (ref 30.0–36.0)
MCV: 89.6 fL (ref 78.0–100.0)
Monocytes Absolute: 1.8 10*3/uL — ABNORMAL HIGH (ref 0.1–1.0)
Monocytes Relative: 15 %
Neutro Abs: 6.7 10*3/uL (ref 1.7–7.7)
Neutrophils Relative %: 56 %
Platelets: 235 10*3/uL (ref 150–400)
RBC: 3.83 MIL/uL — ABNORMAL LOW (ref 3.87–5.11)
RDW: 14.8 % (ref 11.5–15.5)
WBC: 11.9 10*3/uL — ABNORMAL HIGH (ref 4.0–10.5)

## 2016-06-03 LAB — I-STAT CG4 LACTIC ACID, ED: Lactic Acid, Venous: 1.15 mmol/L (ref 0.5–1.9)

## 2016-06-03 LAB — GLUCOSE, CAPILLARY
GLUCOSE-CAPILLARY: 97 mg/dL (ref 65–99)
GLUCOSE-CAPILLARY: 82 mg/dL (ref 65–99)

## 2016-06-03 LAB — INFLUENZA PANEL BY PCR (TYPE A & B)
Influenza A By PCR: NEGATIVE
Influenza B By PCR: NEGATIVE

## 2016-06-03 MED ORDER — PIPERACILLIN-TAZOBACTAM 3.375 G IVPB
3.3750 g | Freq: Three times a day (TID) | INTRAVENOUS | Status: DC
  Administered 2016-06-03 – 2016-06-05 (×5): 3.375 g via INTRAVENOUS
  Filled 2016-06-03 (×7): qty 50

## 2016-06-03 MED ORDER — MAGNESIUM OXIDE 400 (241.3 MG) MG PO TABS
200.0000 mg | ORAL_TABLET | Freq: Every day | ORAL | Status: DC
  Administered 2016-06-04 – 2016-06-05 (×2): 200 mg via ORAL
  Filled 2016-06-03 (×2): qty 1

## 2016-06-03 MED ORDER — POTASSIUM CHLORIDE 10 MEQ/100ML IV SOLN
10.0000 meq | INTRAVENOUS | Status: AC
  Administered 2016-06-03 (×2): 10 meq via INTRAVENOUS
  Filled 2016-06-03 (×2): qty 100

## 2016-06-03 MED ORDER — FLUCONAZOLE 100 MG PO TABS
200.0000 mg | ORAL_TABLET | Freq: Two times a day (BID) | ORAL | Status: DC
  Administered 2016-06-03 – 2016-06-05 (×5): 200 mg via ORAL
  Filled 2016-06-03 (×5): qty 2

## 2016-06-03 MED ORDER — AMITRIPTYLINE HCL 10 MG PO TABS
20.0000 mg | ORAL_TABLET | Freq: Every day | ORAL | Status: DC
  Administered 2016-06-03 – 2016-06-04 (×2): 20 mg via ORAL
  Filled 2016-06-03 (×3): qty 2

## 2016-06-03 MED ORDER — PROPYLENE GLYCOL-GLYCERIN 0.6-0.6 % OP SOLN: 1.0000 [drp] | Freq: Three times a day (TID) | OPHTHALMIC | Status: DC

## 2016-06-03 MED ORDER — POTASSIUM CHLORIDE CRYS ER 20 MEQ PO TBCR
40.0000 meq | EXTENDED_RELEASE_TABLET | Freq: Once | ORAL | Status: AC
  Administered 2016-06-03: 40 meq via ORAL
  Filled 2016-06-03: qty 2

## 2016-06-03 MED ORDER — ACYCLOVIR 400 MG PO TABS
800.0000 mg | ORAL_TABLET | Freq: Two times a day (BID) | ORAL | Status: DC
  Administered 2016-06-03 – 2016-06-05 (×5): 800 mg via ORAL
  Filled 2016-06-03 (×5): qty 2

## 2016-06-03 MED ORDER — SODIUM CHLORIDE 0.9 % IV SOLN
INTRAVENOUS | Status: AC
  Administered 2016-06-03: 14:00:00 via INTRAVENOUS

## 2016-06-03 MED ORDER — ACETAMINOPHEN 500 MG PO TABS: 500.0000 mg | ORAL_TABLET | Freq: Four times a day (QID) | ORAL | Status: DC | PRN

## 2016-06-03 MED ORDER — MOMETASONE FURO-FORMOTEROL FUM 200-5 MCG/ACT IN AERO
2.0000 | INHALATION_SPRAY | Freq: Two times a day (BID) | RESPIRATORY_TRACT | Status: DC
  Filled 2016-06-03: qty 8.8

## 2016-06-03 MED ORDER — TRIAMCINOLONE ACETONIDE 0.1 % EX CREA
1.0000 "application " | TOPICAL_CREAM | Freq: Every day | CUTANEOUS | Status: DC
  Filled 2016-06-03: qty 15

## 2016-06-03 MED ORDER — SODIUM CHLORIDE 0.9 % IV BOLUS (SEPSIS)
1000.0000 mL | Freq: Once | INTRAVENOUS | Status: AC
  Administered 2016-06-03: 1000 mL via INTRAVENOUS

## 2016-06-03 MED ORDER — VANCOMYCIN HCL IN DEXTROSE 750-5 MG/150ML-% IV SOLN
750.0000 mg | Freq: Two times a day (BID) | INTRAVENOUS | Status: DC
  Administered 2016-06-03 – 2016-06-04 (×3): 750 mg via INTRAVENOUS
  Filled 2016-06-03 (×5): qty 150

## 2016-06-03 MED ORDER — GABAPENTIN 300 MG PO CAPS
300.0000 mg | ORAL_CAPSULE | Freq: Three times a day (TID) | ORAL | Status: DC
  Administered 2016-06-03 – 2016-06-05 (×6): 300 mg via ORAL
  Filled 2016-06-03 (×6): qty 1

## 2016-06-03 MED ORDER — POLYVINYL ALCOHOL 1.4 % OP SOLN
1.0000 [drp] | Freq: Three times a day (TID) | OPHTHALMIC | Status: DC
  Administered 2016-06-04: 1 [drp] via OPHTHALMIC
  Filled 2016-06-03: qty 15

## 2016-06-03 MED ORDER — MAGNESIUM 250 MG PO TABS: 250.0000 mg | ORAL_TABLET | Freq: Every day | ORAL | Status: DC

## 2016-06-03 MED ORDER — LORAZEPAM 1 MG PO TABS
1.0000 mg | ORAL_TABLET | ORAL | Status: DC | PRN
  Administered 2016-06-04: 1 mg via ORAL
  Filled 2016-06-03: qty 1

## 2016-06-03 MED ORDER — VANCOMYCIN HCL IN DEXTROSE 1-5 GM/200ML-% IV SOLN
1000.0000 mg | Freq: Once | INTRAVENOUS | Status: AC
  Administered 2016-06-03: 1000 mg via INTRAVENOUS
  Filled 2016-06-03: qty 200

## 2016-06-03 MED ORDER — SIROLIMUS 0.5 MG PO TABS
0.5000 mg | ORAL_TABLET | Freq: Every day | ORAL | Status: DC
  Administered 2016-06-03 – 2016-06-05 (×3): 0.5 mg via ORAL
  Filled 2016-06-03 (×3): qty 1

## 2016-06-03 MED ORDER — IBRUTINIB 140 MG PO TABS: 280.0000 mg | ORAL_TABLET | Freq: Every day | ORAL | Status: DC

## 2016-06-03 MED ORDER — PRAVASTATIN SODIUM 20 MG PO TABS
20.0000 mg | ORAL_TABLET | Freq: Every day | ORAL | Status: DC
  Administered 2016-06-03 – 2016-06-05 (×3): 20 mg via ORAL
  Filled 2016-06-03 (×3): qty 1

## 2016-06-03 MED ORDER — INSULIN ASPART 100 UNIT/ML ~~LOC~~ SOLN: 0.0000 [IU] | Freq: Every day | SUBCUTANEOUS | Status: DC

## 2016-06-03 MED ORDER — ONDANSETRON 4 MG PO TBDP
4.0000 mg | ORAL_TABLET | Freq: Three times a day (TID) | ORAL | Status: DC | PRN
  Administered 2016-06-03 (×2): 4 mg via ORAL
  Filled 2016-06-03 (×2): qty 1

## 2016-06-03 MED ORDER — INSULIN ASPART 100 UNIT/ML ~~LOC~~ SOLN
0.0000 [IU] | Freq: Three times a day (TID) | SUBCUTANEOUS | Status: DC
  Administered 2016-06-04: 3 [IU] via SUBCUTANEOUS
  Administered 2016-06-04: 2 [IU] via SUBCUTANEOUS
  Administered 2016-06-05: 1 [IU] via SUBCUTANEOUS

## 2016-06-03 MED ORDER — MYCOPHENOLATE MOFETIL 250 MG PO CAPS
500.0000 mg | ORAL_CAPSULE | Freq: Two times a day (BID) | ORAL | Status: DC
  Administered 2016-06-03 – 2016-06-05 (×5): 500 mg via ORAL
  Filled 2016-06-03 (×6): qty 2

## 2016-06-03 MED ORDER — PIPERACILLIN-TAZOBACTAM 3.375 G IVPB 30 MIN
3.3750 g | Freq: Once | INTRAVENOUS | Status: AC
  Administered 2016-06-03: 3.375 g via INTRAVENOUS
  Filled 2016-06-03: qty 50

## 2016-06-03 MED ORDER — SULFAMETHOXAZOLE-TRIMETHOPRIM 800-160 MG PO TABS
1.0000 | ORAL_TABLET | ORAL | Status: DC
  Administered 2016-06-05: 1 via ORAL
  Filled 2016-06-03: qty 1

## 2016-06-03 MED ORDER — PREDNISONE 5 MG PO TABS
10.0000 mg | ORAL_TABLET | Freq: Every day | ORAL | Status: DC
  Administered 2016-06-04 – 2016-06-05 (×2): 10 mg via ORAL
  Filled 2016-06-03 (×2): qty 2

## 2016-06-03 MED ORDER — ADULT MULTIVITAMIN W/MINERALS CH
1.0000 | ORAL_TABLET | Freq: Every day | ORAL | Status: DC
  Administered 2016-06-03 – 2016-06-05 (×3): 1 via ORAL
  Filled 2016-06-03 (×3): qty 1

## 2016-06-03 MED ORDER — ORAL CARE MOUTH RINSE: 15.0000 mL | Freq: Two times a day (BID) | OROMUCOSAL | Status: DC

## 2016-06-03 NOTE — Progress Notes (Signed)
Pharmacy Antibiotic Note  Lynn [name] is a 51 y.o. female with hx ALL s/p stem cell transplant in 2013 on ibrutinib, cellcept, and prednisone PTA presented the ED on 06/03/16 with c/o fever.  o start broad abx with zosyn and vancomycin for suspected sepsis.   Plan: - Zosyn 3.375 gm IV x1 over 30 min, then 3.375 gm IV q8h (infuse over 4 hours) - Vancomycin 1000 mg IV x 1, then 750 mg IV q12h - daily scr  __________________________  Weight: 216 lb 14.9 oz (98.4 kg) (per previous admission)  Temp (24hrs), Avg:98.3 F (36.8 C), Min:98.3 F (36.8 C), Max:98.3 F (36.8 C)   Recent Labs Lab 06/03/16 0832 06/03/16 0842  WBC 11.9*  --   LATICACIDVEN  --  1.15    CrCl cannot be calculated (Patient's most recent lab result is older than the maximum 21 days allowed.).    No Known Allergies   Thank you for allowing pharmacy to be a part of this patient's care.  Lynelle Doctor 06/03/2016 8:51 AM

## 2016-06-03 NOTE — Progress Notes (Signed)
Pharmacy: Ibrutinib   Ibrutinib (Imbruvica) hold criteria: . ANC < 750  . Platelet count < 50,000 . SCr > 1.5x baseline (or > 2 if baseline unknown) . Bili > 1.5x ULN . AST or ALT > 3 ULN . NYHA Class 3 or 4 Heart failure . Hemorrhage . 3-7 days pre- or post-surgery . Active infection  Will hold ibrutinib per P&T policy.  Dia Sitter, PharmD, BCPS 06/03/2016 1:29 PM

## 2016-06-03 NOTE — ED Provider Notes (Signed)
[facility] DEPT Provider Note   CSN: [id number] Arrival date & time: 06/03/16  0759     History   Chief Complaint Chief Complaint  Patient presents with  . Leukemia  . Fever    HPI Lynn [name] is a 51 y.o. female.  51yo F w/ PMH including ALL s/p transplant, SDH s/p craniotomy, HTN, T2DM who p/w fever. Patient reports that she developed a fever at home approximately one week ago associated with chills and loose stools. After a couple of days those symptoms resolved. She spiked a fever again 2 days ago which also resolved. Last night, she gradually developed a fever up to 102.8. She took 1 g Tylenol at 7 AM. She reports fatigue, mild dysuria, and nausea. No vomiting, diarrhea, or breathing problems. No skin rashes. She reports right upper thoracic back pain.   The history is provided by the patient.  Fever      Past Medical History:  Diagnosis Date  . ALL (acute lymphoblastic leukemia) ([place])   . Anemia   . Arthritis   . Brain bleed ([place])    07/04/11  . Diabetes mellitus without complication ([place])   . Headache(784.0)   . Hypertension   . Leukemia ([place])   . Pneumonia   . Pulmonary embolism ([place])    06/22/11  . Thrush of mouth and esophagus ([place]) 04/09/2014    Patient Active Problem List   Diagnosis Date Noted  . CKD (chronic kidney disease), stage III 06/03/2016  . Headache 11/16/2015  . Blurry vision, bilateral 10/07/2015  . Chronic headaches 05/06/2015  . Dehydration 05/21/2014  . Prerenal renal failure 05/21/2014  . Thrush of mouth and esophagus ([place]) 04/09/2014  . Chronic graft-versus-host disease ([place]) 04/09/2014  . Leukopenia 01/08/2014  . S/P allogeneic bone marrow transplant ([place]) 01/08/2014  . Asthma, chronic 12/13/2013  . Pulmonary infiltrates 12/13/2013  . Cryptogenic organizing pneumonia 08/27/2013  . BOOP (bronchiolitis obliterans with organizing pneumonia) ([place]) 08/08/2013  . Insulin dependent type 2 diabetes mellitus, uncontrolled ([place])  07/30/2013  . HCAP (healthcare-associated pneumonia) 06/10/2013  . History of pulmonary embolism 01/11/2013  . Greenfield filter in place 01/11/2013  . Port-a-cath in place 01/09/2013  . Right thyroid nodule 07/09/2012  . Fever 05/13/2012  . History of peripheral stem cell transplant ([place]) 01/12/2012  . Transfusion reaction 11/27/2011  . Acute lymphoblastic leukemia in remission ([place]) 08/21/2011  . Subdural hemorrhage ([facility]) 07/26/2011  . Essential hypertension 07/06/2011  . Thrombocytopenia ([place]) 06/17/2011  . Anemia in neoplastic disease 06/17/2011  . [place]ilar lymphadenopathy 06/17/2011    Past Surgical History:  Procedure Laterality Date  . BONE MARROW TRANSPLANT    . CESAREAN SECTION  2000  . CRANIOTOMY  07/04/11  . EYE SURGERY Right 2005   repair crossed eye  . FOOT SURGERY    . greenfield filter    . LUNG BIOPSY  06/19/13    OB History    Gravida Para Term Preterm AB Living   2 2 2     2    SAB TAB Ectopic Multiple Live Births                   Home Medications    Prior to Admission medications   Medication Sig Start Date End Date Taking? Authorizing Provider  acetaminophen (TYLENOL) 500 MG tablet Take 500 mg by mouth every 6 (six) hours as needed for pain.   Yes [provider]  acyclovir (ZOVIRAX) 800 MG tablet Take 1 tablet (800 mg total) by  mouth 2 (two) times daily. . 08/27/13  Yes Chism, David, MD  ADVAIR DISKUS 500-50 MCG/DOSE AEPB Inhale 1 puff into the lungs daily. 05/29/16  Yes [provider]  amitriptyline (ELAVIL) 10 MG tablet Take 2 tablets (20 mg total) by mouth at bedtime. 11/16/15  Yes Dennie Bible, NP  cetirizine (ZYRTEC) 10 MG tablet Take 1 tablet (10 mg total) by mouth daily. Patient taking differently: Take 10 mg by mouth daily as needed for allergies.  03/07/14  Yes Melony Overly, MD  fluconazole (DIFLUCAN) 200 MG tablet Take 200 mg by mouth 2 (two) times daily.  05/03/15  Yes [provider]  gabapentin (NEURONTIN)  300 MG capsule TAKE ONE CAPSULE BY MOUTH THREE TIMES DAILY Patient taking differently: TAKE ONE CAPSULE BY MOUTH QHS 02/02/16  Yes Dennie Bible, NP  hydrochlorothiazide (HYDRODIURIL) 25 MG tablet Take 25 mg by mouth daily. 05/29/16  Yes [provider]  IMBRUVICA 140 MG capsul Take 280 mg by mouth daily. Take 2 tablets once daily 09/28/15  Yes [provider]  KLOR-CON M20 20 MEQ tablet TAKE 1 TABLET (20 MEQ TOTAL) BY MOUTH DAILY. 05/01/16  Yes Elayne Snare, MD  LORazepam (ATIVAN) 1 MG tablet Take 1 mg by mouth every 4 (four) hours as needed for anxiety.  02/06/14  Yes [provider]  Magnesium 250 MG TABS Take 250 mg by mouth daily.   Yes [provider]  Multiple Vitamin (MULTIVITAMIN) tablet Take 1 tablet by mouth daily.   Yes [provider]  mycophenolate (CELLCEPT) 250 MG capsule Take 500 mg by mouth 2 (two) times daily. 05/23/16  Yes [provider]  NOVOLOG FLEXPEN 100 UNIT/ML FlexPen INJECT 16 UNITS WITH EACH MEAL PLUS 18-20 EXTRA UNITS IF SUGAR IS OVER 250 09/06/15  Yes Elayne Snare, MD  ondansetron (ZOFRAN ODT) 4 MG disintegrating tablet Take 1 tablet (4 mg total) by mouth every 8 (eight) hours as needed for nausea or vomiting. 04/29/16  Yes Nanavati, Ankit, MD  pravastatin (PRAVACHOL) 20 MG tablet TAKE 1 TABLET (20 MG TOTAL) BY MOUTH DAILY. 06/01/16  Yes Elayne Snare, MD  predniSONE (DELTASONE) 5 MG tablet Take 10 mg by mouth daily with breakfast.    Yes [provider]  Propylene Glycol-Glycerin (SOOTHE) 0.6-0.6 % SOLN Place 1 drop into both eyes 3 (three) times daily.   Yes [provider]  Sirolimus (RAPAMUNE) 0.5 MG TABS Take 0.5 mg by mouth daily.   Yes [provider]  SitaGLIPtin-MetFORMIN HCl (JANUMET XR) 50-1000 MG TB24 Take 2 tablets by mouth daily. 10/01/15  Yes Elayne Snare, MD  sulfamethoxazole-trimethoprim (BACTRIM DS,SEPTRA DS) 800-160 MG per tablet Take 1 tablet by mouth 3 (three) times a week.  Monday,Wednesday, and Friday 06/19/14  Yes Gorsuch, Ni, MD  triamcinolone cream (KENALOG) 0.1 % Apply 1 application topically daily. 03/23/16  Yes [provider]  [place] test strip USE TO TEST BLOOD GLUCOSE 2 TIMES DAILY 10/23/14   [provider]  Elastic Bandages & Supports (MEDICAL COMPRESSION SOCKS) MISC Please dispense one pair. 05/13/15   Etta Quill, NP  NOVOFINE PLUS 32G X 4 MM MISC  05/31/14   [provider]    Family History Family History  Problem Relation Age of Onset  . Pancreatic cancer Father   . Cancer Mother     colon  . Pancreatic cancer Mother   . Asthma Son   . Hypertension Sister   . Diabetes Neg Hx   . Heart  disease Neg Hx   . Breast cancer Neg Hx     Social History Social History  Substance Use Topics  . Smoking status: Never Smoker  . Smokeless tobacco: Never Used  . Alcohol use No     Allergies   Patient has no known allergies.   Review of Systems Review of Systems  Constitutional: Positive for fever.     Physical Exam Updated Vital Signs BP (!) 122/91 (BP Location: Right Arm)   Pulse 95   Temp 98.6 F (37 C) (Oral)   Resp 18   Ht 5\' 5"  (1.651 m)   Wt 219 lb 8 oz (99.6 kg)   LMP 08/09/2011   SpO2 100%   BMI 36.53 kg/m   Physical Exam  Constitutional: She is oriented to person, place, and time. She appears well-developed and well-nourished. No distress.  HENT:  Head: Normocephalic and atraumatic.  Mouth/Throat: Oropharynx is clear and moist.  Moist mucous membranes  Eyes: Conjunctivae are normal. Pupils are equal, round, and reactive to light.  Neck: Neck supple.  Cardiovascular: Regular rhythm and normal heart sounds.  Tachycardia present.   No murmur heard. Pulmonary/Chest: Effort normal and breath sounds normal.  Abdominal: Soft. Bowel sounds are normal. She exhibits no distension. There is no tenderness.  Musculoskeletal: She exhibits no edema.  Tenderness of R upper thoracic  paraspinal muscles with no skin changes; no midline spinal tenderness  Neurological: She is alert and oriented to person, place, and time.  5/5 strength x all 4 ext, Fluent speech  Skin: Skin is warm and dry. No rash noted. No erythema.  Psychiatric: She has a normal mood and affect. Judgment normal.  Nursing note and vitals reviewed.    ED Treatments / Results  Labs (all labs ordered are listed, but only abnormal results are displayed) Labs Reviewed  COMPREHENSIVE METABOLIC PANEL - Abnormal; Notable for the following:       Result Value   Sodium 134 (*)    Potassium 2.9 (*)    Chloride 97 (*)    Glucose, Bld 155 (*)    Creatinine, Ser 1.64 (*)    Calcium 8.8 (*)    Albumin 3.2 (*)    AST 42 (*)    Alkaline Phosphatase 143 (*)    GFR calc non Af Amer 35 (*)    GFR calc Af Amer 41 (*)    All other components within normal limits  CBC WITH DIFFERENTIAL/PLATELET - Abnormal; Notable for the following:    WBC 11.9 (*)    RBC 3.83 (*)    Hemoglobin 11.2 (*)    HCT 34.3 (*)    Monocytes Absolute 1.8 (*)    All other components within normal limits  URINALYSIS, ROUTINE W REFLEX MICROSCOPIC - Abnormal; Notable for the following:    Hgb urine dipstick SMALL (*)    All other components within normal limits  CULTURE, BLOOD (ROUTINE X 2)  CULTURE, BLOOD (ROUTINE X 2)  URINE CULTURE  CULTURE, EXPECTORATED SPUTUM-ASSESSMENT  INFLUENZA PANEL BY PCR (TYPE A & B)  HIV ANTIBODY (ROUTINE TESTING)  I-STAT CG4 LACTIC ACID, ED    EKG  EKG Interpretation  Date/Time:  Saturday Jun 03 2016 09:10:58 EDT Ventricular Rate:  97 PR Interval:    QRS Duration: 93 QT Interval:  383 QTC Calculation: 487 R Axis:   36 Text Interpretation:  Sinus rhythm Low voltage, precordial leads Abnormal R-wave progression, early transition Borderline T wave abnormalities Borderline prolonged QT interval No significant change since  last tracing Confirmed by  MD, Dix [id number]) on 06/03/2016 9:28:27 AM         Radiology Dg Chest 2 View  Result Date: 06/03/2016 CLINICAL DATA:  Fever.  Leukemia. EXAM: CHEST  2 VIEW COMPARISON:  10/10/2015 chest radiograph. FINDINGS: Right subclavian MediPort terminates at the cavoatrial junction. Stable cardiomediastinal silhouette with normal heart size. No pneumothorax. No pleural effusion. No pulmonary edema. Surgical sutures overlie the left upper lung. No acute consolidative airspace disease. IMPRESSION: No active cardiopulmonary disease. Electronically Signed   By: Ilona Sorrel [name]   On: 06/03/2016 09:55    Procedures Procedures (including critical care time)  Medications Ordered in ED Medications  piperacillin-tazobactam (ZOSYN) IVPB 3.375 g (not administered)  vancomycin (VANCOCIN) IVPB 750 mg/150 ml premix (not administered)  acetaminophen (TYLENOL) tablet 500 mg (not administered)  mometasone-formoterol (DULERA) 200-5 MCG/ACT inhaler 2 puff (not administered)  mycophenolate (CELLCEPT) capsule 500 mg (not administered)  triamcinolone cream (KENALOG) 0.1 % 1 application (not administered)  pravastatin (PRAVACHOL) tablet 20 mg (not administered)  ondansetron (ZOFRAN-ODT) disintegrating tablet 4 mg (4 mg Oral Given 06/03/16 1408)  predniSONE (DELTASONE) tablet 10 mg (not administered)  gabapentin (NEURONTIN) capsule 300 mg (not administered)  amitriptyline (ELAVIL) tablet 20 mg (not administered)  Sirolimus TABS 0.5 mg (not administered)  fluconazole (DIFLUCAN) tablet 200 mg (not administered)  sulfamethoxazole-trimethoprim (BACTRIM DS,SEPTRA DS) 800-160 MG per tablet 1 tablet (not administered)  LORazepam (ATIVAN) tablet 1 mg (not administered)  acyclovir (ZOVIRAX) tablet 800 mg (not administered)  multivitamin with minerals tablet 1 tablet (not administered)  insulin aspart (novoLOG) injection 0-9 Units (not administered)  insulin aspart (novoLOG) injection 0-5 Units (not administered)  0.9 %  sodium chloride infusion ( Intravenous New Bag/Given  06/03/16 1408)  polyvinyl alcohol (LIQUIFILM TEARS) 1.4 % ophthalmic solution 1 drop (not administered)  magnesium oxide (MAG-OX) tablet 200 mg (not administered)  piperacillin-tazobactam (ZOSYN) IVPB 3.375 g (0 g Intravenous Stopped 06/03/16 0911)  vancomycin (VANCOCIN) IVPB 1000 mg/200 mL premix (0 mg Intravenous Stopped 06/03/16 1031)  sodium chloride 0.9 % bolus 1,000 mL (0 mLs Intravenous Stopped 06/03/16 0950)  potassium chloride 10 mEq in 100 mL IVPB (10 mEq Intravenous New Bag/Given 06/03/16 1159)  potassium chloride SA (K-DUR,KLOR-CON) CR tablet 40 mEq (40 mEq Oral Given 06/03/16 1025)     Initial Impression / Assessment and Plan / ED Course  I have reviewed the triage vital signs and the nursing notes.  Pertinent labs & imaging results that were available during my care of the patient were reviewed by me and considered in my medical decision making (see chart for details).     PT w/ h/o ALL on immunosuppression p/w 1 week of intermittent fevers. She was awake and alert, nontoxic on exam. Heart rate 122 but the remainder of her vital signs were normal. Clear breath sounds. She complained of some right thoracic paraspinal muscle tenderness but she had no skin changes or swelling at that site. Because of her immunosuppression, initiated a code sepsis with blood and urine cultures, Vanc and Zosyn, labs including lactate.  Labwork shows potassium 2.9, creatinine 1.6, WBC 11.9. No evidence of infection on UA or chest x-ray. Gave potassium repletion as well as IV fluids. I discussed the patient with [facility], Dr. Florene Glen, who felt comfortable admitting the patient here and is available for rediscussion if the patient has any complications or new issues that arise. Discussed admission with hospitalist, Dr. Cruzita Lederer, and pt admitted for further care.  Final Clinical Impressions(s) /  ED Diagnoses   Final diagnoses:  Fever, unspecified fever cause  Immunosuppression [facility])    New  Prescriptions Current Discharge Medication List       , Wenda Overland, MD 06/03/16 1515

## 2016-06-03 NOTE — ED Notes (Signed)
#   1 culture from port-a-cath

## 2016-06-03 NOTE — H&P (Signed)
History and Physical    Lynn [name]:[id number] DOB: 1965/11/25 DOA: 06/03/2016  I have briefly reviewed the patient's prior medical records in [facility]  PCP: Alroy Dust, L.Marlou Sa, MD  Patient coming from: Home  Chief Complaint: Fever  HPI: Lynn [name] is a 51 y.o. female with medical history significant of ALL currently in remission status post autologous stem cell transplant, with posttransplant complications with chronic graft-versus-host disease, also history of pulmonary embolism as well as intracranial hemorrhage s/p external ventricular drain placement, also subdural hematoma s/p suboccipital craniotomy for decompression in 2013, currently having an IVC filter in place, on chronic immunosuppression, presents to the emergency room with a chief complaint of fever on and off for the past 1 week.  She has been having intermittent fevers at home up to 101-102, for which she has been taking Tylenol.  She has been having associated intermittent left-sided and right-sided flank pain, which reminds her of her previous urinary tract infections, as well as has been complaining of mild dysuria which comes and goes over the last week.  She also been having intermittent diarrheal episodes which have now resolved.  C. difficile was checked about a month ago and it returned negative.  She has also been having intermittent left lower extremity swelling, and she had an ultrasound done last month which was negative for DVT as well.  She currently has no chest pain, no shortness of breath, no cough or chest congestion.  She denies any abdominal pain, has mild nausea however no vomiting.  No lightheadedness or dizziness.  She tells me she had a fever of 102 this morning for which she took 1 g of Tylenol just prior to coming to the emergency room  ED Course: In the emergency room she is afebrile, initially she was to be tachycardic with heart rate of 120 however this improved with fluids.  Her blood  pressure is stable and she is satting 100% on room air.  Her blood work is pertinent for a potassium of 2.9, creatinine is 1.6.  She has mild leukocytosis with a white count of 11.9.  Lactic acid is normal.  Chest x-ray was negative for acute findings a urinalysis is negative.  Blood cultures were obtained from periphery as well as her port, and TRH is asked for admission for fever in an immunosuppressed patient.  Review of Systems: As per HPI otherwise 10 point review of systems negative.   Past Medical History:  Diagnosis Date  . ALL (acute lymphoblastic leukemia) ([place])   . Anemia   . Arthritis   . Brain bleed ([place])    07/04/11  . Diabetes mellitus without complication ([place])   . Headache(784.0)   . Hypertension   . Leukemia ([place])   . Pneumonia   . Pulmonary embolism ([place])    06/22/11  . Thrush of mouth and esophagus ([place]) 04/09/2014    Past Surgical History:  Procedure Laterality Date  . BONE MARROW TRANSPLANT    . CESAREAN SECTION  2000  . CRANIOTOMY  07/04/11  . EYE SURGERY Right 2005   repair crossed eye  . FOOT SURGERY    . [place] filter    . LUNG BIOPSY  06/19/13     reports that she has never smoked. She has never used smokeless tobacco. She reports that she does not drink alcohol or use drugs.  No Known Allergies  Family History  Problem Relation Age of Onset  . Pancreatic cancer Father   . Cancer  Mother     colon  . Pancreatic cancer Mother   . Asthma Son   . Hypertension Sister   . Diabetes Neg Hx   . Heart disease Neg Hx   . Breast cancer Neg Hx     Prior to Admission medications   Medication Sig Start Date End Date Taking? Authorizing Provider  acetaminophen (TYLENOL) 500 MG tablet Take 500 mg by mouth every 6 (six) hours as needed for pain.   Yes [provider]  acyclovir (ZOVIRAX) 800 MG tablet Take 1 tablet (800 mg total) by mouth 2 (two) times daily. . 08/27/13  Yes Chism, David, MD  ADVAIR DISKUS 500-50 MCG/DOSE AEPB Inhale 1 puff into the  lungs daily. 05/29/16  Yes [provider]  amitriptyline (ELAVIL) 10 MG tablet Take 2 tablets (20 mg total) by mouth at bedtime. 11/16/15  Yes Dennie Bible, NP  cetirizine (ZYRTEC) 10 MG tablet Take 1 tablet (10 mg total) by mouth daily. Patient taking differently: Take 10 mg by mouth daily as needed for allergies.  03/07/14  Yes Melony Overly, MD  fluconazole (DIFLUCAN) 200 MG tablet Take 200 mg by mouth 2 (two) times daily.  05/03/15  Yes [provider]  gabapentin (NEURONTIN) 300 MG capsule TAKE ONE CAPSULE BY MOUTH THREE TIMES DAILY Patient taking differently: TAKE ONE CAPSULE BY MOUTH QHS 02/02/16  Yes Dennie Bible, NP  hydrochlorothiazide (HYDRODIURIL) 25 MG tablet Take 25 mg by mouth daily. 05/29/16  Yes [provider]  IMBRUVICA 140 MG capsul Take 280 mg by mouth daily. Take 2 tablets once daily 09/28/15  Yes [provider]  KLOR-CON M20 20 MEQ tablet TAKE 1 TABLET (20 MEQ TOTAL) BY MOUTH DAILY. 05/01/16  Yes Elayne Snare, MD  LORazepam (ATIVAN) 1 MG tablet Take 1 mg by mouth every 4 (four) hours as needed for anxiety.  02/06/14  Yes [provider]  Magnesium 250 MG TABS Take 250 mg by mouth daily.   Yes [provider]  Multiple Vitamin (MULTIVITAMIN) tablet Take 1 tablet by mouth daily.   Yes [provider]  mycophenolate (CELLCEPT) 250 MG capsule Take 500 mg by mouth 2 (two) times daily. 05/23/16  Yes [provider]  NOVOLOG FLEXPEN 100 UNIT/ML FlexPen INJECT 16 UNITS WITH EACH MEAL PLUS 18-20 EXTRA UNITS IF SUGAR IS OVER 250 09/06/15  Yes Elayne Snare, MD  ondansetron (ZOFRAN ODT) 4 MG disintegrating tablet Take 1 tablet (4 mg total) by mouth every 8 (eight) hours as needed for nausea or vomiting. 04/29/16  Yes Nanavati, Ankit, MD  pravastatin (PRAVACHOL) 20 MG tablet TAKE 1 TABLET (20 MG TOTAL) BY MOUTH DAILY. 06/01/16  Yes Elayne Snare, MD  predniSONE (DELTASONE) 5 MG tablet Take 10 mg by mouth daily with  breakfast.    Yes [provider]  Propylene Glycol-Glycerin (SOOTHE) 0.6-0.6 % SOLN Place 1 drop into both eyes 3 (three) times daily.   Yes [provider]  Sirolimus (RAPAMUNE) 0.5 MG TABS Take 0.5 mg by mouth daily.   Yes [provider]  SitaGLIPtin-MetFORMIN HCl (JANUMET XR) 50-1000 MG TB24 Take 2 tablets by mouth daily. 10/01/15  Yes Elayne Snare, MD  sulfamethoxazole-trimethoprim (BACTRIM DS,SEPTRA DS) 800-160 MG per tablet Take 1 tablet by mouth 3 (three) times a week. Monday,Wednesday, and Friday 06/19/14  Yes Gorsuch, Ni, MD  triamcinolone cream (KENALOG) 0.1 % Apply 1 application topically daily. 03/23/16  Yes [provider]  ACCU-CHEK AVIVA PLUS test strip USE TO TEST  BLOOD GLUCOSE 2 TIMES DAILY 10/23/14   [provider]  Elastic Bandages & Supports (MEDICAL COMPRESSION SOCKS) MISC Please dispense one pair. 05/13/15   Etta Quill, NP  NOVOFINE PLUS 32G X 4 MM MISC  05/31/14   [provider]    Physical Exam: Vitals:   06/03/16 1042 06/03/16 1045 06/03/16 1052 06/03/16 1142  BP:   126/82 117/82  Pulse: 92 98  92  Resp: 15 (!) 29  18  Temp:    98 F (36.7 C)  TempSrc:    Oral  SpO2: 100% 100%  100%  Weight:       Constitutional: NAD, calm, comfortable Eyes: ids and conjunctivae normal ENMT: Mucous membranes are moist. Posterior pharynx clear of any exudate or lesions. Neck: normal, supple Respiratory: clear to auscultation bilaterally, no wheezing, no crackles. Normal respiratory effort. No accessory muscle use.  Cardiovascular: Regular rate and rhythm, no murmurs / rubs / gallops. No extremity edema. 2+ pedal pulses.  Abdomen: no tenderness, no masses palpated. Bowel sounds positive.  Musculoskeletal: no clubbing / cyanosis. Normal muscle tone.  Skin: no rashes, lesions, ulcers. No induration Neurologic: CN 2-12 grossly intact. Strength 5/5 in all 4.  Psychiatric: Normal judgment and insight. Alert and oriented x 3. Normal  mood.   Labs on Admission: I have personally reviewed following labs and imaging studies  CBC:  Recent Labs Lab 06/03/16 0832  WBC 11.9*  NEUTROABS 6.7  HGB 11.2*  HCT 34.3*  MCV 89.6  PLT 644   Basic Metabolic Panel:  Recent Labs Lab 06/03/16 0832  NA 134*  K 2.9*  CL 97*  CO2 26  GLUCOSE 155*  BUN 15  CREATININE 1.64*  CALCIUM 8.8*   GFR: Estimated Creatinine Clearance: 48 mL/min (A) (by C-G formula based on SCr of 1.64 mg/dL (H)). Liver Function Tests:  Recent Labs Lab 06/03/16 0832  AST 42*  ALT 30  ALKPHOS 143*  BILITOT 0.5  PROT 7.3  ALBUMIN 3.2*   No results for input(s): LIPASE, AMYLASE in the last 168 hours. No results for input(s): AMMONIA in the last 168 hours. Coagulation Profile: No results for input(s): INR, PROTIME in the last 168 hours. Cardiac Enzymes: No results for input(s): CKTOTAL, CKMB, CKMBINDEX, TROPONINI in the last 168 hours. BNP (last 3 results) No results for input(s): PROBNP in the last 8760 hours. HbA1C: No results for input(s): HGBA1C in the last 72 hours. CBG: No results for input(s): GLUCAP in the last 168 hours. Lipid Profile: No results for input(s): CHOL, HDL, LDLCALC, TRIG, CHOLHDL, LDLDIRECT in the last 72 hours. Thyroid Function Tests: No results for input(s): TSH, T4TOTAL, FREET4, T3FREE, THYROIDAB in the last 72 hours. Anemia Panel: No results for input(s): VITAMINB12, FOLATE, FERRITIN, TIBC, IRON, RETICCTPCT in the last 72 hours. Urine analysis:    Component Value Date/Time   COLORURINE YELLOW 06/03/2016 1048   APPEARANCEUR CLEAR 06/03/2016 1048   LABSPEC 1.008 06/03/2016 1048   LABSPEC 1.025 10/07/2015 1039   PHURINE 5.0 06/03/2016 1048   GLUCOSEU NEGATIVE 06/03/2016 1048   GLUCOSEU Negative 10/07/2015 1039   HGBUR SMALL (A) 06/03/2016 1048   BILIRUBINUR NEGATIVE 06/03/2016 1048   BILIRUBINUR Color Interference 10/07/2015 1039   KETONESUR NEGATIVE 06/03/2016 1048   PROTEINUR NEGATIVE 06/03/2016 1048    UROBILINOGEN 0.2 10/07/2015 1039   NITRITE NEGATIVE 06/03/2016 1048   LEUKOCYTESUR NEGATIVE 06/03/2016 1048   LEUKOCYTESUR Moderate 10/07/2015 1039    Radiological Exams on Admission: Dg Chest 2 View  Result Date: 06/03/2016  CLINICAL DATA:  Fever.  Leukemia. EXAM: CHEST  2 VIEW COMPARISON:  10/10/2015 chest radiograph. FINDINGS: Right subclavian MediPort terminates at the cavoatrial junction. Stable cardiomediastinal silhouette with normal heart size. No pneumothorax. No pleural effusion. No pulmonary edema. Surgical sutures overlie the left upper lung. No acute consolidative airspace disease. IMPRESSION: No active cardiopulmonary disease. Electronically Signed   By: Ilona Sorrel [name]   On: 06/03/2016 09:55    EKG: Independently reviewed.  Sinus rhythm  Assessment/Plan Active Problems:   Anemia in neoplastic disease   Acute lymphoblastic leukemia in remission [facility])   Essential hypertension   History of peripheral stem cell transplant (HCC)   Subdural hemorrhage (HCC)   Fever   History of pulmonary embolism   Insulin dependent type 2 diabetes mellitus, uncontrolled ([place])   BOOP (bronchiolitis obliterans with organizing pneumonia) ([place])   Asthma, chronic   S/P allogeneic bone marrow transplant ([place])   Chronic graft-versus-host disease (HCC)   Blurry vision, bilateral   CKD (chronic kidney disease), stage III   Febrile illness -Unclear etiology, she is was concerned about a kidney infection however her urinalysis is negative and she has no CVA tenderness.  Influenza was checked and returned negative -Blood cultures and urine cultures were obtained in the emergency room, apparently set #1 from blood cultures was done from the port rather than periphery per RN -Patient was started on vancomycin and Zosyn, continue -DVT is also in the differential, however she recently had an ultrasound which was negative, and clinically does not appear to have a PE without any chest pain, without any  shortness of breath without any significant tachycardia  Acute leukemia, in remission -Status post bone marrow transplant, very complicated posttransplant course currently on immunosuppression with CellCept, prednisone,Imbruvica as well as Sirolimus.  She is also on acyclovir as well as Bactrim and Diflucan for prophylaxis.  Continue her home immunosuppressive regimen, she currently is not septic and there is no clear source for her fever -EDP discussed with hematology on call over at [facility] and they are comfortable with patient staying here, and if things get worse they will be more than happy to accept her in transfer  Chronic kidney disease stage III -Her creatinine appears at baseline  History of asthma -No wheezing, continue home medications  Hyperlipidemia -Continue pravastatin  History of intracranial hemorrhage and subdural hemorrhage -that was in the setting of thrombocytopenia in 2013  Type 2 diabetes mellitus with renal and neurologic complications -Hold home oral agents, place on sliding scale insulin -Continue gabapentin as well  Hypertension -Hold hydrochlorothiazide and she will be getting gentle IV fluids   DVT prophylaxis: SCDs Code Status: Full code Family Communication: No family at bedside Disposition Plan: Admit to [facility] called: None    Admission status: Inpatient  At the time of admission, it appears that the appropriate admission status for this patient is INPATIENT. This is judged to be reasonable and necessary in order to provide the required high service intensity to ensure the patient's safety given the presenting symptoms, physical exam findings, and initial radiographic and laboratory data in the context of their chronic comorbidities. Current circumstances are FUO in immunocompromised patient in need of IV antibiotics, blood culture follow up will take at least 72 hours, and it is felt to place patient at high risk for further  clinical deterioration threatening life, limb, or organ. Moreover, it is my clinical judgment that the patient will require inpatient hospital care spanning beyond 2 midnights from the point  of admission and that early discharge would result in unnecessary risk of decompensation and readmission or threat to life, limb or bodily function.   Marzetta Board, MD [facility] Pager [phone]  If 7PM-7AM, please contact night-coverage [email or web address] Password [email or web address]  06/03/2016, 12:17 PM

## 2016-06-03 NOTE — Progress Notes (Signed)
Pt. arrived to floor from ED via stretcher. No respiratory distress noted.

## 2016-06-03 NOTE — ED Notes (Signed)
Report given to Monica

## 2016-06-03 NOTE — ED Triage Notes (Signed)
She tells me she is in "remission from leukemia"; and has had fever since yesterday, which was at its max 102.8 yesterday evening. She is in no distress.

## 2016-06-03 NOTE — ED Notes (Signed)
amb to br

## 2016-06-04 ENCOUNTER — Inpatient Hospital Stay (HOSPITAL_COMMUNITY): Payer: Medicare Other

## 2016-06-04 DIAGNOSIS — Z794 Long term (current) use of insulin: Secondary | ICD-10-CM

## 2016-06-04 DIAGNOSIS — E1165 Type 2 diabetes mellitus with hyperglycemia: Secondary | ICD-10-CM

## 2016-06-04 DIAGNOSIS — I6200 Nontraumatic subdural hemorrhage, unspecified: Secondary | ICD-10-CM

## 2016-06-04 DIAGNOSIS — Z86711 Personal history of pulmonary embolism: Secondary | ICD-10-CM

## 2016-06-04 DIAGNOSIS — D899 Disorder involving the immune mechanism, unspecified: Secondary | ICD-10-CM

## 2016-06-04 LAB — GLUCOSE, CAPILLARY
GLUCOSE-CAPILLARY: 99 mg/dL (ref 65–99)
GLUCOSE-CAPILLARY: 142 mg/dL — AB (ref 65–99)
Glucose-Capillary: 211 mg/dL — ABNORMAL HIGH (ref 65–99)
Glucose-Capillary: 157 mg/dL — ABNORMAL HIGH (ref 65–99)

## 2016-06-04 LAB — COMPREHENSIVE METABOLIC PANEL
ALK PHOS: 118 U/L (ref 38–126)
ALT: 28 U/L (ref 14–54)
AST: 45 U/L — ABNORMAL HIGH (ref 15–41)
Albumin: 2.6 g/dL — ABNORMAL LOW (ref 3.5–5.0)
Anion gap: 8 (ref 5–15)
BUN: 11 mg/dL (ref 6–20)
CALCIUM: 8.2 mg/dL — AB (ref 8.9–10.3)
CO2: 25 mmol/L (ref 22–32)
CREATININE: 1.34 mg/dL — AB (ref 0.44–1.00)
Chloride: 103 mmol/L (ref 101–111)
GFR, EST AFRICAN AMERICAN: 52 mL/min — AB (ref >60)
GFR, EST NON AFRICAN AMERICAN: 45 mL/min — AB (ref >60)
Glucose, Bld: 118 mg/dL — ABNORMAL HIGH (ref 65–99)
Potassium: 3.1 mmol/L — ABNORMAL LOW (ref 3.5–5.1)
Sodium: 136 mmol/L (ref 135–145)
Total Bilirubin: 0.4 mg/dL (ref 0.3–1.2)
Total Protein: 6.2 g/dL — ABNORMAL LOW (ref 6.5–8.1)

## 2016-06-04 LAB — CBC
HCT: 29.9 % — ABNORMAL LOW (ref 36.0–46.0)
Hemoglobin: 9.6 g/dL — ABNORMAL LOW (ref 12.0–15.0)
MCH: 29.1 pg (ref 26.0–34.0)
MCHC: 32.1 g/dL (ref 30.0–36.0)
MCV: 90.6 fL (ref 78.0–100.0)
PLATELETS: 185 10*3/uL (ref 150–400)
RBC: 3.3 MIL/uL — AB (ref 3.87–5.11)
RDW: 14.7 % (ref 11.5–15.5)
WBC: 9.5 10*3/uL (ref 4.0–10.5)

## 2016-06-04 LAB — URINE CULTURE

## 2016-06-04 MED ORDER — POTASSIUM CHLORIDE CRYS ER 20 MEQ PO TBCR
40.0000 meq | EXTENDED_RELEASE_TABLET | ORAL | Status: AC
  Administered 2016-06-04 (×2): 40 meq via ORAL
  Filled 2016-06-04 (×2): qty 2

## 2016-06-04 NOTE — Progress Notes (Signed)
PROGRESS NOTE    ROSELLEN LICHTENBERGER  OTL:[id number] DOB: [birth year] DOA: 06/03/2016 PCP: Alroy Dust, L.Marlou Sa, MD   Brief Narrative: Lynn [name] is a 51 y.o. female with medical history significant of ALL currently in remission status post autologous stem cell transplant, with posttransplant complications with chronic graft-versus-host disease, also history of pulmonary embolism as well as intracranial hemorrhage s/p external ventricular drain placement, also subdural hematoma s/p suboccipital craniotomy for decompression in 2013, currently having an IVC filter in place, on chronic immunosuppression. She presented with fever of unknown origin. He follows with [facility]:   Active Problems:   Anemia in neoplastic disease   Acute lymphoblastic leukemia in remission [facility])   Essential hypertension   History of peripheral stem cell transplant (HCC)   Subdural hemorrhage (HCC)   Fever   History of pulmonary embolism   Insulin dependent type 2 diabetes mellitus, uncontrolled ([employer])   BOOP (bronchiolitis obliterans with organizing pneumonia) ([place])   Asthma, chronic   S/P allogeneic bone marrow transplant ([place])   Chronic graft-versus-host disease (HCC)   Blurry vision, bilateral   CKD (chronic kidney disease), stage III   Febrile illness History does not suggest a source for infection. Influenza was negative. No evidence of pneumonia, intra-abdominal process, UTI. Blood cultures sent. Urine culture significant for insignificant growth -Continue broad-spectrum antibiotics -Trend fever curve -Blood cultures pending  Acute leukemia Currently remission. Patient follows up with [facility], prednisone, sirolimus -Imbruvica held secondary to concern for acute infection  Chronic kidney disease, stage III Stable  Asthma Asymptomatic -Continue Advair  Hyperlipidemia -Continue pravastatin  History of intracranial  hemorrhage History of subdural hemorrhage Secondary to thrombocytopenia in 2013  Type 2 diabetes mellitus -Continue slides [place] -Continue gabapentin  Essential hypertension Normotensive -We'll continue to hold hydrochlorothiazide    DVT prophylaxis: SCDs Code Status: Full code Family Communication: None at bedside Disposition Plan: Discharge home pending workup for fever   Consultants:   None  Procedures:   None  Antimicrobials:  Vancomycin (5/5>>  Zosyn (5/5>>    Subjective: Patient reports no cough, sneezing, rhinorrhea, chest pain, dyspnea, abdominal pain, diarrhea and constipation, dysuria. Afebrile overnight. She does report some right thoracic back pain has been present for the past few days.  Objective: Vitals:   06/03/16 1320 06/03/16 1742 06/03/16 2129 06/04/16 0633  BP: (!) 122/91  132/71 114/62  Pulse: 95  98 95  Resp: 18  18 16   Temp: 98.6 F (37 C) 98.7 F (37.1 C) 100.2 F (37.9 C) 100 F (37.8 C)  TempSrc: Oral  Oral Oral  SpO2: 100%  100% 100%  Weight: 99.6 kg (219 lb 8 oz)     Height: 5\' 5"  (1.651 m)       Intake/Output Summary (Last 24 hours) at 06/04/16 1253 Last data filed at 06/04/16 0900  Gross per 24 hour  Intake             1950 ml  Output             2700 ml  Net             -750 ml   Filed Weights   06/03/16 0810 06/03/16 1320  Weight: 98.4 kg (216 lb 14.9 oz) 99.6 kg (219 lb 8 oz)    Examination:  General exam: Appears calm and comfortable Respiratory system: Clear to auscultation. Respiratory effort normal. Cardiovascular system: S1 & S2 heard, RRR. No murmurs. Gastrointestinal system: Abdomen  is nondistended, soft and nontender. Normal bowel sounds heard. Central nervous system: Alert and oriented. No focal neurological deficits. Musculoskeletal: No edema. No calf tenderness. Reproducible tenderness over her right sided ribs on her back Skin: No cyanosis. No rashes Psychiatry: Judgement and insight appear  normal. Mood & affect appropriate.     Data Reviewed: I have personally reviewed following labs and imaging studies  CBC:  Recent Labs Lab 06/03/16 0832 06/04/16 0500  WBC 11.9* 9.5  NEUTROABS 6.7  --   HGB 11.2* 9.6*  HCT 34.3* 29.9*  MCV 89.6 90.6  PLT 235 536   Basic Metabolic Panel:  Recent Labs Lab 06/03/16 0832 06/04/16 0500  NA 134* 136  K 2.9* 3.1*  CL 97* 103  CO2 26 25  GLUCOSE 155* 118*  BUN 15 11  CREATININE 1.64* 1.34*  CALCIUM 8.8* 8.2*   GFR: Estimated Creatinine Clearance: 58 mL/min (A) (by C-G formula based on SCr of 1.34 mg/dL (H)). Liver Function Tests:  Recent Labs Lab 06/03/16 0832 06/04/16 0500  AST 42* 45*  ALT 30 28  ALKPHOS 143* 118  BILITOT 0.5 0.4  PROT 7.3 6.2*  ALBUMIN 3.2* 2.6*   No results for input(s): LIPASE, AMYLASE in the last 168 hours. No results for input(s): AMMONIA in the last 168 hours. Coagulation Profile: No results for input(s): INR, PROTIME in the last 168 hours. Cardiac Enzymes: No results for input(s): CKTOTAL, CKMB, CKMBINDEX, TROPONINI in the last 168 hours. BNP (last 3 results) No results for input(s): PROBNP in the last 8760 hours. HbA1C: No results for input(s): HGBA1C in the last 72 hours. CBG:  Recent Labs Lab 06/03/16 1717 06/03/16 2138 06/04/16 0806 06/04/16 1251  GLUCAP 97 82 99 211*   Lipid Profile: No results for input(s): CHOL, HDL, LDLCALC, TRIG, CHOLHDL, LDLDIRECT in the last 72 hours. Thyroid Function Tests: No results for input(s): TSH, T4TOTAL, FREET4, T3FREE, THYROIDAB in the last 72 hours. Anemia Panel: No results for input(s): VITAMINB12, FOLATE, FERRITIN, TIBC, IRON, RETICCTPCT in the last 72 hours. Sepsis Labs:  Recent Labs Lab 06/03/16 0842  LATICACIDVEN 1.15    Recent Results (from the past 240 hour(s))  Blood Culture (routine x 2)     Status: None (Preliminary result)   Collection Time: 06/03/16  8:30 AM  Result Value Ref Range Status   Specimen Description  BLOOD PORTA CATH  Final   Special Requests   Final    BOTTLES DRAWN AEROBIC AND ANAEROBIC Blood Culture adequate volume   Culture   Final    NO GROWTH 1 DAY Performed at [facility], [address], [place], [place] [address]    Report Status PENDING  Incomplete  Blood Culture (routine x 2)     Status: None (Preliminary result)   Collection Time: 06/03/16  8:32 AM  Result Value Ref Range Status   Specimen Description BLOOD RIGHT HAND  Final   Special Requests   Final    BOTTLES DRAWN AEROBIC AND ANAEROBIC Blood Culture adequate volume   Culture   Final    NO GROWTH 1 DAY Performed at [facility], [place] [address], [place], [place] [address]    Report Status PENDING  Incomplete  Culture, Urine     Status: Abnormal   Collection Time: 06/03/16 10:53 AM  Result Value Ref Range Status   Specimen Description URINE, CLEAN CATCH  Final   Special Requests NONE  Final   Culture (A)  Final    <10,000 COLONIES/mL  INSIGNIFICANT GROWTH Performed at [facility], [place] [address], [place], [place] [address]    Report Status 06/04/2016 FINAL  Final         Radiology Studies: Dg Chest 2 View  Result Date: 06/03/2016 CLINICAL DATA:  Fever.  Leukemia. EXAM: CHEST  2 VIEW COMPARISON:  10/10/2015 chest radiograph. FINDINGS: Right subclavian MediPort terminates at the cavoatrial junction. Stable cardiomediastinal silhouette with normal heart size. No pneumothorax. No pleural effusion. No pulmonary edema. Surgical sutures overlie the left upper lung. No acute consolidative airspace disease. IMPRESSION: No active cardiopulmonary disease. Electronically Signed   By: Ilona Sorrel [name]   On: 06/03/2016 09:55        Scheduled Meds: . acyclovir  800 mg Oral BID  . amitriptyline  20 mg Oral QHS  . fluconazole  200 mg Oral BID  . gabapentin  300 mg Oral TID  . insulin aspart  0-5 Units Subcutaneous QHS  . insulin aspart  0-9 Units Subcutaneous TID WC  . magnesium oxide  200 mg  Oral Daily  . mouth rinse  15 mL Mouth Rinse BID  . mometasone-formoterol  2 puff Inhalation BID  . multivitamin with minerals  1 tablet Oral Daily  . mycophenolate  500 mg Oral BID  . polyvinyl alcohol  1 drop Both Eyes TID  . potassium chloride  40 mEq Oral Q4H  . pravastatin  20 mg Oral Daily  . predniSONE  10 mg Oral Q breakfast  . Sirolimus  0.5 mg Oral Daily  . [START ON 06/05/2016] sulfamethoxazole-trimethoprim  1 tablet Oral Once per day on Mon Wed Fri  . triamcinolone cream  1 application Topical Daily   Continuous Infusions: . sodium chloride 75 mL/hr at 06/03/16 1408  . piperacillin-tazobactam (ZOSYN)  IV 3.375 g (06/04/16 1006)  . vancomycin 750 mg (06/04/16 0902)     LOS: 1 day     Cordelia Poche, MD [facility] 06/04/2016, 12:53 PM Pager: [phone]  If 7PM-7AM, please contact night-coverage [email or web address] Password [email or web address] 06/04/2016, 12:53 PM

## 2016-06-05 LAB — BASIC METABOLIC PANEL
ANION GAP: 7 (ref 5–15)
BUN: 11 mg/dL (ref 6–20)
CALCIUM: 8.2 mg/dL — AB (ref 8.9–10.3)
CO2: 25 mmol/L (ref 22–32)
Chloride: 105 mmol/L (ref 101–111)
Creatinine, Ser: 1.25 mg/dL — ABNORMAL HIGH (ref 0.44–1.00)
GFR, EST AFRICAN AMERICAN: 57 mL/min — AB (ref >60)
GFR, EST NON AFRICAN AMERICAN: 49 mL/min — AB (ref >60)
GLUCOSE: 118 mg/dL — AB (ref 65–99)
Potassium: 3.5 mmol/L (ref 3.5–5.1)
SODIUM: 137 mmol/L (ref 135–145)

## 2016-06-05 LAB — CBC
HCT: 27.4 % — ABNORMAL LOW (ref 36.0–46.0)
HEMOGLOBIN: 8.9 g/dL — AB (ref 12.0–15.0)
MCH: 29.4 pg (ref 26.0–34.0)
MCHC: 32.5 g/dL (ref 30.0–36.0)
MCV: 90.4 fL (ref 78.0–100.0)
Platelets: 184 10*3/uL (ref 150–400)
RBC: 3.03 MIL/uL — AB (ref 3.87–5.11)
RDW: 14.6 % (ref 11.5–15.5)
WBC: 9.4 10*3/uL (ref 4.0–10.5)

## 2016-06-05 LAB — GLUCOSE, CAPILLARY
GLUCOSE-CAPILLARY: 92 mg/dL (ref 65–99)
GLUCOSE-CAPILLARY: 137 mg/dL — AB (ref 65–99)

## 2016-06-05 LAB — HIV ANTIBODY (ROUTINE TESTING W REFLEX): HIV Screen 4th Generation wRfx: NONREACTIVE

## 2016-06-05 MED ORDER — HEPARIN SOD (PORK) LOCK FLUSH 100 UNIT/ML IV SOLN: 500.0000 [IU] | INTRAVENOUS | Status: DC | PRN

## 2016-06-05 MED ORDER — HEPARIN SOD (PORK) LOCK FLUSH 100 UNIT/ML IV SOLN
500.0000 [IU] | INTRAVENOUS | Status: DC | PRN
  Filled 2016-06-05: qty 5

## 2016-06-05 NOTE — Progress Notes (Signed)
Discharge instructions explained to pt, states she drove to the hospital herself. States she feels well enough to drive home.

## 2016-06-05 NOTE — Discharge Summary (Signed)
Physician Discharge Summary  DANNICA BICKHAM YIR:[id number] DOB: 06/25/1965 DOA: 06/03/2016  PCP: Alroy Dust, L.Marlou Sa, MD  Admit date: 06/03/2016 Discharge date: 06/05/2016  Admitted From: Home Disposition: Home  Recommendations for Outpatient Follow-up:  1. Follow up with PCP in 1 week 2. Follow up with Dr. Harvel Ricks in 1-2 weeks 3. Please obtain BMP/CBC in one week to recheck hemoglobin 4. Please follow up on the following pending results: Blood culture  Home Health: None Equipment/Devices: None  Discharge Condition: Stable CODE STATUS: Full code Diet recommendation: Heart healthy   Brief/Interim Summary:  Admission HPI written by Caren Griffins, MD   Chief Complaint: Fever  HPI: Lynn [name] is a 51 y.o. female with medical history significant of ALL currently in remission status post autologous stem cell transplant, with posttransplant complications with chronic graft-versus-host disease, also history of pulmonary embolism as well as intracranial hemorrhage s/p external ventricular drain placement, also subdural hematoma s/p suboccipital craniotomy for decompression in 2013, currently having an IVC filter in place, on chronic immunosuppression, presents to the emergency room with a chief complaint of fever on and off for the past 1 week.  She has been having intermittent fevers at home up to 101-102, for which she has been taking Tylenol.  She has been having associated intermittent left-sided and right-sided flank pain, which reminds her of her previous urinary tract infections, as well as has been complaining of mild dysuria which comes and goes over the last week.  She also been having intermittent diarrheal episodes which have now resolved.  C. difficile was checked about a month ago and it returned negative.  She has also been having intermittent left lower extremity swelling, and she had an ultrasound done last month which was negative for DVT as well.  She currently has no chest  pain, no shortness of breath, no cough or chest congestion.  She denies any abdominal pain, has mild nausea however no vomiting.  No lightheadedness or dizziness.  She tells me she had a fever of 102 this morning for which she took 1 g of Tylenol just prior to coming to the emergency room  ED Course: In the emergency room she is afebrile, initially she was to be tachycardic with heart rate of 120 however this improved with fluids.  Her blood pressure is stable and she is satting 100% on room air.  Her blood work is pertinent for a potassium of 2.9, creatinine is 1.6.  She has mild leukocytosis with a white count of 11.9.  Lactic acid is normal.  Chest x-ray was negative for acute findings a urinalysis is negative.  Blood cultures were obtained from periphery as well as her port, and TRH is asked for admission for fever in an immunosuppressed patient.    Hospital course:  Febrile illness History does not suggest a source for infection. Influenza was negative. No evidence of pneumonia, intra-abdominal process, UTI. Blood cultures sent and have been no growth to date. Urine culture significant for insignificant growth. Vancomycin and zosyn stopped. Afebrile while on antibiotics. Possibly an viral process. No symptoms to pinpoint source as stated earlier. Discussed with hematologist who agrees to discharge without antibiotics and he will follow-up with her as an outpatient. She has an appointment with him later this month and will be seen sooner if needed.  Acute leukemia Currently in remission. Patient follows up with [facility]. Continued CellCept, prednisone, sirolimus and Imbruvica held secondary to possible acute infection. Restarted home regimen at discharge.  Chronic kidney disease, stage III Stable  Asthma Asymptomatic. Continued Advair  Hyperlipidemia Continued pravastatin  History of intracranial hemorrhage History of subdural hemorrhage Secondary to  thrombocytopenia in 2013  Type 2 diabetes mellitus Sliding scale while inpatient. Gabapentin continued.  Essential hypertension Normotensive Will continue to hold hydrochlorothiazide   Anemia Likely a component of chronic disease. No evi  Discharge Diagnoses:  Active Problems:   Anemia in neoplastic disease   Acute lymphoblastic leukemia in remission [facility])   Essential hypertension   History of peripheral stem cell transplant (HCC)   Subdural hemorrhage (HCC)   Fever   History of pulmonary embolism   Insulin dependent type 2 diabetes mellitus, uncontrolled (HCC)   BOOP (bronchiolitis obliterans with organizing pneumonia) ([place])   Asthma, chronic   S/P allogeneic bone marrow transplant ([place])   Chronic graft-versus-host disease (HCC)   Blurry vision, bilateral   CKD (chronic kidney disease), stage III    Discharge Instructions  Discharge Instructions    Call MD for:  difficulty breathing, headache or visual disturbances    Complete by:  As directed    Call MD for:  persistant dizziness or light-headedness    Complete by:  As directed    Call MD for:  persistant nausea and vomiting    Complete by:  As directed    Call MD for:  severe uncontrolled pain    Complete by:  As directed    Call MD for:  temperature >100.4    Complete by:  As directed      Allergies as of 06/05/2016   No Known Allergies     Medication List    STOP taking these medications   hydrochlorothiazide 25 MG tablet Commonly known as:  HYDRODIURIL     TAKE these medications   ACCU-CHEK AVIVA PLUS test strip Generic drug:  glucose blood USE TO TEST BLOOD GLUCOSE 2 TIMES DAILY   acetaminophen 500 MG tablet Commonly known as:  TYLENOL Take 500 mg by mouth every 6 (six) hours as needed for pain.   acyclovir 800 MG tablet Commonly known as:  ZOVIRAX Take 1 tablet (800 mg total) by mouth 2 (two) times daily. Marland Kitchen   ADVAIR DISKUS 500-50 MCG/DOSE Aepb Generic drug:  Fluticasone-Salmeterol Inhale  1 puff into the lungs daily.   amitriptyline 10 MG tablet Commonly known as:  ELAVIL Take 2 tablets (20 mg total) by mouth at bedtime.   cetirizine 10 MG tablet Commonly known as:  ZYRTEC Take 1 tablet (10 mg total) by mouth daily. What changed:  when to take this  reasons to take this   fluconazole 200 MG tablet Commonly known as:  DIFLUCAN Take 200 mg by mouth 2 (two) times daily.   gabapentin 300 MG capsule Commonly known as:  NEURONTIN TAKE ONE CAPSULE BY MOUTH THREE TIMES DAILY What changed:  See the new instructions.   IMBRUVICA 140 MG tablet Generic drug:  ibrutinib Take 280 mg by mouth daily. Take 2 tablets once daily   KLOR-CON M20 20 MEQ tablet Generic drug:  potassium chloride SA TAKE 1 TABLET (20 MEQ TOTAL) BY MOUTH DAILY.   LORazepam 1 MG tablet Commonly known as:  ATIVAN Take 1 mg by mouth every 4 (four) hours as needed for anxiety.   Magnesium 250 MG Tabs Take 250 mg by mouth daily.   Medical Compression Socks Misc Please dispense one pair.   multivitamin tablet Take 1 tablet by mouth daily.   mycophenolate 250 MG capsule Commonly known as:  CELLCEPT Take 500 mg by mouth 2 (two) times daily.   NOVOFINE PLUS 32G X 4 MM Misc Generic drug:  Insulin Pen Needle   NOVOLOG FLEXPEN 100 UNIT/ML FlexPen Generic drug:  insulin aspart INJECT 16 UNITS WITH EACH MEAL PLUS 18-20 EXTRA UNITS IF SUGAR IS OVER 250   ondansetron 4 MG disintegrating tablet Commonly known as:  ZOFRAN ODT Take 1 tablet (4 mg total) by mouth every 8 (eight) hours as needed for nausea or vomiting.   pravastatin 20 MG tablet Commonly known as:  PRAVACHOL TAKE 1 TABLET (20 MG TOTAL) BY MOUTH DAILY.   predniSONE 5 MG tablet Commonly known as:  DELTASONE Take 10 mg by mouth daily with breakfast.   RAPAMUNE 0.5 MG tablet Generic drug:  Sirolimus Take 0.5 mg by mouth daily.   SitaGLIPtin-MetFORMIN HCl 50-1000 MG Tb24 Commonly known as:  JANUMET XR Take 2 tablets by mouth  daily.   SOOTHE 0.6-0.6 % Soln Generic drug:  Propylene Glycol-Glycerin Place 1 drop into both eyes 3 (three) times daily.   sulfamethoxazole-trimethoprim 800-160 MG tablet Commonly known as:  BACTRIM DS,SEPTRA DS Take 1 tablet by mouth 3 (three) times a week. Monday,Wednesday, and Friday   triamcinolone cream 0.1 % Commonly known as:  KENALOG Apply 1 application topically daily.      Follow-up Information    Alroy Dust, L.Marlou Sa, MD. Schedule an appointment as soon as possible for a visit in 1 week(s).   Specialty:  Family Medicine Contact information: [address] [place] [address] [phone]        Edgar, Glorianne Manchester, DO. Schedule an appointment as soon as possible for a visit in 1 week(s).   Specialty:  Hematology Contact information: [place] [address] [phone]          No Known Allergies  Consultations:  None   Procedures/Studies: Dg Chest 2 View  Result Date: 06/03/2016 CLINICAL DATA:  Fever.  Leukemia. EXAM: CHEST  2 VIEW COMPARISON:  10/10/2015 chest radiograph. FINDINGS: Right subclavian MediPort terminates at the cavoatrial junction. Stable cardiomediastinal silhouette with normal heart size. No pneumothorax. No pleural effusion. No pulmonary edema. Surgical sutures overlie the left upper lung. No acute consolidative airspace disease. IMPRESSION: No active cardiopulmonary disease. Electronically Signed   By: Ilona Sorrel [name]   On: 06/03/2016 09:55   Dg Ribs Unilateral Right  Result Date: 06/04/2016 CLINICAL DATA:  Right rib pain for several months without known injury. EXAM: RIGHT RIBS - 2 VIEW COMPARISON:  Radiographs of Jun 03, 2016. FINDINGS: No fracture or other bone lesions are seen involving the ribs. IMPRESSION: Normal right ribs. Electronically Signed   By: Marijo Conception, [name]   On: 06/04/2016 14:53   Mm Digital Screening Bilateral  Result Date: 05/08/2016 CLINICAL DATA:  Screening. EXAM:  DIGITAL SCREENING BILATERAL MAMMOGRAM WITH CAD COMPARISON:  Previous exam(s). ACR Breast Density Category a: The breast tissue is almost entirely fatty. FINDINGS: There are no findings suspicious for malignancy. Images were processed with CAD. IMPRESSION: No mammographic evidence of malignancy. A result letter of this screening mammogram will be mailed directly to the patient. RECOMMENDATION: Screening mammogram in one year. (Code:SM-B-01Y) BI-RADS CATEGORY  1: Negative. Electronically Signed   By: Margarette Canada [name]   On: 05/08/2016 13:46     Subjective: Patient reports no chest pain, dyspnea, nausea, vomiting, abdominal pain. Afebrile overnight.  Discharge Exam: Vitals:   06/04/16 2100 06/05/16 0532  BP: 106/75 100/62  Pulse: 91 80  Resp: 16 16  Temp: 98.6 F (37 C) 97.9 F (36.6 C)   Vitals:   06/04/16 0633 06/04/16 1337 06/04/16 2100 06/05/16 0532  BP: 114/62 105/63 106/75 100/62  Pulse: 95 93 91 80  Resp: 16 16 16 16   Temp: 100 F (37.8 C) 98.8 F (37.1 C) 98.6 F (37 C) 97.9 F (36.6 C)  TempSrc: Oral Oral Oral Oral  SpO2: 100% 100% 100% 100%  Weight:      Height:        General: Pt is alert, awake, not in acute distress Cardiovascular: RRR, S1/S2 +, no rubs, no gallops Respiratory: CTA bilaterally, no wheezing, no rhonchi Abdominal: Soft, NT, ND, bowel sounds + Extremities: no edema, no cyanosis    The results of significant diagnostics from this hospitalization (including imaging, microbiology, ancillary and laboratory) are listed below for reference.     Microbiology: Recent Results (from the past 240 hour(s))  Blood Culture (routine x 2)     Status: None (Preliminary result)   Collection Time: 06/03/16  8:30 AM  Result Value Ref Range Status   Specimen Description BLOOD PORTA CATH  Final   Special Requests   Final    BOTTLES DRAWN AEROBIC AND ANAEROBIC Blood Culture adequate volume   Culture   Final    NO GROWTH 1 DAY Performed at [facility],  [address], [place], [place] [address]    Report Status PENDING  Incomplete  Blood Culture (routine x 2)     Status: None (Preliminary result)   Collection Time: 06/03/16  8:32 AM  Result Value Ref Range Status   Specimen Description BLOOD RIGHT HAND  Final   Special Requests   Final    BOTTLES DRAWN AEROBIC AND ANAEROBIC Blood Culture adequate volume   Culture   Final    NO GROWTH 1 DAY Performed at [facility], [place] [address], [place], [place] [address]    Report Status PENDING  Incomplete  Culture, Urine     Status: Abnormal   Collection Time: 06/03/16 10:53 AM  Result Value Ref Range Status   Specimen Description URINE, CLEAN CATCH  Final   Special Requests NONE  Final   Culture (A)  Final    <10,000 COLONIES/mL INSIGNIFICANT GROWTH Performed at [facility], [address], [place], [place] [address]    Report Status 06/04/2016 FINAL  Final     Labs: BNP (last 3 results) No results for input(s): BNP in the last 8760 hours. Basic Metabolic Panel:  Recent Labs Lab 06/03/16 0832 06/04/16 0500 06/05/16 0445  NA 134* 136 137  K 2.9* 3.1* 3.5  CL 97* 103 105  CO2 26 25 25   GLUCOSE 155* 118* 118*  BUN 15 11 11   CREATININE 1.64* 1.34* 1.25*  CALCIUM 8.8* 8.2* 8.2*   Liver Function Tests:  Recent Labs Lab 06/03/16 0832 06/04/16 0500  AST 42* 45*  ALT 30 28  ALKPHOS 143* 118  BILITOT 0.5 0.4  PROT 7.3 6.2*  ALBUMIN 3.2* 2.6*   No results for input(s): LIPASE, AMYLASE in the last 168 hours. No results for input(s): AMMONIA in the last 168 hours. CBC:  Recent Labs Lab 06/03/16 0832 06/04/16 0500 06/05/16 0445  WBC 11.9* 9.5 9.4  NEUTROABS 6.7  --   --   HGB 11.2* 9.6* 8.9*  HCT 34.3* 29.9* 27.4*  MCV 89.6 90.6 90.4  PLT 235 185 184   Cardiac Enzymes: No results for input(s): CKTOTAL, CKMB,  CKMBINDEX, TROPONINI in the last 168 hours. BNP: Invalid input(s): POCBNP CBG:  Recent Labs Lab 06/04/16 1251 06/04/16 1706 06/04/16 2045  06/05/16 0817 06/05/16 1155  GLUCAP 211* 157* 142* 92 137*   D-Dimer No results for input(s): DDIMER in the last 72 hours. Hgb A1c No results for input(s): HGBA1C in the last 72 hours. Lipid Profile No results for input(s): CHOL, HDL, LDLCALC, TRIG, CHOLHDL, LDLDIRECT in the last 72 hours. Thyroid function studies No results for input(s): TSH, T4TOTAL, T3FREE, THYROIDAB in the last 72 hours.  Invalid input(s): FREET3 Anemia work up No results for input(s): VITAMINB12, FOLATE, FERRITIN, TIBC, IRON, RETICCTPCT in the last 72 hours. Urinalysis    Component Value Date/Time   COLORURINE YELLOW 06/03/2016 1048   APPEARANCEUR CLEAR 06/03/2016 1048   LABSPEC 1.008 06/03/2016 1048   LABSPEC 1.025 10/07/2015 1039   PHURINE 5.0 06/03/2016 1048   GLUCOSEU NEGATIVE 06/03/2016 1048   GLUCOSEU Negative 10/07/2015 1039   HGBUR SMALL (A) 06/03/2016 1048   BILIRUBINUR NEGATIVE 06/03/2016 1048   BILIRUBINUR Color Interference 10/07/2015 1039   KETONESUR NEGATIVE 06/03/2016 1048   PROTEINUR NEGATIVE 06/03/2016 1048   UROBILINOGEN 0.2 10/07/2015 1039   NITRITE NEGATIVE 06/03/2016 1048   LEUKOCYTESUR NEGATIVE 06/03/2016 1048   LEUKOCYTESUR Moderate 10/07/2015 1039   Sepsis Labs Invalid input(s): PROCALCITONIN,  WBC,  LACTICIDVEN Microbiology Recent Results (from the past 240 hour(s))  Blood Culture (routine x 2)     Status: None (Preliminary result)   Collection Time: 06/03/16  8:30 AM  Result Value Ref Range Status   Specimen Description BLOOD PORTA CATH  Final   Special Requests   Final    BOTTLES DRAWN AEROBIC AND ANAEROBIC Blood Culture adequate volume   Culture   Final    NO GROWTH 1 DAY Performed at [facility], [place] [address], [place], [place] [address]    Report Status PENDING  Incomplete  Blood Culture (routine x 2)     Status: None (Preliminary result)   Collection Time: 06/03/16  8:32 AM  Result Value Ref Range Status   Specimen Description BLOOD RIGHT HAND  Final    Special Requests   Final    BOTTLES DRAWN AEROBIC AND ANAEROBIC Blood Culture adequate volume   Culture   Final    NO GROWTH 1 DAY Performed at [facility], [place] [address], [place], [place] [address]    Report Status PENDING  Incomplete  Culture, Urine     Status: Abnormal   Collection Time: 06/03/16 10:53 AM  Result Value Ref Range Status   Specimen Description URINE, CLEAN CATCH  Final   Special Requests NONE  Final   Culture (A)  Final    <10,000 COLONIES/mL INSIGNIFICANT GROWTH Performed at [facility], [address] [place], [place] [address]    Report Status 06/04/2016 FINAL  Final     Time coordinating discharge: Over 30 minutes  SIGNED:   Cordelia Poche, MD [facility] 06/05/2016, 12:13 PM Pager [phone]  If 7PM-7AM, please contact night-coverage [email or web address] Password [email or web address]

## 2016-06-05 NOTE — Discharge Instructions (Signed)
Lynn [name],  You were admitted because of your fever with concern for an underlying infection since you are immunocompromised. Your tests so far are negative for any source of infection. You were on broad spectrum antibiotics and these were discontinued before your discharge. I discussed things with your Hematologist and he recommends no antibiotics on discharge and to resume your current medication regimen. I have recommended to discontinue your hydrochlorothiazide, however, because your blood pressures have been well controlled without it while you were in the hospital.

## 2016-06-08 LAB — CULTURE, BLOOD (ROUTINE X 2)
Culture: NO GROWTH
Culture: NO GROWTH
SPECIAL REQUESTS: ADEQUATE
Special Requests: ADEQUATE

## 2016-06-16 DIAGNOSIS — R509 Fever, unspecified: Secondary | ICD-10-CM | POA: Diagnosis not present

## 2016-06-20 DIAGNOSIS — R197 Diarrhea, unspecified: Secondary | ICD-10-CM | POA: Diagnosis not present

## 2016-06-20 DIAGNOSIS — Z9484 Stem cells transplant status: Secondary | ICD-10-CM | POA: Diagnosis not present

## 2016-06-20 DIAGNOSIS — D89813 Graft-versus-host disease, unspecified: Secondary | ICD-10-CM | POA: Diagnosis not present

## 2016-06-20 DIAGNOSIS — R739 Hyperglycemia, unspecified: Secondary | ICD-10-CM | POA: Diagnosis not present

## 2016-06-20 DIAGNOSIS — L989 Disorder of the skin and subcutaneous tissue, unspecified: Secondary | ICD-10-CM | POA: Diagnosis not present

## 2016-06-20 DIAGNOSIS — R509 Fever, unspecified: Secondary | ICD-10-CM | POA: Diagnosis not present

## 2016-06-20 DIAGNOSIS — D899 Disorder involving the immune mechanism, unspecified: Secondary | ICD-10-CM | POA: Diagnosis not present

## 2016-06-20 DIAGNOSIS — T380X5A Adverse effect of glucocorticoids and synthetic analogues, initial encounter: Secondary | ICD-10-CM | POA: Diagnosis not present

## 2016-06-20 DIAGNOSIS — Z86711 Personal history of pulmonary embolism: Secondary | ICD-10-CM | POA: Diagnosis not present

## 2016-06-20 DIAGNOSIS — R0689 Other abnormalities of breathing: Secondary | ICD-10-CM | POA: Diagnosis not present

## 2016-06-20 DIAGNOSIS — C9101 Acute lymphoblastic leukemia, in remission: Secondary | ICD-10-CM | POA: Diagnosis not present

## 2016-06-20 DIAGNOSIS — J42 Unspecified chronic bronchitis: Secondary | ICD-10-CM | POA: Diagnosis not present

## 2016-06-20 DIAGNOSIS — Z79899 Other long term (current) drug therapy: Secondary | ICD-10-CM | POA: Diagnosis not present

## 2016-06-20 DIAGNOSIS — J84116 Cryptogenic organizing pneumonia: Secondary | ICD-10-CM | POA: Diagnosis not present

## 2016-06-20 DIAGNOSIS — D801 Nonfamilial hypogammaglobulinemia: Secondary | ICD-10-CM | POA: Diagnosis not present

## 2016-06-22 DIAGNOSIS — L982 Febrile neutrophilic dermatosis [Sweet]: Secondary | ICD-10-CM | POA: Diagnosis not present

## 2016-06-22 DIAGNOSIS — R229 Localized swelling, mass and lump, unspecified: Secondary | ICD-10-CM | POA: Diagnosis not present

## 2016-06-22 DIAGNOSIS — D489 Neoplasm of uncertain behavior, unspecified: Secondary | ICD-10-CM | POA: Diagnosis not present

## 2016-06-30 ENCOUNTER — Other Ambulatory Visit: Payer: Medicare Other

## 2016-06-30 ENCOUNTER — Other Ambulatory Visit (INDEPENDENT_AMBULATORY_CARE_PROVIDER_SITE_OTHER): Payer: Medicare Other

## 2016-06-30 DIAGNOSIS — E1165 Type 2 diabetes mellitus with hyperglycemia: Secondary | ICD-10-CM

## 2016-06-30 LAB — LIPID PANEL
Cholesterol: 193 mg/dL (ref 0–200)
HDL: 48.3 mg/dL (ref >39.00)
NONHDL: 144.23
TRIGLYCERIDES: 273 mg/dL — AB (ref 0.0–149.0)
Total CHOL/HDL Ratio: 4
VLDL: 54.6 mg/dL — ABNORMAL HIGH (ref 0.0–40.0)

## 2016-06-30 LAB — COMPREHENSIVE METABOLIC PANEL
ALBUMIN: 3.5 g/dL (ref 3.5–5.2)
ALK PHOS: 125 U/L — AB (ref 39–117)
ALT: 19 U/L (ref 0–35)
AST: 20 U/L (ref 0–37)
BILIRUBIN TOTAL: 0.2 mg/dL (ref 0.2–1.2)
BUN: 19 mg/dL (ref 6–23)
CO2: 25 mEq/L (ref 19–32)
Calcium: 9.1 mg/dL (ref 8.4–10.5)
Chloride: 106 mEq/L (ref 96–112)
Creatinine, Ser: 1.4 mg/dL — ABNORMAL HIGH (ref 0.40–1.20)
GFR: 50.82 mL/min — AB (ref >60.00)
GLUCOSE: 90 mg/dL (ref 70–99)
POTASSIUM: 4.2 meq/L (ref 3.5–5.1)
SODIUM: 139 meq/L (ref 135–145)
TOTAL PROTEIN: 6.7 g/dL (ref 6.0–8.3)

## 2016-06-30 LAB — LDL CHOLESTEROL, DIRECT: LDL DIRECT: 102 mg/dL

## 2016-07-01 LAB — FRUCTOSAMINE: Fructosamine: 210 umol/L (ref 0–285)

## 2016-07-03 ENCOUNTER — Ambulatory Visit (INDEPENDENT_AMBULATORY_CARE_PROVIDER_SITE_OTHER): Payer: Medicare Other | Admitting: Endocrinology

## 2016-07-03 ENCOUNTER — Encounter: Payer: Self-pay | Admitting: Endocrinology

## 2016-07-03 VITALS — BP 122/86 | HR 86 | Ht 67.0 in | Wt 220.6 lb

## 2016-07-03 DIAGNOSIS — E1165 Type 2 diabetes mellitus with hyperglycemia: Secondary | ICD-10-CM

## 2016-07-03 NOTE — Patient Instructions (Addendum)
Prednisone 5mg  alt with 7.5mg  till end of month then 5mg  daily  10mg  if having fever

## 2016-07-03 NOTE — Progress Notes (Signed)
Patient ID: Lynn [name], female   DOB: 05/23/1965, 51 y.o.   MRN: [id number]    Reason for Appointment: Endocrinology follow-up  Referring physician: Donnie Coffin  History of Present Illness:          DIABETES: Diagnosis: Type 2 diabetes mellitus, date of diagnosis: 2013        Past history: She weighed nearly 300 lb in 2013 around that time her diabetes was diagnosed Although her A1c at that time was reported at 7.4 she does not remember being told about diabetes and no treatment was given In 5/15 when she was getting steroids for her bone marrow transplant her blood sugar was over 400 She was started on insulin and had been taking a basal bolus insulin regimen subsequently while on prednisone Although she required small amounts of insulin she could not taper off insulin even when off prednisone  Recent history:    Currently she is taking 2 tablet of Janumet XR without side effects    INSULIN regimen is NovoLog prn, none recently  She is now on Prednisone 7.5mg   A1c in April was 6.9, previously was higher at 7.3 Fructosamine is excellent at 210  Current management, blood sugar patterns and problems:  Her blood sugars are being checked very sporadically again  Fasting readings are fairly close to normal  She has a couple of readings after breakfast and early afternoon but not clear how long after eating, these are excellent  Last month she had a couple of good readings after supper, highest 131  Recently has had less problems with her infections and has been able to reduce her prednisone also  She is trying to be more active some walking and exercise in the pool       Oral hypoglycemic drugs the patient is taking are: Janumet XR once a day    Side effects from medications have been: Diarrhea from Metformin over 1000 mg dose  Glucose monitoring:  done less than 1 time a day         Glucometer:  Accu-Chek   Readings as above, overall AVERAGE 103 with a  range of 75-1 31  Glycemic control:   Lab Results  Component Value Date   HGBA1C 6.9 (H) 05/08/2016   HGBA1C 7.3 (H) 02/10/2016   HGBA1C 6.3 11/10/2015   Lab Results  Component Value Date   MICROALBUR 1.3 05/08/2016   LDLCALC 85 12/14/2014   CREATININE 1.40 (H) 06/30/2016    Self-care: The diet that the patient has been following is: tries to limit meal size  Meals: 2 meals per day.  with breakfast at 11 am. Supper 5 pm   Exercise: walking a little, in pool Dietician visit: Most recent: 5/15 at the hospital              Compliance with the medical regimen: Fair  Weight history:  Wt Readings from Last 3 Encounters:  07/03/16 220 lb 9.6 oz (100.1 kg)  06/03/16 219 lb 8 oz (99.6 kg)  05/11/16 217 lb (98.4 kg)    PROBLEM 2:   She is asking with help with steroid taper. She is taking this for GVHD and also for her pulmonary condition  She was told To reduce the dose to 7.5 mg once her fever and infection as resolved and she has benefited to do this for the last 2 weeks without any weakness or fatigue Electrolytes normal    Appointment on 06/30/2016  Component Date Value Ref Range  Status  . Fructosamine 06/30/2016 210  0 - 285 umol/L Final   Comment: Published reference interval for apparently healthy subjects between age 7 and 56 is 90 - 285 umol/L and in a poorly controlled diabetic population is 228 - 563 umol/L with a mean of 396 umol/L.   Marland Kitchen Cholesterol 06/30/2016 193  0 - 200 mg/dL Final   ATP III Classification       Desirable:  < 200 mg/dL               Borderline High:  200 - 239 mg/dL          High:  > = 240 mg/dL  . Triglycerides 06/30/2016 273.0* 0.0 - 149.0 mg/dL Final   Normal:  <150 mg/dLBorderline High:  150 - 199 mg/dL  . HDL 06/30/2016 48.30  >39.00 mg/dL Final  . VLDL 06/30/2016 54.6* 0.0 - 40.0 mg/dL Final  . Total CHOL/HDL Ratio 06/30/2016 4   Final                  Men          Women1/2 Average Risk     3.4          3.3Average Risk           5.0          4.42X Average Risk          9.6          7.13X Average Risk          15.0          11.0                      . NonHDL 06/30/2016 144.23   Final   NOTE:  Non-HDL goal should be 30 mg/dL higher than patient's LDL goal (i.e. LDL goal of < 70 mg/dL, would have non-HDL goal of < 100 mg/dL)  . Sodium 06/30/2016 139  135 - 145 mEq/L Final  . Potassium 06/30/2016 4.2  3.5 - 5.1 mEq/L Final  . Chloride 06/30/2016 106  96 - 112 mEq/L Final  . CO2 06/30/2016 25  19 - 32 mEq/L Final  . Glucose, Bld 06/30/2016 90  70 - 99 mg/dL Final  . BUN 06/30/2016 19  6 - 23 mg/dL Final  . Creatinine, Ser 06/30/2016 1.40* 0.40 - 1.20 mg/dL Final  . Total Bilirubin 06/30/2016 0.2  0.2 - 1.2 mg/dL Final  . Alkaline Phosphatase 06/30/2016 125* 39 - 117 U/L Final  . AST 06/30/2016 20  0 - 37 U/L Final  . ALT 06/30/2016 19  0 - 35 U/L Final  . Total Protein 06/30/2016 6.7  6.0 - 8.3 g/dL Final  . Albumin 06/30/2016 3.5  3.5 - 5.2 g/dL Final  . Calcium 06/30/2016 9.1  8.4 - 10.5 mg/dL Final  . GFR 06/30/2016 50.82* >60.00 mL/min Final  . Direct LDL 06/30/2016 102.0  mg/dL Final   Optimal:  <100 mg/dLNear or Above Optimal:  100-129 mg/dLBorderline High:  130-159 mg/dLHigh:  160-189 mg/dLVery High:  >190 mg/dL    Allergies as of 07/03/2016      Reactions   Tegaderm Ag Mesh [silver] Rash      Medication List       Accurate as of 07/03/16  9:24 PM. Always use your most recent med list.          ACCU-CHEK AVIVA PLUS test strip Generic drug:  glucose blood USE TO TEST  BLOOD GLUCOSE 2 TIMES DAILY   acetaminophen 500 MG tablet Commonly known as:  TYLENOL Take 500 mg by mouth every 6 (six) hours as needed for pain.   acyclovir 800 MG tablet Commonly known as:  ZOVIRAX Take 1 tablet (800 mg total) by mouth 2 (two) times daily. Marland Kitchen   ADVAIR DISKUS 500-50 MCG/DOSE Aepb Generic drug:  Fluticasone-Salmeterol Inhale 1 puff into the lungs daily.   amitriptyline 10 MG tablet Commonly known as:   ELAVIL Take 2 tablets (20 mg total) by mouth at bedtime.   cetirizine 10 MG tablet Commonly known as:  ZYRTEC Take 1 tablet (10 mg total) by mouth daily.   fluconazole 200 MG tablet Commonly known as:  DIFLUCAN Take 200 mg by mouth 2 (two) times daily.   gabapentin 300 MG capsule Commonly known as:  NEURONTIN TAKE ONE CAPSULE BY MOUTH THREE TIMES DAILY   IMBRUVICA 140 MG tablet Generic drug:  ibrutinib Take 280 mg by mouth daily. Take 2 tablets once daily   KLOR-CON M20 20 MEQ tablet Generic drug:  potassium chloride SA TAKE 1 TABLET (20 MEQ TOTAL) BY MOUTH DAILY.   LORazepam 1 MG tablet Commonly known as:  ATIVAN Take 1 mg by mouth every 4 (four) hours as needed for anxiety.   Magnesium 250 MG Tabs Take 250 mg by mouth daily.   Medical Compression Socks Misc Please dispense one pair.   multivitamin tablet Take 1 tablet by mouth daily.   mycophenolate 250 MG capsule Commonly known as:  CELLCEPT Take 500 mg by mouth 2 (two) times daily.   NOVOFINE PLUS 32G X 4 MM Misc Generic drug:  Insulin Pen Needle   NOVOLOG FLEXPEN 100 UNIT/ML FlexPen Generic drug:  insulin aspart INJECT 16 UNITS WITH EACH MEAL PLUS 18-20 EXTRA UNITS IF SUGAR IS OVER 250   ondansetron 4 MG disintegrating tablet Commonly known as:  ZOFRAN ODT Take 1 tablet (4 mg total) by mouth every 8 (eight) hours as needed for nausea or vomiting.   pravastatin 20 MG tablet Commonly known as:  PRAVACHOL TAKE 1 TABLET (20 MG TOTAL) BY MOUTH DAILY.   predniSONE 5 MG tablet Commonly known as:  DELTASONE Take 10 mg by mouth daily with breakfast.   RAPAMUNE 0.5 MG tablet Generic drug:  Sirolimus Take 0.5 mg by mouth daily.   SitaGLIPtin-MetFORMIN HCl 50-1000 MG Tb24 Commonly known as:  JANUMET XR Take 2 tablets by mouth daily.   sulfamethoxazole-trimethoprim 800-160 MG tablet Commonly known as:  BACTRIM DS,SEPTRA DS Take 1 tablet by mouth 3 (three) times a week. Monday,Wednesday, and Friday    triamcinolone cream 0.1 % Commonly known as:  KENALOG Apply 1 application topically daily.       Allergies:  Allergies  Allergen Reactions  . Tegaderm Ag Mesh [Silver] Rash    Past Medical History:  Diagnosis Date  . ALL (acute lymphoblastic leukemia) ([place])   . Anemia   . Arthritis   . Brain bleed ([place])    07/04/11  . Diabetes mellitus without complication ([place])   . Headache(784.0)   . Hypertension   . Leukemia ([place])   . Pneumonia   . Pulmonary embolism ([place])    06/22/11  . Thrush of mouth and esophagus ([place]) 04/09/2014    Past Surgical History:  Procedure Laterality Date  . BONE MARROW TRANSPLANT    . CESAREAN SECTION  2000  . CRANIOTOMY  07/04/11  . EYE SURGERY Right 2005   repair crossed eye  . FOOT SURGERY    .  greenfield filter    . LUNG BIOPSY  06/19/13    Family History  Problem Relation Age of Onset  . Pancreatic cancer Father   . Cancer Mother        colon  . Pancreatic cancer Mother   . Asthma Son   . Hypertension Sister   . Diabetes Neg Hx   . Heart disease Neg Hx   . Breast cancer Neg Hx     Social History:  reports that she has never smoked. She has never used smokeless tobacco. She reports that she does not drink alcohol or use drugs.    Review of Systems         Lipids: She tends to have high triglycerides and mildly increased LDL which is improved, Last below 100 She was started on pravastatin 20 mg daily in 9/16      Lab Results  Component Value Date   CHOL 193 06/30/2016   HDL 48.30 06/30/2016   LDLCALC 85 12/14/2014   LDLDIRECT 102.0 06/30/2016   TRIG 273.0 (H) 06/30/2016   CHOLHDL 4 06/30/2016    She is  taking HCTZ for hypertension, this is being prescribed by her oncologist   Has mild Decrease in renal function   Lab Results  Component Value Date   CREATININE 1.40 (H) 06/30/2016   BUN 19 06/30/2016   NA 139 06/30/2016   K 4.2 06/30/2016   CL 106 06/30/2016   CO2 25 06/30/2016     She is being followed at [facility] for her various problems including transplant rejection  Her liver functions have been normal      Thyroid:   She had a thyroid biopsy in 6/14 for a left-sided thyroid nodule which was benign      Physical Examination:  BP 122/86   Pulse 86   Ht 5\' 7"  (1.702 m)   Wt 220 lb 9.6 oz (100.1 kg)   LMP 08/09/2011   SpO2 95%   BMI 34.55 kg/m    ASSESSMENT:  Diabetes type 2, uncontrolled  See history of present illness for detailed discussion of  current management, blood sugar patterns and problems identified  Her fructosamine is 210 indicating good control as discussed above Home readings are fairly good but needs more readings after meals  STEROID dependency:  She has been on long-term steroids  She has been tapered  from 10 mg down to 7.5 mg about a couple of weeks ago and is doing well with this Most likely within start reducing the dose further, gradually to avoid any addisonian-like symptoms  Likely she needs to use stress doses of steroids because of her adrenal suppression  LIPIDS: LDL is borderline at 102, improved, continue pravastatin consider increasing the dose to 40 mg if consistently high   PLAN:  We will continue her Janumet alone for diabetes Reminded her to check that sugars more often especially after meals She will continue to work on weight loss  Prednisone dose: She can go down to 5 mg alternating with 7.5 mg until the end of the month and then 5 mg daily   Patient Instructions  Prednisone 5mg  alt with 7.5mg  till end of month then 5mg  daily  10mg  if having fever   , 07/03/2016, 9:24 PM   Note: This office note was prepared with Estate agent. Any transcriptional errors that result from this process are unintentional.

## 2016-07-18 ENCOUNTER — Ambulatory Visit (INDEPENDENT_AMBULATORY_CARE_PROVIDER_SITE_OTHER): Payer: Medicare Other | Admitting: Nurse Practitioner

## 2016-07-18 ENCOUNTER — Encounter: Payer: Self-pay | Admitting: Nurse Practitioner

## 2016-07-18 VITALS — BP 133/94 | HR 102 | Ht 67.0 in | Wt 223.0 lb

## 2016-07-18 DIAGNOSIS — R51 Headache: Secondary | ICD-10-CM | POA: Diagnosis not present

## 2016-07-18 DIAGNOSIS — R519 Headache, unspecified: Secondary | ICD-10-CM

## 2016-07-18 MED ORDER — AMITRIPTYLINE HCL 10 MG PO TABS: 20.0000 mg | ORAL_TABLET | Freq: Every day | ORAL | 1 refills | Status: DC

## 2016-07-18 NOTE — Progress Notes (Signed)
[facility]  PATIENT: Lynn [name] DOB: [birth year]   REASON FOR VISIT: Follow-up for headache, HISTORY FROM: Patient    HISTORY OF PRESENT ILLNESS:UPDATE 06/19/2018CM Lynn [name], 51 year old female returns for follow-up with a history of headaches. She is currently well-controlled L and Imitrex acutely she takes gabapentin at least nightly sometimes twice a day. She has had more headaches since the end of May however they subside with coffee or Excedrin. She continues to follow at [facility] since her bone marrow transplant. She returns for reevaluation  UPDATE 10/17/2017CM Lynn [name], 51 year old female returns for follow-up. She has a history of headaches which have been fairly well controlled on her Depakote was discontinued at her last visit. She remains on Elavil and Imitrex acutely. She describes her headaches as manageable. She has a history of bone marrow  transplant 2, last in 2015. On her follow-up visit last week to [facility], her magnesium level was low and she was dehydrated so she received IV fluids. She returns for reevaluation    05/06/2015.CM Lynn [name] 51 year old female returns for follow-up. She was last in the office 02/04/2015 by Dr. Jaynee Eagles. At that time she was having worsening of her headaches, more problems with her vision, more difficulty with photosensitivity. She was placed on Depakote in addition to her amitriptyline but she is only taking the Depakote prn. Imitrex continues to work acutely for her. Her prior symptoms are much better, her vision is better without blurring and she describes her headaches as very mild and tolerable. MRI of the brain 02/11/2015 without acute findings and no change from MRI dated March 2014.She is not aware of any foods that are specific for migraine trigger for her.She returns for reevaluation.previous records reviewed     Lynn [name] is a 51 y.o. female here as a follow up. She is a former patient of  Dr. Leonie Man and is transitioning to me. She has chronic daily headaches since June 2013 following intracerebral hemorrhage and treatment with chemotherapy for leukemia. She is still on Amitriptyline which works well. She is having shooting pain on the left(points to the occipital area). Happens a few days a week. Sharp, brief, throbbing, lightning bolt. It doesn't happen all day. It is a few times a week. Sesitive to the touch. She has tenderness a few hours a day in another spot where the drain was, feels like pressure, sore and tender to the touch on the right frontal area behind the hairline. The pain started this winter. 3-4 times a week. She has taken ibuprofen and it doesn't really help.   Interval history 02/04/2015: Headaches. She is having at 4 a week. They can last at least several hours . Lorazepam helps. Starts on the left, a dull pain, then spreads to the front, has nausea, no vomiting, here eyes are really dry and this is exacerbating the headaches, she has been to ophthalmology for her eyes. She is on restasis drops. Light is bothering her since her eyes are dry. She was doing well but within the last few monthe the headaches have returned. She prefers to try a new medication instead of increasing Amitriptyline. She is feeling Depressed and having difficulty sleeping due to her medical conditions. She just labs completed and liver function was normal but creatinine is still elevated. Worse with laying down.   Dr. Leonie Man and Charlott Holler 09/15/2013: She returns for followup after her initial consultation with me on 04/09/12. She did not tolerate Topamax as it did  not help and hence stopped it after a few weeks. She in fact develop worsening headache and was hospitalized at [facility] with headache and fever he had Topamax was discontinued and she was started on amitriptyline 10 mg at night. She was also given Phenergan for nausea seems to be working quite well. She also states that the chemotherapy dose  has been reduced and that may have helped as the headaches have practically disappeared. She hasn't had headache only one day in the last 4 weeks. She did discontinue the Imitrex , oxycodone and Fioricet. 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 Lab work done on 04/09/12 showed normal ESR, ANA panel, B12, blood chemistries. TSH was slightly suppressed at 0.489miu/ml.   Ms. HCuretreturns for headache revisit, states her headaches have been manageable, except she had one severe headache in July with nausea, vomiting and diarrhea, tested positive for cdiff. Feeling better now. Tolerating Elavil at night. Has new complaint os tinging in medial thighs when she puts chin to chest. No associated pain or numbness.  i  REVIEW OF SYSTEMS: Full 14 system review of systems performed and notable only for those listed, all others are neg:  Constitutional: fatigue  Cardiovascular: Neg Ear/Nose/Throat: neg  Skin: neg Eyes: Light sensitivity Respiratory: neg Gastroitestinal: neg  Hematology/Lymphatic: Anemia  Endocrine: neg Musculoskeletal:neg Allergy/Immunology: neg Neurological: history of chronic headaches Psychiatric: Depression and anxiety Sleep : neg   ALLERGIES: Allergies  Allergen Reactions  . Tegaderm Ag Mesh [Silver] Rash    HOME MEDICATIONS: Outpatient Medications Prior to Visit  Medication Sig Dispense Refill  . ACCU-CHEK AVIVA PLUS test strip USE TO TEST BLOOD GLUCOSE 2 TIMES DAILY  5  . acetaminophen (TYLENOL) 500 MG tablet Take 500 mg by mouth every 6 (six) hours as needed for pain.    .Marland Kitchenacyclovir (ZOVIRAX) 800 MG tablet Take 1 tablet (800 mg total) by mouth 2 (two) times daily. . 60 tablet 5  . ADVAIR DISKUS 500-50 MCG/DOSE AEPB Inhale 1 puff into the lungs daily.    .Marland Kitchenamitriptyline (ELAVIL) 10 MG tablet Take 2 tablets (20 mg  total) by mouth at bedtime. 180 tablet 8  . cetirizine (ZYRTEC) 10 MG tablet Take 1 tablet (10 mg total) by mouth daily. (Patient taking differently: Take 10 mg by mouth daily as needed for allergies. ) 30 tablet 0  . Elastic Bandages & Supports (MEDICAL COMPRESSION SOCKS) MISC Please dispense one pair. 2 each 0  . fluconazole (DIFLUCAN) 200 MG tablet Take 200 mg by mouth 2 (two) times daily.     .Marland Kitchengabapentin (NEURONTIN) 300 MG capsule TAKE ONE CAPSULE BY MOUTH THREE TIMES DAILY (Patient taking differently: TAKE ONE CAPSULE BY MOUTH QHS) 90 capsule 4  . IMBRUVICA 140 MG capsul Take 280 mg by mouth daily. Take 2 tablets once daily    . KLOR-CON M20 20 MEQ tablet TAKE 1 TABLET (20 MEQ TOTAL) BY MOUTH DAILY. 30 tablet 2  . LORazepam (ATIVAN) 1 MG tablet Take 1 mg by mouth every 4 (four) hours as needed for anxiety.     . Magnesium 250 MG TABS Take 250 mg by mouth daily.    . Multiple Vitamin (MULTIVITAMIN) tablet Take 1 tablet by mouth daily.    . mycophenolate (CELLCEPT) 250 MG capsule Take 500 mg by mouth 2 (two) times daily.    .Marland Kitchen  NOVOFINE PLUS 32G X 4 MM MISC     . NOVOLOG FLEXPEN 100 UNIT/ML FlexPen INJECT 16 UNITS WITH EACH MEAL PLUS 18-20 EXTRA UNITS IF SUGAR IS OVER 250 30 mL 3  . ondansetron (ZOFRAN ODT) 4 MG disintegrating tablet Take 1 tablet (4 mg total) by mouth every 8 (eight) hours as needed for nausea or vomiting. 20 tablet 0  . pravastatin (PRAVACHOL) 20 MG tablet TAKE 1 TABLET (20 MG TOTAL) BY MOUTH DAILY. 90 tablet 0  . predniSONE (DELTASONE) 5 MG tablet Take 10 mg by mouth daily with breakfast.     . SitaGLIPtin-MetFORMIN HCl (JANUMET XR) 50-1000 MG TB24 Take 2 tablets by mouth daily. 180 tablet 3  . sulfamethoxazole-trimethoprim (BACTRIM DS,SEPTRA DS) 800-160 MG per tablet Take 1 tablet by mouth 3 (three) times a week. Monday,Wednesday, and Friday 30 tablet 0  . triamcinolone cream (KENALOG) 0.1 % Apply 1 application topically daily.    . Sirolimus (RAPAMUNE) 0.5 MG TABS Take 0.5  mg by mouth daily.     No facility-administered medications prior to visit.     PAST MEDICAL HISTORY: Past Medical History:  Diagnosis Date  . ALL (acute lymphoblastic leukemia) ([place])   . Anemia   . Arthritis   . Brain bleed ([place])    07/04/11  . Diabetes mellitus without complication ([place])   . Headache(784.0)   . Hypertension   . Leukemia ([place])   . Pneumonia   . Pulmonary embolism ([place])    06/22/11  . Thrush of mouth and esophagus ([place]) 04/09/2014    PAST SURGICAL HISTORY: Past Surgical History:  Procedure Laterality Date  . BONE MARROW TRANSPLANT    . CESAREAN SECTION  2000  . CRANIOTOMY  07/04/11  . EYE SURGERY Right 2005   repair crossed eye  . FOOT SURGERY    . greenfield filter    . LUNG BIOPSY  06/19/13    FAMILY HISTORY: Family History  Problem Relation Age of Onset  . Pancreatic cancer Father   . Cancer Mother        colon  . Pancreatic cancer Mother   . Asthma Son   . Hypertension Sister   . Diabetes Neg Hx   . Heart disease Neg Hx   . Breast cancer Neg Hx     SOCIAL HISTORY: Social History   Social History  . Marital status: Divorced    Spouse name: N/A  . Number of children: 2  . Years of education: Associates   Occupational History  . N/A    Social History Main Topics  . Smoking status: Never Smoker  . Smokeless tobacco: Never Used  . Alcohol use No  . Drug use: No  . Sexual activity: Not Currently    Birth control/ protection: Abstinence, None   Other Topics Concern  . Not on file   Social History Narrative   Pt lives with her 2 children.    Associates degree        PHYSICAL EXAM  Vitals:   07/18/16 1435  BP: (!) 133/94  Pulse: (!) 102  Weight: 223 lb (101.2 kg)  Height: _0  (1.702 m)   Body mass index is 34.93 kg/m.  Generalized: Well developed, obese female in no acute distress  Head: normocephalic and atraumatic,. Oropharynx benign  Neck: Supple, Cardiac: Regular rate rhythm, no murmur  Musculoskeletal: No  deformity   Neurological examination   Mentation: Alert oriented to time, place, history taking. Attention span and concentration appropriate. Recent and remote  memory intact.  Follows all commands speech and language fluent.   Cranial nerve II-XII: Pupils were equal round reactive to light extraocular movements were full, visual field were full on confrontational test. Facial sensation and strength were normal. hearing was intact to finger rubbing bilaterally. Uvula tongue midline. head turning and shoulder shrug were normal and symmetric.Tongue protrusion into cheek strength was normal. Motor: normal bulk and tone, full strength in the BUE, BLE, fine finger movements normal, no pronator drift. No focal weakness Sensory: normal and symmetric to light touch, on the face arms and legs Coordination: finger-nose-finger, heel-to-shin bilaterally, no dysmetria Reflexes: Symmetric upper and lower, plantar responses were flexor bilaterally. Gait and Station: Rising up from seated position without assistance, normal stance,  moderate stride, good arm swing, smooth turning, able to perform tiptoe, and heel walking without difficulty. Tandem gait is steady  DIAGNOSTIC DATA (LABS, IMAGING, TESTING) -    Component Value Date/Time   NA 139 06/30/2016 1338   NA 137 11/12/2015 0937   K 4.2 06/30/2016 1338   K 4.0 11/12/2015 0937   CL 106 06/30/2016 1338   CL 106 07/09/2012 0949   CO2 25 06/30/2016 1338   CO2 23 11/12/2015 0937   GLUCOSE 90 06/30/2016 1338   GLUCOSE 140 11/12/2015 0937   GLUCOSE 109 (H) 07/09/2012 0949   BUN 19 06/30/2016 1338   BUN 15.5 11/12/2015 0937   CREATININE 1.40 (H) 06/30/2016 1338   CREATININE 1.5 (H) 11/12/2015 0937   CALCIUM 9.1 06/30/2016 1338   CALCIUM 8.9 11/12/2015 0937   PROT 6.7 06/30/2016 1338   PROT 5.9 (L) 11/12/2015 0937   ALBUMIN 3.5 06/30/2016 1338   ALBUMIN 3.1 (L) 11/12/2015 0937   AST 20 06/30/2016 1338   AST 14 11/12/2015 0937   ALT 19 06/30/2016  1338   ALT 8 11/12/2015 0937   ALKPHOS 125 (H) 06/30/2016 1338   ALKPHOS 76 11/12/2015 0937   BILITOT 0.2 06/30/2016 1338   BILITOT 0.23 11/12/2015 0937   GFRNONAA 49 (L) 06/05/2016 0445   GFRAA 57 (L) 06/05/2016 0445    Lab Results  Component Value Date   HGBA1C 6.9 (H) 05/08/2016    ASSESSMENT AND PLAN  51 y.o. year old female  has a past medical history of chronic headache since June 2013 which have improved with amitriptyline. She is having  headaches which are mild and tolerable. Imitrex still works acutely.MRI of the brain 02/11/2015 without acute findings and no change from MRI dated March 2014.  PLAN: Continue Elavil 10 mg 2 tablets at bedtime will refill Continue Imitrex acutely Continue gabapentin as directed Call for increase in headaches F/U in 8 monthsnext with Dr. Domenic Moras, [facility], [facility], [facility] [address] [place], [place] [address] [phone]

## 2016-07-18 NOTE — Patient Instructions (Signed)
Continue Elavil 10 mg 2 tablets at bedtime will refill Continue Imitrex acutely Continue gabapentin as directed Call for increase in headaches F/U in 8 monthsnext with Dr. Jaynee Eagles

## 2016-08-01 DIAGNOSIS — I10 Essential (primary) hypertension: Secondary | ICD-10-CM | POA: Diagnosis not present

## 2016-08-01 DIAGNOSIS — Z794 Long term (current) use of insulin: Secondary | ICD-10-CM | POA: Diagnosis not present

## 2016-08-01 DIAGNOSIS — D89811 Chronic graft-versus-host disease: Secondary | ICD-10-CM | POA: Diagnosis not present

## 2016-08-01 DIAGNOSIS — C9101 Acute lymphoblastic leukemia, in remission: Secondary | ICD-10-CM | POA: Diagnosis not present

## 2016-08-01 DIAGNOSIS — Z79899 Other long term (current) drug therapy: Secondary | ICD-10-CM | POA: Diagnosis not present

## 2016-08-01 DIAGNOSIS — R509 Fever, unspecified: Secondary | ICD-10-CM | POA: Diagnosis not present

## 2016-08-01 DIAGNOSIS — Z9484 Stem cells transplant status: Secondary | ICD-10-CM | POA: Diagnosis not present

## 2016-08-01 DIAGNOSIS — D801 Nonfamilial hypogammaglobulinemia: Secondary | ICD-10-CM | POA: Diagnosis not present

## 2016-08-01 DIAGNOSIS — E1165 Type 2 diabetes mellitus with hyperglycemia: Secondary | ICD-10-CM | POA: Diagnosis not present

## 2016-08-01 DIAGNOSIS — Z86711 Personal history of pulmonary embolism: Secondary | ICD-10-CM | POA: Diagnosis not present

## 2016-08-01 DIAGNOSIS — T380X5A Adverse effect of glucocorticoids and synthetic analogues, initial encounter: Secondary | ICD-10-CM | POA: Diagnosis not present

## 2016-08-01 DIAGNOSIS — L989 Disorder of the skin and subcutaneous tissue, unspecified: Secondary | ICD-10-CM | POA: Diagnosis not present

## 2016-08-01 DIAGNOSIS — R197 Diarrhea, unspecified: Secondary | ICD-10-CM | POA: Diagnosis not present

## 2016-08-01 DIAGNOSIS — B999 Unspecified infectious disease: Secondary | ICD-10-CM | POA: Diagnosis not present

## 2016-08-01 DIAGNOSIS — Z9221 Personal history of antineoplastic chemotherapy: Secondary | ICD-10-CM | POA: Diagnosis not present

## 2016-08-05 ENCOUNTER — Other Ambulatory Visit: Payer: Self-pay | Admitting: Endocrinology

## 2016-08-10 DIAGNOSIS — Z9484 Stem cells transplant status: Secondary | ICD-10-CM | POA: Diagnosis not present

## 2016-08-10 DIAGNOSIS — D89811 Chronic graft-versus-host disease: Secondary | ICD-10-CM | POA: Diagnosis not present

## 2016-08-10 DIAGNOSIS — Z452 Encounter for adjustment and management of vascular access device: Secondary | ICD-10-CM | POA: Diagnosis not present

## 2016-08-10 DIAGNOSIS — C9101 Acute lymphoblastic leukemia, in remission: Secondary | ICD-10-CM | POA: Diagnosis not present

## 2016-08-12 NOTE — Progress Notes (Signed)
Personally  participated in, made any corrections needed, and agree with history, physical, neuro exam,assessment and plan as stated.      , MD [facility]     

## 2016-08-18 ENCOUNTER — Telehealth: Payer: Self-pay | Admitting: Endocrinology

## 2016-08-18 NOTE — Telephone Encounter (Signed)
Routing to you °

## 2016-08-18 NOTE — Telephone Encounter (Signed)
Forwarding to Jinny Blossom to get more details

## 2016-08-18 NOTE — Telephone Encounter (Signed)
Called patient and left a voice message to inquire about questions regarding her prednisone and to call us back.

## 2016-08-18 NOTE — Telephone Encounter (Signed)
Patient has questions about her predniSONE (DELTASONE) 5 MG tablet. Call patient on home phone [phone] to advise.

## 2016-08-22 NOTE — Telephone Encounter (Signed)
Called patient and she stated that she has had discomfort in her joints and was worried about the prednisone and states that she is taking the 220 aleve just once a day to help ease the pain and discomfort. Her back, hips, and knees are achy. She stated that she is coming in next week for her appointment and that she is currently taking 7.5 of the Prednisone. Please advise if we need to make any changes.

## 2016-08-22 NOTE — Telephone Encounter (Signed)
Joint pains are unrelated to her prednisone dosage, she will need to discuss with PCP

## 2016-08-23 NOTE — Telephone Encounter (Signed)
Called patient and left a voice message that she needs to reach out to her PCP and if anything changes to call us back and we will see her soon.

## 2016-08-24 ENCOUNTER — Other Ambulatory Visit (INDEPENDENT_AMBULATORY_CARE_PROVIDER_SITE_OTHER): Payer: Medicare Other

## 2016-08-24 DIAGNOSIS — E1165 Type 2 diabetes mellitus with hyperglycemia: Secondary | ICD-10-CM

## 2016-08-24 LAB — COMPREHENSIVE METABOLIC PANEL
ALT: 29 U/L (ref 0–35)
AST: 28 U/L (ref 0–37)
Albumin: 3.5 g/dL (ref 3.5–5.2)
Alkaline Phosphatase: 141 U/L — ABNORMAL HIGH (ref 39–117)
BUN: 22 mg/dL (ref 6–23)
CHLORIDE: 105 meq/L (ref 96–112)
CO2: 28 mEq/L (ref 19–32)
Calcium: 9.1 mg/dL (ref 8.4–10.5)
Creatinine, Ser: 1.62 mg/dL — ABNORMAL HIGH (ref 0.40–1.20)
GFR: 42.92 mL/min — ABNORMAL LOW (ref >60.00)
GLUCOSE: 73 mg/dL (ref 70–99)
POTASSIUM: 3.7 meq/L (ref 3.5–5.1)
SODIUM: 140 meq/L (ref 135–145)
Total Bilirubin: 0.2 mg/dL (ref 0.2–1.2)
Total Protein: 6.5 g/dL (ref 6.0–8.3)

## 2016-08-24 LAB — HEMOGLOBIN A1C: HEMOGLOBIN A1C: 5.9 % (ref 4.6–6.5)

## 2016-08-28 ENCOUNTER — Encounter: Payer: Self-pay | Admitting: Endocrinology

## 2016-08-28 ENCOUNTER — Ambulatory Visit (INDEPENDENT_AMBULATORY_CARE_PROVIDER_SITE_OTHER): Payer: Medicare Other | Admitting: Endocrinology

## 2016-08-28 VITALS — BP 124/78 | HR 94 | Ht 67.0 in | Wt 226.2 lb

## 2016-08-28 DIAGNOSIS — E1165 Type 2 diabetes mellitus with hyperglycemia: Secondary | ICD-10-CM | POA: Diagnosis not present

## 2016-08-28 NOTE — Patient Instructions (Addendum)
TAKE BOTH prednisone  IN AM  Janumet 1 daily

## 2016-08-28 NOTE — Progress Notes (Signed)
Patient ID: Lynn [name], female   DOB: 10/24/1965, 51 y.o.   MRN: [id number]    Reason for Appointment: Endocrinology follow-up  Referring physician: Donnie Coffin  History of Present Illness:          DIABETES: Diagnosis: Type 2 diabetes mellitus, date of diagnosis: 2013        Past history: She weighed nearly 300 lb in 2013 around that time her diabetes was diagnosed Although her A1c at that time was reported at 7.4 she does not remember being told about diabetes and no treatment was given In 5/15 when she was getting steroids for her bone marrow transplant her blood sugar was over 400 She was started on insulin and had been taking a basal bolus insulin regimen subsequently while on prednisone Although she required small amounts of insulin she could not taper off insulin even when off prednisone  Recent history:    Currently oral hypoglycemic regimen: 2 tablet of Janumet XR 50/1000    INSULIN regimen is , none recently  She is now on Prednisone 10 mg  A1c is now 5.9, has been as high as 7.3  Current management, blood sugar patterns and problems:  Her blood sugars are being checked occasionally and today she did not bring her monitor  She claims that her blood sugars are generally excellent even after meals and only once after eating a waffle it was 162  Fasting readings are fairly good been near normal, usually about 100 or less and lab glucose was 73  Has gained 6 pounds over the last 2 months  She is trying to be more active some walking but recently less because of joint pains       Oral hypoglycemic drugs the patient is taking are: Janumet XR once a day    Side effects from medications have been: Diarrhea from Metformin over 1000 mg dose  Glucose monitoring:  done less than 1 time a day         Glucometer:  Accu-Chek   Readings <162 at home  Glycemic control:   Lab Results  Component Value Date   HGBA1C 5.9 08/24/2016   HGBA1C 6.9 (H)  05/08/2016   HGBA1C 7.3 (H) 02/10/2016   Lab Results  Component Value Date   MICROALBUR 1.3 05/08/2016   LDLCALC 85 12/14/2014   CREATININE 1.62 (H) 08/24/2016    Self-care: The diet that the patient has been following is: tries to limit meal size  Meals: 2 meals per day.  with breakfast at 11 am. Supper 5 pm   Exercise: walking a little, less now Dietician visit: Most recent: 5/15 at the hospital              Compliance with the medical regimen: Fair  Weight history:  Wt Readings from Last 3 Encounters:  08/28/16 226 lb 3.2 oz (102.6 kg)  07/18/16 223 lb (101.2 kg)  07/03/16 220 lb 9.6 oz (100.1 kg)    PROBLEM 2:   She is Not able to comply with instructions for her steroid taper She is taking this for GVHD and also for her pulmonary condition  She was told to reduce the dose to 7.5 mg and eventually to 5 mg However she thinks that with taking less than 10 mg she is having joint pains and fatigue However the same time she thinks she has difficulty getting up from sitting without pushing up with her hands    Lab on 08/24/2016  Component Date Value  Ref Range Status  . Hgb A1c MFr Bld 08/24/2016 5.9  4.6 - 6.5 % Final   Glycemic Control Guidelines for People with Diabetes:Non Diabetic:  <6%Goal of Therapy: <7%Additional Action Suggested:  >8%   . Sodium 08/24/2016 140  135 - 145 mEq/L Final  . Potassium 08/24/2016 3.7  3.5 - 5.1 mEq/L Final  . Chloride 08/24/2016 105  96 - 112 mEq/L Final  . CO2 08/24/2016 28  19 - 32 mEq/L Final  . Glucose, Bld 08/24/2016 73  70 - 99 mg/dL Final  . BUN 08/24/2016 22  6 - 23 mg/dL Final  . Creatinine, Ser 08/24/2016 1.62* 0.40 - 1.20 mg/dL Final  . Total Bilirubin 08/24/2016 0.2  0.2 - 1.2 mg/dL Final  . Alkaline Phosphatase 08/24/2016 141* 39 - 117 U/L Final  . AST 08/24/2016 28  0 - 37 U/L Final  . ALT 08/24/2016 29  0 - 35 U/L Final  . Total Protein 08/24/2016 6.5  6.0 - 8.3 g/dL Final  . Albumin 08/24/2016 3.5  3.5 - 5.2 g/dL  Final  . Calcium 08/24/2016 9.1  8.4 - 10.5 mg/dL Final  . GFR 08/24/2016 42.92* >60.00 mL/min Final    Allergies as of 08/28/2016      Reactions   Tegaderm Ag Mesh [silver] Rash      Medication List       Accurate as of 08/28/16  9:46 AM. Always use your most recent med list.          ACCU-CHEK AVIVA PLUS test strip Generic drug:  glucose blood USE TO TEST BLOOD GLUCOSE 2 TIMES DAILY   acetaminophen 500 MG tablet Commonly known as:  TYLENOL Take 500 mg by mouth every 6 (six) hours as needed for pain.   acyclovir 800 MG tablet Commonly known as:  ZOVIRAX Take 1 tablet (800 mg total) by mouth 2 (two) times daily. [place]   ADVAIR DISKUS 500-50 MCG/DOSE Aepb Generic drug:  Fluticasone-Salmeterol Inhale 1 puff into the lungs daily.   amitriptyline 10 MG tablet Commonly known as:  ELAVIL Take 2 tablets (20 mg total) by mouth at bedtime.   aspirin-acetaminophen-caffeine 250-250-65 MG tablet Commonly known as:  EXCEDRIN MIGRAINE Take 1 tablet by mouth as needed for headache.   cetirizine 10 MG tablet Commonly known as:  ZYRTEC Take 1 tablet (10 mg total) by mouth daily.   fluconazole 200 MG tablet Commonly known as:  DIFLUCAN Take 200 mg by mouth 2 (two) times daily.   gabapentin 300 MG capsule Commonly known as:  NEURONTIN TAKE ONE CAPSULE BY MOUTH THREE TIMES DAILY   IMBRUVICA 140 MG tablet Generic drug:  ibrutinib Take 280 mg by mouth daily. Take 2 tablets once daily   KLOR-CON M20 20 MEQ tablet Generic drug:  potassium chloride SA TAKE 1 TABLET (20 MEQ TOTAL) BY MOUTH DAILY.   LORazepam 1 MG tablet Commonly known as:  ATIVAN Take 1 mg by mouth every 4 (four) hours as needed for anxiety.   Magnesium 250 MG Tabs Take 250 mg by mouth daily.   Medical Compression Socks Misc Please dispense one pair.   multivitamin tablet Take 1 tablet by mouth daily.   mycophenolate 250 MG capsule Commonly known as:  CELLCEPT Take 500 mg by mouth 2 (two) times daily.     NOVOFINE PLUS 32G X 4 MM Misc Generic drug:  Insulin Pen Needle   NOVOLOG FLEXPEN 100 UNIT/ML FlexPen Generic drug:  insulin aspart INJECT 16 UNITS WITH EACH MEAL PLUS 18-20  EXTRA UNITS IF SUGAR IS OVER 250   ondansetron 4 MG disintegrating tablet Commonly known as:  ZOFRAN ODT Take 1 tablet (4 mg total) by mouth every 8 (eight) hours as needed for nausea or vomiting.   pravastatin 20 MG tablet Commonly known as:  PRAVACHOL TAKE 1 TABLET (20 MG TOTAL) BY MOUTH DAILY.   predniSONE 5 MG tablet Commonly known as:  DELTASONE Take 10 mg by mouth daily with breakfast.   RAPAMUNE 0.5 MG tablet Generic drug:  Sirolimus Take 0.5 mg by mouth daily.   SitaGLIPtin-MetFORMIN HCl 50-1000 MG Tb24 Commonly known as:  JANUMET XR Take 2 tablets by mouth daily.   sulfamethoxazole-trimethoprim 800-160 MG tablet Commonly known as:  BACTRIM DS,SEPTRA DS Take 1 tablet by mouth 3 (three) times a week. Monday,Wednesday, and Friday   triamcinolone cream 0.1 % Commonly known as:  KENALOG Apply 1 application topically daily.       Allergies:  Allergies  Allergen Reactions  . Tegaderm Ag Mesh [Silver] Rash    Past Medical History:  Diagnosis Date  . ALL (acute lymphoblastic leukemia) ([place])   . Anemia   . Arthritis   . Brain bleed ([place])    07/04/11  . Diabetes mellitus without complication ([place])   . Headache(784.0)   . Hypertension   . Leukemia ([place])   . Pneumonia   . Pulmonary embolism ([place])    06/22/11  . Thrush of mouth and esophagus ([place]) 04/09/2014    Past Surgical History:  Procedure Laterality Date  . BONE MARROW TRANSPLANT    . CESAREAN SECTION  2000  . CRANIOTOMY  07/04/11  . EYE SURGERY Right 2005   repair crossed eye  . FOOT SURGERY    . greenfield filter    . LUNG BIOPSY  06/19/13    Family History  Problem Relation Age of Onset  . Pancreatic cancer Father   . Cancer Mother        colon  . Pancreatic cancer Mother   . Asthma Son   . Hypertension Sister   .  Diabetes Neg Hx   . Heart disease Neg Hx   . Breast cancer Neg Hx     Social History:  reports that she has never smoked. She has never used smokeless tobacco. She reports that she does not drink alcohol or use drugs.    Review of Systems        Lipids: She tends to have high triglycerides and mildly increased LDL which is improved, Last below 100 She was started on pravastatin 20 mg daily in 9/16      Lab Results  Component Value Date   CHOL 193 06/30/2016   HDL 48.30 06/30/2016   LDLCALC 85 12/14/2014   LDLDIRECT 102.0 06/30/2016   TRIG 273.0 (H) 06/30/2016   CHOLHDL 4 06/30/2016    She is not taking HCTZ for hypertension, Not on any medications currently  Has Further Decrease in renal function   Lab Results  Component Value Date   CREATININE 1.62 (H) 08/24/2016   BUN 22 08/24/2016   NA 140 08/24/2016   K 3.7 08/24/2016   CL 105 08/24/2016   CO2 28 08/24/2016     She is being followed at [facility] for her various problems including transplant rejection  Her liver functions have been normal Consistently now     Thyroid:   She had a thyroid biopsy in 6/14 for a left-sided thyroid nodule which was benign      Physical  Examination:  BP 124/78   Pulse 94   Ht 5\' 7"  (1.702 m)   Wt 226 lb 3.2 oz (102.6 kg)   LMP 08/09/2011   SpO2 97%   BMI 35.43 kg/m    ASSESSMENT:  Diabetes type 2, uncontrolled  See history of present illness for detailed discussion of  current management, blood sugar patterns and problems identified   Her A1c is 5.9 in blood sugars are excellent at home also However she is gaining weight with her steroids Currently not able to do much exercise because of joint pains  STEROID dependency:  She has been on long-term steroids Although she was able to cut down to 7.5 mg previously she has gone up to 10 mg on her own since she thinks this does help her joint pains She does appear to have some symptoms of proximal muscle weakness in  the legs now Also gaining weight   RENAL dysfunction: This is slightly worse, will defer evaluation to PCP  PLAN:  We will continue her Janumet alone for diabetes but reduce the dose to 1 tablet because of renal dysfunction recently She needs to work with her PCP to evaluate need for prednisone or alternative medications for joint pains, may also need rheumatological evaluation If she is able to start cutting back on prednisone and she can at least take 7.5 alternating with 10 mg     There are no Patient Instructions on file for this visit.  , 08/28/2016, 9:46 AM   Note: This office note was prepared with Dragon voice recognition system technology. Any transcriptional errors that result from this process are unintentional.

## 2016-08-31 ENCOUNTER — Ambulatory Visit (HOSPITAL_COMMUNITY)
Admission: RE | Admit: 2016-08-31 | Discharge: 2016-08-31 | Disposition: A | Discharge status: Home / Self Care | Payer: Medicare Other | Source: Ambulatory Visit | Attending: Family Medicine | Admitting: Family Medicine

## 2016-08-31 ENCOUNTER — Other Ambulatory Visit (HOSPITAL_COMMUNITY): Payer: Self-pay

## 2016-08-31 DIAGNOSIS — M7989 Other specified soft tissue disorders: Secondary | ICD-10-CM

## 2016-08-31 DIAGNOSIS — E669 Obesity, unspecified: Secondary | ICD-10-CM | POA: Diagnosis not present

## 2016-08-31 DIAGNOSIS — M79605 Pain in left leg: Secondary | ICD-10-CM

## 2016-08-31 DIAGNOSIS — M2550 Pain in unspecified joint: Secondary | ICD-10-CM | POA: Diagnosis not present

## 2016-08-31 NOTE — Progress Notes (Signed)
*  PRELIMINARY RESULTS* Vascular Ultrasound Left lower extremity venous duplex has been completed.  Preliminary findings: No evidence of deep vein thrombosis or baker's cyst in the left lower extremity. Moderate amount of interstitial fluid noted in the left calf.  Preliminary results called to Dr. Virgilio Belling office at 13:25, given to [facility].   Myrtie Cruise  08/31/2016, 1:28 PM

## 2016-09-05 DIAGNOSIS — E2740 Unspecified adrenocortical insufficiency: Secondary | ICD-10-CM | POA: Diagnosis not present

## 2016-09-05 DIAGNOSIS — H04123 Dry eye syndrome of bilateral lacrimal glands: Secondary | ICD-10-CM | POA: Diagnosis not present

## 2016-09-05 DIAGNOSIS — D801 Nonfamilial hypogammaglobulinemia: Secondary | ICD-10-CM | POA: Diagnosis not present

## 2016-09-05 DIAGNOSIS — R509 Fever, unspecified: Secondary | ICD-10-CM | POA: Diagnosis not present

## 2016-09-05 DIAGNOSIS — R609 Edema, unspecified: Secondary | ICD-10-CM | POA: Diagnosis not present

## 2016-09-05 DIAGNOSIS — R069 Unspecified abnormalities of breathing: Secondary | ICD-10-CM | POA: Diagnosis not present

## 2016-09-05 DIAGNOSIS — Z794 Long term (current) use of insulin: Secondary | ICD-10-CM | POA: Diagnosis not present

## 2016-09-05 DIAGNOSIS — C9101 Acute lymphoblastic leukemia, in remission: Secondary | ICD-10-CM | POA: Diagnosis not present

## 2016-09-05 DIAGNOSIS — J42 Unspecified chronic bronchitis: Secondary | ICD-10-CM | POA: Diagnosis not present

## 2016-09-05 DIAGNOSIS — L989 Disorder of the skin and subcutaneous tissue, unspecified: Secondary | ICD-10-CM | POA: Diagnosis not present

## 2016-09-05 DIAGNOSIS — Z9484 Stem cells transplant status: Secondary | ICD-10-CM | POA: Diagnosis not present

## 2016-09-05 DIAGNOSIS — D89813 Graft-versus-host disease, unspecified: Secondary | ICD-10-CM | POA: Diagnosis not present

## 2016-09-05 DIAGNOSIS — Z7952 Long term (current) use of systemic steroids: Secondary | ICD-10-CM | POA: Diagnosis not present

## 2016-09-05 DIAGNOSIS — R197 Diarrhea, unspecified: Secondary | ICD-10-CM | POA: Diagnosis not present

## 2016-09-05 DIAGNOSIS — E119 Type 2 diabetes mellitus without complications: Secondary | ICD-10-CM | POA: Diagnosis not present

## 2016-10-04 DIAGNOSIS — M1712 Unilateral primary osteoarthritis, left knee: Secondary | ICD-10-CM | POA: Diagnosis not present

## 2016-10-11 ENCOUNTER — Other Ambulatory Visit: Payer: Self-pay

## 2016-10-11 ENCOUNTER — Telehealth: Payer: Self-pay | Admitting: Endocrinology

## 2016-10-11 MED ORDER — ACCU-CHEK AVIVA PLUS VI STRP: ORAL_STRIP | 5 refills | Status: DC

## 2016-10-11 NOTE — Telephone Encounter (Signed)
MEDICATION: ACCU-CHEK AVIVA PLUS test strip 100  PHARMACY:   [id number] - [place], [place] - [address] [phone] (Phone) [phone] (Fax)   IS THIS A 90 DAY SUPPLY : no  IS PATIENT OUT OF MEDICATION: no  IF NOT; HOW MUCH IS LEFT: 15 left; 5 days  OTHER COMMENTS: Dr. Dwyane Dee has never filled before, normally [facility] fills but they are requiring Dr. Dwyane Dee to see if he can fill. Call patient to advise if needed.    **Let patient know to contact pharmacy at the end of the day to make sure medication is ready. **  ** Please notify patient to allow 48-72 hours to process**  **Encourage patient to contact the pharmacy for refills or they can request refills through [facility]**

## 2016-10-11 NOTE — Telephone Encounter (Signed)
Please advise if okay to send. Also how many times a day does she test?

## 2016-10-11 NOTE — Telephone Encounter (Signed)
She is testing once a day

## 2016-10-11 NOTE — Telephone Encounter (Signed)
Called patient and let her know that I have sent over this prescription for her test strips.

## 2016-10-12 ENCOUNTER — Other Ambulatory Visit: Payer: Self-pay

## 2016-10-12 MED ORDER — ACCU-CHEK AVIVA PLUS VI STRP: ORAL_STRIP | 5 refills | Status: DC

## 2016-10-16 ENCOUNTER — Other Ambulatory Visit: Payer: Self-pay | Admitting: Endocrinology

## 2016-10-18 DIAGNOSIS — R509 Fever, unspecified: Secondary | ICD-10-CM | POA: Diagnosis not present

## 2016-10-18 DIAGNOSIS — C9101 Acute lymphoblastic leukemia, in remission: Secondary | ICD-10-CM | POA: Diagnosis not present

## 2016-10-27 ENCOUNTER — Other Ambulatory Visit: Payer: Self-pay | Admitting: Nurse Practitioner

## 2016-11-02 ENCOUNTER — Other Ambulatory Visit: Payer: Self-pay | Admitting: Endocrinology

## 2016-11-08 DIAGNOSIS — S83242A Other tear of medial meniscus, current injury, left knee, initial encounter: Secondary | ICD-10-CM | POA: Diagnosis not present

## 2016-11-08 DIAGNOSIS — Z92241 Personal history of systemic steroid therapy: Secondary | ICD-10-CM | POA: Diagnosis not present

## 2016-11-08 DIAGNOSIS — D89811 Chronic graft-versus-host disease: Secondary | ICD-10-CM | POA: Diagnosis not present

## 2016-11-08 DIAGNOSIS — H04123 Dry eye syndrome of bilateral lacrimal glands: Secondary | ICD-10-CM | POA: Diagnosis not present

## 2016-11-08 DIAGNOSIS — M1712 Unilateral primary osteoarthritis, left knee: Secondary | ICD-10-CM | POA: Diagnosis not present

## 2016-11-08 DIAGNOSIS — E0965 Drug or chemical induced diabetes mellitus with hyperglycemia: Secondary | ICD-10-CM | POA: Diagnosis not present

## 2016-11-08 DIAGNOSIS — E119 Type 2 diabetes mellitus without complications: Secondary | ICD-10-CM | POA: Diagnosis not present

## 2016-11-08 DIAGNOSIS — H532 Diplopia: Secondary | ICD-10-CM | POA: Diagnosis not present

## 2016-11-08 DIAGNOSIS — Z9889 Other specified postprocedural states: Secondary | ICD-10-CM | POA: Diagnosis not present

## 2016-11-13 ENCOUNTER — Other Ambulatory Visit: Payer: Self-pay | Admitting: Endocrinology

## 2016-11-13 DIAGNOSIS — M25562 Pain in left knee: Secondary | ICD-10-CM | POA: Diagnosis not present

## 2016-11-13 DIAGNOSIS — Z01812 Encounter for preprocedural laboratory examination: Secondary | ICD-10-CM | POA: Diagnosis not present

## 2016-11-13 DIAGNOSIS — M1712 Unilateral primary osteoarthritis, left knee: Secondary | ICD-10-CM | POA: Diagnosis not present

## 2016-11-14 DIAGNOSIS — C9101 Acute lymphoblastic leukemia, in remission: Secondary | ICD-10-CM | POA: Diagnosis not present

## 2016-11-14 DIAGNOSIS — Z9484 Stem cells transplant status: Secondary | ICD-10-CM | POA: Diagnosis not present

## 2016-11-14 DIAGNOSIS — R509 Fever, unspecified: Secondary | ICD-10-CM | POA: Diagnosis not present

## 2016-11-14 DIAGNOSIS — E119 Type 2 diabetes mellitus without complications: Secondary | ICD-10-CM | POA: Diagnosis not present

## 2016-11-14 DIAGNOSIS — D801 Nonfamilial hypogammaglobulinemia: Secondary | ICD-10-CM | POA: Diagnosis not present

## 2016-11-14 DIAGNOSIS — R197 Diarrhea, unspecified: Secondary | ICD-10-CM | POA: Diagnosis not present

## 2016-11-14 DIAGNOSIS — L989 Disorder of the skin and subcutaneous tissue, unspecified: Secondary | ICD-10-CM | POA: Diagnosis not present

## 2016-11-14 DIAGNOSIS — D89813 Graft-versus-host disease, unspecified: Secondary | ICD-10-CM | POA: Diagnosis not present

## 2016-11-14 DIAGNOSIS — E2740 Unspecified adrenocortical insufficiency: Secondary | ICD-10-CM | POA: Diagnosis not present

## 2016-11-14 DIAGNOSIS — Z23 Encounter for immunization: Secondary | ICD-10-CM | POA: Diagnosis not present

## 2016-11-17 DIAGNOSIS — M25562 Pain in left knee: Secondary | ICD-10-CM | POA: Diagnosis not present

## 2016-11-22 DIAGNOSIS — M1712 Unilateral primary osteoarthritis, left knee: Secondary | ICD-10-CM | POA: Diagnosis not present

## 2016-11-23 ENCOUNTER — Other Ambulatory Visit (INDEPENDENT_AMBULATORY_CARE_PROVIDER_SITE_OTHER): Payer: Medicare Other

## 2016-11-23 DIAGNOSIS — E1165 Type 2 diabetes mellitus with hyperglycemia: Secondary | ICD-10-CM | POA: Diagnosis not present

## 2016-11-23 LAB — COMPREHENSIVE METABOLIC PANEL
ALK PHOS: 114 U/L (ref 39–117)
ALT: 29 U/L (ref 0–35)
AST: 42 U/L — ABNORMAL HIGH (ref 0–37)
Albumin: 3.6 g/dL (ref 3.5–5.2)
BUN: 17 mg/dL (ref 6–23)
CHLORIDE: 102 meq/L (ref 96–112)
CO2: 30 meq/L (ref 19–32)
Calcium: 9.1 mg/dL (ref 8.4–10.5)
Creatinine, Ser: 1.33 mg/dL — ABNORMAL HIGH (ref 0.40–1.20)
GFR: 53.84 mL/min — AB (ref >60.00)
GLUCOSE: 84 mg/dL (ref 70–99)
POTASSIUM: 4.1 meq/L (ref 3.5–5.1)
SODIUM: 140 meq/L (ref 135–145)
TOTAL PROTEIN: 6.1 g/dL (ref 6.0–8.3)
Total Bilirubin: 0.3 mg/dL (ref 0.2–1.2)

## 2016-11-23 LAB — LIPID PANEL
Cholesterol: 171 mg/dL (ref 0–200)
HDL: 59.7 mg/dL (ref >39.00)
LDL CALC: 90 mg/dL (ref 0–99)
NONHDL: 110.88
Total CHOL/HDL Ratio: 3
Triglycerides: 106 mg/dL (ref 0.0–149.0)
VLDL: 21.2 mg/dL (ref 0.0–40.0)

## 2016-11-23 LAB — HEMOGLOBIN A1C: HEMOGLOBIN A1C: 6.3 % (ref 4.6–6.5)

## 2016-11-28 ENCOUNTER — Encounter: Payer: Medicare Other | Admitting: Endocrinology

## 2016-11-28 NOTE — Progress Notes (Signed)
This encounter was created in error - please disregard.

## 2016-11-29 DIAGNOSIS — M1712 Unilateral primary osteoarthritis, left knee: Secondary | ICD-10-CM | POA: Diagnosis not present

## 2016-12-04 ENCOUNTER — Ambulatory Visit (INDEPENDENT_AMBULATORY_CARE_PROVIDER_SITE_OTHER): Payer: Medicare Other | Admitting: Endocrinology

## 2016-12-04 ENCOUNTER — Encounter: Payer: Self-pay | Admitting: Endocrinology

## 2016-12-04 VITALS — BP 132/82 | HR 146 | Ht 67.0 in | Wt 240.8 lb

## 2016-12-04 DIAGNOSIS — E7800 Pure hypercholesterolemia, unspecified: Secondary | ICD-10-CM

## 2016-12-04 DIAGNOSIS — I10 Essential (primary) hypertension: Secondary | ICD-10-CM

## 2016-12-04 DIAGNOSIS — E1165 Type 2 diabetes mellitus with hyperglycemia: Secondary | ICD-10-CM

## 2016-12-04 DIAGNOSIS — Z794 Long term (current) use of insulin: Secondary | ICD-10-CM | POA: Diagnosis not present

## 2016-12-04 DIAGNOSIS — Z7952 Long term (current) use of systemic steroids: Secondary | ICD-10-CM

## 2016-12-04 NOTE — Progress Notes (Signed)
Patient ID: Lynn [name], female   DOB: 10/12/1965, 51 y.o.   MRN: [id number]    Reason for Appointment: Endocrinology follow-up  Referring physician: Donnie Coffin  History of Present Illness:          DIABETES: Diagnosis: Type 2 diabetes mellitus, date of diagnosis: 2013        Past history: She weighed nearly 300 lb in 2013 around that time her diabetes was diagnosed Although her A1c at that time was reported at 7.4 she does not remember being told about diabetes and no treatment was given In 5/15 when she was getting steroids for her bone marrow transplant her blood sugar was over 400 She was started on insulin and had been taking a basal bolus insulin regimen subsequently while on prednisone Although she required small amounts of insulin she could not taper off insulin even when off prednisone  Recent history:    Currently oral hypoglycemic regimen: 1 tablet of Janumet XR 50/1000    INSULIN regimen is  recently NovoLog 8 units before meals  She is now on Prednisone 5 mg  A1c is now 6.3, previously , has been as high as 7.3  Current management, blood sugar patterns and problems:  She has not been on insulin and not clear why she started taking Novolog since her last visit in July  Currently she is taking less prednisone than before  She said that she is occasionally having low blood sugars and has 2 readings in the 60s documented in the afternoon  Highest blood sugar recently is 169 after lunch, overall range is 63-169  Her glucose in the lab was 84 after eating a banana  Overall still checking blood sugars very infrequently  She has gained 14 pounds since her last visit but has been less active also because of joint pains  Her Janumet has been at a reduced dose because of renal dysfunction previously       Oral hypoglycemic drugs the patient is taking are: Janumet XR once a day    Side effects from medications have been: Diarrhea from Metformin over  1000 mg dose  Glucose monitoring:  done less than 1 time a day         Glucometer:  Accu-Chek   Readings  range from 63-1 69 with average 107 and only some readings midday and early afternoon and twice after dinner   Glycemic control:   Lab Results  Component Value Date   HGBA1C 6.3 11/23/2016   HGBA1C 5.9 08/24/2016   HGBA1C 6.9 (H) 05/08/2016   Lab Results  Component Value Date   MICROALBUR 1.3 05/08/2016   LDLCALC 90 11/23/2016   CREATININE 1.33 (H) 11/23/2016    Self-care: The diet that the patient has been following is: tries to limit meal size  Meals: 2 meals per day.  with breakfast at 11 am. Supper 5 pm   Exercise: walking a little, less now Dietician visit: Most recent: 5/15 at the hospital              Compliance with the medical regimen: Fair  Weight history:  Wt Readings from Last 3 Encounters:  12/04/16 240 lb 12.8 oz (109.2 kg)  08/28/16 226 lb 3.2 oz (102.6 kg)  07/18/16 223 lb (101.2 kg)    PROBLEM 2:  She is taking PREDNISONE for GVHD and also for her pulmonary condition  Was told to discuss tapering her prednisone with her PCP or endocrinologist and has been mostly seen  myself for this Previously was having symptoms with taking less than 10 mg, mostly joint pains and fatigue She was told to reduce the dose to 7.5 mg alternating with 10 mg on the last visit With this she has been able to cut her prednisone down more recently without side effects However last month when she had fever and other issues she felt somewhat dizzy and fell down in the morning once and increase her prednisone back to 10 mg for a few days  Since 10/1  prednisone doses down to 5 mg However she thinks that More recently she is starting to feel a little fatigued    No visits with results within 1 Week(s) from this visit.  Latest known visit with results is:  Lab on 11/23/2016  Component Date Value Ref Range Status  . Hgb A1c MFr Bld 11/23/2016 6.3  4.6 - 6.5 % Final    Glycemic Control Guidelines for People with Diabetes:Non Diabetic:  <6%Goal of Therapy: <7%Additional Action Suggested:  >8%   . Sodium 11/23/2016 140  135 - 145 mEq/L Final  . Potassium 11/23/2016 4.1  3.5 - 5.1 mEq/L Final  . Chloride 11/23/2016 102  96 - 112 mEq/L Final  . CO2 11/23/2016 30  19 - 32 mEq/L Final  . Glucose, Bld 11/23/2016 84  70 - 99 mg/dL Final  . BUN 11/23/2016 17  6 - 23 mg/dL Final  . Creatinine, Ser 11/23/2016 1.33* 0.40 - 1.20 mg/dL Final  . Total Bilirubin 11/23/2016 0.3  0.2 - 1.2 mg/dL Final  . Alkaline Phosphatase 11/23/2016 114  39 - 117 U/L Final  . AST 11/23/2016 42* 0 - 37 U/L Final  . ALT 11/23/2016 29  0 - 35 U/L Final  . Total Protein 11/23/2016 6.1  6.0 - 8.3 g/dL Final  . Albumin 11/23/2016 3.6  3.5 - 5.2 g/dL Final  . Calcium 11/23/2016 9.1  8.4 - 10.5 mg/dL Final  . GFR 11/23/2016 53.84* >60.00 mL/min Final  . Cholesterol 11/23/2016 171  0 - 200 mg/dL Final   ATP III Classification       Desirable:  < 200 mg/dL               Borderline High:  200 - 239 mg/dL          High:  > = 240 mg/dL  . Triglycerides 11/23/2016 106.0  0.0 - 149.0 mg/dL Final   Normal:  <150 mg/dLBorderline High:  150 - 199 mg/dL  . HDL 11/23/2016 59.70  >39.00 mg/dL Final  . VLDL 11/23/2016 21.2  0.0 - 40.0 mg/dL Final  . LDL Cholesterol 11/23/2016 90  0 - 99 mg/dL Final  . Total CHOL/HDL Ratio 11/23/2016 3   Final                  Men          Women1/2 Average Risk     3.4          3.3Average Risk          5.0          4.42X Average Risk          9.6          7.13X Average Risk          15.0          11.0                      .  NonHDL 11/23/2016 110.88   Final   NOTE:  Non-HDL goal should be 30 mg/dL higher than patient's LDL goal (i.e. LDL goal of < 70 mg/dL, would have non-HDL goal of < 100 mg/dL)    Allergies as of 12/04/2016      Reactions   Tegaderm Ag Mesh [silver] Rash      Medication List        Accurate as of 12/04/16  5:07 PM. Always use your most recent med  list.          ACCU-CHEK AVIVA PLUS test strip Generic drug:  glucose blood USE TO TEST BLOOD GLUCOSE UP TO 2 TIMES DAILY   acyclovir 800 MG tablet Commonly known as:  ZOVIRAX Take 1 tablet (800 mg total) by mouth 2 (two) times daily. Marland Kitchen   ADVAIR DISKUS 500-50 MCG/DOSE Aepb Generic drug:  Fluticasone-Salmeterol Inhale 1 puff into the lungs daily.   amitriptyline 10 MG tablet Commonly known as:  ELAVIL Take 2 tablets (20 mg total) by mouth at bedtime.   aspirin-acetaminophen-caffeine 250-250-65 MG tablet Commonly known as:  EXCEDRIN MIGRAINE Take 1 tablet by mouth as needed for headache.   cetirizine 10 MG tablet Commonly known as:  ZYRTEC Take 1 tablet (10 mg total) by mouth daily.   fluconazole 200 MG tablet Commonly known as:  DIFLUCAN Take 200 mg by mouth 2 (two) times daily.   gabapentin 300 MG capsule Commonly known as:  NEURONTIN TAKE ONE CAPSULE BY MOUTH THREE TIMES DAILY   IMBRUVICA 140 MG tablet Generic drug:  ibrutinib Take 280 mg by mouth daily. Take 2 tablets once daily   JANUMET XR 50-1000 MG Tb24 Generic drug:  SitaGLIPtin-MetFORMIN HCl TAKE 2 TABLETS BY MOUTH DAILY.   KLOR-CON M20 20 MEQ tablet Generic drug:  potassium chloride SA TAKE 1 TABLET (20 MEQ TOTAL) BY MOUTH DAILY.   LORazepam 1 MG tablet Commonly known as:  ATIVAN Take 1 mg by mouth every 4 (four) hours as needed for anxiety.   Magnesium 250 MG Tabs Take 250 mg by mouth daily.   Medical Compression Socks Misc Please dispense one pair.   multivitamin tablet Take 1 tablet by mouth daily.   mycophenolate 250 MG capsule Commonly known as:  CELLCEPT Take 250 mg 2 (two) times daily by mouth.   NOVOFINE PLUS 32G X 4 MM Misc Generic drug:  Insulin Pen Needle   NOVOLOG FLEXPEN 100 UNIT/ML FlexPen Generic drug:  insulin aspart INJECT 16 UNITS WITH EACH MEAL PLUS 18-20 EXTRA UNITS IF SUGAR IS OVER 250   ondansetron 4 MG disintegrating tablet Commonly known as:  ZOFRAN ODT Take 1  tablet (4 mg total) by mouth every 8 (eight) hours as needed for nausea or vomiting.   pravastatin 20 MG tablet Commonly known as:  PRAVACHOL TAKE 1 TABLET (20 MG TOTAL) BY MOUTH DAILY.   predniSONE 5 MG tablet Commonly known as:  DELTASONE Take 10 mg by mouth daily with breakfast.   RAPAMUNE 0.5 MG tablet Generic drug:  Sirolimus Take 0.5 mg by mouth daily.   sulfamethoxazole-trimethoprim 800-160 MG tablet Commonly known as:  BACTRIM DS,SEPTRA DS Take 1 tablet by mouth 3 (three) times a week. Monday,Wednesday, and Friday   triamcinolone cream 0.1 % Commonly known as:  KENALOG Apply 1 application topically daily.       Allergies:  Allergies  Allergen Reactions  . Tegaderm Ag Mesh [Silver] Rash    Past Medical History:  Diagnosis Date  . ALL (acute lymphoblastic leukemia) ([place])   .  Anemia   . Arthritis   . Brain bleed ([place])    07/04/11  . Diabetes mellitus without complication ([place])   . Headache(784.0)   . Hypertension   . Leukemia ([place])   . Pneumonia   . Pulmonary embolism ([place])    06/22/11  . Thrush of mouth and esophagus ([place]) 04/09/2014    Past Surgical History:  Procedure Laterality Date  . BONE MARROW TRANSPLANT    . CESAREAN SECTION  2000  . CRANIOTOMY  07/04/11  . EYE SURGERY Right 2005   repair crossed eye  . FOOT SURGERY    . greenfield filter    . LUNG BIOPSY  06/19/13    Family History  Problem Relation Age of Onset  . Pancreatic cancer Father   . Cancer Mother        colon  . Pancreatic cancer Mother   . Asthma Son   . Hypertension Sister   . Diabetes Neg Hx   . Heart disease Neg Hx   . Breast cancer Neg Hx     Social History:  reports that  has never smoked. she has never used smokeless tobacco. She reports that she does not drink alcohol or use drugs.    Review of Systems        Lipids: She tends to have high triglycerides and mildly increased LDL which is controlled She was started on pravastatin 20 mg daily in 9/16 With good  results Her PCP tried her off the medication for months because of her joint pains but she does not think her joint pains improved      Lab Results  Component Value Date   CHOL 171 11/23/2016   HDL 59.70 11/23/2016   LDLCALC 90 11/23/2016   LDLDIRECT 102.0 06/30/2016   TRIG 106.0 11/23/2016   CHOLHDL 3 11/23/2016    She is not taking HCTZ for hypertension, Not on any medications again  Renal function appears to be improved compared to 3 months ago  Lab Results  Component Value Date   CREATININE 1.33 (H) 11/23/2016   BUN 17 11/23/2016   NA 140 11/23/2016   K 4.1 11/23/2016   CL 102 11/23/2016   CO2 30 11/23/2016     She is [facility] for her various problems including transplant rejection  Her liver functions have been normal mostly      Thyroid:   She had a thyroid biopsy in 6/14 for a left-sided thyroid nodule which was benign    She had a meniscus tear on the left side of the knee  Physical Examination:  BP 132/82   Pulse (!) 146   Ht 5\' 7"  (1.702 m)   Wt 240 lb 12.8 oz (109.2 kg)   LMP 08/09/2011   SpO2 97%   BMI 37.71 kg/m   BP 130/90 Standing  No edema  ASSESSMENT:  Diabetes type 2, uncontrolled  See history of present illness for detailed discussion of  current management, blood sugar patterns and problems identified   Her A1c is  still normal at 6.3 Her blood sugars are fairly good with some tendency to hypoglycemia because she has been taking Novolog on her own With this she appears to be gaining weight Not clear why she started NovoLog and discussed that this is probably tending to cause her low blood sugars and weight gain  Since her renal function is improved she can go up to 2 tablets of the Janumet XR She can stop NovoLog now  STEROID dependency:  She has been on long-term steroids Although she was  not able to cut down  her dosage she is now taking 5 mg No orthostatic drop in blood pressure She does seem to be  having more fatigue and can temporarily go up to 7.5 mg Most probably she can go down to 5 mg again this week and Also discussed that if she is having acute illness or fever she will need to increase the dose to at least 10 mg temporarily   RENAL dysfunction: This is slightly Better  HYPERCHOLESTEROLEMIA: Well controlled  LIVER dysfunction: Minimal increase, unlikely to be from a statin drug We'll continue to monitor and also she will follow-up with PCP  PLAN:  As above  Follow-up in 3 months   She will need more regular follow-up of blood pressure, currently not on treatment and blood pressure is not consistently high   Patient Instructions  Prednisone 7.5 for 4-5 days then 5 mg daily  Take 2 Janumet daily   , 12/04/2016, 5:07 PM   Note: This office note was prepared with Estate agent. Any transcriptional errors that result from this process are unintentional.  Total visit time for evaluation and management of multiple problems in counseling = 25 minutes

## 2016-12-04 NOTE — Patient Instructions (Addendum)
Prednisone 7.5 for 4-5 days then 5 mg daily  Take 2 Janumet daily

## 2016-12-07 DIAGNOSIS — M1712 Unilateral primary osteoarthritis, left knee: Secondary | ICD-10-CM | POA: Diagnosis not present

## 2016-12-15 DIAGNOSIS — M1712 Unilateral primary osteoarthritis, left knee: Secondary | ICD-10-CM | POA: Diagnosis not present

## 2017-01-17 DIAGNOSIS — Z794 Long term (current) use of insulin: Secondary | ICD-10-CM | POA: Diagnosis not present

## 2017-01-17 DIAGNOSIS — Z86711 Personal history of pulmonary embolism: Secondary | ICD-10-CM | POA: Diagnosis not present

## 2017-01-17 DIAGNOSIS — R5382 Chronic fatigue, unspecified: Secondary | ICD-10-CM | POA: Diagnosis not present

## 2017-01-17 DIAGNOSIS — C9101 Acute lymphoblastic leukemia, in remission: Secondary | ICD-10-CM | POA: Diagnosis not present

## 2017-01-17 DIAGNOSIS — H04123 Dry eye syndrome of bilateral lacrimal glands: Secondary | ICD-10-CM | POA: Diagnosis not present

## 2017-01-17 DIAGNOSIS — J42 Unspecified chronic bronchitis: Secondary | ICD-10-CM | POA: Diagnosis not present

## 2017-01-17 DIAGNOSIS — D89811 Chronic graft-versus-host disease: Secondary | ICD-10-CM | POA: Diagnosis not present

## 2017-01-17 DIAGNOSIS — Z7952 Long term (current) use of systemic steroids: Secondary | ICD-10-CM | POA: Diagnosis not present

## 2017-01-17 DIAGNOSIS — E119 Type 2 diabetes mellitus without complications: Secondary | ICD-10-CM | POA: Diagnosis not present

## 2017-01-17 DIAGNOSIS — J84116 Cryptogenic organizing pneumonia: Secondary | ICD-10-CM | POA: Diagnosis not present

## 2017-01-17 DIAGNOSIS — Z9484 Stem cells transplant status: Secondary | ICD-10-CM | POA: Diagnosis not present

## 2017-01-17 DIAGNOSIS — D899 Disorder involving the immune mechanism, unspecified: Secondary | ICD-10-CM | POA: Diagnosis not present

## 2017-01-17 DIAGNOSIS — D801 Nonfamilial hypogammaglobulinemia: Secondary | ICD-10-CM | POA: Diagnosis not present

## 2017-01-17 DIAGNOSIS — R7989 Other specified abnormal findings of blood chemistry: Secondary | ICD-10-CM | POA: Diagnosis not present

## 2017-01-31 ENCOUNTER — Other Ambulatory Visit: Payer: Self-pay | Admitting: Endocrinology

## 2017-02-05 ENCOUNTER — Other Ambulatory Visit: Payer: Self-pay | Admitting: Nurse Practitioner

## 2017-03-01 DIAGNOSIS — R509 Fever, unspecified: Secondary | ICD-10-CM | POA: Diagnosis not present

## 2017-03-01 DIAGNOSIS — R109 Unspecified abdominal pain: Secondary | ICD-10-CM | POA: Diagnosis not present

## 2017-03-02 ENCOUNTER — Other Ambulatory Visit (INDEPENDENT_AMBULATORY_CARE_PROVIDER_SITE_OTHER): Payer: Medicare Other

## 2017-03-02 DIAGNOSIS — Z794 Long term (current) use of insulin: Secondary | ICD-10-CM

## 2017-03-02 DIAGNOSIS — E1165 Type 2 diabetes mellitus with hyperglycemia: Secondary | ICD-10-CM | POA: Diagnosis not present

## 2017-03-02 LAB — COMPREHENSIVE METABOLIC PANEL
ALT: 15 U/L (ref 0–35)
AST: 16 U/L (ref 0–37)
Albumin: 3.3 g/dL — ABNORMAL LOW (ref 3.5–5.2)
Alkaline Phosphatase: 112 U/L (ref 39–117)
BUN: 18 mg/dL (ref 6–23)
CALCIUM: 8.6 mg/dL (ref 8.4–10.5)
CHLORIDE: 106 meq/L (ref 96–112)
CO2: 25 meq/L (ref 19–32)
Creatinine, Ser: 1.28 mg/dL — ABNORMAL HIGH (ref 0.40–1.20)
GFR: 56.21 mL/min — AB (ref >60.00)
GLUCOSE: 93 mg/dL (ref 70–99)
POTASSIUM: 4.1 meq/L (ref 3.5–5.1)
Sodium: 137 mEq/L (ref 135–145)
Total Bilirubin: 0.2 mg/dL (ref 0.2–1.2)
Total Protein: 6.5 g/dL (ref 6.0–8.3)

## 2017-03-02 LAB — HEMOGLOBIN A1C: HEMOGLOBIN A1C: 6.6 % — AB (ref 4.6–6.5)

## 2017-03-07 ENCOUNTER — Ambulatory Visit (INDEPENDENT_AMBULATORY_CARE_PROVIDER_SITE_OTHER): Payer: Medicare Other | Admitting: Endocrinology

## 2017-03-07 ENCOUNTER — Encounter: Payer: Self-pay | Admitting: Endocrinology

## 2017-03-07 VITALS — BP 130/82 | HR 98 | Ht 67.0 in | Wt 247.6 lb

## 2017-03-07 DIAGNOSIS — Z7952 Long term (current) use of systemic steroids: Secondary | ICD-10-CM | POA: Diagnosis not present

## 2017-03-07 DIAGNOSIS — E1165 Type 2 diabetes mellitus with hyperglycemia: Secondary | ICD-10-CM

## 2017-03-07 DIAGNOSIS — R5383 Other fatigue: Secondary | ICD-10-CM

## 2017-03-07 MED ORDER — SEMAGLUTIDE(0.25 OR 0.5MG/DOS) 2 MG/1.5ML ~~LOC~~ SOPN: 0.5000 mg | PEN_INJECTOR | SUBCUTANEOUS | 2 refills | Status: DC

## 2017-03-07 NOTE — Patient Instructions (Addendum)
Start OZEMPIC using the pen as shown once weekly on the same day of the week.   You may inject in the stomach, thigh or arm as indicated in the brochure given. If you have any difficulties using the pen see the video at [email or web address]  You will feel fullness of the stomach with starting the medication and should try to keep the portions at meals small.  You may experience nausea in the first few days which usually gets better over time    If you have any questions or concerns please call the office or talk to our nurse educator Leonia Reader   You may also talk to a nurse educator with the company at [phone] Useful website: [email or web address]   Use 0.5 if sugar stays high

## 2017-03-07 NOTE — Progress Notes (Signed)
Patient ID: Lynn [name], female   DOB: 07-Mar-1965, 52 y.o.   MRN: [id number]    Reason for Appointment: Endocrinology follow-up  Referring physician: Donnie Coffin  History of Present Illness:          DIABETES: Diagnosis: Type 2 diabetes mellitus, date of diagnosis: 2013        Past history: She weighed nearly 300 lb in 2013 around that time her diabetes was diagnosed Although her A1c at that time was reported at 7.4 she does not remember being told about diabetes and no treatment was given In 5/15 when she was getting steroids for her bone marrow transplant her blood sugar was over 400 She was started on insulin and had been taking a basal bolus insulin regimen subsequently while on prednisone Although she required small amounts of insulin she could not taper off insulin even when off prednisone  Recent history:    Currently oral hypoglycemic regimen: 1 tablet of Janumet XR 50/1000    INSULIN regimen is NovoLog 8 units as needed with meals  She is now on Prednisone 10 mg but variable doses  A1c is now 6.6, has been as low  as 5.9 previously   Current management, blood sugar patterns and problems:  She has not been checking her blood sugar consistently and only sporadically   She says her sugars have been sporadically higher also with taking prednisone and variable doses, she will take as much as 30 mg when she has flareup of her fever  She has only a couple of readings later at night highest was 200, she would take NovoLog when the blood sugars are high only  She continues to be on 2 tablets of Janumet XR  She does not exercise because of fatigue and leg swelling  She thinks her meals are still small portions       Oral hypoglycemic drugs the patient is taking are: Janumet XR 2 a day    Side effects from medications have been: Diarrhea from Metformin over 1000 mg dose  Glucose monitoring:  done less than 1 time a day         Glucometer:  Accu-Chek    Mean values apply above for all meters except median for One Touch  PRE-MEAL Fasting Lunch Dinner Bedtime Overall  Glucose range: 112-173  164, 133  193, 203    Mean/median:     157   POST-MEAL PC Breakfast PC Lunch PC Dinner  Glucose range:  ?     Mean/median:        Glycemic control:   Lab Results  Component Value Date   HGBA1C 6.6 (H) 03/02/2017   HGBA1C 6.3 11/23/2016   HGBA1C 5.9 08/24/2016   Lab Results  Component Value Date   MICROALBUR 1.3 05/08/2016   LDLCALC 90 11/23/2016   CREATININE 1.28 (H) 03/02/2017    Self-care: The diet that the patient has been following is: tries to limit meal size  Meals: 2 meals per day.  with breakfast at 11 am. Supper 5 pm   Exercise: walking a little, less now Dietician visit: Most recent: 5/15 at the hospital              Compliance with the medical regimen: Fair  Weight history:  Wt Readings from Last 3 Encounters:  03/07/17 247 lb 9.6 oz (112.3 kg)  12/04/16 240 lb 12.8 oz (109.2 kg)  08/28/16 226 lb 3.2 oz (102.6 kg)    PROBLEM 2:  She  is taking PREDNISONE for GVHD and also for her pulmonary condition  Was told to discuss tapering her prednisone with her PCP or endocrinologist and has been mostly seen myself for this Previously was having symptoms with taking less than 10 mg, mostly joint pains and fatigue She was told to reduce the dose to 7.5 mg alternating with 10 mg on the last visit With this she has been able to cut her prednisone down more recently without side effects However last month when she had fever and other issues she felt somewhat dizzy and fell down in the morning once and increase her prednisone back to 10 mg for a few days  Since 10/1  prednisone doses down to 5 mg However she thinks that More recently she is starting to feel a little fatigued    Lab on 03/02/2017  Component Date Value Ref Range Status  . Hgb A1c MFr Bld 03/02/2017 6.6* 4.6 - 6.5 % Final   Glycemic Control Guidelines for  People with Diabetes:Non Diabetic:  <6%Goal of Therapy: <7%Additional Action Suggested:  >8%   . Sodium 03/02/2017 137  135 - 145 mEq/L Final  . Potassium 03/02/2017 4.1  3.5 - 5.1 mEq/L Final  . Chloride 03/02/2017 106  96 - 112 mEq/L Final  . CO2 03/02/2017 25  19 - 32 mEq/L Final  . Glucose, Bld 03/02/2017 93  70 - 99 mg/dL Final  . BUN 03/02/2017 18  6 - 23 mg/dL Final  . Creatinine, Ser 03/02/2017 1.28* 0.40 - 1.20 mg/dL Final  . Total Bilirubin 03/02/2017 0.2  0.2 - 1.2 mg/dL Final  . Alkaline Phosphatase 03/02/2017 112  39 - 117 U/L Final  . AST 03/02/2017 16  0 - 37 U/L Final  . ALT 03/02/2017 15  0 - 35 U/L Final  . Total Protein 03/02/2017 6.5  6.0 - 8.3 g/dL Final  . Albumin 03/02/2017 3.3* 3.5 - 5.2 g/dL Final  . Calcium 03/02/2017 8.6  8.4 - 10.5 mg/dL Final  . GFR 03/02/2017 56.21* >60.00 mL/min Final    Allergies as of 03/07/2017      Reactions   Tegaderm Ag Mesh [silver] Rash      Medication List        Accurate as of 03/07/17  2:09 PM. Always use your most recent med list.          ACCU-CHEK AVIVA PLUS test strip Generic drug:  glucose blood USE TO TEST BLOOD GLUCOSE UP TO 2 TIMES DAILY   acyclovir 800 MG tablet Commonly known as:  ZOVIRAX Take 1 tablet (800 mg total) by mouth 2 (two) times daily. Marland Kitchen   ADVAIR DISKUS 500-50 MCG/DOSE Aepb Generic drug:  Fluticasone-Salmeterol Inhale 1 puff into the lungs daily.   amitriptyline 10 MG tablet Commonly known as:  ELAVIL Take 2 tablets (20 mg total) by mouth at bedtime.   aspirin-acetaminophen-caffeine 250-250-65 MG tablet Commonly known as:  EXCEDRIN MIGRAINE Take 1 tablet by mouth as needed for headache.   cetirizine 10 MG tablet Commonly known as:  ZYRTEC Take 1 tablet (10 mg total) by mouth daily.   fluconazole 200 MG tablet Commonly known as:  DIFLUCAN Take 200 mg by mouth 2 (two) times daily.   gabapentin 300 MG capsule Commonly known as:  NEURONTIN TAKE ONE CAPSULE BY MOUTH THREE TIMES DAILY     IMBRUVICA 140 MG tablet Generic drug:  ibrutinib Take 280 mg by mouth daily. Take 2 tablets once daily   JANUMET XR 50-1000 MG Tb24  Generic drug:  SitaGLIPtin-MetFORMIN HCl TAKE 2 TABLETS BY MOUTH DAILY.   KLOR-CON M20 20 MEQ tablet Generic drug:  potassium chloride SA TAKE 1 TABLET (20 MEQ TOTAL) BY MOUTH DAILY.   LORazepam 1 MG tablet Commonly known as:  ATIVAN Take 1 mg by mouth every 4 (four) hours as needed for anxiety.   Magnesium 250 MG Tabs Take 250 mg by mouth daily.   Medical Compression Socks Misc Please dispense one pair.   multivitamin tablet Take 1 tablet by mouth daily.   mycophenolate 250 MG capsule Commonly known as:  CELLCEPT Take 250 mg 2 (two) times daily by mouth.   NOVOFINE PLUS 32G X 4 MM Misc Generic drug:  Insulin Pen Needle   NOVOLOG FLEXPEN 100 UNIT/ML FlexPen Generic drug:  insulin aspart INJECT 16 UNITS WITH EACH MEAL PLUS 18-20 EXTRA UNITS IF SUGAR IS OVER 250   ondansetron 4 MG disintegrating tablet Commonly known as:  ZOFRAN ODT Take 1 tablet (4 mg total) by mouth every 8 (eight) hours as needed for nausea or vomiting.   pravastatin 20 MG tablet Commonly known as:  PRAVACHOL TAKE 1 TABLET (20 MG TOTAL) BY MOUTH DAILY.   predniSONE 5 MG tablet Commonly known as:  DELTASONE Take 10 mg by mouth daily with breakfast.   RAPAMUNE 0.5 MG tablet Generic drug:  Sirolimus Take 0.5 mg by mouth daily.   Semaglutide 0.25 or 0.5 MG/DOSE Sopn Commonly known as:  OZEMPIC Inject 0.5 mg into the skin once a week.   sulfamethoxazole-trimethoprim 800-160 MG tablet Commonly known as:  BACTRIM DS,SEPTRA DS Take 1 tablet by mouth 3 (three) times a week. Monday,Wednesday, and Friday   triamcinolone cream 0.1 % Commonly known as:  KENALOG Apply 1 application topically daily.       Allergies:  Allergies  Allergen Reactions  . Tegaderm Ag Mesh [Silver] Rash    Past Medical History:  Diagnosis Date  . ALL (acute lymphoblastic leukemia)  ([place])   . Anemia   . Arthritis   . Brain bleed ([place])    07/04/11  . Diabetes mellitus without complication ([place])   . Headache(784.0)   . Hypertension   . Leukemia ([place])   . Pneumonia   . Pulmonary embolism ([place])    06/22/11  . Thrush of mouth and esophagus ([place]) 04/09/2014    Past Surgical History:  Procedure Laterality Date  . BONE MARROW TRANSPLANT    . CESAREAN SECTION  2000  . CRANIOTOMY  07/04/11  . EYE SURGERY Right 2005   repair crossed eye  . FOOT SURGERY    . greenfield filter    . LUNG BIOPSY  06/19/13    Family History  Problem Relation Age of Onset  . Pancreatic cancer Father   . Cancer Mother        colon  . Pancreatic cancer Mother   . Asthma Son   . Hypertension Sister   . Diabetes Neg Hx   . Heart disease Neg Hx   . Breast cancer Neg Hx     Social History:  reports that  has never smoked. she has never used smokeless tobacco. She reports that she does not drink alcohol or use drugs.    Review of Systems   She had periods of fatigue      Lipids: She tends to have high triglycerides and mildly increased LDL which is controlled She was started on pravastatin 20 mg daily in 9/16 With good results Her PCP tried her off the  medication for months because of her joint pains but she does not think her joint pains improved      Lab Results  Component Value Date   CHOL 171 11/23/2016   HDL 59.70 11/23/2016   LDLCALC 90 11/23/2016   LDLDIRECT 102.0 06/30/2016   TRIG 106.0 11/23/2016   CHOLHDL 3 11/23/2016     Not on antihypertensives  RENAL function appears to be improved    Lab Results  Component Value Date   CREATININE 1.28 (H) 03/02/2017   BUN 18 03/02/2017   NA 137 03/02/2017   K 4.1 03/02/2017   CL 106 03/02/2017   CO2 25 03/02/2017     She is [facility] for her various problems including transplant rejection  Her liver functions have been normal mostly      Thyroid:   She had a thyroid biopsy in 6/14 for a  left-sided thyroid nodule which was benign  She is asking for treatment of her left leg edema, she thinks she is trying to use some elastic stockings but still having problems  Physical Examination:  BP 130/82 (BP Location: Left Arm, Patient Position: Sitting, Cuff Size: Normal)   Pulse 98   Ht 5\' 7"  (1.702 m)   Wt 247 lb 9.6 oz (112.3 kg)   LMP 08/09/2011   SpO2 99%   BMI 38.78 kg/m     Left leg edema present  ASSESSMENT:  Diabetes type 2, uncontrolled  See history of present illness for detailed discussion of  current management, blood sugar patterns and problems identified   Her A1c is still fairly good at 6.6 but she is having variable blood sugars Most of her high sugars are related to taking higher doses of prednisone at times  Most of her readings are high at night but sometimes will be high in the morning also if she takes prednisone at night She is also gaining weight and needs more effective therapy to prevent her weight gain She is also not able to exercise  STEROID dependency: She has been on variable doses of steroids and will continue to do so because of various conditions including reportedly having sweet" syndrome   RENAL dysfunction: This is slightly Better  Leg edema: She will continue to work with her PCP and this, discussed need for probably more effective compression stockings  PLAN:  Discussed with the patient the nature of GLP-1 drugs, the action on various organ systems, how they benefit blood glucose control, as well as the benefit of weight loss and  increase satiety . Explained possible side effects, particularly nausea and vomiting that usually resolve over time; discussed safety information in package insert. Demonstrated the OZEMPIC injection device and injection technique to the patient.  Showed patient where to inject the medication. To start with 0.25 mg dosage weekly for the first 4 weeks and increase the dose if needed Patient brochure on  Ozempic with enclosed co-pay card given She will continue on Janumet for now but consider switching to metformin subsequently More consistent glucose monitoring Most likely will not need insulin  Continue potassium once a day as her potassium is being maintained normal with this, not on diuretics at this time  Follow-up in 6 weeks   Counseling time on subjects discussed in assessment and plan sections is over 50% of today's 25 minute visit   Patient Instructions  Start OZEMPIC using the pen as shown once weekly on the same day of the week.   You may  inject in the stomach, thigh or arm as indicated in the brochure given. If you have any difficulties using the pen see the video at [email or web address]  You will feel fullness of the stomach with starting the medication and should try to keep the portions at meals small.  You may experience nausea in the first few days which usually gets better over time    If you have any questions or concerns please call the office or talk to our nurse educator Leonia Reader   You may also talk to a nurse educator with the company at [phone] Useful website: [email or web address]   Use 0.5 if sugar stays high     Elayne Snare 03/07/2017, 2:09 PM   Note: This office note was prepared with Dragon voice recognition system technology. Any transcriptional errors that result from this process are unintentional.

## 2017-03-08 ENCOUNTER — Other Ambulatory Visit: Payer: Self-pay | Admitting: Endocrinology

## 2017-03-20 ENCOUNTER — Ambulatory Visit (INDEPENDENT_AMBULATORY_CARE_PROVIDER_SITE_OTHER): Payer: Medicare Other | Admitting: Neurology

## 2017-03-20 ENCOUNTER — Encounter: Payer: Self-pay | Admitting: Neurology

## 2017-03-20 VITALS — BP 136/90 | HR 100 | Ht 67.0 in | Wt 244.0 lb

## 2017-03-20 DIAGNOSIS — G43009 Migraine without aura, not intractable, without status migrainosus: Secondary | ICD-10-CM

## 2017-03-20 MED ORDER — SUMATRIPTAN SUCCINATE 100 MG PO TABS: 100.0000 mg | ORAL_TABLET | Freq: Once | ORAL | 12 refills | Status: DC | PRN

## 2017-03-20 NOTE — Progress Notes (Signed)
[name]    Provider:  Dr Jaynee [name] Referring Provider: Alroy [name], L.Lynn Sa, MD Primary Care Physician:  Lynn [name], Lynn Jews.Lynn Sa, MD  CC: Headache  HPI: Lynn [name] is a 52 y.o. female here as a follow up. She is a former patient of Lynn. Leonie [name] and is transitioning to me. She has chronic daily headaches since June 2013 following intracerebral hemorrhage and treatment with chemotherapy for leukemia. She is still on Amitriptyline which works well. She is having shooting pain on the left(points to the occipital area). Happens a few days a week. Sharp, brief, throbbing, lightning bolt. It doesn't happen all day. It is a few times a week. Sesitive to the touch. She has tenderness a few hours a day in another spot where the drain was, feels like pressure, sore and tender to the touch on the right frontal area behind the hairline. The pain started this winter. 3-4 times a week. She has taken ibuprofen and it doesn't really help.   Depakote, Amitriptyline, imitrex  Interval history 03/20/2017:  She has dry eyes. She also has Sweet's syndrome. She follows with a specialist for her eyes and sees a dermatologist for the Sweet's syndrome. She does well. Discussed Amitriptyline can cause dry eyes, discuss with specialist. She is doing well otherwise. She has a disability hearing next month. She has a lot of fatigue and fevers. The dry eyes are exacerbating her migraines and headaches. She can;t drive home.   Interval history 02/04/2015: Headaches. She is having at 4 a week. They can last at least several hours . Lorazepam helps. Starts on the left, a dull pain, then spreads to the front, has nausea, no vomiting, here eyes are really dry and this is exacerbating the headaches, she has been to ophthalmology for her eyes. She is on restasis drops. Light is bothering her since her eyes are dry. She was doing well but within the last few monthe the headaches have returned. She prefers to try a new  medication instead of increasing Amitriptyline. She is feeling Depressed and having difficulty sleeping due to her medical conditions. She just labs completed and liver function was normal but creatinine is still elevated. Worse with laying down.   Lynn. Leonie [name] and Lynn [name] 09/15/2013: She returns for followup after her initial consultation with me on 04/09/12. She did not tolerate Topamax as it did not help and hence stopped it after a few weeks. She in fact develop worsening headache and was hospitalized at [facility] with headache and fever he had Topamax was discontinued and she was started on amitriptyline 10 mg at night. She was also given Phenergan for nausea seems to be working quite well. She also states that the chemotherapy dose has been reduced and that may have helped as the headaches have practically disappeared. She hasn't had headache only one day in the last 4 weeks. She did discontinue the Imitrex , oxycodone and Fioricet. 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 Lab work done on 04/09/12 showed normal ESR, ANA panel, B12, blood chemistries. TSH was slightly suppressed at 0.482miu/ml.   [name] for headache revisit, states her headaches have been manageable, except she had one severe headache in July with nausea, vomiting and diarrhea, tested positive for cdiff. Feeling better now. Tolerating Elavil at night. Has new complaint os tinging in medial thighs when she puts  chin to chest. No associated pain or numbness.  09/12/2012 (PS): She returns for followup after last visit on 06/06/12. She states she is doing well and has only occasional minor headaches off and on. She does take Imitrex which seems to help. She uses only once every 2 weeks or so. She continues to take amitriptyline 10 mg at night and seems to tolerate it well. She  has had trouble sleeping in recent weeks. She also has some minor sinus headaches her once a week which are not disabling.   12/13/12 (LL): Lynn [name] returns for 3 month revisit for headaches. She was recently hospitalized from 10/21/12 to 11/29/12 at [facility] for relapse of leukemia. She was found in hospital to have thin subdural hematomas along the falx, tentorium and cerebral convexities with a small volume of subarachnoid hemorrhage along the sulci of the temporal and frontal lobes. She had another round of chemotherapy and allograft bone marrow transplant; her blood counts are improving. She is not have many headaches, sometimes only sharp pain that is momentary. She is tolerating Elavil well but did not increase to 10 mg for fear of drowsiness. She takes Imitrex infrequently, but states it helps when she has to take it.    Social History   Socioeconomic History  . Marital status: Divorced    Spouse name: Not on file  . Number of children: 2  . Years of education: Associates  . Highest education level: Not on file  Social Needs  . Financial resource strain: Not on file  . Food insecurity - worry: Not on file  . Food insecurity - inability: Not on file  . Transportation needs - medical: Not on file  . Transportation needs - non-medical: Not on file  Occupational History  . Occupation: N/A  Tobacco Use  . Smoking status: Never Smoker  . Smokeless tobacco: Never Used  Substance and Sexual Activity  . Alcohol use: No  . Drug use: No  . Sexual activity: Not Currently    Birth control/protection: Abstinence, None  Other Topics Concern  . Not on file  Social History Narrative   Pt lives with her 2 children.    Associates degree   Right handed   Drinks 1 cup of caffeine daily    Family History  Problem Relation Age of Onset  . Pancreatic cancer Father   . Cancer Mother        colon  . Pancreatic cancer Mother   . Asthma Son   . Hypertension Sister   . Diabetes Neg Hx   . Heart  disease Neg Hx   . Breast cancer Neg Hx     Past Medical History:  Diagnosis Date  . ALL (acute lymphoblastic leukemia) ([place])   . Anemia   . Arthritis   . Brain bleed ([place])    07/04/11  . Diabetes mellitus without complication ([place])   . Headache(784.0)   . Hypertension   . Leukemia ([place])   . Pneumonia   . Pulmonary embolism ([place])    06/22/11  . Sweet's syndrome   . Thrush of mouth and esophagus ([place]) 04/09/2014    Past Surgical History:  Procedure Laterality Date  . BONE MARROW TRANSPLANT    . CESAREAN SECTION  2000  . CRANIOTOMY  07/04/11  . EYE SURGERY Right 2005   repair crossed eye  . FOOT SURGERY    . greenfield filter    . LUNG BIOPSY  06/19/13    Current Outpatient Medications  Medication  Sig Dispense Refill  . ACCU-CHEK AVIVA PLUS test strip USE TO TEST BLOOD GLUCOSE UP TO 2 TIMES DAILY 100 each 5  . acyclovir (ZOVIRAX) 800 MG tablet Take 1 tablet (800 mg total) by mouth 2 (two) times daily. . 60 tablet 5  . ADVAIR DISKUS 500-50 MCG/DOSE AEPB Inhale 1 puff into the lungs daily.    Marland Kitchen amitriptyline (ELAVIL) 10 MG tablet Take 2 tablets (20 mg total) by mouth at bedtime. 180 tablet 1  . cetirizine (ZYRTEC) 10 MG tablet Take 1 tablet (10 mg total) by mouth daily. (Patient taking differently: Take 10 mg by mouth daily as needed for allergies. ) 30 tablet 0  . Elastic Bandages & Supports (MEDICAL COMPRESSION SOCKS) MISC Please dispense one pair. 2 each 0  . fluconazole (DIFLUCAN) 200 MG tablet Take 200 mg by mouth 2 (two) times daily.     Marland Kitchen gabapentin (NEURONTIN) 300 MG capsule TAKE ONE CAPSULE BY MOUTH THREE TIMES DAILY 90 capsule 4  . IMBRUVICA 140 MG capsul Take 280 mg by mouth daily. Take 2 tablets once daily    . JANUMET XR 50-1000 MG TB24 TAKE 2 TABLETS BY MOUTH DAILY. 180 tablet 3  . KLOR-CON M20 20 MEQ tablet TAKE 1 TABLET (20 MEQ TOTAL) BY MOUTH DAILY. 30 tablet 2  . LORazepam (ATIVAN) 1 MG tablet Take 1 mg by mouth every 4 (four) hours as needed for anxiety.     .  Magnesium 250 MG TABS Take 250 mg by mouth daily.    . Multiple Vitamin (MULTIVITAMIN) tablet Take 1 tablet by mouth daily.    Marland Kitchen NOVOFINE PLUS 32G X 4 MM MISC     . NOVOLOG FLEXPEN 100 UNIT/ML FlexPen INJECT 16 UNITS WITH EACH MEAL PLUS 18-20 EXTRA UNITS IF SUGAR IS OVER 250 30 mL 3  . ondansetron (ZOFRAN ODT) 4 MG disintegrating tablet Take 1 tablet (4 mg total) by mouth every 8 (eight) hours as needed for nausea or vomiting. 20 tablet 0  . pravastatin (PRAVACHOL) 20 MG tablet TAKE 1 TABLET (20 MG TOTAL) BY MOUTH DAILY. 90 tablet 0  . predniSONE (DELTASONE) 5 MG tablet Take 10 mg by mouth daily with breakfast.     . Semaglutide (OZEMPIC) 0.25 or 0.5 MG/DOSE SOPN Inject 0.5 mg into the skin once a week. 1 pen 2  . sulfamethoxazole-trimethoprim (BACTRIM DS,SEPTRA DS) 800-160 MG per tablet Take 1 tablet by mouth 3 (three) times a week. Monday,Wednesday, and Friday 30 tablet 0  . mycophenolate (CELLCEPT) 250 MG capsule Take 250 mg 2 (two) times daily by mouth.      No current facility-administered medications for this visit.     Allergies as of 03/20/2017 - Review Complete 03/20/2017  Allergen Reaction Noted  . Tegaderm ag mesh [silver] Rash 07/03/2016    Vitals: BP 136/90 (BP Location: Right Arm, Patient Position: Sitting)   Pulse 100   Ht '5\' 7"'  (1.702 m)   Wt 244 lb (110.7 kg)   LMP 08/09/2011   BMI 38.22 kg/m  Last Weight:  Wt Readings from Last 1 Encounters:  03/20/17 244 lb (110.7 kg)   Last Height:   Ht Readings from Last 1 Encounters:  03/20/17 '5\' 7"'  (1.702 m)    Physical exam: Exam: Gen: NAD, conversant, well nourised, obese, well groomed                     CV: RRR, no MRG. No Carotid Bruits. No peripheral edema, warm, nontender Eyes: Conjunctivae  clear without exudates or hemorrhage  Neuro: Detailed Neurologic Exam  Speech:    Speech is normal; fluent and spontaneous with normal comprehension.  Cognition:    The patient is oriented to person, place, and time;      recent and remote memory intact;     language fluent;     normal attention, concentration,     fund of knowledge Cranial Nerves:    The pupils are equal, round, and reactive to light. The fundi are normal and spontaneous venous pulsations are present. Visual fields are full to finger confrontation. Extraocular movements are intact. Trigeminal sensation is intact and the muscles of mastication are normal. The face is symmetric. The palate elevates in the midline. Hearing intact. Voice is normal. Shoulder shrug is normal. The tongue has normal motion without fasciculations.   Coordination:    Normal finger to nose and heel to shin. Normal rapid alternating movements.   Gait:    Heel-toe and tandem gait are normal.   Motor Observation:    No asymmetry, no atrophy, and no involuntary movements noted. Tone:    Normal muscle tone.    Posture:    Posture is normal. normal erect    Strength:    Strength is V/V in the upper and lower limbs.      Sensation: intact to LT     Reflex Exam:  DTR's:    Deep tendon reflexes in the upper and lower extremities are normal bilaterally.   Toes:    The toes are downgoing bilaterally.   Clonus:    Clonus is absent.    Assessment/Plan: 52 year old lady with history of ALL with chronic headaches since June 2013 which seems to have improved with Amitriptyline now exacerbated and increasing with dry eyes. Exact etiology remains unclear but likely mixed transformed migraine and tension headaches with possible contribution from chemotherapy. ALL in remission.  PLAN:  - Continue amitriptyline 20 mg at night for headache prophylaxis since it seems to be tolerated and working well.  - May increase Amitrptyline to help with migraines and insomnia but need her to ask her eye specialist and make sure not causing dry eyes. May consider stopping it if specialist thinks it is contributing to dry eyes. She will contact us after discussing with  specialist.   Return for followup in 4-6 months or call earlier if necessary.     Sarina Ill, MD  [facility] [address] [place], [place] [address]  Phone [phone] Fax [phone]  A total of 15 minutes was spent face-to-face with this patient. Over half this time was spent on counseling patient on the chronic migraine diagnosis and different diagnostic and therapeutic options available.

## 2017-03-20 NOTE — Patient Instructions (Signed)
Email if you would like to increase Amitriptyline to 50mg   Amitriptyline tablets What is this medicine? AMITRIPTYLINE (a mee TRIP ti leen) is used to treat depression. This medicine may be used for other purposes; ask your health care provider or pharmacist if you have questions. COMMON BRAND NAME(S): Elavil, Vanatrip What should I tell my health care provider before I take this medicine? They need to know if you have any of these conditions: -an alcohol problem -asthma, difficulty breathing -bipolar disorder or schizophrenia -difficulty passing urine, prostate trouble -glaucoma -heart disease or previous heart attack -liver disease -over active thyroid -seizures -thoughts or plans of suicide, a previous suicide attempt, or family history of suicide attempt -an unusual or allergic reaction to amitriptyline, other medicines, foods, dyes, or preservatives -pregnant or trying to get pregnant -breast-feeding How should I use this medicine? Take this medicine by mouth with a drink of water. Follow the directions on the prescription label. You can take the tablets with or without food. Take your medicine at regular intervals. Do not take it more often than directed. Do not stop taking this medicine suddenly except upon the advice of your doctor. Stopping this medicine too quickly may cause serious side effects or your condition may worsen. A special MedGuide will be given to you by the pharmacist with each prescription and refill. Be sure to read this information carefully each time. Talk to your pediatrician regarding the use of this medicine in children. Special care may be needed. Overdosage: If you think you have taken too much of this medicine contact a poison control center or emergency room at once. NOTE: This medicine is only for you. Do not share this medicine with others. What if I miss a dose? If you miss a dose, take it as soon as you can. If it is almost time for your next dose,  take only that dose. Do not take double or extra doses. What may interact with this medicine? Do not take this medicine with any of the following medications: -arsenic trioxide -certain medicines used to regulate abnormal heartbeat or to treat other heart conditions -cisapride -droperidol -halofantrine -linezolid -MAOIs like Carbex, Eldepryl, Marplan, Nardil, and Parnate -methylene blue -other medicines for mental depression -phenothiazines like perphenazine, thioridazine and chlorpromazine -pimozide -probucol -procarbazine -sparfloxacin -St. John's Wort -ziprasidone This medicine may also interact with the following medications: -atropine and related drugs like hyoscyamine, scopolamine, tolterodine and others -barbiturate medicines for inducing sleep or treating seizures, like phenobarbital -cimetidine -disulfiram -ethchlorvynol -thyroid hormones such as levothyroxine This list may not describe all possible interactions. Give your health care provider a list of all the medicines, herbs, non-prescription drugs, or dietary supplements you use. Also tell them if you smoke, drink alcohol, or use illegal drugs. Some items may interact with your medicine. What should I watch for while using this medicine? Tell your doctor if your symptoms do not get better or if they get worse. Visit your doctor or health care professional for regular checks on your progress. Because it may take several weeks to see the full effects of this medicine, it is important to continue your treatment as prescribed by your doctor. Patients and their families should watch out for new or worsening thoughts of suicide or depression. Also watch out for sudden changes in feelings such as feeling anxious, agitated, panicky, irritable, hostile, aggressive, impulsive, severely restless, overly excited and hyperactive, or not being able to sleep. If this happens, especially at the beginning of treatment or after a  change in  dose, call your health care professional. Dennis Bast may get drowsy or dizzy. Do not drive, use machinery, or do anything that needs mental alertness until you know how this medicine affects you. Do not stand or sit up quickly, especially if you are an older patient. This reduces the risk of dizzy or fainting spells. Alcohol may interfere with the effect of this medicine. Avoid alcoholic drinks. Do not treat yourself for coughs, colds, or allergies without asking your doctor or health care professional for advice. Some ingredients can increase possible side effects. Your mouth may get dry. Chewing sugarless gum or sucking hard candy, and drinking plenty of water will help. Contact your doctor if the problem does not go away or is severe. This medicine may cause dry eyes and blurred vision. If you wear contact lenses you may feel some discomfort. Lubricating drops may help. See your eye doctor if the problem does not go away or is severe. This medicine can cause constipation. Try to have a bowel movement at least every 2 to 3 days. If you do not have a bowel movement for 3 days, call your doctor or health care professional. This medicine can make you more sensitive to the sun. Keep out of the sun. If you cannot avoid being in the sun, wear protective clothing and use sunscreen. Do not use sun lamps or tanning beds/booths. What side effects may I notice from receiving this medicine? Side effects that you should report to your doctor or health care professional as soon as possible: -allergic reactions like skin rash, itching or hives, swelling of the face, lips, or tongue -anxious -breathing problems -changes in vision -confusion -elevated mood, decreased need for sleep, racing thoughts, impulsive behavior -eye pain -fast, irregular heartbeat -feeling faint or lightheaded, falls -feeling agitated, angry, or irritable -fever with increased sweating -hallucination, loss of contact with  reality -seizures -stiff muscles -suicidal thoughts or other mood changes -tingling, pain, or numbness in the feet or hands -trouble passing urine or change in the amount of urine -trouble sleeping -unusually weak or tired -vomiting -yellowing of the eyes or skin Side effects that usually do not require medical attention (report to your doctor or health care professional if they continue or are bothersome): -change in sex drive or performance -change in appetite or weight -constipation -dizziness -dry mouth -nausea -tired -tremors -upset stomach This list may not describe all possible side effects. Call your doctor for medical advice about side effects. You may report side effects to FDA at 1-800-FDA-1088. Where should I keep my medicine? Keep out of the reach of children. Store at room temperature between 20 and 25 degrees C (68 and 77 degrees F). Throw away any unused medicine after the expiration date. NOTE: This sheet is a summary. It may not cover all possible information. If you have questions about this medicine, talk to your doctor, pharmacist, or health care provider.  2018 Elsevier/Gold Standard (2015-06-18 12:14:15)

## 2017-03-22 DIAGNOSIS — N390 Urinary tract infection, site not specified: Secondary | ICD-10-CM | POA: Diagnosis not present

## 2017-03-22 DIAGNOSIS — L982 Febrile neutrophilic dermatosis [Sweet]: Secondary | ICD-10-CM | POA: Diagnosis not present

## 2017-04-06 DIAGNOSIS — Z5181 Encounter for therapeutic drug level monitoring: Secondary | ICD-10-CM | POA: Diagnosis not present

## 2017-04-06 DIAGNOSIS — C9101 Acute lymphoblastic leukemia, in remission: Secondary | ICD-10-CM | POA: Diagnosis not present

## 2017-04-06 DIAGNOSIS — Z48298 Encounter for aftercare following other organ transplant: Secondary | ICD-10-CM | POA: Diagnosis not present

## 2017-04-06 DIAGNOSIS — D89811 Chronic graft-versus-host disease: Secondary | ICD-10-CM | POA: Diagnosis not present

## 2017-04-06 DIAGNOSIS — Z7952 Long term (current) use of systemic steroids: Secondary | ICD-10-CM | POA: Diagnosis not present

## 2017-04-06 DIAGNOSIS — Z9484 Stem cells transplant status: Secondary | ICD-10-CM | POA: Diagnosis not present

## 2017-04-06 DIAGNOSIS — R509 Fever, unspecified: Secondary | ICD-10-CM | POA: Diagnosis not present

## 2017-04-06 DIAGNOSIS — Z79899 Other long term (current) drug therapy: Secondary | ICD-10-CM | POA: Diagnosis not present

## 2017-04-16 ENCOUNTER — Other Ambulatory Visit (INDEPENDENT_AMBULATORY_CARE_PROVIDER_SITE_OTHER): Payer: Medicare Other

## 2017-04-16 DIAGNOSIS — E1165 Type 2 diabetes mellitus with hyperglycemia: Secondary | ICD-10-CM | POA: Diagnosis not present

## 2017-04-16 LAB — COMPREHENSIVE METABOLIC PANEL
ALT: 22 U/L (ref 0–35)
AST: 23 U/L (ref 0–37)
Albumin: 3.9 g/dL (ref 3.5–5.2)
Alkaline Phosphatase: 100 U/L (ref 39–117)
BILIRUBIN TOTAL: 0.3 mg/dL (ref 0.2–1.2)
BUN: 20 mg/dL (ref 6–23)
CHLORIDE: 102 meq/L (ref 96–112)
CO2: 27 meq/L (ref 19–32)
CREATININE: 1.24 mg/dL — AB (ref 0.40–1.20)
Calcium: 9.4 mg/dL (ref 8.4–10.5)
GFR: 58.28 mL/min — ABNORMAL LOW (ref >60.00)
GLUCOSE: 100 mg/dL — AB (ref 70–99)
Potassium: 4.8 mEq/L (ref 3.5–5.1)
SODIUM: 138 meq/L (ref 135–145)
Total Protein: 6.6 g/dL (ref 6.0–8.3)

## 2017-04-16 LAB — TSH: TSH: 0.78 u[IU]/mL (ref 0.35–4.50)

## 2017-04-17 LAB — FRUCTOSAMINE: FRUCTOSAMINE: 202 umol/L (ref 0–285)

## 2017-04-19 ENCOUNTER — Encounter: Payer: Self-pay | Admitting: Endocrinology

## 2017-04-19 ENCOUNTER — Ambulatory Visit (INDEPENDENT_AMBULATORY_CARE_PROVIDER_SITE_OTHER): Payer: Medicare Other | Admitting: Endocrinology

## 2017-04-19 VITALS — BP 108/84 | HR 91 | Wt 245.0 lb

## 2017-04-19 DIAGNOSIS — E1165 Type 2 diabetes mellitus with hyperglycemia: Secondary | ICD-10-CM

## 2017-04-19 DIAGNOSIS — E7800 Pure hypercholesterolemia, unspecified: Secondary | ICD-10-CM | POA: Diagnosis not present

## 2017-04-19 NOTE — Patient Instructions (Addendum)
Stop potassium   Call when out of janumet  Avoid a lot of carbs

## 2017-04-19 NOTE — Progress Notes (Signed)
Patient ID: Lynn [name], female   DOB: 1965/07/06, 52 y.o.   MRN: [id number]    Reason for Appointment: Endocrinology follow-up  Referring physician: Donnie Coffin  History of Present Illness:          DIABETES: Diagnosis: Type 2 diabetes mellitus, date of diagnosis: 2013        Past history: She weighed nearly 300 lb in 2013 around that time her diabetes was diagnosed Although her A1c at that time was reported at 7.4 she does not remember being told about diabetes and no treatment was given In 5/15 when she was getting steroids for her bone marrow transplant her blood sugar was over 400 She was started on insulin and had been taking a basal bolus insulin regimen subsequently while on prednisone Although she required small amounts of insulin she could not taper off insulin even when off prednisone  Recent history:    Currently oral hypoglycemic regimen: Ozempic 0.5 mg weekly, Janumet XR 50/1000    INSULIN regimen is NovoLog 8 units as needed with meals  She is now on Prednisone 9 mg   A1c is 6.6 as of 03/2017, has been as low  as 5.9 previously Fructosamine is 202  Current management, blood sugar patterns and problems:  Because of her difficulty losing weight and tendency to variable postprandial hypoglycemia she has been started on [place] since 2/19  She has worked up to 0.5 mg as directed and has had nausea only on the third day after the injection which is controlled by Zofran  She has continued to Janumet XR which she has a large supply of  Although she has not had any significant weight loss her recent blood sugars at home and her fructosamine indicate excellent control  She has done some readings in the evenings after meals and highest reading recently was 159 although she thinks occasionally if she goes off her diet it may be higher and she will take 10 units of NovoLog  She does not exercise because of fatigue and leg swelling  She tries to cut back  on her portions       Oral hypoglycemic drugs the patient is taking are: Janumet XR 2 a day     Side effects from medications have been: Diarrhea from Metformin over 1000 mg dose  Glucose monitoring:  done less than 1 time a day         Glucometer:  Accu-Chek   AVERAGE 118 with a range of 63-159, lowest reading at 3 PM and highest at 10 PM   Glycemic control:   Lab Results  Component Value Date   HGBA1C 6.6 (H) 03/02/2017   HGBA1C 6.3 11/23/2016   HGBA1C 5.9 08/24/2016   Lab Results  Component Value Date   MICROALBUR 1.3 05/08/2016   LDLCALC 90 11/23/2016   CREATININE 1.24 (H) 04/16/2017    Self-care: The diet that the patient has been following is: tries to limit meal size  Meals: 2 meals per day.  with breakfast at 11 am. Supper 5 pm   Exercise: walking a little at times Dietician visit: Most recent: 5/15 at the hospital              Compliance with the medical regimen: Fair  Weight history:  Wt Readings from Last 3 Encounters:  04/19/17 245 lb (111.1 kg)  03/20/17 244 lb (110.7 kg)  03/07/17 247 lb 9.6 oz (112.3 kg)    PROBLEM 2:  She is taking PREDNISONE  for GVHD and also for her pulmonary condition She has been tapering this off slowly and now taking 9 mg     Lab on 04/16/2017  Component Date Value Ref Range Status  . TSH 04/16/2017 0.78  0.35 - 4.50 uIU/mL Final  . Fructosamine 04/16/2017 202  0 - 285 umol/L Final   Comment: Published reference interval for apparently healthy subjects between age 53 and 33 is 87 - 285 umol/L and in a poorly controlled diabetic population is 228 - 563 umol/L with a mean of 396 umol/L.   Marland Kitchen Sodium 04/16/2017 138  135 - 145 mEq/L Final  . Potassium 04/16/2017 4.8  3.5 - 5.1 mEq/L Final  . Chloride 04/16/2017 102  96 - 112 mEq/L Final  . CO2 04/16/2017 27  19 - 32 mEq/L Final  . Glucose, Bld 04/16/2017 100* 70 - 99 mg/dL Final  . BUN 04/16/2017 20  6 - 23 mg/dL Final  . Creatinine, Ser 04/16/2017 1.24* 0.40 - 1.20  mg/dL Final  . Total Bilirubin 04/16/2017 0.3  0.2 - 1.2 mg/dL Final  . Alkaline Phosphatase 04/16/2017 100  39 - 117 U/L Final  . AST 04/16/2017 23  0 - 37 U/L Final  . ALT 04/16/2017 22  0 - 35 U/L Final  . Total Protein 04/16/2017 6.6  6.0 - 8.3 g/dL Final  . Albumin 04/16/2017 3.9  3.5 - 5.2 g/dL Final  . Calcium 04/16/2017 9.4  8.4 - 10.5 mg/dL Final  . GFR 04/16/2017 58.28* >60.00 mL/min Final    Allergies as of 04/19/2017      Reactions   Tegaderm Ag Mesh [silver] Rash      Medication List        Accurate as of 04/19/17  8:50 AM. Always use your most recent med list.          ACCU-CHEK AVIVA PLUS test strip Generic drug:  glucose blood USE TO TEST BLOOD GLUCOSE UP TO 2 TIMES DAILY   acyclovir 800 MG tablet Commonly known as:  ZOVIRAX Take 1 tablet (800 mg total) by mouth 2 (two) times daily. Marland Kitchen   ADVAIR DISKUS 500-50 MCG/DOSE Aepb Generic drug:  Fluticasone-Salmeterol Inhale 1 puff into the lungs daily.   amitriptyline 10 MG tablet Commonly known as:  ELAVIL Take 2 tablets (20 mg total) by mouth at bedtime.   cetirizine 10 MG tablet Commonly known as:  ZYRTEC Take 1 tablet (10 mg total) by mouth daily.   fluconazole 200 MG tablet Commonly known as:  DIFLUCAN Take 200 mg by mouth 2 (two) times daily.   gabapentin 300 MG capsule Commonly known as:  NEURONTIN TAKE ONE CAPSULE BY MOUTH THREE TIMES DAILY   IMBRUVICA 140 MG tablet Generic drug:  ibrutinib Take 140 mg by mouth daily. Take 2 tablets once daily   JANUMET XR 50-1000 MG Tb24 Generic drug:  SitaGLIPtin-MetFORMIN HCl TAKE 2 TABLETS BY MOUTH DAILY.   KLOR-CON M20 20 MEQ tablet Generic drug:  potassium chloride SA TAKE 1 TABLET (20 MEQ TOTAL) BY MOUTH DAILY.   LORazepam 1 MG tablet Commonly known as:  ATIVAN Take 1 mg by mouth every 4 (four) hours as needed for anxiety.   Magnesium 250 MG Tabs Take 250 mg by mouth daily.   Medical Compression Socks Misc Please dispense one pair.     multivitamin tablet Take 1 tablet by mouth daily.   mycophenolate 250 MG capsule Commonly known as:  CELLCEPT Take 250 mg 2 (two) times daily by mouth.  NOVOFINE PLUS 32G X 4 MM Misc Generic drug:  Insulin Pen Needle   NOVOLOG FLEXPEN 100 UNIT/ML FlexPen Generic drug:  insulin aspart INJECT 16 UNITS WITH EACH MEAL PLUS 18-20 EXTRA UNITS IF SUGAR IS OVER 250   ondansetron 4 MG disintegrating tablet Commonly known as:  ZOFRAN ODT Take 1 tablet (4 mg total) by mouth every 8 (eight) hours as needed for nausea or vomiting.   pravastatin 20 MG tablet Commonly known as:  PRAVACHOL TAKE 1 TABLET (20 MG TOTAL) BY MOUTH DAILY.   predniSONE 5 MG tablet Commonly known as:  DELTASONE Take 9 mg by mouth daily with breakfast.   Semaglutide 0.25 or 0.5 MG/DOSE Sopn Commonly known as:  OZEMPIC Inject 0.5 mg into the skin once a week.   sulfamethoxazole-trimethoprim 800-160 MG tablet Commonly known as:  BACTRIM DS,SEPTRA DS Take 1 tablet by mouth 3 (three) times a week. Monday,Wednesday, and Friday   SUMAtriptan 100 MG tablet Commonly known as:  IMITREX Take 1 tablet (100 mg total) by mouth once as needed for up to 1 dose. May repeat in 2 hours if headache persists or recurs.       Allergies:  Allergies  Allergen Reactions  . Tegaderm Ag Mesh [Silver] Rash    Past Medical History:  Diagnosis Date  . ALL (acute lymphoblastic leukemia) ([place])   . Anemia   . Arthritis   . Brain bleed ([place])    07/04/11  . Diabetes mellitus without complication ([place])   . Headache(784.0)   . Hypertension   . Leukemia ([place])   . Pneumonia   . Pulmonary embolism ([place])    06/22/11  . Sweet's syndrome   . Thrush of mouth and esophagus ([place]) 04/09/2014    Past Surgical History:  Procedure Laterality Date  . BONE MARROW TRANSPLANT    . CESAREAN SECTION  2000  . CRANIOTOMY  07/04/11  . EYE SURGERY Right 2005   repair crossed eye  . FOOT SURGERY    . greenfield filter    . LUNG BIOPSY  06/19/13     Family History  Problem Relation Age of Onset  . Pancreatic cancer Father   . Cancer Mother        colon  . Pancreatic cancer Mother   . Asthma Son   . Hypertension Sister   . Diabetes Neg Hx   . Heart disease Neg Hx   . Breast cancer Neg Hx     Social History:  reports that she has never smoked. She has never used smokeless tobacco. She reports that she does not drink alcohol or use drugs.    Review of Systems        Lipids: She tends to have high triglycerides and mildly increased LDL which is controlled She was started on pravastatin 20 mg daily in 9/16 With good results       Lab Results  Component Value Date   CHOL 171 11/23/2016   HDL 59.70 11/23/2016   LDLCALC 90 11/23/2016   LDLDIRECT 102.0 06/30/2016   TRIG 106.0 11/23/2016   CHOLHDL 3 11/23/2016     Not on antihypertensives  RENAL function appears to be improved and stable, reassured her that this is not a significant problem now   Lab Results  Component Value Date   CREATININE 1.24 (H) 04/16/2017   BUN 20 04/16/2017   NA 138 04/16/2017   K 4.8 04/16/2017   CL 102 04/16/2017   CO2 27 04/16/2017     She  is being followed at [facility] for her various problems including transplant rejection  Her liver functions have been normal mostly      Thyroid:   She had a thyroid biopsy in 6/14 for a left-sided thyroid nodule which was benign    Physical Examination:  BP 108/84 (BP Location: Left Arm, Patient Position: Sitting, Cuff Size: Large)   Pulse 91   Wt 245 lb (111.1 kg)   LMP 08/09/2011   SpO2 98%   BMI 38.37 kg/m       ASSESSMENT:  Diabetes type 2, with obesity  See history of present illness for detailed discussion of  current management, blood sugar patterns and problems identified  With starting Ozempic as above her blood sugars are excellent and mostly near normal Fructosamine of 202 also indicates excellent control recently She has not required insulin most of the time  despite continuing 9 mg of prednisone However still has not lost weight and is not able to exercise quite as yet, previously had been continuing to gain weight She is only having transient mild nausea and discussed that this may improve over time with continued use of the Ozempic  PLAN:  Continue Ozempic 0.5 mg weekly  For her history of hypokalemia since she is not on any diuretics she can stop taking potassium supplements and excessive high potassium high carbohydrate foods especially fruits   There are no Patient Instructions on file for this visit.  Elayne Snare 04/19/2017, 8:50 AM   Note: This office note was prepared with Dragon voice recognition system technology. Any transcriptional errors that result from this process are unintentional.

## 2017-05-01 ENCOUNTER — Other Ambulatory Visit: Payer: Self-pay | Admitting: Endocrinology

## 2017-05-21 DIAGNOSIS — Z9889 Other specified postprocedural states: Secondary | ICD-10-CM | POA: Diagnosis not present

## 2017-05-21 DIAGNOSIS — D89811 Chronic graft-versus-host disease: Secondary | ICD-10-CM | POA: Diagnosis not present

## 2017-05-21 DIAGNOSIS — H04123 Dry eye syndrome of bilateral lacrimal glands: Secondary | ICD-10-CM | POA: Diagnosis not present

## 2017-07-09 ENCOUNTER — Other Ambulatory Visit: Payer: Self-pay | Admitting: Endocrinology

## 2017-07-20 ENCOUNTER — Other Ambulatory Visit: Payer: Medicare Other

## 2017-07-24 ENCOUNTER — Ambulatory Visit: Payer: Medicare Other | Admitting: Endocrinology

## 2017-07-27 ENCOUNTER — Other Ambulatory Visit: Payer: Self-pay | Admitting: Endocrinology

## 2017-08-20 ENCOUNTER — Telehealth: Payer: Self-pay | Admitting: Neurology

## 2017-08-20 MED ORDER — AMITRIPTYLINE HCL 10 MG PO TABS: 20.0000 mg | ORAL_TABLET | Freq: Every day | ORAL | 1 refills | Status: DC

## 2017-08-20 NOTE — Telephone Encounter (Signed)
Refill sent to [facility]  

## 2017-08-20 NOTE — Telephone Encounter (Signed)
Pt requesting a new prescription for amitriptyline (ELAVIL) 10 MG tablet sent to [facility]

## 2017-10-25 ENCOUNTER — Other Ambulatory Visit: Payer: Self-pay

## 2017-10-25 ENCOUNTER — Emergency Department (HOSPITAL_COMMUNITY)
Admission: EM | Admit: 2017-10-25 | Discharge: 2017-10-25 | Disposition: A | Discharge status: Other Acute Inpatient Hospital | Payer: Self-pay | Attending: Emergency Medicine | Admitting: Emergency Medicine

## 2017-10-25 ENCOUNTER — Encounter (HOSPITAL_COMMUNITY): Payer: Self-pay

## 2017-10-25 DIAGNOSIS — C9501 Acute leukemia of unspecified cell type, in remission: Secondary | ICD-10-CM | POA: Insufficient documentation

## 2017-10-25 DIAGNOSIS — E1122 Type 2 diabetes mellitus with diabetic chronic kidney disease: Secondary | ICD-10-CM | POA: Insufficient documentation

## 2017-10-25 DIAGNOSIS — L982 Febrile neutrophilic dermatosis [Sweet]: Secondary | ICD-10-CM | POA: Insufficient documentation

## 2017-10-25 DIAGNOSIS — N183 Chronic kidney disease, stage 3 (moderate): Secondary | ICD-10-CM | POA: Insufficient documentation

## 2017-10-25 DIAGNOSIS — R21 Rash and other nonspecific skin eruption: Secondary | ICD-10-CM

## 2017-10-25 DIAGNOSIS — I129 Hypertensive chronic kidney disease with stage 1 through stage 4 chronic kidney disease, or unspecified chronic kidney disease: Secondary | ICD-10-CM | POA: Insufficient documentation

## 2017-10-25 DIAGNOSIS — Z86711 Personal history of pulmonary embolism: Secondary | ICD-10-CM | POA: Insufficient documentation

## 2017-10-25 DIAGNOSIS — Z79899 Other long term (current) drug therapy: Secondary | ICD-10-CM | POA: Insufficient documentation

## 2017-10-25 DIAGNOSIS — K1379 Other lesions of oral mucosa: Secondary | ICD-10-CM | POA: Insufficient documentation

## 2017-10-25 LAB — CBC WITH DIFFERENTIAL/PLATELET
Basophils Absolute: 0 10*3/uL (ref 0.0–0.1)
Basophils Relative: 1 %
EOS PCT: 12 %
Eosinophils Absolute: 0.9 10*3/uL — ABNORMAL HIGH (ref 0.0–0.7)
HCT: 38.4 % (ref 36.0–46.0)
Hemoglobin: 12.5 g/dL (ref 12.0–15.0)
LYMPHS ABS: 2.2 10*3/uL (ref 0.7–4.0)
LYMPHS PCT: 29 %
MCH: 31.2 pg (ref 26.0–34.0)
MCHC: 32.6 g/dL (ref 30.0–36.0)
MCV: 95.8 fL (ref 78.0–100.0)
MONO ABS: 0.9 10*3/uL (ref 0.1–1.0)
MONOS PCT: 12 %
Neutro Abs: 3.5 10*3/uL (ref 1.7–7.7)
Neutrophils Relative %: 46 %
PLATELETS: 426 10*3/uL — AB (ref 150–400)
RBC: 4.01 MIL/uL (ref 3.87–5.11)
RDW: 14.9 % (ref 11.5–15.5)
WBC: 7.6 10*3/uL (ref 4.0–10.5)

## 2017-10-25 LAB — COMPREHENSIVE METABOLIC PANEL
ALT: 36 U/L (ref 0–44)
ANION GAP: 8 (ref 5–15)
AST: 58 U/L — ABNORMAL HIGH (ref 15–41)
Albumin: 3.4 g/dL — ABNORMAL LOW (ref 3.5–5.0)
Alkaline Phosphatase: 107 U/L (ref 38–126)
BUN: 12 mg/dL (ref 6–20)
CO2: 25 mmol/L (ref 22–32)
CREATININE: 1.23 mg/dL — AB (ref 0.44–1.00)
Calcium: 8.8 mg/dL — ABNORMAL LOW (ref 8.9–10.3)
Chloride: 110 mmol/L (ref 98–111)
GFR, EST AFRICAN AMERICAN: 57 mL/min — AB (ref >60)
GFR, EST NON AFRICAN AMERICAN: 49 mL/min — AB (ref >60)
GLUCOSE: 105 mg/dL — AB (ref 70–99)
Potassium: 3.7 mmol/L (ref 3.5–5.1)
Sodium: 143 mmol/L (ref 135–145)
TOTAL PROTEIN: 6.4 g/dL — AB (ref 6.5–8.1)
Total Bilirubin: 0.4 mg/dL (ref 0.3–1.2)

## 2017-10-25 LAB — C-REACTIVE PROTEIN: CRP: <0.8 mg/dL (ref <1.0)

## 2017-10-25 LAB — SEDIMENTATION RATE: Sed Rate: 26 mm/hr — ABNORMAL HIGH (ref 0–22)

## 2017-10-25 MED ORDER — LIDOCAINE HCL URETHRAL/MUCOSAL 2 % EX GEL
1.0000 "application " | Freq: Once | CUTANEOUS | Status: AC
  Administered 2017-10-25: 1 via TOPICAL
  Filled 2017-10-25: qty 5

## 2017-10-25 NOTE — ED Triage Notes (Signed)
Patient reports that she has had trouble swallowing solid foods and saw sores inside her mouth x 1 week.

## 2017-10-25 NOTE — ED Notes (Addendum)
Pt has hx of leukemia. Pt had 2 bone marrow transplants(2013,2015). Pt reports that last chemo was in June of this year. Pt has hx of SWEET syndrome. Pt reports for the last week she has had blisters in mouth and has had difficulty swallowing solid food. Pt reports fevers occasionally.

## 2017-10-25 NOTE — ED Notes (Signed)
Pt gave verbal consent for transfer.  

## 2017-10-25 NOTE — ED Notes (Signed)
Report given air care. [facility]

## 2017-10-25 NOTE — ED Notes (Signed)
ED Provider at bedside. 

## 2017-10-25 NOTE — ED Provider Notes (Addendum)
[facility] DEPT Provider Note   CSN: [id number] Arrival date & time: 10/25/17  4970     History   Chief Complaint Chief Complaint  Patient presents with  . trouble swallowing  . sores inside mouth    HPI Lynn [name] is a 52 y.o. female.  HPI  52 year old woman with history of a LL, currently in remission along with diabetes comes in with chief complaint of sores inside the mouth with trouble swallowing and pain.  Patient is not on any immunosuppressive agents.  She reports that she started developing sores to her mouth about a week ago.  Soon after she started having pain with swallowing.  Patient also has been having subjective fevers with T-max of 101 yesterday.  Because patient has chronic pain she has been taking ibuprofen as well.  Upon further questioning patient reports that she has had cutaneous blisters in the past, they were diagnosed as Sweet syndrome with biopsy.  Patient has been cancer free for over 6 months, she is no longer on CellCept and her prednisone was discontinued the summer.  Past Medical History:  Diagnosis Date  . ALL (acute lymphoblastic leukemia) ([place])   . Anemia   . Arthritis   . Brain bleed ([place])    07/04/11  . Diabetes mellitus without complication ([place])   . Headache(784.0)   . Hypertension   . Leukemia ([place])   . Pneumonia   . Pulmonary embolism ([place])    06/22/11  . Sweet's syndrome   . Thrush of mouth and esophagus ([place]) 04/09/2014    Patient Active Problem List   Diagnosis Date Noted  . CKD (chronic kidney disease), stage III ([place]) 06/03/2016  . Headache 11/16/2015  . Blurry vision, bilateral 10/07/2015  . Chronic headaches 05/06/2015  . Dehydration 05/21/2014  . Prerenal renal failure 05/21/2014  . Thrush of mouth and esophagus ([place]) 04/09/2014  . Chronic graft-versus-host disease ([place]) 04/09/2014  . Leukopenia 01/08/2014  . S/P allogeneic bone marrow transplant ([place]) 01/08/2014  . Asthma,  chronic 12/13/2013  . Pulmonary infiltrates 12/13/2013  . Cryptogenic organizing pneumonia 08/27/2013  . BOOP (bronchiolitis obliterans with organizing pneumonia) ([place]) 08/08/2013  . Insulin dependent type 2 diabetes mellitus, uncontrolled ([place]) 07/30/2013  . HCAP (healthcare-associated pneumonia) 06/10/2013  . History of pulmonary embolism 01/11/2013  . Greenfield filter in place 01/11/2013  . Port-A-Cath in place 01/09/2013  . Right thyroid nodule 07/09/2012  . Fever 05/13/2012  . History of peripheral stem cell transplant ([place]) 01/12/2012  . Transfusion reaction 11/27/2011  . Acute lymphoblastic leukemia in remission ([place]) 08/21/2011  . Subdural hemorrhage ([place]) 07/26/2011  . Essential hypertension 07/06/2011  . Thrombocytopenia ([place]) 06/17/2011  . Anemia in neoplastic disease 06/17/2011  . [place] lymphadenopathy 06/17/2011    Past Surgical History:  Procedure Laterality Date  . BONE MARROW TRANSPLANT    . CESAREAN SECTION  2000  . CRANIOTOMY  07/04/11  . EYE SURGERY Right 2005   repair crossed eye  . FOOT SURGERY    . greenfield filter    . LUNG BIOPSY  06/19/13     OB History    Gravida  2   Para  2   Term  2   Preterm      AB      Living  2     SAB      TAB      Ectopic      Multiple      Live Births  Home Medications    Prior to Admission medications   Medication Sig Start Date End Date Taking? Authorizing Provider  acetaminophen (TYLENOL) 500 MG tablet Take 1,000 mg by mouth every 6 (six) hours as needed for moderate pain.   Yes [provider]  acyclovir (ZOVIRAX) 800 MG tablet Take 1 tablet (800 mg total) by mouth 2 (two) times daily. . 08/27/13  Yes Chism, David, MD  ADVAIR DISKUS 500-50 MCG/DOSE AEPB Inhale 1 puff into the lungs daily as needed (shortness of breath).  05/29/16  Yes [provider]  amitriptyline (ELAVIL) 10 MG tablet Take 2 tablets (20 mg total) by mouth at bedtime. 08/20/17  Yes Melvenia Beam, MD  cetirizine (ZYRTEC) 10 MG tablet Take 1 tablet (10 mg total) by mouth daily. Patient taking differently: Take 10 mg by mouth daily as needed for allergies.  03/07/14  Yes Melony Overly, MD  gabapentin (NEURONTIN) 300 MG capsule TAKE ONE CAPSULE BY MOUTH THREE TIMES DAILY Patient taking differently: Take 300 mg by mouth daily as needed (pain).  02/06/17  Yes Dennie Bible, NP  ibuprofen (ADVIL,MOTRIN) 200 MG tablet Take 600-800 mg by mouth every 6 (six) hours as needed for moderate pain.   Yes [provider]  KLOR-CON M20 20 MEQ tablet TAKE 1 TABLET (20 MEQ TOTAL) BY MOUTH DAILY. Patient taking differently: Take 20 mEq by mouth at bedtime.  05/01/17  Yes Elayne Snare, MD  LORazepam (ATIVAN) 1 MG tablet Take 1 mg by mouth every 4 (four) hours as needed for anxiety.  02/06/14  Yes [provider]  Magnesium 250 MG TABS Take 250 mg by mouth daily.   Yes [provider]  Multiple Vitamin (MULTIVITAMIN) tablet Take 1 tablet by mouth daily.   Yes [provider]  ondansetron (ZOFRAN ODT) 4 MG disintegrating tablet Take 1 tablet (4 mg total) by mouth every 8 (eight) hours as needed for nausea or vomiting. 04/29/16  Yes , , MD  sulfamethoxazole-trimethoprim (BACTRIM DS,SEPTRA DS) 800-160 MG per tablet Take 1 tablet by mouth 3 (three) times a week. Monday,Wednesday, and Friday 06/19/14  Yes Gorsuch, Ernst Spell, MD  SUMAtriptan (IMITREX) 100 MG tablet Take 1 tablet (100 mg total) by mouth once as needed for up to 1 dose. May repeat in 2 hours if headache persists or recurs. Patient taking differently: Take 100 mg by mouth at bedtime. May repeat in 2 hours if headache persists or recurs. 03/20/17  Yes Melvenia Beam, MD  ACCU-CHEK AVIVA PLUS test strip USE TO TEST BLOOD GLUCOSE UP TO 2 TIMES DAILY 10/12/16   Elayne Snare, MD  Elastic Bandages & Supports (MEDICAL COMPRESSION SOCKS) MISC Please dispense one pair. 05/13/15   Etta Quill, NP  NOVOFINE PLUS 32G X 4 MM  MISC  05/31/14   [provider]  NOVOLOG FLEXPEN 100 UNIT/ML FlexPen INJECT 16 UNITS WITH EACH MEAL PLUS 18-20 EXTRA UNITS IF SUGAR IS OVER 250 Patient not taking: Reported on 10/25/2017 09/06/15   Elayne Snare, MD  OZEMPIC 0.25 or 0.5 MG/DOSE SOPN INJECT 0.5 MG INTO THE SKIN ONCE A WEEK. Patient not taking: Reported on 10/25/2017 07/29/17   Elayne Snare, MD  pravastatin (PRAVACHOL) 20 MG tablet TAKE 1 TABLET (20 MG TOTAL) BY MOUTH DAILY. Patient not taking: Reported on 10/25/2017 07/09/17   Elayne Snare, MD    Family History Family History  Problem Relation Age of Onset  . Pancreatic cancer Father   . Cancer Mother  colon  . Pancreatic cancer Mother   . Asthma Son   . Hypertension Sister   . Diabetes Neg Hx   . Heart disease Neg Hx   . Breast cancer Neg Hx     Social History Social History   Tobacco Use  . Smoking status: Never Smoker  . Smokeless tobacco: Never Used  Substance Use Topics  . Alcohol use: No  . Drug use: No     Allergies   Tegaderm ag mesh [silver]   Review of Systems Review of Systems  Constitutional: Positive for activity change and fever.  HENT: Positive for sore throat.   Eyes: Negative for pain and visual disturbance.  Respiratory: Negative for shortness of breath.   Cardiovascular: Negative for chest pain.  Gastrointestinal: Negative for nausea and vomiting.  Skin: Positive for rash and wound.  Allergic/Immunologic: Negative for immunocompromised state.  Hematological: Does not bruise/bleed easily.     Physical Exam Updated Vital Signs BP 137/84 (BP Location: Left Arm)   Pulse 84   Temp 98.4 F (36.9 C) (Oral)   Resp 18   Ht 5\' 6"  (1.676 m)   Wt 108.9 kg   LMP 08/09/2011   SpO2 94%   BMI 38.74 kg/m   Physical Exam  Constitutional: She is oriented to person, place, and time. She appears well-developed.  HENT:  Head: Normocephalic and atraumatic.  Patient has oral plaques, some of them are hyperpigmented other ones are  erythematous.  No active bleeding appreciated.  Lesions are located over the hard palate and proximal tongue.  Eyes: EOM are normal.  Neck: Normal range of motion. Neck supple.  Cardiovascular: Normal rate.  Pulmonary/Chest: Effort normal.  Abdominal: Bowel sounds are normal.  Neurological: She is alert and oriented to person, place, and time.  Skin: Skin is warm and dry.  Nursing note and vitals reviewed.    ED Treatments / Results  Labs (all labs ordered are listed, but only abnormal results are displayed) Labs Reviewed  CBC WITH DIFFERENTIAL/PLATELET - Abnormal; Notable for the following components:      Result Value   Platelets 426 (*)    Eosinophils Absolute 0.9 (*)    All other components within normal limits  COMPREHENSIVE METABOLIC PANEL - Abnormal; Notable for the following components:   Glucose, Bld 105 (*)    Creatinine, Ser 1.23 (*)    Calcium 8.8 (*)    Total Protein 6.4 (*)    Albumin 3.4 (*)    AST 58 (*)    GFR calc non Af Amer 49 (*)    GFR calc Af Amer 57 (*)    All other components within normal limits  SEDIMENTATION RATE - Abnormal; Notable for the following components:   Sed Rate 26 (*)    All other components within normal limits  C-REACTIVE PROTEIN    EKG None  Radiology No results found.  Procedures Procedures (including critical care time)  CRITICAL CARE Performed by:     Total critical care time: 59 minutes  Critical care time was exclusive of separately billable procedures and treating other patients.  Critical care was necessary to treat or prevent imminent or life-threatening deterioration.  Critical care was time spent personally by me on the following activities: development of treatment plan with patient and/or surrogate as well as nursing, discussions with consultants, evaluation of patient's response to treatment, examination of patient, obtaining history from patient or surrogate, ordering and performing  treatments and interventions, ordering and review of laboratory studies,  ordering and review of radiographic studies, pulse oximetry and re-evaluation of patient's condition.   Medications Ordered in ED Medications  lidocaine (XYLOCAINE) 2 % jelly 1 application (1 application Topical Given 10/25/17 1129)     Initial Impression / Assessment and Plan / ED Course  I have reviewed the triage vital signs and the nursing notes.  Pertinent labs & imaging results that were available during my care of the patient were reviewed by me and considered in my medical decision making (see chart for details).     52 year old female comes in with chief complaint of oral lesions and pain. She is cancer free at the moment and not immunocompromised.  No recent antibiotics.  She is coming in with oral lesions that are concerning for either sweet syndrome or fungal infection.  Given that patient does have history of biopsy confirmed sweet syndrome, suspicion for that condition is higher.  We will get labs to see if they are consistent with the diagnosis.. Topical lidocaine given for now.  Late entry:  Extensive discussion with the dermatologist at [facility] health. It is noted that patient has been taking Bactrim for a long time.  These nonspecific lesions could be because of Stevens-Johnson syndrome along with atypical presentation of sweet syndrome. Patient has known ocular issues, which confounds the diagnosis right now.  [facility] is excepting the patient to the ED for further care.  Final Clinical Impressions(s) / ED Diagnoses   Final diagnoses:  Rash    ED Discharge Orders    None          Varney Biles, MD 10/26/17 1659

## 2017-10-26 IMAGING — CR DG CHEST 2V
2 series · 2 of 2 positions shown · non-contrast
Comparison: 10/07/2015

CLINICAL DATA: Several day history of fever.

EXAM:
CHEST  2 VIEW

[w chest pa]
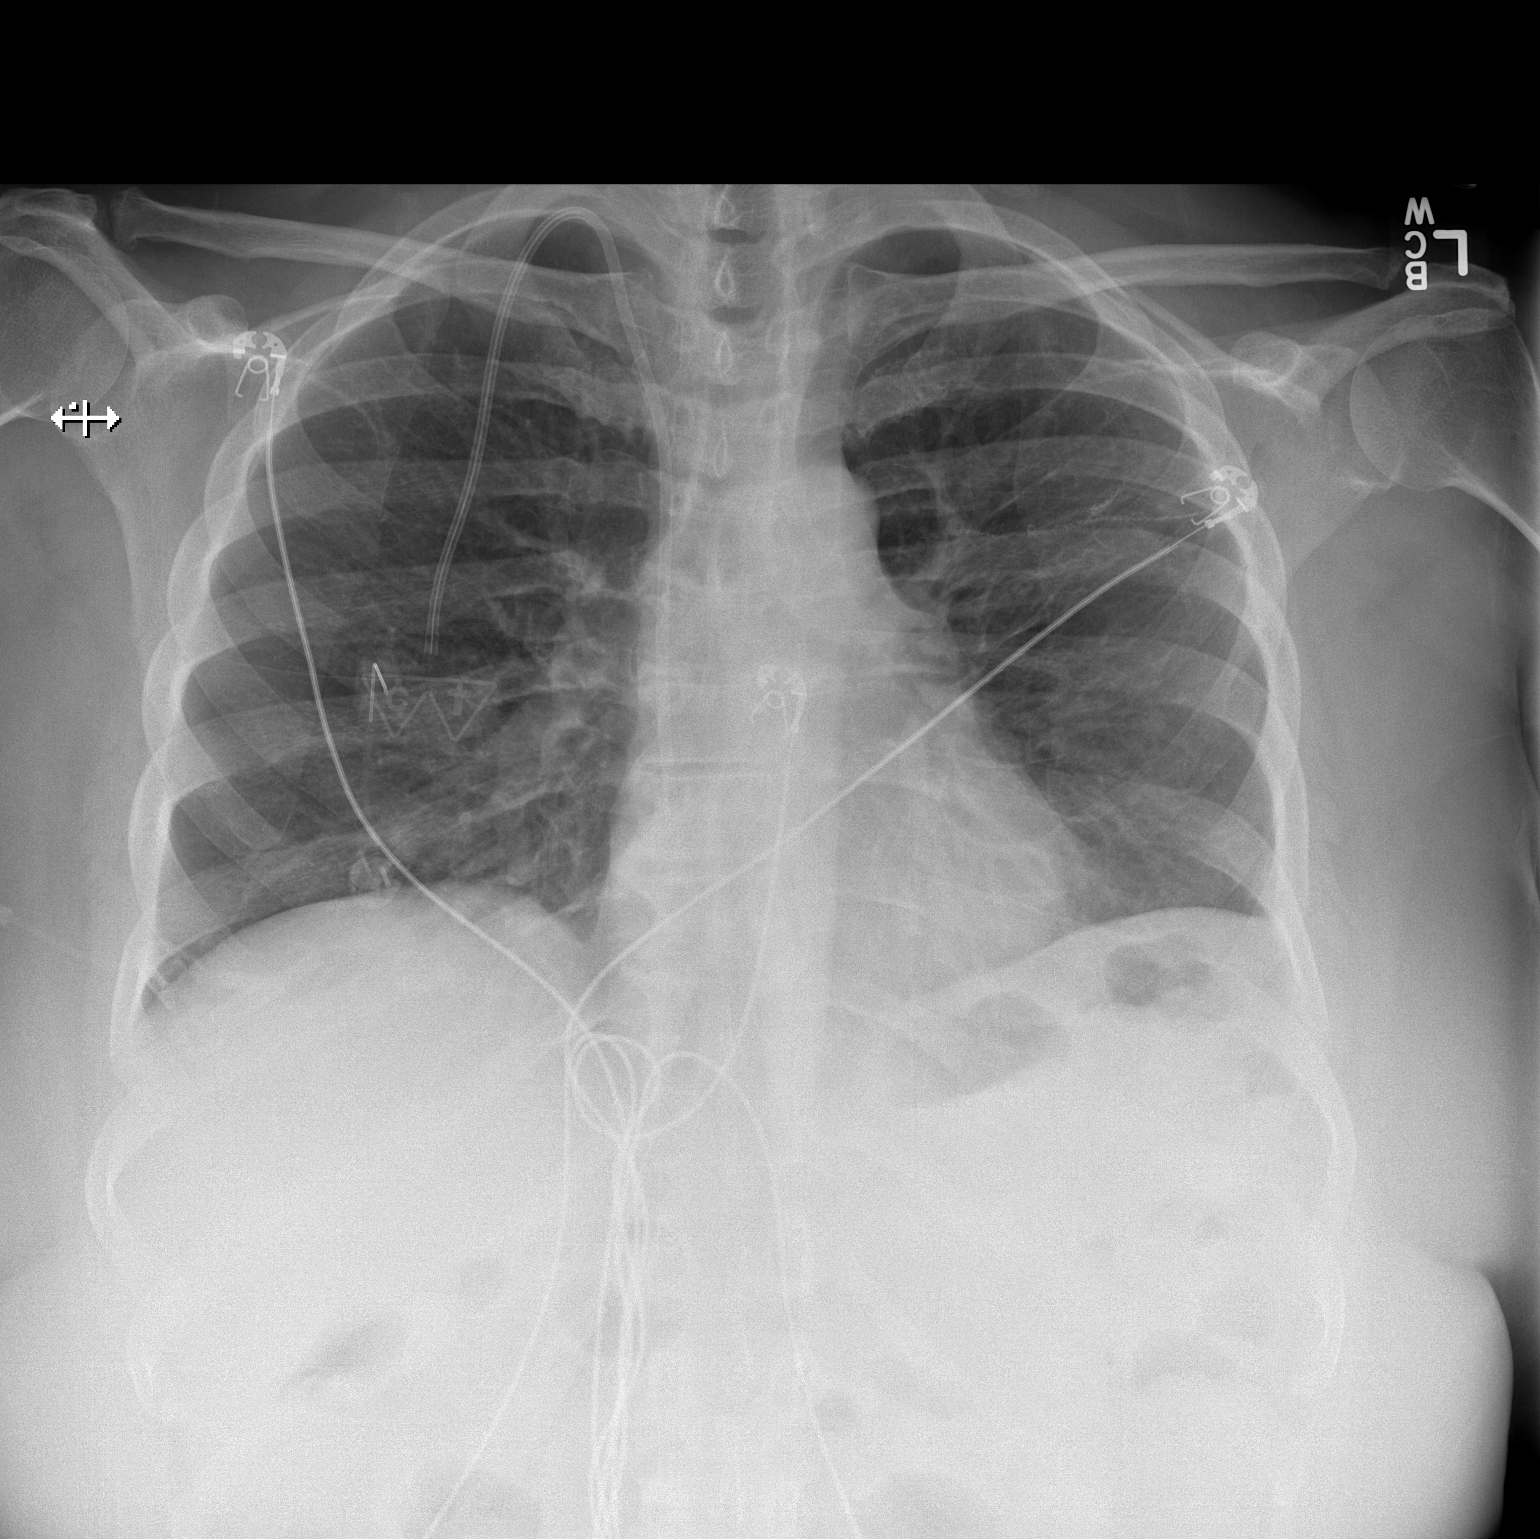

[w chest lat]
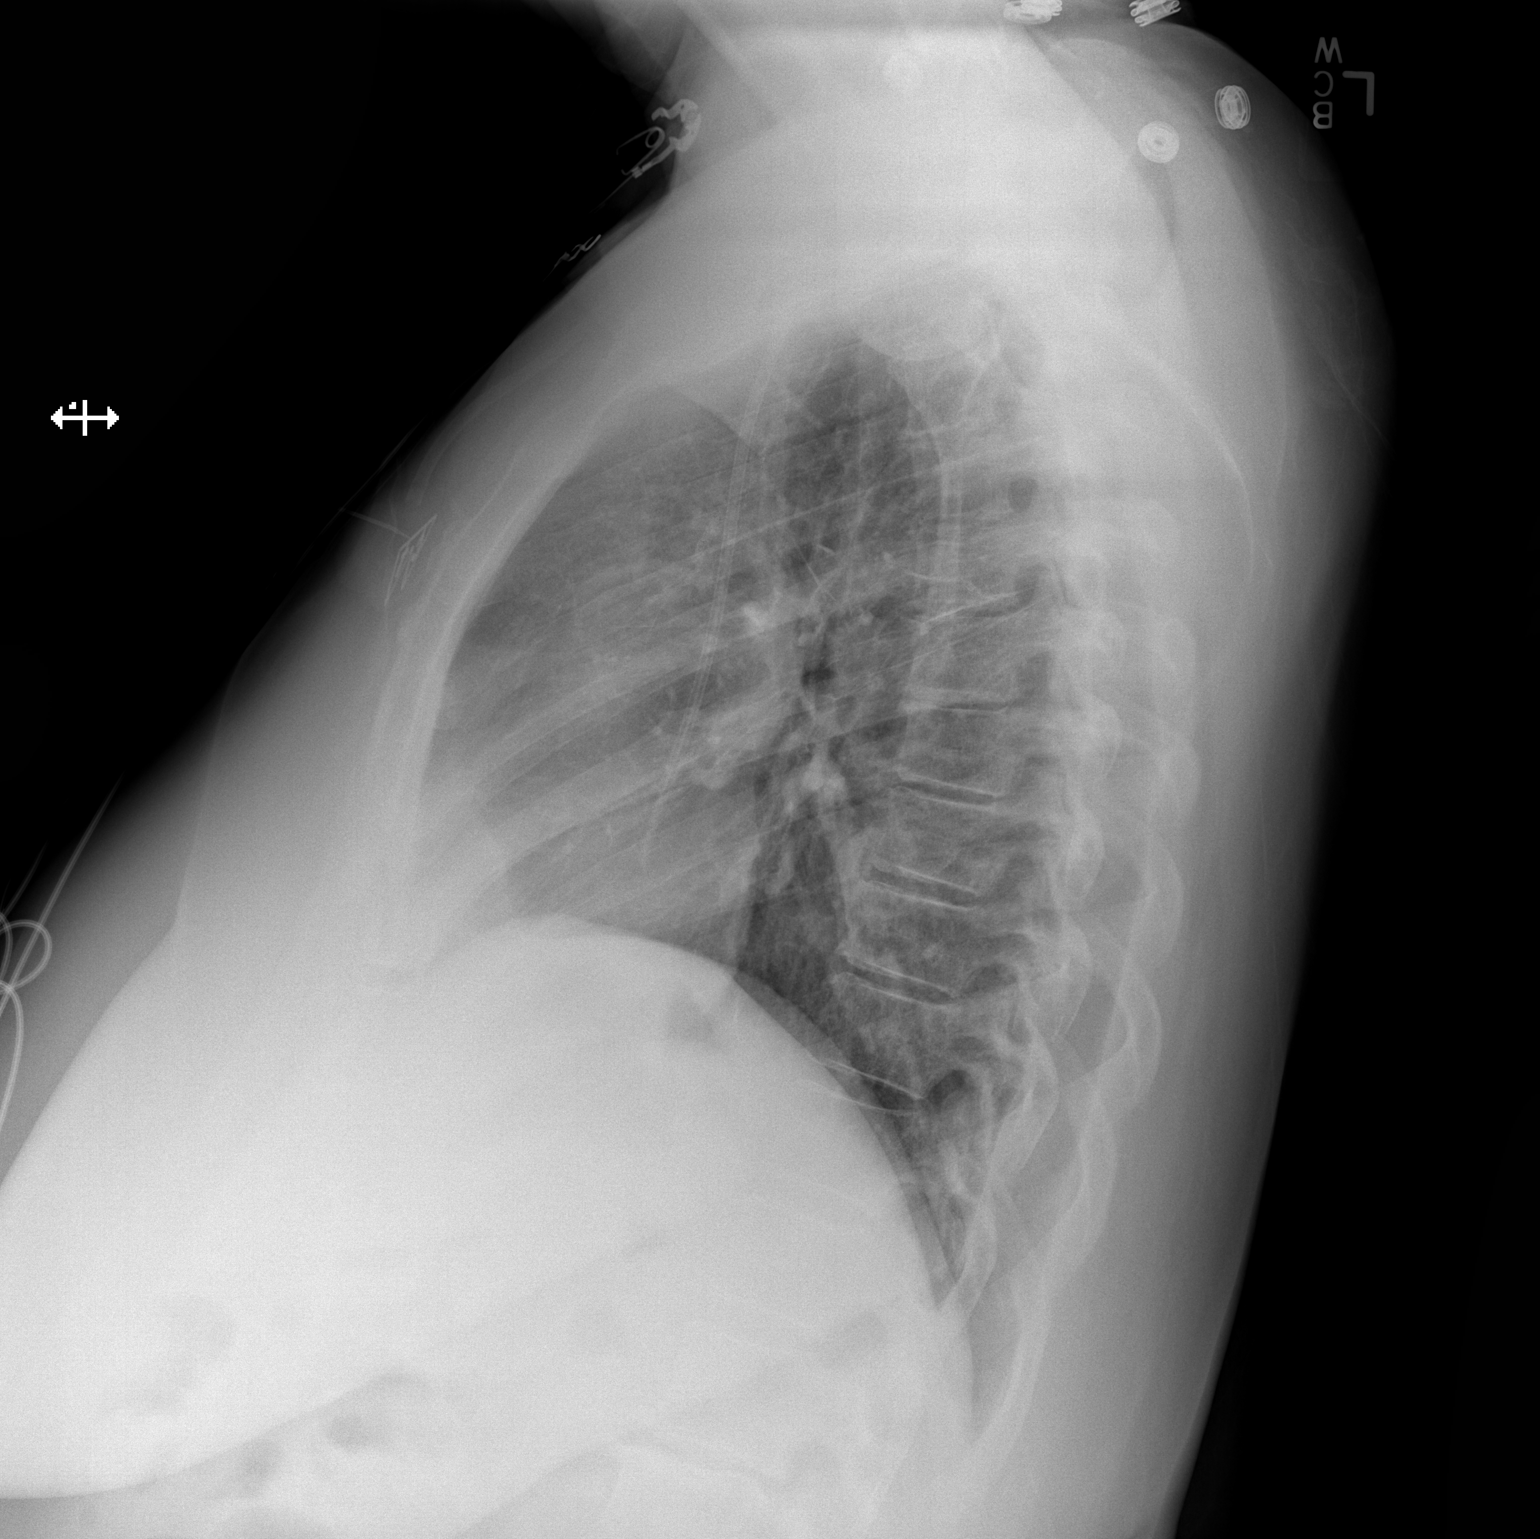

[2 of 2 positions shown; findings below may reference images not displayed]

FINDINGS: Suture line visible left mid lung. No focal airspace consolidation
or pulmonary edema. No pleural effusion. The cardiopericardial
silhouette is within normal limits for size. Right Port-A-Cath
remains in place. Telemetry leads overlie the chest.
IMPRESSION: No active cardiopulmonary disease.

## 2018-03-12 ENCOUNTER — Other Ambulatory Visit: Payer: Self-pay | Admitting: Neurology

## 2018-03-14 ENCOUNTER — Other Ambulatory Visit: Payer: Self-pay | Admitting: Neurology

## 2018-06-20 IMAGING — CR DG CHEST 2V
2 series · 2 of 2 positions shown · non-contrast
Comparison: 10/10/2015 chest radiograph.

CLINICAL DATA: Fever.  Leukemia.

EXAM:
CHEST  2 VIEW

[w chest lat]
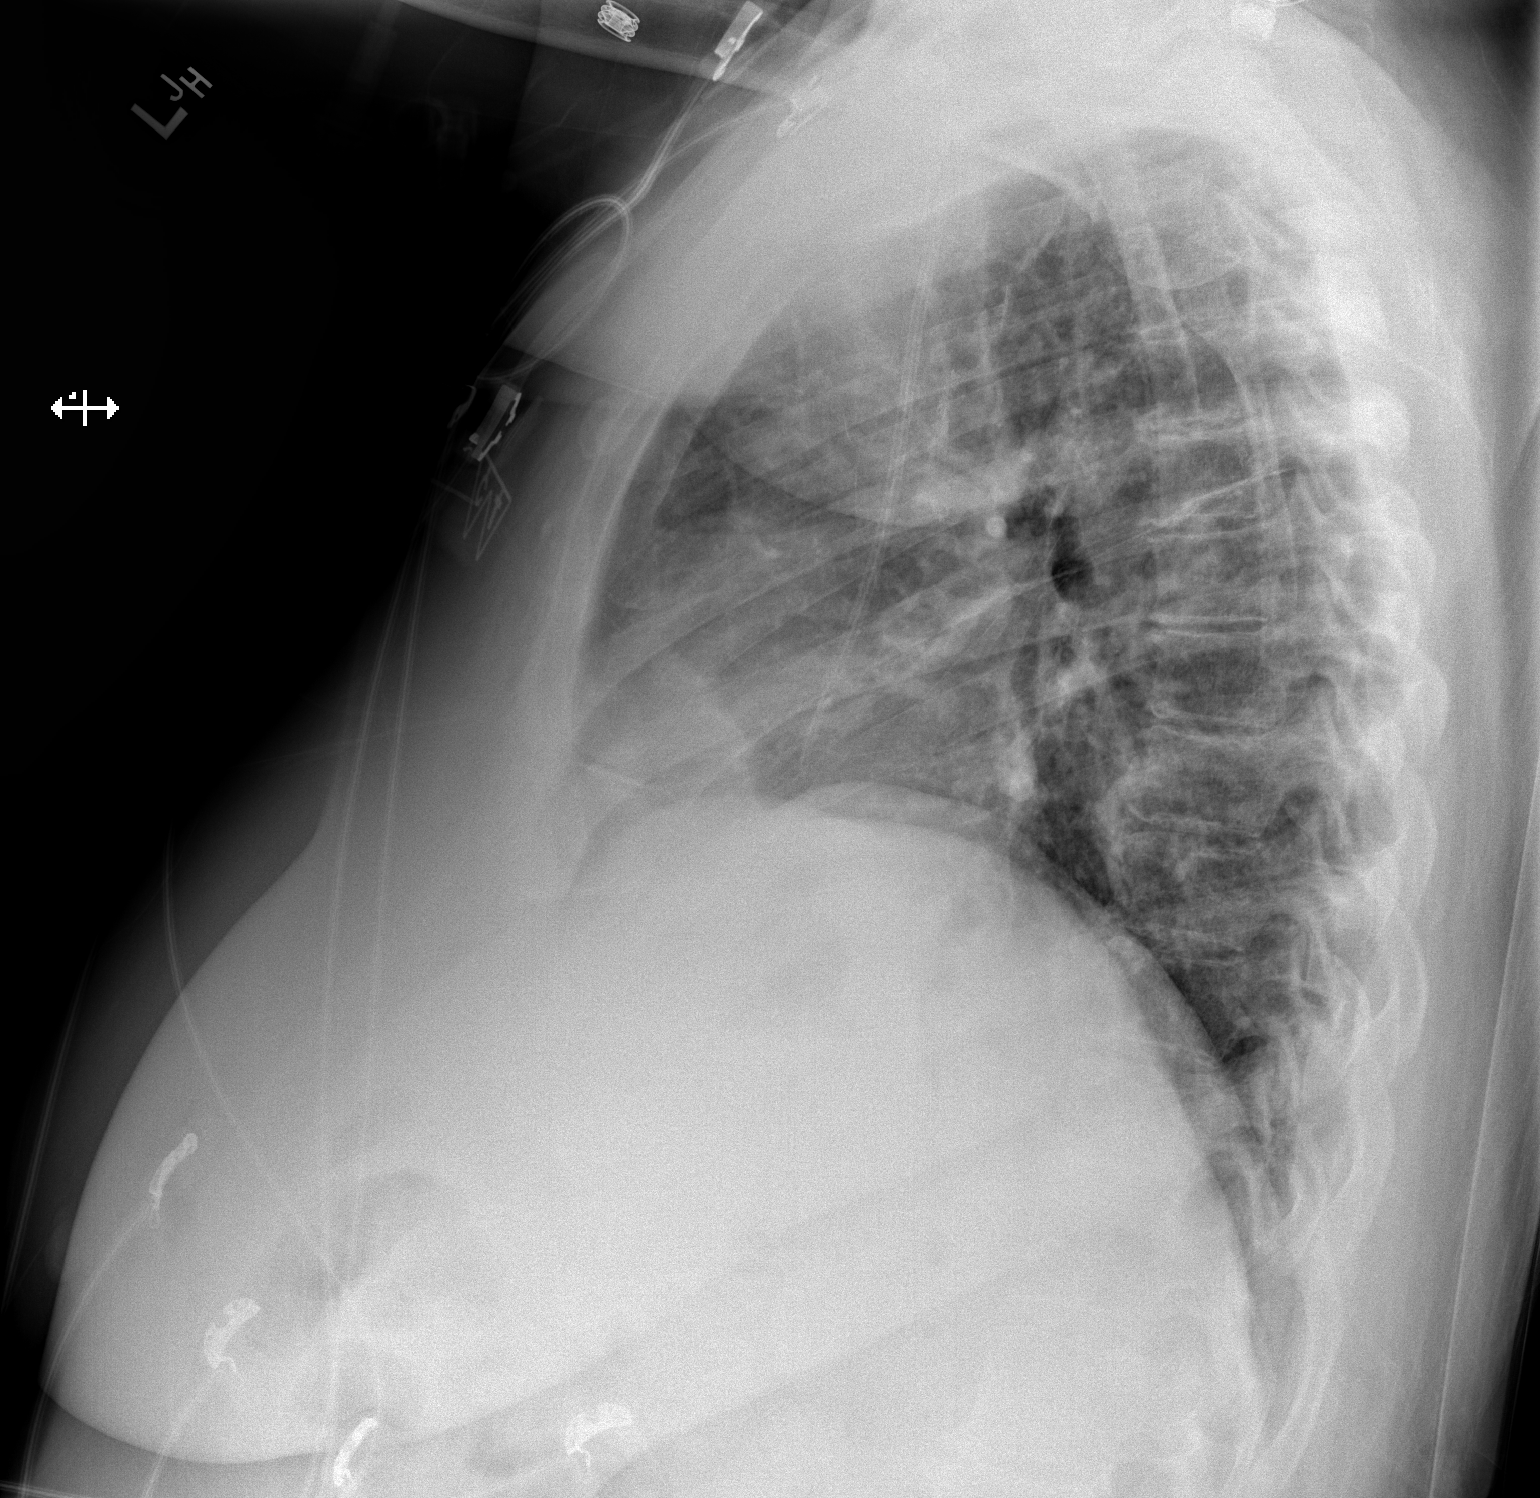

[x chest ap]
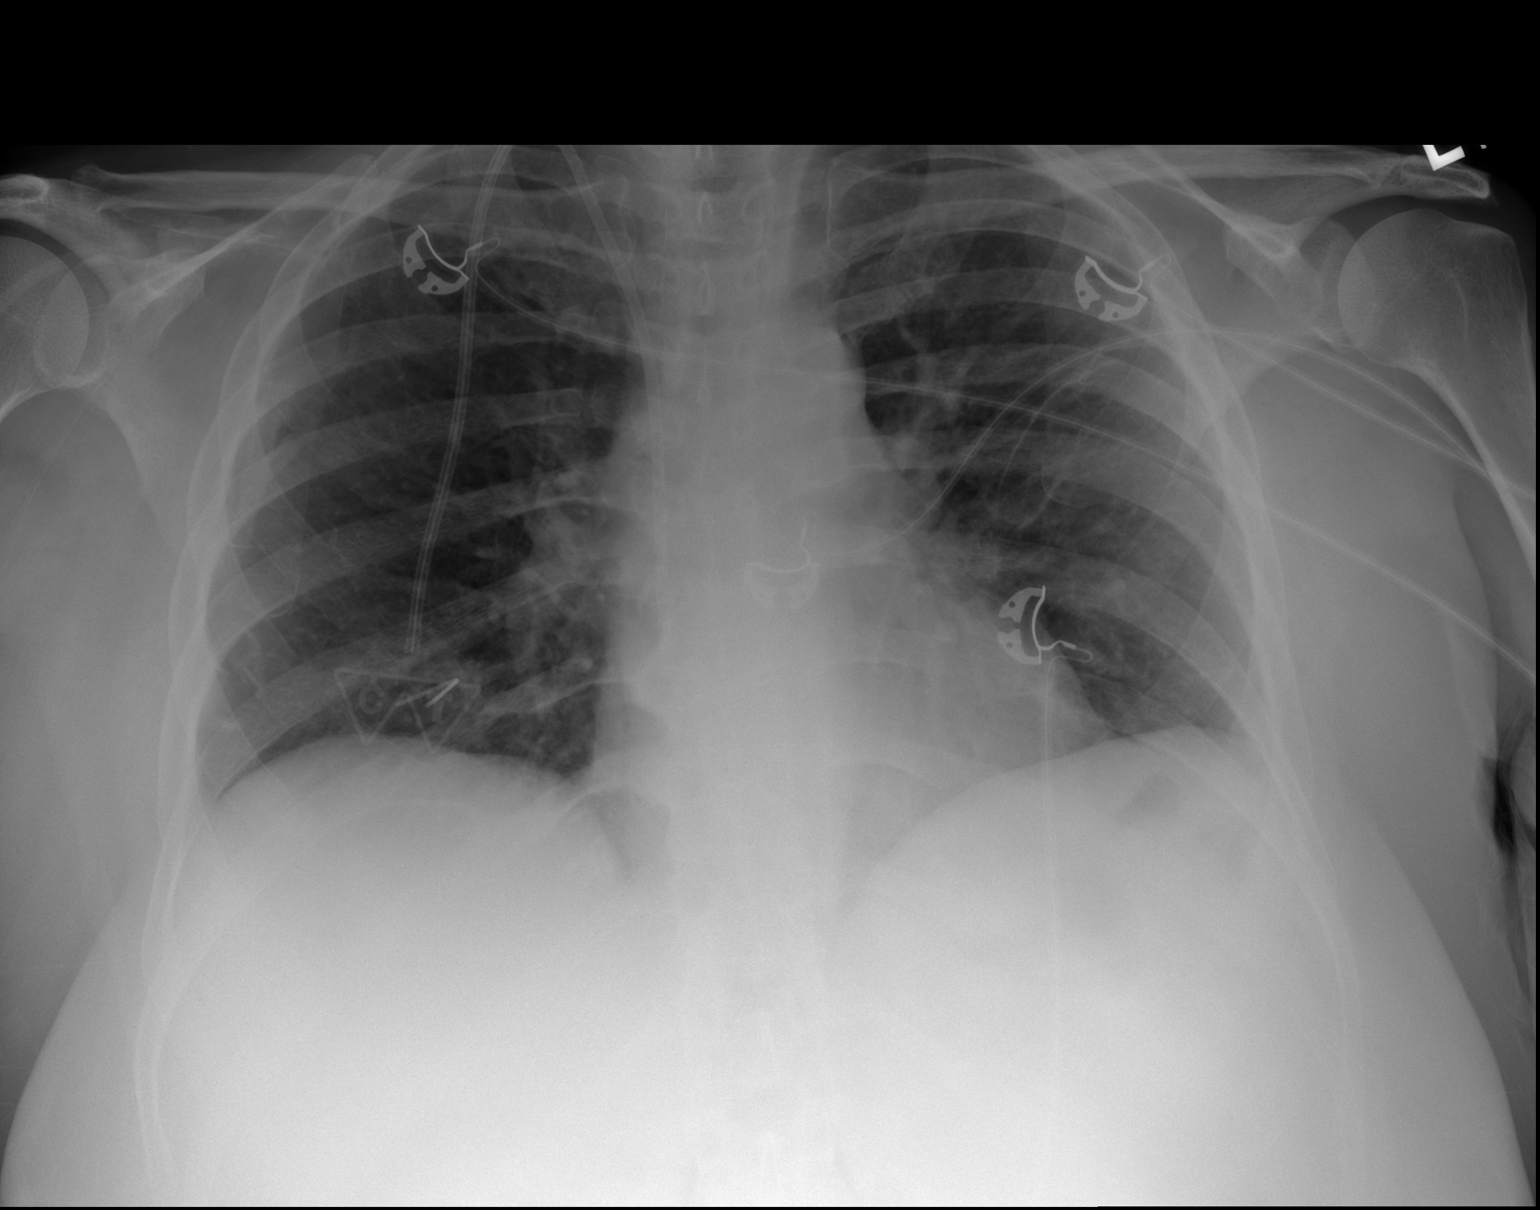

[2 of 2 positions shown; findings below may reference images not displayed]

FINDINGS: Right subclavian MediPort terminates at the cavoatrial junction.
Stable cardiomediastinal silhouette with normal heart size. No
pneumothorax. No pleural effusion. No pulmonary edema. Surgical
sutures overlie the left upper lung. No acute consolidative airspace
disease.
IMPRESSION: No active cardiopulmonary disease.

## 2022-09-27 ENCOUNTER — Emergency Department (HOSPITAL_COMMUNITY): Payer: Medicaid Other

## 2022-09-27 ENCOUNTER — Inpatient Hospital Stay (HOSPITAL_COMMUNITY)
Admission: EM | Admit: 2022-09-27 | Discharge: 2022-09-29 | DRG: 872 | Disposition: A | Discharge status: Home / Self Care | Payer: Medicaid Other | Attending: Internal Medicine | Admitting: Internal Medicine

## 2022-09-27 ENCOUNTER — Encounter (HOSPITAL_COMMUNITY): Payer: Self-pay | Admitting: Internal Medicine

## 2022-09-27 ENCOUNTER — Other Ambulatory Visit: Payer: Self-pay

## 2022-09-27 DIAGNOSIS — E785 Hyperlipidemia, unspecified: Secondary | ICD-10-CM | POA: Diagnosis present

## 2022-09-27 DIAGNOSIS — C9100 Acute lymphoblastic leukemia not having achieved remission: Secondary | ICD-10-CM | POA: Diagnosis present

## 2022-09-27 DIAGNOSIS — N390 Urinary tract infection, site not specified: Secondary | ICD-10-CM

## 2022-09-27 DIAGNOSIS — Z86711 Personal history of pulmonary embolism: Secondary | ICD-10-CM

## 2022-09-27 DIAGNOSIS — E669 Obesity, unspecified: Secondary | ICD-10-CM | POA: Diagnosis present

## 2022-09-27 DIAGNOSIS — N10 Acute tubulo-interstitial nephritis: Secondary | ICD-10-CM | POA: Diagnosis present

## 2022-09-27 DIAGNOSIS — N182 Chronic kidney disease, stage 2 (mild): Secondary | ICD-10-CM | POA: Diagnosis present

## 2022-09-27 DIAGNOSIS — Z888 Allergy status to other drugs, medicaments and biological substances status: Secondary | ICD-10-CM | POA: Diagnosis not present

## 2022-09-27 DIAGNOSIS — E872 Acidosis, unspecified: Secondary | ICD-10-CM | POA: Diagnosis present

## 2022-09-27 DIAGNOSIS — E1165 Type 2 diabetes mellitus with hyperglycemia: Secondary | ICD-10-CM | POA: Diagnosis present

## 2022-09-27 DIAGNOSIS — Z95818 Presence of other cardiac implants and grafts: Secondary | ICD-10-CM | POA: Diagnosis not present

## 2022-09-27 DIAGNOSIS — R6520 Severe sepsis without septic shock: Secondary | ICD-10-CM | POA: Diagnosis present

## 2022-09-27 DIAGNOSIS — Z7951 Long term (current) use of inhaled steroids: Secondary | ICD-10-CM

## 2022-09-27 DIAGNOSIS — K117 Disturbances of salivary secretion: Secondary | ICD-10-CM | POA: Diagnosis present

## 2022-09-27 DIAGNOSIS — F411 Generalized anxiety disorder: Secondary | ICD-10-CM | POA: Diagnosis present

## 2022-09-27 DIAGNOSIS — Z7985 Long-term (current) use of injectable non-insulin antidiabetic drugs: Secondary | ICD-10-CM

## 2022-09-27 DIAGNOSIS — N12 Tubulo-interstitial nephritis, not specified as acute or chronic: Secondary | ICD-10-CM | POA: Diagnosis present

## 2022-09-27 DIAGNOSIS — Z87440 Personal history of urinary (tract) infections: Secondary | ICD-10-CM | POA: Diagnosis not present

## 2022-09-27 DIAGNOSIS — Z79899 Other long term (current) drug therapy: Secondary | ICD-10-CM | POA: Diagnosis not present

## 2022-09-27 DIAGNOSIS — A419 Sepsis, unspecified organism: Secondary | ICD-10-CM | POA: Diagnosis present

## 2022-09-27 DIAGNOSIS — Z800 Family history of malignant neoplasm of digestive organs: Secondary | ICD-10-CM

## 2022-09-27 DIAGNOSIS — Z6837 Body mass index (BMI) 37.0-37.9, adult: Secondary | ICD-10-CM | POA: Diagnosis not present

## 2022-09-27 DIAGNOSIS — Z8673 Personal history of transient ischemic attack (TIA), and cerebral infarction without residual deficits: Secondary | ICD-10-CM

## 2022-09-27 DIAGNOSIS — Z95828 Presence of other vascular implants and grafts: Secondary | ICD-10-CM | POA: Diagnosis not present

## 2022-09-27 DIAGNOSIS — Z794 Long term (current) use of insulin: Secondary | ICD-10-CM

## 2022-09-27 DIAGNOSIS — I129 Hypertensive chronic kidney disease with stage 1 through stage 4 chronic kidney disease, or unspecified chronic kidney disease: Secondary | ICD-10-CM | POA: Diagnosis present

## 2022-09-27 DIAGNOSIS — I10 Essential (primary) hypertension: Secondary | ICD-10-CM | POA: Diagnosis present

## 2022-09-27 DIAGNOSIS — E1122 Type 2 diabetes mellitus with diabetic chronic kidney disease: Secondary | ICD-10-CM | POA: Diagnosis present

## 2022-09-27 DIAGNOSIS — Z79622 Long term (current) use of janus kinase inhibitor: Secondary | ICD-10-CM | POA: Diagnosis not present

## 2022-09-27 DIAGNOSIS — Z806 Family history of leukemia: Secondary | ICD-10-CM

## 2022-09-27 DIAGNOSIS — Z8249 Family history of ischemic heart disease and other diseases of the circulatory system: Secondary | ICD-10-CM

## 2022-09-27 DIAGNOSIS — C9101 Acute lymphoblastic leukemia, in remission: Secondary | ICD-10-CM | POA: Diagnosis present

## 2022-09-27 DIAGNOSIS — R739 Hyperglycemia, unspecified: Secondary | ICD-10-CM | POA: Diagnosis not present

## 2022-09-27 DIAGNOSIS — Z825 Family history of asthma and other chronic lower respiratory diseases: Secondary | ICD-10-CM

## 2022-09-27 LAB — COMPREHENSIVE METABOLIC PANEL
ALT: 28 U/L (ref 0–44)
AST: 56 U/L — ABNORMAL HIGH (ref 15–41)
Albumin: 2.9 g/dL — ABNORMAL LOW (ref 3.5–5.0)
Alkaline Phosphatase: 83 U/L (ref 38–126)
Anion gap: 13 (ref 5–15)
BUN: 13 mg/dL (ref 6–20)
CO2: 21 mmol/L — ABNORMAL LOW (ref 22–32)
Calcium: 8.4 mg/dL — ABNORMAL LOW (ref 8.9–10.3)
Chloride: 102 mmol/L (ref 98–111)
Creatinine, Ser: 1.69 mg/dL — ABNORMAL HIGH (ref 0.44–1.00)
GFR, Estimated: 35 mL/min — ABNORMAL LOW (ref >60)
Glucose, Bld: 297 mg/dL — ABNORMAL HIGH (ref 70–99)
Potassium: 3.5 mmol/L (ref 3.5–5.1)
Sodium: 136 mmol/L (ref 135–145)
Total Bilirubin: 0.5 mg/dL (ref 0.3–1.2)
Total Protein: 6.7 g/dL (ref 6.5–8.1)

## 2022-09-27 LAB — URINALYSIS, W/ REFLEX TO CULTURE (INFECTION SUSPECTED)
Glucose, UA: 150 mg/dL — AB
Hgb urine dipstick: NEGATIVE
Ketones, ur: 5 mg/dL — AB
Nitrite: NEGATIVE
Protein, ur: 100 mg/dL — AB
Specific Gravity, Urine: 1.027 (ref 1.005–1.030)
WBC, UA: >50 WBC/hpf (ref 0–5)
pH: 5 (ref 5.0–8.0)

## 2022-09-27 LAB — CBC WITH DIFFERENTIAL/PLATELET
Abs Immature Granulocytes: 0.3 10*3/uL — ABNORMAL HIGH (ref 0.00–0.07)
Basophils Absolute: 0 10*3/uL (ref 0.0–0.1)
Basophils Relative: 0 %
Eosinophils Absolute: 0 10*3/uL (ref 0.0–0.5)
Eosinophils Relative: 0 %
HCT: 33.3 % — ABNORMAL LOW (ref 36.0–46.0)
Hemoglobin: 10.8 g/dL — ABNORMAL LOW (ref 12.0–15.0)
Immature Granulocytes: 1 %
Lymphocytes Relative: 6 %
Lymphs Abs: 1.3 10*3/uL (ref 0.7–4.0)
MCH: 30.9 pg (ref 26.0–34.0)
MCHC: 32.4 g/dL (ref 30.0–36.0)
MCV: 95.4 fL (ref 80.0–100.0)
Monocytes Absolute: 0.7 10*3/uL (ref 0.1–1.0)
Monocytes Relative: 3 %
Neutro Abs: 19 10*3/uL — ABNORMAL HIGH (ref 1.7–7.7)
Neutrophils Relative %: 90 %
Platelets: 331 10*3/uL (ref 150–400)
RBC: 3.49 MIL/uL — ABNORMAL LOW (ref 3.87–5.11)
RDW: 14.4 % (ref 11.5–15.5)
WBC: 21.3 10*3/uL — ABNORMAL HIGH (ref 4.0–10.5)
nRBC: 0 % (ref 0.0–0.2)

## 2022-09-27 LAB — I-STAT CG4 LACTIC ACID, ED
Lactic Acid, Venous: 2.3 mmol/L (ref 0.5–1.9)
Lactic Acid, Venous: 1.6 mmol/L (ref 0.5–1.9)

## 2022-09-27 MED ORDER — ONDANSETRON HCL 4 MG/2ML IJ SOLN: 4.0000 mg | Freq: Four times a day (QID) | INTRAMUSCULAR | Status: DC | PRN

## 2022-09-27 MED ORDER — LORAZEPAM 1 MG PO TABS: 1.0000 mg | ORAL_TABLET | ORAL | Status: DC | PRN

## 2022-09-27 MED ORDER — ACETAMINOPHEN 325 MG PO TABS
650.0000 mg | ORAL_TABLET | Freq: Four times a day (QID) | ORAL | Status: DC | PRN
  Administered 2022-09-28 – 2022-09-29 (×2): 650 mg via ORAL
  Filled 2022-09-27 (×2): qty 2

## 2022-09-27 MED ORDER — ONDANSETRON HCL 4 MG PO TABS: 4.0000 mg | ORAL_TABLET | Freq: Four times a day (QID) | ORAL | Status: DC | PRN

## 2022-09-27 MED ORDER — RUXOLITINIB PHOSPHATE 5 MG PO TABS
5.0000 mg | ORAL_TABLET | Freq: Two times a day (BID) | ORAL | Status: DC
  Administered 2022-09-28 – 2022-09-29 (×2): 5 mg via ORAL
  Filled 2022-09-27 (×3): qty 1

## 2022-09-27 MED ORDER — SODIUM CHLORIDE 0.9 % IV SOLN
2.0000 g | INTRAVENOUS | Status: DC
  Administered 2022-09-28: 2 g via INTRAVENOUS
  Filled 2022-09-27: qty 20

## 2022-09-27 MED ORDER — ACETAMINOPHEN 650 MG RE SUPP: 650.0000 mg | Freq: Four times a day (QID) | RECTAL | Status: DC | PRN

## 2022-09-27 MED ORDER — ENOXAPARIN SODIUM 40 MG/0.4ML IJ SOSY: 40.0000 mg | PREFILLED_SYRINGE | INTRAMUSCULAR | Status: DC

## 2022-09-27 MED ORDER — VANCOMYCIN HCL 1750 MG/350ML IV SOLN
1750.0000 mg | Freq: Once | INTRAVENOUS | Status: AC
  Administered 2022-09-27: 1750 mg via INTRAVENOUS
  Filled 2022-09-27: qty 350

## 2022-09-27 MED ORDER — SODIUM CHLORIDE 0.9 % IV SOLN
1.0000 g | INTRAVENOUS | Status: DC
Start: 2022-09-27 — End: 2022-09-27

## 2022-09-27 MED ORDER — LACTATED RINGERS IV BOLUS
1000.0000 mL | Freq: Once | INTRAVENOUS | Status: AC
  Administered 2022-09-27: 1000 mL via INTRAVENOUS

## 2022-09-27 MED ORDER — SODIUM CHLORIDE 0.9 % IV SOLN
2.0000 g | Freq: Once | INTRAVENOUS | Status: AC
  Administered 2022-09-27: 2 g via INTRAVENOUS
  Filled 2022-09-27: qty 12.5

## 2022-09-27 MED ORDER — NON FORMULARY: 5.0000 mg | Freq: Two times a day (BID) | Status: DC

## 2022-09-27 MED ORDER — CEVIMELINE HCL 30 MG PO CAPS: 30.0000 mg | ORAL_CAPSULE | Freq: Three times a day (TID) | ORAL | Status: DC

## 2022-09-27 MED ORDER — PRAVASTATIN SODIUM 40 MG PO TABS
20.0000 mg | ORAL_TABLET | Freq: Every day | ORAL | Status: DC
  Administered 2022-09-27 – 2022-09-28 (×2): 20 mg via ORAL
  Filled 2022-09-27: qty 1
  Filled 2022-09-27: qty 2

## 2022-09-27 MED ORDER — LACTATED RINGERS IV SOLN: INTRAVENOUS | Status: AC

## 2022-09-27 MED ORDER — HYDRALAZINE HCL 20 MG/ML IJ SOLN: 10.0000 mg | Freq: Four times a day (QID) | INTRAMUSCULAR | Status: DC | PRN

## 2022-09-27 MED ORDER — SENNOSIDES-DOCUSATE SODIUM 8.6-50 MG PO TABS: 1.0000 | ORAL_TABLET | Freq: Every evening | ORAL | Status: DC | PRN

## 2022-09-27 MED ORDER — IOHEXOL 350 MG/ML SOLN
75.0000 mL | Freq: Once | INTRAVENOUS | Status: AC | PRN
  Administered 2022-09-27: 75 mL via INTRAVENOUS

## 2022-09-27 NOTE — H&P (Addendum)
History and Physical    Lynn [name] ZOX:[id number] DOB: 27-Jun-1965 DOA: 09/27/2022  PCP: Clovis Riley, L.August Saucer, MD   Patient coming from: Home   Chief Complaint:  Chief Complaint  Patient presents with   Fever    HPI:  Lynn [name] is a 57 y.o. female with medical history significant of essential hypertension,  CKD stage II/IIIa, ALL currently in remission status post autologous stem cell transplant, with posttransplant complications with chronic graft-versus-host disease, history of pulmonary embolism as well as intracranial hemorrhage s/p external ventricular drain placement,  subdural hematoma s/p suboccipital craniotomy for decompression in 2013, currently having an IVC filter in place and on chronic immunosuppression presented to emergency department with complaining of chills that started from last night.  Patient is also complaining about right lower quadrant abdominal pain that radiating to her back and flank.  She woke up in this morning with some fever.  She is also endorsing dizziness and fatigue from this morning.  Patient stated that she had lab work done at outside lab and it showed urinary tract infection.  Patient reported she has her oral surgery/tooth extraction 6 weeks ago and she is not treated on any empiric antibiotic for that.   ED Course:  At presentation to ED patient found hypotensive 87/60, tachycardic 104, respiratory 17 and O2 sat 99% room air.  Outside labs showed UA positive with leukocyte esterase, nitrate positive, protein 100 and moderate blood.  UA in the ED showed amber appearance, cloudy color, bilirubin positive, ketone positive, protein 43 leukocyte esterase and few bacteria.  Pending urine culture.  CBC showed leukocytosis 21.3, stable H&H 10.8 and 33.3 and platelet 331. CMP unremarkable except slightly low bicarb 21, elevated blood glucose 297, creatinine 1.6 (baseline creatinine is around 1.2-1.6) and GFR 35, albumin 2.9, elevated AST  56.  Initial lactic acid 2.3 which has been improved to 1.6.  Chest x-ray no acute cardiopulmonary process CT abdomen pelvis showed: 1. Subtle striated appearance of the right renal parenchyma on delayed imaging, with mucosal enhancement of the right renal pelvis and proximal right ureter. Findings are consistent with urinary tract infection and pyelonephritis. No evidence of renal abscess. Please correlate with urinalysis. 2. Borderline dilation of the distal appendiceal tip, with no other inflammatory changes to suggest appendicitis.  In the ED patient has been treated with 1 L of LR bolus, vancomycin and cefepime.  Hospitalist has been contacted for admission for management of pyelonephritis.  Review of Systems:  Review of Systems  Constitutional:  Positive for chills and malaise/fatigue. Negative for fever and weight loss.  Respiratory:  Negative for cough.   Cardiovascular:  Negative for chest pain and palpitations.  Gastrointestinal:  Positive for nausea. Negative for abdominal pain, heartburn and vomiting.  Genitourinary:  Negative for dysuria, flank pain, frequency, hematuria and urgency.  Skin:  Negative for itching and rash.  Neurological:  Negative for dizziness and headaches.  Psychiatric/Behavioral:  The patient is not nervous/anxious.     Past Medical History:  Diagnosis Date   ALL (acute lymphoblastic leukemia) (HCC)    Anemia    Arthritis    Brain bleed (HCC)    07/04/11   Diabetes mellitus without complication (HCC)    Headache(784.0)    Hypertension    Leukemia (HCC)    Pneumonia    Pulmonary embolism (HCC)    06/22/11   Sweet's syndrome    Thrush of mouth and esophagus (HCC) 04/09/2014    Past Surgical History:  Procedure Laterality Date  BONE MARROW TRANSPLANT     CESAREAN SECTION  2000   CRANIOTOMY  07/04/11   EYE SURGERY Right 2005   repair crossed eye   FOOT SURGERY     greenfield filter     LUNG BIOPSY  06/19/13     reports that she has  never smoked. She has never used smokeless tobacco. She reports that she does not drink alcohol and does not use drugs.  Allergies  Allergen Reactions   Tegaderm Ag Mesh [Silver] Rash    Family History  Problem Relation Age of Onset   Pancreatic cancer Father    Cancer Mother        colon   Pancreatic cancer Mother    Asthma Son    Hypertension Sister    Diabetes Neg Hx    Heart disease Neg Hx    Breast cancer Neg Hx     Prior to Admission medications   Medication Sig Start Date End Date Taking? Authorizing Provider  ACCU-CHEK AVIVA PLUS test strip USE TO TEST BLOOD GLUCOSE UP TO 2 TIMES DAILY 10/12/16   Reather Littler, MD  acetaminophen (TYLENOL) 500 MG tablet Take 1,000 mg by mouth every 6 (six) hours as needed for moderate pain.    [provider]  acyclovir (ZOVIRAX) 800 MG tablet Take 1 tablet (800 mg total) by mouth 2 (two) times daily. . 08/27/13   Chism, Onalee Hua, MD  ADVAIR DISKUS 500-50 MCG/DOSE AEPB Inhale 1 puff into the lungs daily as needed (shortness of breath).  05/29/16   [provider]  amitriptyline (ELAVIL) 10 MG tablet TAKE 2 TABLETS BY MOUTH AT BEDTIME 03/15/18   Anson Fret, MD  cetirizine (ZYRTEC) 10 MG tablet Take 1 tablet (10 mg total) by mouth daily. Patient taking differently: Take 10 mg by mouth daily as needed for allergies.  03/07/14   Charm Rings, MD  Elastic Bandages & Supports (MEDICAL COMPRESSION SOCKS) MISC Please dispense one pair. 05/13/15   Felicie Morn, NP  gabapentin (NEURONTIN) 300 MG capsule TAKE ONE CAPSULE BY MOUTH THREE TIMES DAILY Patient taking differently: Take 300 mg by mouth daily as needed (pain).  02/06/17   Nilda Riggs, NP  ibuprofen (ADVIL,MOTRIN) 200 MG tablet Take 600-800 mg by mouth every 6 (six) hours as needed for moderate pain.    [provider]  KLOR-CON M20 20 MEQ tablet TAKE 1 TABLET (20 MEQ TOTAL) BY MOUTH DAILY. Patient taking differently: Take 20 mEq by mouth at bedtime.  05/01/17    Reather Littler, MD  LORazepam (ATIVAN) 1 MG tablet Take 1 mg by mouth every 4 (four) hours as needed for anxiety.  02/06/14   [provider]  Magnesium 250 MG TABS Take 250 mg by mouth daily.    [provider]  Multiple Vitamin (MULTIVITAMIN) tablet Take 1 tablet by mouth daily.    [provider]  NOVOFINE PLUS 32G X 4 MM MISC  05/31/14   [provider]  NOVOLOG FLEXPEN 100 UNIT/ML FlexPen INJECT 16 UNITS WITH EACH MEAL PLUS 18-20 EXTRA UNITS IF SUGAR IS OVER 250 Patient not taking: Reported on 10/25/2017 09/06/15   Reather Littler, MD  ondansetron (ZOFRAN ODT) 4 MG disintegrating tablet Take 1 tablet (4 mg total) by mouth every 8 (eight) hours as needed for nausea or vomiting. 04/29/16   Rhunette Croft, Ankit, MD  OZEMPIC 0.25 or 0.5 MG/DOSE SOPN INJECT 0.5 MG INTO THE SKIN ONCE A WEEK. Patient not taking: Reported on 10/25/2017 07/29/17  Reather Littler, MD  pravastatin (PRAVACHOL) 20 MG tablet TAKE 1 TABLET (20 MG TOTAL) BY MOUTH DAILY. Patient not taking: Reported on 10/25/2017 07/09/17   Reather Littler, MD  sulfamethoxazole-trimethoprim (BACTRIM DS,SEPTRA DS) 800-160 MG per tablet Take 1 tablet by mouth 3 (three) times a week. Monday,Wednesday, and Friday 06/19/14   Artis Delay, MD  SUMAtriptan (IMITREX) 100 MG tablet Take 1 tablet (100 mg total) by mouth once as needed for up to 1 dose. May repeat in 2 hours if headache persists or recurs. Patient taking differently: Take 100 mg by mouth at bedtime. May repeat in 2 hours if headache persists or recurs. 03/20/17   Anson Fret, MD     Physical Exam: Vitals:   09/27/22 1453 09/27/22 1612 09/27/22 1740 09/27/22 1852  BP: (!) 91/59 93/61 (!) 145/86   Pulse: 92 79 81   Resp: 19 17 18    Temp: 99 F (37.2 C) 99.2 F (37.3 C) 97.8 F (36.6 C) 99.7 F (37.6 C)  TempSrc: Oral Oral Oral Oral  SpO2: 99% 100% 100%     Physical Exam HENT:     Head: Normocephalic and atraumatic.     Nose: Nose normal.  Eyes:     Pupils: Pupils  are equal, round, and reactive to light.  Cardiovascular:     Rate and Rhythm: Normal rate and regular rhythm.     Pulses: Normal pulses.     Heart sounds: Normal heart sounds.  Pulmonary:     Effort: Pulmonary effort is normal.     Breath sounds: Normal breath sounds.  Abdominal:     General: Bowel sounds are normal.     Tenderness: There is no right CVA tenderness or left CVA tenderness.  Musculoskeletal:     Cervical back: Neck supple.     Right lower leg: No edema.     Left lower leg: No edema.  Skin:    General: Skin is warm.  Neurological:     Mental Status: She is alert and oriented to person, place, and time.  Psychiatric:        Mood and Affect: Mood normal.        Thought Content: Thought content normal.      Labs on Admission: I have personally reviewed following labs and imaging studies  CBC: Recent Labs  Lab 09/27/22 1352  WBC 21.3*  NEUTROABS 19.0*  HGB 10.8*  HCT 33.3*  MCV 95.4  PLT 331   Basic Metabolic Panel: Recent Labs  Lab 09/27/22 1352  NA 136  K 3.5  CL 102  CO2 21*  GLUCOSE 297*  BUN 13  CREATININE 1.69*  CALCIUM 8.4*   GFR: CrCl cannot be calculated (Unknown ideal weight.). Liver Function Tests: Recent Labs  Lab 09/27/22 1352  AST 56*  ALT 28  ALKPHOS 83  BILITOT 0.5  PROT 6.7  ALBUMIN 2.9*   No results for input(s): "LIPASE", "AMYLASE" in the last 168 hours. No results for input(s): "AMMONIA" in the last 168 hours. Coagulation Profile: No results for input(s): "INR", "PROTIME" in the last 168 hours. Cardiac Enzymes: No results for input(s): "CKTOTAL", "CKMB", "CKMBINDEX", "TROPONINI", "TROPONINIHS" in the last 168 hours. BNP (last 3 results) No results for input(s): "BNP" in the last 8760 hours. HbA1C: No results for input(s): "HGBA1C" in the last 72 hours. CBG: No results for input(s): "GLUCAP" in the last 168 hours. Lipid Profile: No results for input(s): "CHOL", "HDL", "LDLCALC", "TRIG", "CHOLHDL", "LDLDIRECT"  in the last 72 hours. Thyroid  Function Tests: No results for input(s): "TSH", "T4TOTAL", "FREET4", "T3FREE", "THYROIDAB" in the last 72 hours. Anemia Panel: No results for input(s): "VITAMINB12", "FOLATE", "FERRITIN", "TIBC", "IRON", "RETICCTPCT" in the last 72 hours. Urine analysis:    Component Value Date/Time   COLORURINE AMBER (A) 09/27/2022 1352   APPEARANCEUR CLOUDY (A) 09/27/2022 1352   LABSPEC 1.027 09/27/2022 1352   LABSPEC 1.025 10/07/2015 1039   PHURINE 5.0 09/27/2022 1352   GLUCOSEU 150 (A) 09/27/2022 1352   GLUCOSEU Negative 10/07/2015 1039   HGBUR NEGATIVE 09/27/2022 1352   BILIRUBINUR MODERATE (A) 09/27/2022 1352   BILIRUBINUR Color Interference 10/07/2015 1039   KETONESUR 5 (A) 09/27/2022 1352   PROTEINUR 100 (A) 09/27/2022 1352   UROBILINOGEN 0.2 10/07/2015 1039   NITRITE NEGATIVE 09/27/2022 1352   LEUKOCYTESUR LARGE (A) 09/27/2022 1352   LEUKOCYTESUR Moderate 10/07/2015 1039    Radiological Exams on Admission: I have personally reviewed images CT ABDOMEN PELVIS W CONTRAST  Result Date: 09/27/2022 CLINICAL DATA:  Right lower quadrant abdominal pain EXAM: CT ABDOMEN AND PELVIS WITH CONTRAST TECHNIQUE: Multidetector CT imaging of the abdomen and pelvis was performed using the standard protocol following bolus administration of intravenous contrast. RADIATION DOSE REDUCTION: This exam was performed according to the departmental dose-optimization program which includes automated exposure control, adjustment of the mA and/or kV according to patient size and/or use of iterative reconstruction technique. CONTRAST:  75mL OMNIPAQUE IOHEXOL 350 MG/ML SOLN COMPARISON:  09/21/2004 FINDINGS: Lower chest: No acute pleural or parenchymal lung disease. Hepatobiliary: No focal liver abnormality is seen. No gallstones, gallbladder wall thickening, or biliary dilatation. Pancreas: Unremarkable. No pancreatic ductal dilatation or surrounding inflammatory changes. Spleen: Interval atrophy of  the spleen since prior exam. No focal parenchymal abnormality. Adrenals/Urinary Tract: There is subtle areas of decreased attenuation within the right renal parenchyma noted on delayed imaging, and a somewhat striated pattern suggesting underlying pyelonephritis. Mild mucosal enhancement of the right renal pelvis and proximal right ureter compatible with infection. No evidence of renal abscess. The left kidney is unremarkable. The adrenals and bladder are normal. Stomach/Bowel: No bowel obstruction or ileus. The appendix is well visualized in the right lower quadrant. The distal appendiceal tip measures up to 8 mm in diameter, with no evidence of associated mural thickening or periappendiceal fat stranding. This is a nonspecific finding. Proximal appendix is normal in caliber with gas throughout the appendiceal lumen. Vascular/Lymphatic: IVC filter at the L3-4 level. No other significant vascular findings. No pathologic adenopathy. Reproductive: Uterus and bilateral adnexa are unremarkable. Other: No free fluid or free intraperitoneal gas. No abdominal wall hernia. Musculoskeletal: No acute or destructive bony abnormalities. Reconstructed images demonstrate no additional findings. IMPRESSION: 1. Subtle striated appearance of the right renal parenchyma on delayed imaging, with mucosal enhancement of the right renal pelvis and proximal right ureter. Findings are consistent with urinary tract infection and pyelonephritis. No evidence of renal abscess. Please correlate with urinalysis. 2. Borderline dilation of the distal appendiceal tip, with no other inflammatory changes to suggest appendicitis. Electronically Signed   By: Sharlet Salina [name]   On: 09/27/2022 17:54   DG Chest Portable 1 View  Result Date: 09/27/2022 CLINICAL DATA:  sepsis EXAM: PORTABLE CHEST 1 VIEW COMPARISON:  Chest x-ray 10/10/2015. FINDINGS: The heart size and mediastinal contours are within normal limits. Both lungs are clear. No visible  pleural effusions or pneumothorax. No acute osseous abnormality. Chain sutures in the left mid to upper lung. IMPRESSION: No evidence of acute cardiopulmonary disease. Electronically Signed   By:  Feliberto Harts [name]   On: 09/27/2022 16:11    EKG: My personal interpretation of EKG shows:     Assessment/Plan: Principal Problem:   Sepsis secondary to UTI [facility]) Active Problems:   History of ALL (acute lymphoblastic leukemia) (HCC)/P allogenic bone marrow transplant   Essential hypertension   History of pulmonary embolism   Acute pyelonephritis   CKD (chronic kidney disease), stage II   Pyelonephritis   Hyperglycemia   Hyperlipidemia   Generalized anxiety disorder   Xerostomia    Assessment and Plan: Sepsis secondary to pyelonephritis/UTI: -Patient coming with complaining of fever and chills for 1 day. -Patient is afebrile.  However blood pressure soft 87/60 on initial presentation.  CBC showed leukocytosis 21.3.  Lactic acid has been improved 2.3-1.2 with IV fluid. -UA showed evidence of UTI.  Pending urine and blood cultures. - CT abdomen pelvis showed right Parenchymal enhancement consistent with pyelonephritis. -In the ED patient received vancomycin and cefepime and 1 L of LR.  Blood pressure has been improved to 142/86.   - Based on hypotension, tachycardia and infection source patient meets sepsis criteria. - Plan to continue broad-spectrum antibiotic treatment with ceftriaxone 2 g daily.  Will follow-up with blood culture and urine culture result for antibiotic guidance. - Continue maintenance fluid LR 100 cc/h for 1 day. -Monitor urine output   History of CKD stage II/IIIa - Creatinine 1.69 (baseline creatinine between 1.2-1.6 and baseline GFR 45-54. - Continue to monitor renal function. -Avoid nephrotoxic agent  History of  ALL with allogenic bone marrow transplant -Consulted pharmacy for immunosuppressant and home medication reconciliation.  Per pharmacy patient is  currently on maintenance immunosuppressant Jakafi 5 mg twice daily. Patient is not on Diflucan, Bactrim and acyclovir anymore. - Currently stable.  Follows up with oncology at [facility] Dr. Frances Furbish -Resumed ruxolitinib 5 mg twice daily.   History of pulmonary embolism as well as intracranial hemorrhage s/p external ventricular drain placement,  subdural hematoma s/p suboccipital craniotomy for decompression in 2013, currently having an IVC filter in place  -Stable  Hyperglycemia - Blood glucose is 297 on presentation - Patient reported she has previous history of DM type II 2 to 3 years ago not on any insulin regimen anymore.  Given patient has elevated blood glucose checking A1c and starting carb consistent diet.  Hyperlipidemia - Continue pravastatin  Xerostomia - Resumed home Evoxac 30 mg 3 times daily.  Generalized anxiety disorder - Patient takes Ativan 1 mg every 4 hours as needed for anxiety.  Resumed home medication.  Essential hypertension - Patient takes losartan 50 mg daily at home. In the setting of sepsis holding losartan.  DVT prophylaxis: Patient has history of intracranial hemorrhage holding any pharmacological prophylaxis with Lovenox and heparin for now.  Continue SCD. Code Status:  Full Code Diet: Carb consistent diet Family Communication: Discussed treatment plan with patient Disposition Plan: Based on urine and blood culture result will decide about appropriate antibiotic course.  Plan to discharge to home next 2 to 3 days. Consults: Pharmacy consult for medication reconciliation Admission status:   Inpatient, medical-telemetry  Severity of Illness: The appropriate patient status for this patient is INPATIENT. Inpatient status is judged to be reasonable and necessary in order to provide the required intensity of service to ensure the patient's safety. The patient's presenting symptoms, physical exam findings, and initial radiographic and laboratory data  in the context of their chronic comorbidities is felt to place them at high risk for further clinical deterioration.  Furthermore, it is not anticipated that the patient will be medically stable for discharge from the hospital within 2 midnights of admission.   * I certify that at the point of admission it is my clinical judgment that the patient will require inpatient hospital care spanning beyond 2 midnights from the point of admission due to high intensity of service, high risk for further deterioration and high frequency of surveillance required.Marland Kitchen    Tereasa Coop, MD [facility]  How to contact the [facility] Attending or Consulting provider 7A - 7P or covering provider during after hours 7P -7A, for this patient.  Check the care team in [facility] and look for a) attending/consulting [facility] provider listed and b) the [facility] team listed Log into [email or web address] and use [place]'s universal password to access. If you do not have the password, please contact the hospital operator. Locate the [facility] provider you are looking for under [facility] and page to a number that you can be directly reached. If you still have difficulty reaching the provider, please page the [facility] (Director on Call) for the Hospitalists listed on amion for assistance.  09/27/2022, 9:27 PM

## 2022-09-27 NOTE — ED Triage Notes (Signed)
Pt to ED POV from [facility]. Pt states she began having chills last night. Pt states she began having RLQ abd pain radiating to lower back / flank. Pt states she woke up with a fever this morning. Pt states she was feeling dizzy / fatigued this morning. Pt states she had lab work done at [facility] and was dx with kidney infection and was sent by UC d/t elevated WBC. Pt has hx of leukemia but has been in remission since 2014 but still takes immunosuppressants daily. Pt states she also had oral surgery last week to have 6 teeth removed. Pt states UC gave her 1000 of tylenol PTA to ED and was also given a "shot" of antibiotics.

## 2022-09-27 NOTE — Progress Notes (Signed)
ED Pharmacy Antibiotic Sign Off An antibiotic consult was received from an ED provider for vancomycin and cefepime per pharmacy dosing for sepsis. A chart review was completed to assess appropriateness.   The following one time order(s) were placed:  Vancomycin 1750 mg IV x 1 Cefepime 2g IV x 1  Further antibiotic and/or antibiotic pharmacy consults should be ordered by the admitting provider if indicated.   Thank you for allowing pharmacy to be a part of this patient's care.   Daylene Posey, [facility]  Clinical Pharmacist 09/27/22 3:09 PM

## 2022-09-27 NOTE — ED Notes (Addendum)
ED TO INPATIENT HANDOFF REPORT  ED Nurse Name and Phone #: Angelica Chessman, [phone]  S Name/Age/Gender Lynn [name] 57 y.o. female Room/Bed: 034C/034C  Code Status   Code Status: Prior  Home/SNF/Other Home Patient oriented to: self, place, time, and situation Is this baseline? Yes   Triage Complete: Triage complete  Chief Complaint abn labs  Triage Note Pt to ED POV from UC. Pt states she began having chills last night. Pt states she began having RLQ abd pain radiating to lower back / flank. Pt states she woke up with a fever this morning. Pt states she was feeling dizzy / fatigued this morning. Pt states she had lab work done at [facility] and was dx with kidney infection and was sent by UC d/t elevated WBC. Pt has hx of leukemia but has been in remission since 2014 but still takes immunosuppressants daily. Pt states she also had oral surgery last week to have 6 teeth removed. Pt states UC gave her 1000 of tylenol PTA to ED and was also given a "shot" of antibiotics.    Allergies Allergies  Allergen Reactions   Tegaderm Ag Mesh [Silver] Rash    Level of Care/Admitting Diagnosis ED Disposition     ED Disposition  Admit   Condition  --   Comment  The patient appears reasonably stabilized for admission considering the current resources, flow, and capabilities available in the ED at this time, and I doubt any other [facility] requiring further screening and/or treatment in the ED prior to admission is  present.          B Medical/Surgery History Past Medical History:  Diagnosis Date   ALL (acute lymphoblastic leukemia) (HCC)    Anemia    Arthritis    Brain bleed (HCC)    07/04/11   Diabetes mellitus without complication (HCC)    Headache(784.0)    Hypertension    Leukemia (HCC)    Pneumonia    Pulmonary embolism (HCC)    06/22/11   Sweet's syndrome    Thrush of mouth and esophagus (HCC) 04/09/2014   Past Surgical History:  Procedure Laterality Date   BONE MARROW TRANSPLANT      CESAREAN SECTION  2000   CRANIOTOMY  07/04/11   EYE SURGERY Right 2005   repair crossed eye   FOOT SURGERY     greenfield filter     LUNG BIOPSY  06/19/13     A IV Location/Drains/Wounds Patient Lines/Drains/Airways Status     Active Line/Drains/Airways     Name Placement date Placement time Site Days   Implanted Port Right Chest --  --  Chest  --   Implanted Port Right Chest --  --  Chest  --   Implanted Port 07/30/11 Right Chest 07/30/11  --  Chest  4077   Peripheral IV 10/25/17 Right Antecubital 10/25/17  1031  Antecubital  1798   Peripheral IV 09/27/22 20 G 1" Anterior;Proximal;Right Antecubital 09/27/22  1547  Antecubital  less than 1   CVC Double Lumen 09/26/11 Left 09/26/11  --  -- 4019            Intake/Output Last 24 hours No intake or output data in the 24 hours ending 09/27/22 1844  Labs/Imaging Results for orders placed or performed during the hospital encounter of 09/27/22 (from the past 48 hour(s))  Comprehensive metabolic panel     Status: Abnormal   Collection Time: 09/27/22  1:52 PM  Result Value Ref Range   Sodium 136 135 -  145 mmol/L   Potassium 3.5 3.5 - 5.1 mmol/L   Chloride 102 98 - 111 mmol/L   CO2 21 (L) 22 - 32 mmol/L   Glucose, Bld 297 (H) 70 - 99 mg/dL    Comment: Glucose reference range applies only to samples taken after fasting for at least 8 hours.   BUN 13 6 - 20 mg/dL   Creatinine, Ser 2.13 (H) 0.44 - 1.00 mg/dL   Calcium 8.4 (L) 8.9 - 10.3 mg/dL   Total Protein 6.7 6.5 - 8.1 g/dL   Albumin 2.9 (L) 3.5 - 5.0 g/dL   AST 56 (H) 15 - 41 U/L   ALT 28 0 - 44 U/L   Alkaline Phosphatase 83 38 - 126 U/L   Total Bilirubin 0.5 0.3 - 1.2 mg/dL   GFR, Estimated 35 (L) >60 mL/min    Comment: (NOTE) Calculated using the CKD-EPI Creatinine Equation (2021)    Anion gap 13 5 - 15    Comment: Performed at [facility], [address], [place], [place] [address]  CBC with Differential     Status: Abnormal   Collection Time: 09/27/22  1:52 PM   Result Value Ref Range   WBC 21.3 (H) 4.0 - 10.5 K/uL   RBC 3.49 (L) 3.87 - 5.11 MIL/uL   Hemoglobin 10.8 (L) 12.0 - 15.0 g/dL   HCT 84.6 (L) 96.2 - 95.2 %   MCV 95.4 80.0 - 100.0 fL   MCH 30.9 26.0 - 34.0 pg   MCHC 32.4 30.0 - 36.0 g/dL   RDW 84.1 32.4 - 40.1 %   Platelets 331 150 - 400 K/uL   nRBC 0.0 0.0 - 0.2 %   Neutrophils Relative % 90 %   Neutro Abs 19.0 (H) 1.7 - 7.7 K/uL   Lymphocytes Relative 6 %   Lymphs Abs 1.3 0.7 - 4.0 K/uL   Monocytes Relative 3 %   Monocytes Absolute 0.7 0.1 - 1.0 K/uL   Eosinophils Relative 0 %   Eosinophils Absolute 0.0 0.0 - 0.5 K/uL   Basophils Relative 0 %   Basophils Absolute 0.0 0.0 - 0.1 K/uL   Immature Granulocytes 1 %   Abs Immature Granulocytes 0.30 (H) 0.00 - 0.07 K/uL    Comment: Performed at [facility], [address] [place], [place] [address]  Urinalysis, w/ Reflex to Culture (Infection Suspected) -Urine, Clean Catch     Status: Abnormal   Collection Time: 09/27/22  1:52 PM  Result Value Ref Range   Specimen Source URINE, CLEAN CATCH    Color, Urine AMBER (A) YELLOW    Comment: BIOCHEMICALS MAY BE AFFECTED BY COLOR   APPearance CLOUDY (A) CLEAR   Specific Gravity, Urine 1.027 1.005 - 1.030   pH 5.0 5.0 - 8.0   Glucose, UA 150 (A) NEGATIVE mg/dL   Hgb urine dipstick NEGATIVE NEGATIVE   Bilirubin Urine MODERATE (A) NEGATIVE   Ketones, ur 5 (A) NEGATIVE mg/dL   Protein, ur 366 (A) NEGATIVE mg/dL   Nitrite NEGATIVE NEGATIVE   Leukocytes,Ua LARGE (A) NEGATIVE   RBC / HPF 0-5 0 - 5 RBC/hpf   WBC, UA >50 0 - 5 WBC/hpf    Comment:        Reflex urine culture not performed if WBC <=10, OR if Squamous epithelial cells >5. If Squamous epithelial cells >5 suggest recollection.    Bacteria, UA FEW (A) NONE SEEN   Squamous Epithelial / HPF 0-5 0 - 5 /HPF  WBC Clumps PRESENT    Mucus PRESENT    Non Squamous Epithelial 0-5 (A) NONE SEEN    Comment: Performed at [facility], [address], [place], [place]  [address]  I-Stat Lactic Acid, ED     Status: Abnormal   Collection Time: 09/27/22  2:02 PM  Result Value Ref Range   Lactic Acid, Venous 2.3 (HH) 0.5 - 1.9 mmol/L   Comment NOTIFIED PHYSICIAN   I-Stat Lactic Acid, ED     Status: None   Collection Time: 09/27/22  4:18 PM  Result Value Ref Range   Lactic Acid, Venous 1.6 0.5 - 1.9 mmol/L   CT ABDOMEN PELVIS W CONTRAST  Result Date: 09/27/2022 CLINICAL DATA:  Right lower quadrant abdominal pain EXAM: CT ABDOMEN AND PELVIS WITH CONTRAST TECHNIQUE: Multidetector CT imaging of the abdomen and pelvis was performed using the standard protocol following bolus administration of intravenous contrast. RADIATION DOSE REDUCTION: This exam was performed according to the departmental dose-optimization program which includes automated exposure control, adjustment of the mA and/or kV according to patient size and/or use of iterative reconstruction technique. CONTRAST:  75mL OMNIPAQUE IOHEXOL 350 MG/ML SOLN COMPARISON:  09/21/2004 FINDINGS: Lower chest: No acute pleural or parenchymal lung disease. Hepatobiliary: No focal liver abnormality is seen. No gallstones, gallbladder wall thickening, or biliary dilatation. Pancreas: Unremarkable. No pancreatic ductal dilatation or surrounding inflammatory changes. Spleen: Interval atrophy of the spleen since prior exam. No focal parenchymal abnormality. Adrenals/Urinary Tract: There is subtle areas of decreased attenuation within the right renal parenchyma noted on delayed imaging, and a somewhat striated pattern suggesting underlying pyelonephritis. Mild mucosal enhancement of the right renal pelvis and proximal right ureter compatible with infection. No evidence of renal abscess. The left kidney is unremarkable. The adrenals and bladder are normal. Stomach/Bowel: No bowel obstruction or ileus. The appendix is well visualized in the right lower quadrant. The distal appendiceal tip measures up to 8 mm in diameter, with no evidence of  associated mural thickening or periappendiceal fat stranding. This is a nonspecific finding. Proximal appendix is normal in caliber with gas throughout the appendiceal lumen. Vascular/Lymphatic: IVC filter at the L3-4 level. No other significant vascular findings. No pathologic adenopathy. Reproductive: Uterus and bilateral adnexa are unremarkable. Other: No free fluid or free intraperitoneal gas. No abdominal wall hernia. Musculoskeletal: No acute or destructive bony abnormalities. Reconstructed images demonstrate no additional findings. IMPRESSION: 1. Subtle striated appearance of the right renal parenchyma on delayed imaging, with mucosal enhancement of the right renal pelvis and proximal right ureter. Findings are consistent with urinary tract infection and pyelonephritis. No evidence of renal abscess. Please correlate with urinalysis. 2. Borderline dilation of the distal appendiceal tip, with no other inflammatory changes to suggest appendicitis. Electronically Signed   By: Sharlet Salina [name]   On: 09/27/2022 17:54   DG Chest Portable 1 View  Result Date: 09/27/2022 CLINICAL DATA:  sepsis EXAM: PORTABLE CHEST 1 VIEW COMPARISON:  Chest x-ray 10/10/2015. FINDINGS: The heart size and mediastinal contours are within normal limits. Both lungs are clear. No visible pleural effusions or pneumothorax. No acute osseous abnormality. Chain sutures in the left mid to upper lung. IMPRESSION: No evidence of acute cardiopulmonary disease. Electronically Signed   By: Feliberto Harts [name]   On: 09/27/2022 16:11    Pending Labs Unresulted Labs (From admission, onward)     Start     Ordered   09/27/22 1502  Blood culture (routine x 2)  BLOOD CULTURE X 2,  R (with STAT occurrences)      09/27/22 1501   09/27/22 1352  Urine Culture  Once,   R        09/27/22 1352            Vitals/Pain Today's Vitals   09/27/22 1347 09/27/22 1453 09/27/22 1612 09/27/22 1740  BP: 92/72 (!) 91/59 93/61 (!) 145/86  Pulse:  98 92 79 81  Resp:  19 17 18   Temp:  99 F (37.2 C) 99.2 F (37.3 C) 97.8 F (36.6 C)  TempSrc:  Oral Oral Oral  SpO2: 100% 99% 100% 100%  PainSc:        Isolation Precautions No active isolations  Medications Medications  vancomycin (VANCOREADY) IVPB 1750 mg/350 mL (has no administration in time range)  lactated ringers bolus 1,000 mL (0 mLs Intravenous Stopped 09/27/22 1720)  ceFEPIme (MAXIPIME) 2 g in sodium chloride 0.9 % 100 mL IVPB (0 g Intravenous Stopped 09/27/22 1650)  iohexol (OMNIPAQUE) 350 MG/ML injection 75 mL (75 mLs Intravenous Contrast Given 09/27/22 1721)    Mobility walks     Focused Assessments Fever/sepsis work up  Phylonephritis   R Recommendations: See Admitting Provider Note  Report given to:   Additional Notes: PT AOX4, walky talky, continent, lovely, HX of cancer

## 2022-09-27 NOTE — ED Provider Notes (Signed)
[place] EMERGENCY DEPARTMENT AT [facility] Provider Note  CSN: [id number] Arrival date & time: 09/27/22 1303  Chief Complaint(s) Fever  HPI Lynn [name] is a 57 y.o. female with PMH AL L status post stem cell transplant and graft-versus-host disease, T2DM, HTN, Sweet syndrome, pulmonary embolism in 2013 not currently on anticoagulation who presents emergency department for evaluation of fever and dysuria.  Patient states that symptoms began last night with fever Tmax 101.7 and chills.  Started to develop right lower quadrant pain this morning and went to urgent care who found the patient to have an infected urinalysis and gave her a single dose of Rocephin and sent her home on Levaquin.  They did draw labs at that time and found the patient to have a leukocytosis greater than 20 and asked her to come to the emergency department for further evaluation.  Here in the emergency room, patient arrives hypotensive and tachycardic but is alert and oriented answering questions appropriately.  Did take Tylenol prior to arrival.  Of note, patient also had 6 teeth extracted recently and was not on antibiotic prophylaxis prior.   Past Medical History Past Medical History:  Diagnosis Date   ALL (acute lymphoblastic leukemia) (HCC)    Anemia    Arthritis    Brain bleed (HCC)    07/04/11   Diabetes mellitus without complication (HCC)    Headache(784.0)    Hypertension    Leukemia (HCC)    Pneumonia    Pulmonary embolism (HCC)    06/22/11   Sweet's syndrome    Thrush of mouth and esophagus (HCC) 04/09/2014   Patient Active Problem List   Diagnosis Date Noted   CKD (chronic kidney disease), stage III (HCC) 06/03/2016   Headache 11/16/2015   Blurry vision, bilateral 10/07/2015   Chronic headaches 05/06/2015   Dehydration 05/21/2014   Prerenal renal failure 05/21/2014   Thrush of mouth and esophagus (HCC) 04/09/2014   Chronic graft-versus-host disease (HCC) 04/09/2014   Leukopenia  01/08/2014   S/P allogeneic bone marrow transplant (HCC) 01/08/2014   Asthma, chronic 12/13/2013   Pulmonary infiltrates 12/13/2013   Cryptogenic organizing pneumonia 08/27/2013   BOOP (bronchiolitis obliterans with organizing pneumonia) (HCC) 08/08/2013   Insulin dependent type 2 diabetes mellitus, uncontrolled (HCC) 07/30/2013   HCAP (healthcare-associated pneumonia) 06/10/2013   History of pulmonary embolism 01/11/2013   Greenfield filter in place 01/11/2013   Port-A-Cath in place 01/09/2013   Right thyroid nodule 07/09/2012   Fever 05/13/2012   History of peripheral stem cell transplant (HCC) 01/12/2012   Transfusion reaction 11/27/2011   Acute lymphoblastic leukemia in remission (HCC) 08/21/2011   Subdural hemorrhage (HCC) 07/26/2011   Essential hypertension 07/06/2011   Thrombocytopenia (HCC) 06/17/2011   Anemia in neoplastic disease 06/17/2011   Hilar lymphadenopathy 06/17/2011   Home Medication(s) Prior to Admission medications   Medication Sig Start Date End Date Taking? Authorizing Provider  ACCU-CHEK AVIVA PLUS test strip USE TO TEST BLOOD GLUCOSE UP TO 2 TIMES DAILY 10/12/16   Reather Littler, MD  acetaminophen (TYLENOL) 500 MG tablet Take 1,000 mg by mouth every 6 (six) hours as needed for moderate pain.    [provider]  acyclovir (ZOVIRAX) 800 MG tablet Take 1 tablet (800 mg total) by mouth 2 (two) times daily. . 08/27/13   Chism, Onalee Hua, MD  ADVAIR DISKUS 500-50 MCG/DOSE AEPB Inhale 1 puff into the lungs daily as needed (shortness of breath).  05/29/16   [provider]  amitriptyline (ELAVIL) 10 MG  tablet TAKE 2 TABLETS BY MOUTH AT BEDTIME 03/15/18   Anson Fret, MD  cetirizine (ZYRTEC) 10 MG tablet Take 1 tablet (10 mg total) by mouth daily. Patient taking differently: Take 10 mg by mouth daily as needed for allergies.  03/07/14   Charm Rings, MD  Elastic Bandages & Supports (MEDICAL COMPRESSION SOCKS) MISC Please dispense one pair. 05/13/15   Felicie Morn, NP  gabapentin (NEURONTIN) 300 MG capsule TAKE ONE CAPSULE BY MOUTH THREE TIMES DAILY Patient taking differently: Take 300 mg by mouth daily as needed (pain).  02/06/17   Nilda Riggs, NP  ibuprofen (ADVIL,MOTRIN) 200 MG tablet Take 600-800 mg by mouth every 6 (six) hours as needed for moderate pain.    [provider]  KLOR-CON M20 20 MEQ tablet TAKE 1 TABLET (20 MEQ TOTAL) BY MOUTH DAILY. Patient taking differently: Take 20 mEq by mouth at bedtime.  05/01/17   Reather Littler, MD  LORazepam (ATIVAN) 1 MG tablet Take 1 mg by mouth every 4 (four) hours as needed for anxiety.  02/06/14   [provider]  Magnesium 250 MG TABS Take 250 mg by mouth daily.    [provider]  Multiple Vitamin (MULTIVITAMIN) tablet Take 1 tablet by mouth daily.    [provider]  NOVOFINE PLUS 32G X 4 MM MISC  05/31/14   [provider]  NOVOLOG FLEXPEN 100 UNIT/ML FlexPen INJECT 16 UNITS WITH EACH MEAL PLUS 18-20 EXTRA UNITS IF SUGAR IS OVER 250 Patient not taking: Reported on 10/25/2017 09/06/15   Reather Littler, MD  ondansetron (ZOFRAN ODT) 4 MG disintegrating tablet Take 1 tablet (4 mg total) by mouth every 8 (eight) hours as needed for nausea or vomiting. 04/29/16   Rhunette Croft, Ankit, MD  OZEMPIC 0.25 or 0.5 MG/DOSE SOPN INJECT 0.5 MG INTO THE SKIN ONCE A WEEK. Patient not taking: Reported on 10/25/2017 07/29/17   Reather Littler, MD  pravastatin (PRAVACHOL) 20 MG tablet TAKE 1 TABLET (20 MG TOTAL) BY MOUTH DAILY. Patient not taking: Reported on 10/25/2017 07/09/17   Reather Littler, MD  sulfamethoxazole-trimethoprim (BACTRIM DS,SEPTRA DS) 800-160 MG per tablet Take 1 tablet by mouth 3 (three) times a week. Monday,Wednesday, and Friday 06/19/14   Artis Delay, MD  SUMAtriptan (IMITREX) 100 MG tablet Take 1 tablet (100 mg total) by mouth once as needed for up to 1 dose. May repeat in 2 hours if headache persists or recurs. Patient taking differently: Take 100 mg by mouth at bedtime. May  repeat in 2 hours if headache persists or recurs. 03/20/17   Anson Fret, MD                                                                                                                                    Past Surgical History Past Surgical History:  Procedure Laterality Date   BONE MARROW TRANSPLANT     CESAREAN SECTION  2000  CRANIOTOMY  07/04/11   EYE SURGERY Right 2005   repair crossed eye   FOOT SURGERY     greenfield filter     LUNG BIOPSY  06/19/13   Family History Family History  Problem Relation Age of Onset   Pancreatic cancer Father    Cancer Mother        colon   Pancreatic cancer Mother    Asthma Son    Hypertension Sister    Diabetes Neg Hx    Heart disease Neg Hx    Breast cancer Neg Hx     Social History Social History   Tobacco Use   Smoking status: Never   Smokeless tobacco: Never  Vaping Use   Vaping status: Never Used  Substance Use Topics   Alcohol use: No   Drug use: No   Allergies Tegaderm ag mesh [silver]  Review of Systems Review of Systems  Constitutional:  Positive for chills and fever.  Gastrointestinal:  Positive for abdominal pain.  Genitourinary:  Positive for dysuria.    Physical Exam Vital Signs  I have reviewed the triage vital signs BP (!) 91/59 (BP Location: Left Arm)   Pulse 92   Temp 99 F (37.2 C) (Oral)   Resp 19   LMP 08/09/2011   SpO2 99%   Physical Exam Vitals and nursing note reviewed.  Constitutional:      General: She is not in acute distress.    Appearance: She is well-developed. She is ill-appearing.  HENT:     Head: Normocephalic and atraumatic.  Eyes:     Conjunctiva/sclera: Conjunctivae normal.  Cardiovascular:     Rate and Rhythm: Normal rate and regular rhythm.     Heart sounds: No murmur heard. Pulmonary:     Effort: Pulmonary effort is normal. No respiratory distress.     Breath sounds: Normal breath sounds.  Abdominal:     Palpations: Abdomen is soft.     Tenderness: There is  abdominal tenderness.  Musculoskeletal:        General: No swelling.     Cervical back: Neck supple.  Skin:    General: Skin is warm and dry.     Capillary Refill: Capillary refill takes less than 2 seconds.  Neurological:     Mental Status: She is alert.  Psychiatric:        Mood and Affect: Mood normal.     ED Results and Treatments Labs (all labs ordered are listed, but only abnormal results are displayed) Labs Reviewed  COMPREHENSIVE METABOLIC PANEL - Abnormal; Notable for the following components:      Result Value   CO2 21 (*)    Glucose, Bld 297 (*)    Creatinine, Ser 1.69 (*)    Calcium 8.4 (*)    Albumin 2.9 (*)    AST 56 (*)    GFR, Estimated 35 (*)    All other components within normal limits  CBC WITH DIFFERENTIAL/PLATELET - Abnormal; Notable for the following components:   WBC 21.3 (*)    RBC 3.49 (*)    Hemoglobin 10.8 (*)    HCT 33.3 (*)    Neutro Abs 19.0 (*)    Abs Immature Granulocytes 0.30 (*)    All other components within normal limits  URINALYSIS, W/ REFLEX TO CULTURE (INFECTION SUSPECTED) - Abnormal; Notable for the following components:   Color, Urine AMBER (*)    APPearance CLOUDY (*)    Glucose, UA 150 (*)    Bilirubin Urine MODERATE (*)  Ketones, ur 5 (*)    Protein, ur 100 (*)    Leukocytes,Ua LARGE (*)    Bacteria, UA FEW (*)    Non Squamous Epithelial 0-5 (*)    All other components within normal limits  I-STAT CG4 LACTIC ACID, ED - Abnormal; Notable for the following components:   Lactic Acid, Venous 2.3 (*)    All other components within normal limits  CULTURE, BLOOD (ROUTINE X 2)  CULTURE, BLOOD (ROUTINE X 2)  URINE CULTURE  I-STAT CG4 LACTIC ACID, ED                                                                                                                          Radiology No results found.  Pertinent labs & imaging results that were available during my care of the patient were reviewed by me and considered in my  medical decision making (see MDM for details).  Medications Ordered in ED Medications  lactated ringers bolus 1,000 mL (has no administration in time range)  ceFEPIme (MAXIPIME) 2 g in sodium chloride 0.9 % 100 mL IVPB (has no administration in time range)  vancomycin (VANCOREADY) IVPB 1750 mg/350 mL (has no administration in time range)                                                                                                                                     Procedures .Critical Care  Performed by: Glendora Score, MD Authorized by: Glendora Score, MD   Critical care provider statement:    Critical care time (minutes):  30   Critical care was necessary to treat or prevent imminent or life-threatening deterioration of the following conditions:  Sepsis   Critical care was time spent personally by me on the following activities:  Development of treatment plan with patient or surrogate, discussions with consultants, evaluation of patient's response to treatment, examination of patient, ordering and review of laboratory studies, ordering and review of radiographic studies, ordering and performing treatments and interventions, pulse oximetry, re-evaluation of patient's condition and review of old charts   (including critical care time)  Medical Decision Making / ED Course   This patient presents to the ED for concern of fever, chills, dysuria, this involves an extensive number of treatment options, and is a complaint that carries with it a high risk of complications and morbidity.  The differential diagnosis includes UTI, pyelonephritis, intra-abdominal  infection, sepsis, bacteremia, appendicitis, obstruction  MDM: Patient seen emergency room for evaluation of fever, chills and dysuria.  Physical exam with some mild tenderness in the right lower quadrant and over the suprapubic region but cardiopulmonary exam largely unremarkable.  Laboratory evaluation with a leukocytosis to 21.3,  creatinine 1.69, initial lactic 2.3.  Fluid resuscitation begun broad-spectrum antibiotics initiated as patient does meet SIRS criteria.  Hypotension improving with fluids.  Urinalysis is concerning for infection with large leuk esterase, greater than 50 white blood cells and few bacteria.  Urine sent for culture.  Blood cultures obtained.  Chest x-ray unremarkable.  CT abdomen pelvis concerning for possible pyelonephritis.  On my reevaluation, patient continuing to have rigors and Tylenol again ordered.  She will require hospital admission for complicated UTI and possible bacteremia.  Do suspect source is urinary but given her recent dental extractions, bacteremia/endocarditis remains in the differential.  Patient then admitted to the hospital service.   Additional history obtained:  -External records from outside source obtained and reviewed including: Chart review including previous notes, labs, imaging, consultation notes   Lab Tests: -I ordered, reviewed, and interpreted labs.   The pertinent results include:   Labs Reviewed  COMPREHENSIVE METABOLIC PANEL - Abnormal; Notable for the following components:      Result Value   CO2 21 (*)    Glucose, Bld 297 (*)    Creatinine, Ser 1.69 (*)    Calcium 8.4 (*)    Albumin 2.9 (*)    AST 56 (*)    GFR, Estimated 35 (*)    All other components within normal limits  CBC WITH DIFFERENTIAL/PLATELET - Abnormal; Notable for the following components:   WBC 21.3 (*)    RBC 3.49 (*)    Hemoglobin 10.8 (*)    HCT 33.3 (*)    Neutro Abs 19.0 (*)    Abs Immature Granulocytes 0.30 (*)    All other components within normal limits  URINALYSIS, W/ REFLEX TO CULTURE (INFECTION SUSPECTED) - Abnormal; Notable for the following components:   Color, Urine AMBER (*)    APPearance CLOUDY (*)    Glucose, UA 150 (*)    Bilirubin Urine MODERATE (*)    Ketones, ur 5 (*)    Protein, ur 100 (*)    Leukocytes,Ua LARGE (*)    Bacteria, UA FEW (*)    Non  Squamous Epithelial 0-5 (*)    All other components within normal limits  I-STAT CG4 LACTIC ACID, ED - Abnormal; Notable for the following components:   Lactic Acid, Venous 2.3 (*)    All other components within normal limits  CULTURE, BLOOD (ROUTINE X 2)  CULTURE, BLOOD (ROUTINE X 2)  URINE CULTURE  I-STAT CG4 LACTIC ACID, ED        Imaging Studies ordered: I ordered imaging studies including chest x-ray, CTAP I independently visualized and interpreted imaging. I agree with the radiologist interpretation   Medicines ordered and prescription drug management: Meds ordered this encounter  Medications   lactated ringers bolus 1,000 mL   ceFEPIme (MAXIPIME) 2 g in sodium chloride 0.9 % 100 mL IVPB    Order Specific Question:   Antibiotic Indication:    Answer:   Sepsis   vancomycin (VANCOREADY) IVPB 1750 mg/350 mL    Order Specific Question:   Indication:    Answer:   Sepsis    -I have reviewed the patients home medicines and have made adjustments as needed  Critical interventions Fluids, antibiotics  Cardiac Monitoring: The patient was maintained on a cardiac monitor.  I personally viewed and interpreted the cardiac monitored which showed an underlying rhythm of: NSR, sinus tachycardia  Social Determinants of Health:  Factors impacting patients care include: none   Reevaluation: After the interventions noted above, I reevaluated the patient and found that they have :improved  Co morbidities that complicate the patient evaluation  Past Medical History:  Diagnosis Date   ALL (acute lymphoblastic leukemia) (HCC)    Anemia    Arthritis    Brain bleed (HCC)    07/04/11   Diabetes mellitus without complication (HCC)    Headache(784.0)    Hypertension    Leukemia (HCC)    Pneumonia    Pulmonary embolism (HCC)    06/22/11   Sweet's syndrome    Thrush of mouth and esophagus (HCC) 04/09/2014      Dispostion: I considered admission for this patient, and given  pyelonephritis and persistent rigors, patient require hospital admission due to concern for possible bacteremia     Final Clinical Impression(s) / ED Diagnoses Final diagnoses:  None     @PCDICTATION @    Glendora Score, MD 09/28/22 1114

## 2022-09-27 NOTE — ED Notes (Signed)
ED TO INPATIENT HANDOFF REPORT  ED Nurse Name and Phone #: Scheryl Marten RN, [phone]  S Name/Age/Gender Lynn [name] 57 y.o. female Room/Bed: 034C/034C  Code Status   Code Status: Full Code  Home/SNF/Other Home Patient oriented to: self, place, time, and situation Is this baseline? Yes   Triage Complete: Triage complete  Chief Complaint Pyelonephritis [N12]  Triage Note Pt to ED POV from UC. Pt states she began having chills last night. Pt states she began having RLQ abd pain radiating to lower back / flank. Pt states she woke up with a fever this morning. Pt states she was feeling dizzy / fatigued this morning. Pt states she had lab work done at [facility] and was dx with kidney infection and was sent by UC d/t elevated WBC. Pt has hx of leukemia but has been in remission since 2014 but still takes immunosuppressants daily. Pt states she also had oral surgery last week to have 6 teeth removed. Pt states UC gave her 1000 of tylenol PTA to ED and was also given a "shot" of antibiotics.    Allergies Allergies  Allergen Reactions   Tegaderm Ag Mesh [Silver] Rash    Level of Care/Admitting Diagnosis ED Disposition     ED Disposition  Admit   Condition  --   Comment  Hospital Area: [facility] [[id number]]  Level of Care: Telemetry Medical [104]  May admit patient to [facility] or [facility] if equivalent level of care is available:: No  Covid Evaluation: Asymptomatic - no recent exposure (last 10 days) testing not required  Diagnosis: Pyelonephritis [595638]  Admitting Physician: Tereasa Coop [[id number]]  Attending Physician: Tereasa Coop [[id number]]  Certification:: I certify this patient will need inpatient services for at least 2 midnights  Expected Medical Readiness: 10/02/2022          B Medical/Surgery History Past Medical History:  Diagnosis Date   ALL (acute lymphoblastic leukemia) (HCC)    Anemia    Arthritis    Brain bleed (HCC)    07/04/11    Diabetes mellitus without complication (HCC)    Headache(784.0)    Hypertension    Leukemia (HCC)    Pneumonia    Pulmonary embolism (HCC)    06/22/11   Sweet's syndrome    Thrush of mouth and esophagus (HCC) 04/09/2014   Past Surgical History:  Procedure Laterality Date   BONE MARROW TRANSPLANT     CESAREAN SECTION  2000   CRANIOTOMY  07/04/11   EYE SURGERY Right 2005   repair crossed eye   FOOT SURGERY     greenfield filter     LUNG BIOPSY  06/19/13     A IV Location/Drains/Wounds Patient Lines/Drains/Airways Status     Active Line/Drains/Airways     Name Placement date Placement time Site Days   Implanted Port Right Chest --  --  Chest  --   Implanted Port Right Chest --  --  Chest  --   Implanted Port 07/30/11 Right Chest 07/30/11  --  Chest  4077   Peripheral IV 10/25/17 Right Antecubital 10/25/17  1031  Antecubital  1798   Peripheral IV 09/27/22 20 G 1" Anterior;Proximal;Right Antecubital 09/27/22  1547  Antecubital  less than 1   CVC Double Lumen 09/26/11 Left 09/26/11  --  -- 4019            Intake/Output Last 24 hours No intake or output data in the 24 hours ending 09/27/22 2124  Labs/Imaging Results  for orders placed or performed during the hospital encounter of 09/27/22 (from the past 48 hour(s))  Comprehensive metabolic panel     Status: Abnormal   Collection Time: 09/27/22  1:52 PM  Result Value Ref Range   Sodium 136 135 - 145 mmol/L   Potassium 3.5 3.5 - 5.1 mmol/L   Chloride 102 98 - 111 mmol/L   CO2 21 (L) 22 - 32 mmol/L   Glucose, Bld 297 (H) 70 - 99 mg/dL    Comment: Glucose reference range applies only to samples taken after fasting for at least 8 hours.   BUN 13 6 - 20 mg/dL   Creatinine, Ser 4.09 (H) 0.44 - 1.00 mg/dL   Calcium 8.4 (L) 8.9 - 10.3 mg/dL   Total Protein 6.7 6.5 - 8.1 g/dL   Albumin 2.9 (L) 3.5 - 5.0 g/dL   AST 56 (H) 15 - 41 U/L   ALT 28 0 - 44 U/L   Alkaline Phosphatase 83 38 - 126 U/L   Total Bilirubin 0.5 0.3 - 1.2  mg/dL   GFR, Estimated 35 (L) >60 mL/min    Comment: (NOTE) Calculated using the CKD-EPI Creatinine Equation (2021)    Anion gap 13 5 - 15    Comment: Performed at [facility], [address] [place], [place] [address]  CBC with Differential     Status: Abnormal   Collection Time: 09/27/22  1:52 PM  Result Value Ref Range   WBC 21.3 (H) 4.0 - 10.5 K/uL   RBC 3.49 (L) 3.87 - 5.11 MIL/uL   Hemoglobin 10.8 (L) 12.0 - 15.0 g/dL   HCT 47.8 (L) 29.5 - 62.1 %   MCV 95.4 80.0 - 100.0 fL   MCH 30.9 26.0 - 34.0 pg   MCHC 32.4 30.0 - 36.0 g/dL   RDW 30.8 65.7 - 84.6 %   Platelets 331 150 - 400 K/uL   nRBC 0.0 0.0 - 0.2 %   Neutrophils Relative % 90 %   Neutro Abs 19.0 (H) 1.7 - 7.7 K/uL   Lymphocytes Relative 6 %   Lymphs Abs 1.3 0.7 - 4.0 K/uL   Monocytes Relative 3 %   Monocytes Absolute 0.7 0.1 - 1.0 K/uL   Eosinophils Relative 0 %   Eosinophils Absolute 0.0 0.0 - 0.5 K/uL   Basophils Relative 0 %   Basophils Absolute 0.0 0.0 - 0.1 K/uL   Immature Granulocytes 1 %   Abs Immature Granulocytes 0.30 (H) 0.00 - 0.07 K/uL    Comment: Performed at [facility], [address] [place], [place] [address]  Urinalysis, w/ Reflex to Culture (Infection Suspected) -Urine, Clean Catch     Status: Abnormal   Collection Time: 09/27/22  1:52 PM  Result Value Ref Range   Specimen Source URINE, CLEAN CATCH    Color, Urine AMBER (A) YELLOW    Comment: BIOCHEMICALS MAY BE AFFECTED BY COLOR   APPearance CLOUDY (A) CLEAR   Specific Gravity, Urine 1.027 1.005 - 1.030   pH 5.0 5.0 - 8.0   Glucose, UA 150 (A) NEGATIVE mg/dL   Hgb urine dipstick NEGATIVE NEGATIVE   Bilirubin Urine MODERATE (A) NEGATIVE   Ketones, ur 5 (A) NEGATIVE mg/dL   Protein, ur 284 (A) NEGATIVE mg/dL   Nitrite NEGATIVE NEGATIVE   Leukocytes,Ua LARGE (A) NEGATIVE   RBC / HPF 0-5 0 - 5 RBC/hpf   WBC, UA >50 0 - 5 WBC/hpf    Comment:  Reflex urine culture not performed if WBC <=10, OR if Squamous  epithelial cells >5. If Squamous epithelial cells >5 suggest recollection.    Bacteria, UA FEW (A) NONE SEEN   Squamous Epithelial / HPF 0-5 0 - 5 /HPF   WBC Clumps PRESENT    Mucus PRESENT    Non Squamous Epithelial 0-5 (A) NONE SEEN    Comment: Performed at [facility], [address], [place], [place] [address]  I-Stat Lactic Acid, ED     Status: Abnormal   Collection Time: 09/27/22  2:02 PM  Result Value Ref Range   Lactic Acid, Venous 2.3 (HH) 0.5 - 1.9 mmol/L   Comment NOTIFIED PHYSICIAN   I-Stat Lactic Acid, ED     Status: None   Collection Time: 09/27/22  4:18 PM  Result Value Ref Range   Lactic Acid, Venous 1.6 0.5 - 1.9 mmol/L   CT ABDOMEN PELVIS W CONTRAST  Result Date: 09/27/2022 CLINICAL DATA:  Right lower quadrant abdominal pain EXAM: CT ABDOMEN AND PELVIS WITH CONTRAST TECHNIQUE: Multidetector CT imaging of the abdomen and pelvis was performed using the standard protocol following bolus administration of intravenous contrast. RADIATION DOSE REDUCTION: This exam was performed according to the departmental dose-optimization program which includes automated exposure control, adjustment of the mA and/or kV according to patient size and/or use of iterative reconstruction technique. CONTRAST:  75mL OMNIPAQUE IOHEXOL 350 MG/ML SOLN COMPARISON:  09/21/2004 FINDINGS: Lower chest: No acute pleural or parenchymal lung disease. Hepatobiliary: No focal liver abnormality is seen. No gallstones, gallbladder wall thickening, or biliary dilatation. Pancreas: Unremarkable. No pancreatic ductal dilatation or surrounding inflammatory changes. Spleen: Interval atrophy of the spleen since prior exam. No focal parenchymal abnormality. Adrenals/Urinary Tract: There is subtle areas of decreased attenuation within the right renal parenchyma noted on delayed imaging, and a somewhat striated pattern suggesting underlying pyelonephritis. Mild mucosal enhancement of the right renal pelvis and proximal  right ureter compatible with infection. No evidence of renal abscess. The left kidney is unremarkable. The adrenals and bladder are normal. Stomach/Bowel: No bowel obstruction or ileus. The appendix is well visualized in the right lower quadrant. The distal appendiceal tip measures up to 8 mm in diameter, with no evidence of associated mural thickening or periappendiceal fat stranding. This is a nonspecific finding. Proximal appendix is normal in caliber with gas throughout the appendiceal lumen. Vascular/Lymphatic: IVC filter at the L3-4 level. No other significant vascular findings. No pathologic adenopathy. Reproductive: Uterus and bilateral adnexa are unremarkable. Other: No free fluid or free intraperitoneal gas. No abdominal wall hernia. Musculoskeletal: No acute or destructive bony abnormalities. Reconstructed images demonstrate no additional findings. IMPRESSION: 1. Subtle striated appearance of the right renal parenchyma on delayed imaging, with mucosal enhancement of the right renal pelvis and proximal right ureter. Findings are consistent with urinary tract infection and pyelonephritis. No evidence of renal abscess. Please correlate with urinalysis. 2. Borderline dilation of the distal appendiceal tip, with no other inflammatory changes to suggest appendicitis. Electronically Signed   By: Sharlet Salina [name]   On: 09/27/2022 17:54   DG Chest Portable 1 View  Result Date: 09/27/2022 CLINICAL DATA:  sepsis EXAM: PORTABLE CHEST 1 VIEW COMPARISON:  Chest x-ray 10/10/2015. FINDINGS: The heart size and mediastinal contours are within normal limits. Both lungs are clear. No visible pleural effusions or pneumothorax. No acute osseous abnormality. Chain sutures in the left mid to upper lung. IMPRESSION: No evidence of acute cardiopulmonary disease. Electronically Signed   By: Thornton Dales  Yetta Barre [name]   On: 09/27/2022 16:11    Pending Labs Unresulted Labs (From admission, onward)     Start     Ordered    09/28/22 0500  Comprehensive metabolic panel  Tomorrow morning,   R        09/27/22 1919   09/28/22 0500  CBC  Tomorrow morning,   R        09/27/22 1919   09/28/22 0500  Hemoglobin A1c  Tomorrow morning,   R        09/27/22 1919   09/28/22 0500  Lipid panel  Tomorrow morning,   R        09/27/22 1920   09/27/22 1920  HIV Antibody (routine testing w rflx)  (HIV Antibody (Routine testing w reflex) panel)  Once,   R        09/27/22 1919   09/27/22 1502  Blood culture (routine x 2)  BLOOD CULTURE X 2,   R      09/27/22 1501   09/27/22 1352  Urine Culture  Once,   R        09/27/22 1352            Vitals/Pain Today's Vitals   09/27/22 1453 09/27/22 1612 09/27/22 1740 09/27/22 1852  BP: (!) 91/59 93/61 (!) 145/86   Pulse: 92 79 81   Resp: 19 17 18    Temp: 99 F (37.2 C) 99.2 F (37.3 C) 97.8 F (36.6 C) 99.7 F (37.6 C)  TempSrc: Oral Oral Oral Oral  SpO2: 99% 100% 100%   PainSc:        Isolation Precautions No active isolations  Medications Medications  acetaminophen (TYLENOL) tablet 650 mg (has no administration in time range)    Or  acetaminophen (TYLENOL) suppository 650 mg (has no administration in time range)  senna-docusate (Senokot-S) tablet 1 tablet (has no administration in time range)  ondansetron (ZOFRAN) tablet 4 mg (has no administration in time range)    Or  ondansetron (ZOFRAN) injection 4 mg (has no administration in time range)  hydrALAZINE (APRESOLINE) injection 10 mg (has no administration in time range)  cefTRIAXone (ROCEPHIN) 2 g in sodium chloride 0.9 % 100 mL IVPB (has no administration in time range)  lactated ringers infusion ( Intravenous New Bag/Given 09/27/22 2057)  pravastatin (PRAVACHOL) tablet 20 mg (20 mg Oral Given 09/27/22 2106)  ruxolitinib phosphate (JAKAFI) tablet 5 mg (has no administration in time range)  cevimeline (EVOXAC) capsule 30 mg (has no administration in time range)  LORazepam (ATIVAN) tablet 1 mg (has no administration  in time range)  lactated ringers bolus 1,000 mL (0 mLs Intravenous Stopped 09/27/22 1720)  ceFEPIme (MAXIPIME) 2 g in sodium chloride 0.9 % 100 mL IVPB (0 g Intravenous Stopped 09/27/22 1650)  vancomycin (VANCOREADY) IVPB 1750 mg/350 mL (0 mg Intravenous Stopped 09/27/22 2053)  iohexol (OMNIPAQUE) 350 MG/ML injection 75 mL (75 mLs Intravenous Contrast Given 09/27/22 1721)    Mobility walks     Focused Assessments GI/GU   R Recommendations: See Admitting Provider Note  Report given to:   Additional Notes: n/a

## 2022-09-28 DIAGNOSIS — A419 Sepsis, unspecified organism: Secondary | ICD-10-CM | POA: Diagnosis not present

## 2022-09-28 DIAGNOSIS — N390 Urinary tract infection, site not specified: Secondary | ICD-10-CM | POA: Diagnosis not present

## 2022-09-28 LAB — CBC
HCT: 30.6 % — ABNORMAL LOW (ref 36.0–46.0)
Hemoglobin: 10 g/dL — ABNORMAL LOW (ref 12.0–15.0)
MCH: 31.3 pg (ref 26.0–34.0)
MCHC: 32.7 g/dL (ref 30.0–36.0)
MCV: 95.6 fL (ref 80.0–100.0)
Platelets: 287 10*3/uL (ref 150–400)
RBC: 3.2 MIL/uL — ABNORMAL LOW (ref 3.87–5.11)
RDW: 14.4 % (ref 11.5–15.5)
WBC: 22.9 10*3/uL — ABNORMAL HIGH (ref 4.0–10.5)
nRBC: 0 % (ref 0.0–0.2)

## 2022-09-28 LAB — COMPREHENSIVE METABOLIC PANEL
ALT: 27 U/L (ref 0–44)
AST: 40 U/L (ref 15–41)
Albumin: 2.6 g/dL — ABNORMAL LOW (ref 3.5–5.0)
Alkaline Phosphatase: 78 U/L (ref 38–126)
Anion gap: 9 (ref 5–15)
BUN: 11 mg/dL (ref 6–20)
CO2: 24 mmol/L (ref 22–32)
Calcium: 8.3 mg/dL — ABNORMAL LOW (ref 8.9–10.3)
Chloride: 104 mmol/L (ref 98–111)
Creatinine, Ser: 1.31 mg/dL — ABNORMAL HIGH (ref 0.44–1.00)
GFR, Estimated: 47 mL/min — ABNORMAL LOW (ref >60)
Glucose, Bld: 114 mg/dL — ABNORMAL HIGH (ref 70–99)
Potassium: 3.6 mmol/L (ref 3.5–5.1)
Sodium: 137 mmol/L (ref 135–145)
Total Bilirubin: 0.3 mg/dL (ref 0.3–1.2)
Total Protein: 6 g/dL — ABNORMAL LOW (ref 6.5–8.1)

## 2022-09-28 LAB — LIPID PANEL
Cholesterol: 181 mg/dL (ref 0–200)
HDL: 75 mg/dL (ref >40)
LDL Cholesterol: 96 mg/dL (ref 0–99)
Total CHOL/HDL Ratio: 2.4 RATIO
Triglycerides: 50 mg/dL (ref <150)
VLDL: 10 mg/dL (ref 0–40)

## 2022-09-28 LAB — GLUCOSE, CAPILLARY
Glucose-Capillary: 84 mg/dL (ref 70–99)
Glucose-Capillary: 94 mg/dL (ref 70–99)
Glucose-Capillary: 94 mg/dL (ref 70–99)

## 2022-09-28 LAB — HEMOGLOBIN A1C
Hgb A1c MFr Bld: 6.1 % — ABNORMAL HIGH (ref 4.8–5.6)
Mean Plasma Glucose: 128.37 mg/dL

## 2022-09-28 LAB — HIV ANTIBODY (ROUTINE TESTING W REFLEX): HIV Screen 4th Generation wRfx: NONREACTIVE

## 2022-09-28 MED ORDER — INSULIN ASPART 100 UNIT/ML IJ SOLN: 0.0000 [IU] | Freq: Three times a day (TID) | INTRAMUSCULAR | Status: DC

## 2022-09-28 MED ORDER — VENLAFAXINE HCL ER 37.5 MG PO CP24
37.5000 mg | ORAL_CAPSULE | Freq: Every day | ORAL | Status: DC
  Filled 2022-09-28: qty 1

## 2022-09-28 MED ORDER — VENLAFAXINE HCL ER 37.5 MG PO CP24
37.5000 mg | ORAL_CAPSULE | Freq: Every day | ORAL | Status: DC
  Administered 2022-09-28 – 2022-09-29 (×2): 37.5 mg via ORAL
  Filled 2022-09-28 (×2): qty 1

## 2022-09-28 NOTE — Plan of Care (Signed)

## 2022-09-28 NOTE — Progress Notes (Signed)
PROGRESS NOTE  Lynn [name]  [name]:[id number] DOB: 1965/11/26 DOA: 09/27/2022 PCP: Clovis Riley, L.August Saucer, MD   Brief Narrative: Patient is a 57 year old female with history of hypertension, CKD stage II/IIIa, ALL currently in remission and status post autologous stem cell transplant, posttransplant complication with chronic graft-versus-host disease, history of PE as well as intracranial hemorrhage status post external ventricular drain placement, subdural hematoma status post suboccipital craniectomy for decompression in 2013, currently on IVC filter who presented with complaint of chills/fever from home, right lower quadrant abdominal pain radiating to the back and flank.   On presentation, she was hypotensive, tachycardic.  Recent UA done as outpatient once history of UTI.  UA in the ED was history of UTI.  Urine culture sent.  Lab work showed leukocytosis of 21,000, creatinine of 1.6(baseline ranges from 1.2-1.6), lactic acid of 2.3.  Chest x-ray did not show any acute findings.  CT abdomen/pelvis showed changes in the right kidney consistent with pyonephritis, no evidence of abscess.  Patient was admitted for further management of severe sepsis secondary to pyonephritis/UTI.  Started on iv  antibiotics.  Cultures sent  Assessment & Plan:  Principal Problem:   Sepsis secondary to UTI [facility]) Active Problems:   History of ALL (acute lymphoblastic leukemia) (HCC)/P allogenic bone marrow transplant   Essential hypertension   History of pulmonary embolism   Acute pyelonephritis   CKD (chronic kidney disease), stage II   Pyelonephritis   Hyperglycemia   Hyperlipidemia   Generalized anxiety disorder   Xerostomia  Severe sepsis secondary to pyonephritis/UTI: Presented with fever, chills, hypotension, elevated lactate, leukocytosis.  UA suggestive of UTI.  CT abdomen/pelvis showed possible  right-sided pyonephritis.  Started on IV fluids, blood pressure on antibiotics.  Cultures sent.  Leukocytosis  persist.  Currently blood pressure stable.  She is afebrile this morning  History of CKD stage II/IIIa: Baseline creatinine of 1.2-1.6.  Currently kidney function at baseline.  Kidney function improved with IV fluids.  History of ALL: Status post allogenic bone marrow transplantation.  On Jakafi.  Follows with oncology at [facility], Dr. Frances Furbish  History of PE: Not on anticoagulation due to history of intracranial hemorrhage, subdural hematoma.  Currently on IVC filter  Hyperglycemia: A1c of 6.1.  Monitor blood sugars.  Not on any medications at home  Hyperlipidemia: Pravastatin  History of xerostomia: On Evoxac  GAD: On Effexor, Ativan  Hypertension: Takes losartan at home.  Currently at hold due to hypotension, sepsis  Obesity:BMI 37.4       DVT prophylaxis:Place TED hose Start: 09/27/22 2129 SCDs Start: 09/27/22 1920     Code Status: Full Code  Family Communication: None at bedside  Patient status:Inpatient  Patient is from :Home  Anticipated discharge QM:VHQI  Estimated DC date:2-3 days   Consultants: None  Procedures:None  Antimicrobials:  Anti-infectives (From admission, onward)    Start     Dose/Rate Route Frequency Ordered Stop   09/28/22 1500  cefTRIAXone (ROCEPHIN) 2 g in sodium chloride 0.9 % 100 mL IVPB        2 g 200 mL/hr over 30 Minutes Intravenous Every 24 hours 09/27/22 1920 10/05/22 1459   09/27/22 1930  cefTRIAXone (ROCEPHIN) 1 g in sodium chloride 0.9 % 100 mL IVPB  Status:  Discontinued        1 g 200 mL/hr over 30 Minutes Intravenous Every 24 hours 09/27/22 1919 09/27/22 1920   09/27/22 1515  ceFEPIme (MAXIPIME) 2 g in sodium chloride 0.9 % 100 mL IVPB  2 g 200 mL/hr over 30 Minutes Intravenous  Once 09/27/22 1509 09/27/22 1650   09/27/22 1515  vancomycin (VANCOREADY) IVPB 1750 mg/350 mL        1,750 mg 175 mL/hr over 120 Minutes Intravenous  Once 09/27/22 1509 09/27/22 2053       Subjective: Patient seen and  examined at bedside today.  She looks comfortable.  Lying in bed.  No fever or chills this morning.  Blood pressure stable.  She denies any back pain or dysuria.  Objective: Vitals:   09/27/22 2219 09/27/22 2239 09/27/22 2314 09/28/22 0420  BP: (!) 147/91 125/69  98/66  Pulse: 87 (!) 112  84  Resp: 17 16  18   Temp: 99.2 F (37.3 C) 98.2 F (36.8 C)  98.3 F (36.8 C)  TempSrc: Oral Oral  Oral  SpO2: 100% 100%  98%  Weight:   108.5 kg   Height:   5\' 7"  (1.702 m)    No intake or output data in the 24 hours ending 09/28/22 0805 Filed Weights   09/27/22 2314  Weight: 108.5 kg    Examination:  General exam: Overall comfortable, not in distress,obese HEENT: PERRL Respiratory system:  no wheezes or crackles  Cardiovascular system: S1 & S2 heard, RRR.  Gastrointestinal system: Abdomen is nondistended, soft and nontender.  No costovertebral angle tenderness Central nervous system: Alert and oriented Extremities: No edema, no clubbing ,no cyanosis Skin: No rashes, no ulcers,no icterus     Data Reviewed: I have personally reviewed following labs and imaging studies  CBC: Recent Labs  Lab 09/27/22 1352 09/28/22 0222  WBC 21.3* 22.9*  NEUTROABS 19.0*  --   HGB 10.8* 10.0*  HCT 33.3* 30.6*  MCV 95.4 95.6  PLT 331 287   Basic Metabolic Panel: Recent Labs  Lab 09/27/22 1352 09/28/22 0222  NA 136 137  K 3.5 3.6  CL 102 104  CO2 21* 24  GLUCOSE 297* 114*  BUN 13 11  CREATININE 1.69* 1.31*  CALCIUM 8.4* 8.3*     No results found for this or any previous visit (from the past 240 hour(s)).   Radiology Studies: CT ABDOMEN PELVIS W CONTRAST  Result Date: 09/27/2022 CLINICAL DATA:  Right lower quadrant abdominal pain EXAM: CT ABDOMEN AND PELVIS WITH CONTRAST TECHNIQUE: Multidetector CT imaging of the abdomen and pelvis was performed using the standard protocol following bolus administration of intravenous contrast. RADIATION DOSE REDUCTION: This exam was performed  according to the departmental dose-optimization program which includes automated exposure control, adjustment of the mA and/or kV according to patient size and/or use of iterative reconstruction technique. CONTRAST:  75mL OMNIPAQUE IOHEXOL 350 MG/ML SOLN COMPARISON:  09/21/2004 FINDINGS: Lower chest: No acute pleural or parenchymal lung disease. Hepatobiliary: No focal liver abnormality is seen. No gallstones, gallbladder wall thickening, or biliary dilatation. Pancreas: Unremarkable. No pancreatic ductal dilatation or surrounding inflammatory changes. Spleen: Interval atrophy of the spleen since prior exam. No focal parenchymal abnormality. Adrenals/Urinary Tract: There is subtle areas of decreased attenuation within the right renal parenchyma noted on delayed imaging, and a somewhat striated pattern suggesting underlying pyelonephritis. Mild mucosal enhancement of the right renal pelvis and proximal right ureter compatible with infection. No evidence of renal abscess. The left kidney is unremarkable. The adrenals and bladder are normal. Stomach/Bowel: No bowel obstruction or ileus. The appendix is well visualized in the right lower quadrant. The distal appendiceal tip measures up to 8 mm in diameter, with no evidence of associated mural thickening or periappendiceal  fat stranding. This is a nonspecific finding. Proximal appendix is normal in caliber with gas throughout the appendiceal lumen. Vascular/Lymphatic: IVC filter at the L3-4 level. No other significant vascular findings. No pathologic adenopathy. Reproductive: Uterus and bilateral adnexa are unremarkable. Other: No free fluid or free intraperitoneal gas. No abdominal wall hernia. Musculoskeletal: No acute or destructive bony abnormalities. Reconstructed images demonstrate no additional findings. IMPRESSION: 1. Subtle striated appearance of the right renal parenchyma on delayed imaging, with mucosal enhancement of the right renal pelvis and proximal right  ureter. Findings are consistent with urinary tract infection and pyelonephritis. No evidence of renal abscess. Please correlate with urinalysis. 2. Borderline dilation of the distal appendiceal tip, with no other inflammatory changes to suggest appendicitis. Electronically Signed   By: Sharlet Salina [name]   On: 09/27/2022 17:54   DG Chest Portable 1 View  Result Date: 09/27/2022 CLINICAL DATA:  sepsis EXAM: PORTABLE CHEST 1 VIEW COMPARISON:  Chest x-ray 10/10/2015. FINDINGS: The heart size and mediastinal contours are within normal limits. Both lungs are clear. No visible pleural effusions or pneumothorax. No acute osseous abnormality. Chain sutures in the left mid to upper lung. IMPRESSION: No evidence of acute cardiopulmonary disease. Electronically Signed   By: Feliberto Harts [name]   On: 09/27/2022 16:11    Scheduled Meds:  cevimeline  30 mg Oral TID   pravastatin  20 mg Oral QHS   ruxolitinib phosphate  5 mg Oral BID   venlafaxine XR  37.5 mg Oral Q breakfast   Continuous Infusions:  cefTRIAXone (ROCEPHIN)  IV     lactated ringers 100 mL/hr at 09/28/22 0634     LOS: 1 day   Burnadette Pop, MD [facility] P8/29/2024, 8:05 AM

## 2022-09-29 DIAGNOSIS — N390 Urinary tract infection, site not specified: Secondary | ICD-10-CM | POA: Diagnosis not present

## 2022-09-29 DIAGNOSIS — A419 Sepsis, unspecified organism: Secondary | ICD-10-CM | POA: Diagnosis not present

## 2022-09-29 LAB — COMPREHENSIVE METABOLIC PANEL
ALT: 20 U/L (ref 0–44)
AST: 26 U/L (ref 15–41)
Albumin: 2.5 g/dL — ABNORMAL LOW (ref 3.5–5.0)
Alkaline Phosphatase: 77 U/L (ref 38–126)
Anion gap: 8 (ref 5–15)
BUN: 11 mg/dL (ref 6–20)
CO2: 23 mmol/L (ref 22–32)
Calcium: 8.2 mg/dL — ABNORMAL LOW (ref 8.9–10.3)
Chloride: 107 mmol/L (ref 98–111)
Creatinine, Ser: 1.17 mg/dL — ABNORMAL HIGH (ref 0.44–1.00)
GFR, Estimated: 54 mL/min — ABNORMAL LOW (ref >60)
Glucose, Bld: 89 mg/dL (ref 70–99)
Potassium: 3.4 mmol/L — ABNORMAL LOW (ref 3.5–5.1)
Sodium: 138 mmol/L (ref 135–145)
Total Bilirubin: 0.5 mg/dL (ref 0.3–1.2)
Total Protein: 5.7 g/dL — ABNORMAL LOW (ref 6.5–8.1)

## 2022-09-29 LAB — GLUCOSE, CAPILLARY
Glucose-Capillary: 72 mg/dL (ref 70–99)
Glucose-Capillary: 75 mg/dL (ref 70–99)

## 2022-09-29 LAB — URINE CULTURE: Culture: >=100000 — AB

## 2022-09-29 LAB — CBC
HCT: 29.1 % — ABNORMAL LOW (ref 36.0–46.0)
Hemoglobin: 9.5 g/dL — ABNORMAL LOW (ref 12.0–15.0)
MCH: 30.6 pg (ref 26.0–34.0)
MCHC: 32.6 g/dL (ref 30.0–36.0)
MCV: 93.9 fL (ref 80.0–100.0)
Platelets: 286 10*3/uL (ref 150–400)
RBC: 3.1 MIL/uL — ABNORMAL LOW (ref 3.87–5.11)
RDW: 14.6 % (ref 11.5–15.5)
WBC: 15.1 10*3/uL — ABNORMAL HIGH (ref 4.0–10.5)
nRBC: 0 % (ref 0.0–0.2)

## 2022-09-29 MED ORDER — CIPROFLOXACIN HCL 500 MG PO TABS
500.0000 mg | ORAL_TABLET | Freq: Two times a day (BID) | ORAL | Status: DC
  Administered 2022-09-29: 500 mg via ORAL
  Filled 2022-09-29 (×2): qty 1

## 2022-09-29 MED ORDER — CIPROFLOXACIN HCL 500 MG PO TABS: 500.0000 mg | ORAL_TABLET | Freq: Two times a day (BID) | ORAL | 0 refills | Status: AC

## 2022-09-29 NOTE — Plan of Care (Signed)
  Problem: Clinical Measurements: Goal: Ability to maintain clinical measurements within normal limits will improve Outcome: Progressing Goal: Will remain free from infection Outcome: Progressing Goal: Diagnostic test results will improve Outcome: Progressing   Problem: Activity: Goal: Risk for activity intolerance will decrease Outcome: Progressing   Problem: Elimination: Goal: Will not experience complications related to bowel motility Outcome: Progressing Goal: Will not experience complications related to urinary retention Outcome: Progressing   Problem: Pain Managment: Goal: General experience of comfort will improve Outcome: Progressing

## 2022-09-29 NOTE — Progress Notes (Signed)
Pt home medication retured to patient.

## 2022-09-29 NOTE — Discharge Summary (Signed)
Physician Discharge Summary  Lynn [name] ZOX:[id number] DOB: [birth year] DOA: 09/27/2022  PCP: Clovis Riley, L.August Saucer, MD  Admit date: 09/27/2022 Discharge date: 09/29/2022  Admitted From: Home Disposition:  Home  Discharge Condition:Stable CODE STATUS:FULL Diet recommendation: Regular   Brief/Interim Summary: Patient is a 57 year old female with history of hypertension, CKD stage II/IIIa, ALL currently in remission and status post autologous stem cell transplant, posttransplant complication with chronic graft-versus-host disease, history of PE as well as intracranial hemorrhage status post external ventricular drain placement, subdural hematoma status post suboccipital craniectomy for decompression in 2013, currently on IVC filter who presented with complaint of chills/fever from home, right lower quadrant abdominal pain radiating to the back and flank.   On presentation, she was hypotensive, tachycardic.  Recent UA done as outpatient once history of UTI.  UA in the ED was history of UTI.  Urine culture sent.  Lab work showed leukocytosis of 21,000, creatinine of 1.6(baseline ranges from 1.2-1.6), lactic acid of 2.3.  Chest x-ray did not show any acute findings.  CT abdomen/pelvis showed changes in the right kidney consistent with pyonephritis, no evidence of abscess.  Patient was admitted for further management of severe sepsis secondary to pyonephritis/UTI.  Started on iv  antibiotics.  Sepsis physiology has resolved.  She is afebrile since last 2 days.  Urine culture showed staph hemolyticus.  Blood cultures have not shown any growth.  She is medically stable for discharge today with oral antibiotic.  Following problems were addressed during the hospitalization:  Severe sepsis secondary to pyonephritis/UTI: Presented with fever, chills, hypotension, elevated lactate, leukocytosis.  UA suggestive of UTI.  CT abdomen/pelvis showed possible  right-sided pyonephritis.  Started on IV fluids, blood  pressure on antibiotics.  Cultures sent.   Currently blood pressure stable.  She is afebrile since last days, leukocytosis improving.  No chills. Urine culture showed staph hemolyticus, blood cultures have remained no growth till date.  Antibiotic changed to ciprofloxacin.   History of CKD stage II/IIIa: Baseline creatinine of 1.2-1.6.  Currently kidney function at baseline.  Kidney function improved with IV fluids.   History of ALL: Status post allogenic bone marrow transplantation.  On Jakafi.  Follows with oncology at [facility], Dr. Frances Furbish   History of PE: Not on anticoagulation due to history of intracranial hemorrhage, subdural hematoma.  Currently on IVC filter   Hyperglycemia: A1c of 6.1.  Monitor blood sugars at home.  Not on any medications at home   Hyperlipidemia: Pravastatin   History of xerostomia: On Evoxac   GAD: On Effexor, Ativan   Hypertension: Takes losartan at home.  Currently at hold due to hypotension, sepsis.  Blood pressure has been stable without any medications.   Obesity:BMI 37.4   Discharge Diagnoses:  Principal Problem:   Sepsis secondary to UTI [facility]) Active Problems:   History of ALL (acute lymphoblastic leukemia) (HCC)/P allogenic bone marrow transplant   Essential hypertension   History of pulmonary embolism   Acute pyelonephritis   CKD (chronic kidney disease), stage II   Pyelonephritis   Hyperglycemia   Hyperlipidemia   Generalized anxiety disorder   Xerostomia    Discharge Instructions  Discharge Instructions     Diet general   Complete by: As directed    Discharge instructions   Complete by: As directed    1)Please take prescribed medication as instructed 2)Follow up with your PCP in a week 3)Your blood pressure has been stable without any medication.  We are stopping losartan.  Might need to  restart in the future if your blood pressure goes up.   Increase activity slowly   Complete by: As directed       Allergies as  of 09/29/2022       Reactions   Tegaderm Ag Mesh [silver] Rash        Medication List     STOP taking these medications    Accu-Chek Aviva Plus test strip Generic drug: glucose blood   acyclovir 800 MG tablet Commonly known as: ZOVIRAX   amitriptyline 10 MG tablet Commonly known as: ELAVIL   losartan 50 MG tablet Commonly known as: COZAAR   multivitamin tablet   NovoFine Plus 32G X 4 MM Misc Generic drug: Insulin Pen Needle   NovoLOG FlexPen 100 UNIT/ML FlexPen Generic drug: insulin aspart   Ozempic (0.25 or 0.5 MG/DOSE) 2 MG/1.5ML Sopn Generic drug: Semaglutide(0.25 or 0.5MG /DOS)   sulfamethoxazole-trimethoprim 800-160 MG tablet Commonly known as: BACTRIM DS   SUMAtriptan 100 MG tablet Commonly known as: IMITREX       TAKE these medications    acetaminophen 500 MG tablet Commonly known as: TYLENOL Take 1,000 mg by mouth every 6 (six) hours as needed for moderate pain.   cetirizine 10 MG tablet Commonly known as: ZYRTEC Take 1 tablet (10 mg total) by mouth daily. What changed:  when to take this reasons to take this   cevimeline 30 MG capsule Commonly known as: EVOXAC Take 30 mg by mouth 3 (three) times daily.   ciprofloxacin 500 MG tablet Commonly known as: CIPRO Take 1 tablet (500 mg total) by mouth 2 (two) times daily for 7 days.   Coromega Omega 3 Squeeze Emul Take 1 capsule by mouth daily.   dexamethasone 0.5 MG/5ML elixir Take 0.5 mg by mouth 3 (three) times daily.   diphenhydrAMINE 25 MG tablet Commonly known as: BENADRYL Take 25 mg by mouth at bedtime.   gabapentin 300 MG capsule Commonly known as: NEURONTIN TAKE ONE CAPSULE BY MOUTH THREE TIMES DAILY What changed: when to take this   ibuprofen 200 MG tablet Commonly known as: ADVIL Take 600-800 mg by mouth every 6 (six) hours as needed for moderate pain.   Klor-Con M20 20 MEQ tablet Generic drug: potassium chloride SA TAKE 1 TABLET (20 MEQ TOTAL) BY MOUTH DAILY. What  changed:  how much to take when to take this   LORazepam 1 MG tablet Commonly known as: ATIVAN Take 1 mg by mouth every 4 (four) hours as needed for anxiety.   Magnesium 250 MG Tabs Take 250 mg by mouth daily.   Medical Compression Socks Misc Please dispense one pair.   nystatin cream Commonly known as: MYCOSTATIN Apply 1 Application topically 2 (two) times daily.   ondansetron 4 MG disintegrating tablet Commonly known as: Zofran ODT Take 1 tablet (4 mg total) by mouth every 8 (eight) hours as needed for nausea or vomiting.   Oyster Shell Calcium w/D 500-5 MG-MCG Tabs Take 1 tablet by mouth daily.   pravastatin 20 MG tablet Commonly known as: PRAVACHOL TAKE 1 TABLET (20 MG TOTAL) BY MOUTH DAILY.   ruxolitinib phosphate 5 MG tablet Commonly known as: JAKAFI Take 5 mg by mouth 2 (two) times daily.   venlafaxine XR 37.5 MG 24 hr capsule Commonly known as: EFFEXOR-XR Take 37.5 mg by mouth at bedtime.        Follow-up Information     Clovis Riley, L.August Saucer, MD. Schedule an appointment as soon as possible for a visit in 1 week(s).  Specialty: Family Medicine Contact information: [address] [place] [address] [phone]                Allergies  Allergen Reactions   Tegaderm Ag Mesh [Silver] Rash    Consultations: None   Procedures/Studies: CT ABDOMEN PELVIS W CONTRAST  Result Date: 09/27/2022 CLINICAL DATA:  Right lower quadrant abdominal pain EXAM: CT ABDOMEN AND PELVIS WITH CONTRAST TECHNIQUE: Multidetector CT imaging of the abdomen and pelvis was performed using the standard protocol following bolus administration of intravenous contrast. RADIATION DOSE REDUCTION: This exam was performed according to the departmental dose-optimization program which includes automated exposure control, adjustment of the mA and/or kV according to patient size and/or use of iterative reconstruction technique. CONTRAST:  75mL OMNIPAQUE IOHEXOL 350  MG/ML SOLN COMPARISON:  09/21/2004 FINDINGS: Lower chest: No acute pleural or parenchymal lung disease. Hepatobiliary: No focal liver abnormality is seen. No gallstones, gallbladder wall thickening, or biliary dilatation. Pancreas: Unremarkable. No pancreatic ductal dilatation or surrounding inflammatory changes. Spleen: Interval atrophy of the spleen since prior exam. No focal parenchymal abnormality. Adrenals/Urinary Tract: There is subtle areas of decreased attenuation within the right renal parenchyma noted on delayed imaging, and a somewhat striated pattern suggesting underlying pyelonephritis. Mild mucosal enhancement of the right renal pelvis and proximal right ureter compatible with infection. No evidence of renal abscess. The left kidney is unremarkable. The adrenals and bladder are normal. Stomach/Bowel: No bowel obstruction or ileus. The appendix is well visualized in the right lower quadrant. The distal appendiceal tip measures up to 8 mm in diameter, with no evidence of associated mural thickening or periappendiceal fat stranding. This is a nonspecific finding. Proximal appendix is normal in caliber with gas throughout the appendiceal lumen. Vascular/Lymphatic: IVC filter at the L3-4 level. No other significant vascular findings. No pathologic adenopathy. Reproductive: Uterus and bilateral adnexa are unremarkable. Other: No free fluid or free intraperitoneal gas. No abdominal wall hernia. Musculoskeletal: No acute or destructive bony abnormalities. Reconstructed images demonstrate no additional findings. IMPRESSION: 1. Subtle striated appearance of the right renal parenchyma on delayed imaging, with mucosal enhancement of the right renal pelvis and proximal right ureter. Findings are consistent with urinary tract infection and pyelonephritis. No evidence of renal abscess. Please correlate with urinalysis. 2. Borderline dilation of the distal appendiceal tip, with no other inflammatory changes to suggest  appendicitis. Electronically Signed   By: Sharlet Salina [name]   On: 09/27/2022 17:54   DG Chest Portable 1 View  Result Date: 09/27/2022 CLINICAL DATA:  sepsis EXAM: PORTABLE CHEST 1 VIEW COMPARISON:  Chest x-ray 10/10/2015. FINDINGS: The heart size and mediastinal contours are within normal limits. Both lungs are clear. No visible pleural effusions or pneumothorax. No acute osseous abnormality. Chain sutures in the left mid to upper lung. IMPRESSION: No evidence of acute cardiopulmonary disease. Electronically Signed   By: Feliberto Harts [name]   On: 09/27/2022 16:11      Subjective: Patient seen and examined at bedside today.  Hemodynamically stable for discharge.  No fever or chills.  No abdominal pain or back pain.  Feels ready to go home  Discharge Exam: Vitals:   09/29/22 0027 09/29/22 0803  BP: 113/69 118/68  Pulse: 73 88  Resp: 18 18  Temp: 98.5 F (36.9 C) 98 F (36.7 C)  SpO2: 100% 98%   Vitals:   09/28/22 2020 09/29/22 0027 09/29/22 0500 09/29/22 0803  BP: 120/84 113/69  118/68  Pulse: 84 73  88  Resp:  18 18  18   Temp: 98.4 F (36.9 C) 98.5 F (36.9 C)  98 F (36.7 C)  TempSrc: Oral Oral    SpO2: 100% 100%  98%  Weight:   110.1 kg   Height:        General: Pt is alert, awake, not in acute distress,obese Cardiovascular: RRR, S1/S2 +, no rubs, no gallops Respiratory: CTA bilaterally, no wheezing, no rhonchi Abdominal: Soft, NT, ND, bowel sounds + Extremities: no edema, no cyanosis    The results of significant diagnostics from this hospitalization (including imaging, microbiology, ancillary and laboratory) are listed below for reference.     Microbiology: Recent Results (from the past 240 hour(s))  Urine Culture     Status: Abnormal   Collection Time: 09/27/22  1:52 PM   Specimen: Urine, Random  Result Value Ref Range Status   Specimen Description URINE, RANDOM  Final   Special Requests   Final    NONE Reflexed from [id number] Performed at [facility], [address], [place], [place] [address]    Culture >=100,000 COLONIES/mL STAPHYLOCOCCUS HAEMOLYTICUS (A)  Final   Report Status 09/29/2022 FINAL  Final   Organism ID, Bacteria STAPHYLOCOCCUS HAEMOLYTICUS (A)  Final      Susceptibility   Staphylococcus haemolyticus - MIC*    CIPROFLOXACIN <=0.5 SENSITIVE Sensitive     GENTAMICIN <=0.5 SENSITIVE Sensitive     NITROFURANTOIN <=16 SENSITIVE Sensitive     OXACILLIN 1 RESISTANT Resistant     TETRACYCLINE >=16 RESISTANT Resistant     VANCOMYCIN 1 SENSITIVE Sensitive     TRIMETH/SULFA <=10 SENSITIVE Sensitive     CLINDAMYCIN 0.5 SENSITIVE Sensitive     RIFAMPIN <=0.5 SENSITIVE Sensitive     Inducible Clindamycin NEGATIVE Sensitive     * >=100,000 COLONIES/mL STAPHYLOCOCCUS HAEMOLYTICUS  Blood culture (routine x 2)     Status: None (Preliminary result)   Collection Time: 09/27/22  3:46 PM   Specimen: BLOOD RIGHT ARM  Result Value Ref Range Status   Specimen Description BLOOD RIGHT ARM  Final   Special Requests   Final    BOTTLES DRAWN AEROBIC AND ANAEROBIC Blood Culture results may not be optimal due to an inadequate volume of blood received in culture bottles   Culture   Final    NO GROWTH 2 DAYS Performed at [facility], [address], [place], [place] [address]    Report Status PENDING  Incomplete  Blood culture (routine x 2)     Status: None (Preliminary result)   Collection Time: 09/27/22  4:02 PM   Specimen: BLOOD  Result Value Ref Range Status   Specimen Description BLOOD LEFT ANTECUBITAL  Final   Special Requests   Final    BOTTLES DRAWN AEROBIC AND ANAEROBIC Blood Culture adequate volume   Culture   Final    NO GROWTH 2 DAYS Performed at [facility], [address], [place], [place] [address]    Report Status PENDING  Incomplete     Labs: BNP (last 3 results) No results for input(s): "BNP" in the last 8760 hours. Basic Metabolic Panel: Recent Labs  Lab 09/27/22 1352 09/28/22 0222  09/29/22 0235  NA 136 137 138  K 3.5 3.6 3.4*  CL 102 104 107  CO2 21* 24 23  GLUCOSE 297* 114* 89  BUN 13 11 11   CREATININE 1.69* 1.31* 1.17*  CALCIUM 8.4* 8.3* 8.2*   Liver Function Tests: Recent Labs  Lab 09/27/22 1352 09/28/22 0222  09/29/22 0235  AST 56* 40 26  ALT 28 27 20   ALKPHOS 83 78 77  BILITOT 0.5 0.3 0.5  PROT 6.7 6.0* 5.7*  ALBUMIN 2.9* 2.6* 2.5*   No results for input(s): "LIPASE", "AMYLASE" in the last 168 hours. No results for input(s): "AMMONIA" in the last 168 hours. CBC: Recent Labs  Lab 09/27/22 1352 09/28/22 0222 09/29/22 0235  WBC 21.3* 22.9* 15.1*  NEUTROABS 19.0*  --   --   HGB 10.8* 10.0* 9.5*  HCT 33.3* 30.6* 29.1*  MCV 95.4 95.6 93.9  PLT 331 287 286   Cardiac Enzymes: No results for input(s): "CKTOTAL", "CKMB", "CKMBINDEX", "TROPONINI" in the last 168 hours. BNP: Invalid input(s): "POCBNP" CBG: Recent Labs  Lab 09/28/22 1232 09/28/22 1717 09/28/22 2022 09/29/22 0849  GLUCAP 84 94 94 72   D-Dimer No results for input(s): "DDIMER" in the last 72 hours. Hgb A1c Recent Labs    09/28/22 0222  HGBA1C 6.1*   Lipid Profile Recent Labs    09/28/22 0222  CHOL 181  HDL 75  LDLCALC 96  TRIG 50  CHOLHDL 2.4   Thyroid function studies No results for input(s): "TSH", "T4TOTAL", "T3FREE", "THYROIDAB" in the last 72 hours.  Invalid input(s): "FREET3" Anemia work up No results for input(s): "VITAMINB12", "FOLATE", "FERRITIN", "TIBC", "IRON", "RETICCTPCT" in the last 72 hours. Urinalysis    Component Value Date/Time   COLORURINE AMBER (A) 09/27/2022 1352   APPEARANCEUR CLOUDY (A) 09/27/2022 1352   LABSPEC 1.027 09/27/2022 1352   LABSPEC 1.025 10/07/2015 1039   PHURINE 5.0 09/27/2022 1352   GLUCOSEU 150 (A) 09/27/2022 1352   GLUCOSEU Negative 10/07/2015 1039   HGBUR NEGATIVE 09/27/2022 1352   BILIRUBINUR MODERATE (A) 09/27/2022 1352   BILIRUBINUR Color Interference 10/07/2015 1039   KETONESUR 5 (A) 09/27/2022 1352    PROTEINUR 100 (A) 09/27/2022 1352   UROBILINOGEN 0.2 10/07/2015 1039   NITRITE NEGATIVE 09/27/2022 1352   LEUKOCYTESUR LARGE (A) 09/27/2022 1352   LEUKOCYTESUR Moderate 10/07/2015 1039   Sepsis Labs Recent Labs  Lab 09/27/22 1352 09/28/22 0222 09/29/22 0235  WBC 21.3* 22.9* 15.1*   Microbiology Recent Results (from the past 240 hour(s))  Urine Culture     Status: Abnormal   Collection Time: 09/27/22  1:52 PM   Specimen: Urine, Random  Result Value Ref Range Status   Specimen Description URINE, RANDOM  Final   Special Requests   Final    NONE Reflexed from [id number] Performed at [facility], [address] [place], [place] [address]    Culture >=100,000 COLONIES/mL STAPHYLOCOCCUS HAEMOLYTICUS (A)  Final   Report Status 09/29/2022 FINAL  Final   Organism ID, Bacteria STAPHYLOCOCCUS HAEMOLYTICUS (A)  Final      Susceptibility   Staphylococcus haemolyticus - MIC*    CIPROFLOXACIN <=0.5 SENSITIVE Sensitive     GENTAMICIN <=0.5 SENSITIVE Sensitive     NITROFURANTOIN <=16 SENSITIVE Sensitive     OXACILLIN 1 RESISTANT Resistant     TETRACYCLINE >=16 RESISTANT Resistant     VANCOMYCIN 1 SENSITIVE Sensitive     TRIMETH/SULFA <=10 SENSITIVE Sensitive     CLINDAMYCIN 0.5 SENSITIVE Sensitive     RIFAMPIN <=0.5 SENSITIVE Sensitive     Inducible Clindamycin NEGATIVE Sensitive     * >=100,000 COLONIES/mL STAPHYLOCOCCUS HAEMOLYTICUS  Blood culture (routine x 2)     Status: None (Preliminary result)   Collection Time: 09/27/22  3:46 PM   Specimen: BLOOD RIGHT ARM  Result Value Ref Range Status  Specimen Description BLOOD RIGHT ARM  Final   Special Requests   Final    BOTTLES DRAWN AEROBIC AND ANAEROBIC Blood Culture results may not be optimal due to an inadequate volume of blood received in culture bottles   Culture   Final    NO GROWTH 2 DAYS Performed at [facility], [address], [place], [place] [address]    Report Status PENDING  Incomplete  Blood culture  (routine x 2)     Status: None (Preliminary result)   Collection Time: 09/27/22  4:02 PM   Specimen: BLOOD  Result Value Ref Range Status   Specimen Description BLOOD LEFT ANTECUBITAL  Final   Special Requests   Final    BOTTLES DRAWN AEROBIC AND ANAEROBIC Blood Culture adequate volume   Culture   Final    NO GROWTH 2 DAYS Performed at [facility], [address], [place], [place] [address]    Report Status PENDING  Incomplete    Please note: You were cared for by a hospitalist during your hospital stay. Once you are discharged, your primary care physician will handle any further medical issues. Please note that NO REFILLS for any discharge medications will be authorized once you are discharged, as it is imperative that you return to your primary care physician (or establish a relationship with a primary care physician if you do not have one) for your post hospital discharge needs so that they can reassess your need for medications and monitor your lab values.    Time coordinating discharge: 40 minutes  SIGNED:   Burnadette Pop, MD  [facility] 09/29/2022, 11:12 AM Pager [phone]  If 7PM-7AM, please contact night-coverage [email or web address] Password [email or web address]

## 2022-10-02 LAB — CULTURE, BLOOD (ROUTINE X 2)
Culture: NO GROWTH
Culture: NO GROWTH
Special Requests: ADEQUATE
